# Patient Record
Sex: Female | Born: 1965
Health system: Southern US, Community
[De-identification: ages and names within clinical notes are randomized; demographics above are authoritative.]

## PROBLEM LIST (undated history)

## (undated) DIAGNOSIS — F319 Bipolar disorder, unspecified: Secondary | ICD-10-CM

## (undated) DIAGNOSIS — J449 Chronic obstructive pulmonary disease, unspecified: Secondary | ICD-10-CM

## (undated) DIAGNOSIS — M797 Fibromyalgia: Secondary | ICD-10-CM

## (undated) DIAGNOSIS — I10 Essential (primary) hypertension: Secondary | ICD-10-CM

## (undated) HISTORY — PX: BREAST BIOPSY: SHX20

## (undated) HISTORY — PX: HERNIA REPAIR: SHX51

---

## 2011-11-01 ENCOUNTER — Encounter (HOSPITAL_COMMUNITY): Payer: Self-pay | Admitting: *Deleted

## 2011-11-01 ENCOUNTER — Emergency Department (HOSPITAL_COMMUNITY)
Admission: EM | Admit: 2011-11-01 | Discharge: 2011-11-01 | Disposition: A | Discharge status: Home / Self Care | Payer: Medicaid Other | Attending: Emergency Medicine | Admitting: Emergency Medicine

## 2011-11-01 DIAGNOSIS — F17200 Nicotine dependence, unspecified, uncomplicated: Secondary | ICD-10-CM | POA: Insufficient documentation

## 2011-11-01 DIAGNOSIS — I10 Essential (primary) hypertension: Secondary | ICD-10-CM | POA: Insufficient documentation

## 2011-11-01 DIAGNOSIS — R609 Edema, unspecified: Secondary | ICD-10-CM | POA: Insufficient documentation

## 2011-11-01 DIAGNOSIS — E119 Type 2 diabetes mellitus without complications: Secondary | ICD-10-CM | POA: Insufficient documentation

## 2011-11-01 DIAGNOSIS — Z79899 Other long term (current) drug therapy: Secondary | ICD-10-CM | POA: Insufficient documentation

## 2011-11-01 HISTORY — DX: Essential (primary) hypertension: I10

## 2011-11-01 MED ORDER — LISINOPRIL 10 MG PO TABS
10.0000 mg | ORAL_TABLET | Freq: Once | ORAL | Status: AC
  Administered 2011-11-01: 10 mg via ORAL
  Filled 2011-11-01 (×2): qty 1

## 2011-11-01 MED ORDER — LISINOPRIL 10 MG PO TABS: 10.0000 mg | ORAL_TABLET | Freq: Every day | ORAL | Status: DC

## 2011-11-01 NOTE — ED Notes (Signed)
She had her bp checked at the fire dept and it was high.  She is supposed to take bp med but she cannot afford it and she ahs not had any med since the first of may

## 2011-11-01 NOTE — ED Provider Notes (Signed)
History     CSN: [id number]  Arrival date & time 11/01/11  0059   First MD Initiated Contact with Patient 11/01/11 0158      Chief Complaint  Patient presents with  . Hypertension    (Consider location/radiation/quality/duration/timing/severity/associated sxs/prior treatment) HPI  Patient who states hx of dm II and HTN but who has not been on her HTN medication this month stating "I had to get my diabetes medicine and couldn't afford both this month" presents to ER complaining that she noticed some lower ankle swelling his evening and called the on call nurse for her PCP who learned that she had been off of her BP medication and encouraged her to go to fire station to have BP checked. Patient states at fire station BP was 170/100 and then was told to go to ER. Patient denies any redness or heat of ankles. She denies fevers, chills, CP, SOB, HA, dizziness, changes in urination, abdominal pain or loss of coordination. She states she has prescription of lisinopril but just could not get it filled last month but now has the money to get it filled. She has no other complaints.   Past Medical History  Diagnosis Date  . Hypertension   . Diabetes mellitus     History reviewed. No pertinent past surgical history.  No family history on file.  History  Substance Use Topics  . Smoking status: Current Everyday Smoker  . Smokeless tobacco: Not on file  . Alcohol Use: No    OB History    Grav Para Term Preterm Abortions TAB SAB Ect Mult Living                  Review of Systems  All other systems reviewed and are negative.    Allergies  Latex  Home Medications   Current Outpatient Rx  Name Route Sig Dispense Refill  . ARIPIPRAZOLE 10 MG PO TABS Oral Take 10 mg by mouth daily.    Marland Kitchen LAMOTRIGINE 100 MG PO TABS Oral Take 100 mg by mouth daily.    Marland Kitchen METFORMIN HCL 500 MG PO TABS Oral Take 500 mg by mouth 2 (two) times daily with a meal.    . AMARYL PO Oral Take by mouth.    Marland Kitchen  LISINOPRIL 10 MG PO TABS Oral Take 1 tablet (10 mg total) by mouth daily. 30 tablet 1  . LISINOPRIL PO Oral Take by mouth.      BP 145/95  Pulse 86  Temp(Src) 98.7 F (37.1 C) (Oral)  Resp 18  SpO2 99%  Physical Exam  Nursing note and vitals reviewed. Constitutional: She is oriented to person, place, and time. She appears well-developed and well-nourished. No distress.  HENT:  Head: Normocephalic and atraumatic.  Eyes: Conjunctivae and EOM are normal. Pupils are equal, round, and reactive to light.  Neck: Normal range of motion. Neck supple.  Cardiovascular: Normal rate, regular rhythm, normal heart sounds and intact distal pulses.  Exam reveals no gallop and no friction rub.   No murmur heard. Pulmonary/Chest: Effort normal and breath sounds normal. No respiratory distress. She has no wheezes. She has no rales. She exhibits no tenderness.  Abdominal: Bowel sounds are normal. She exhibits no distension and no mass. There is no tenderness. There is no rebound and no guarding.  Musculoskeletal: Normal range of motion. She exhibits edema. She exhibits no tenderness.       Trace edema of bilateral ankles and feet but no erythema or break in skin.  No TTP of bilateral calfs  Neurological: She is alert and oriented to person, place, and time.  Skin: Skin is warm and dry. No rash noted. She is not diaphoretic. No erythema.  Psychiatric: She has a normal mood and affect.    ED Course  Procedures (including critical care time)  Labs Reviewed - No data to display No results found.   1. Hypertension   2. Peripheral edema       MDM  Mild peripheral edema with no complaints of CP or SOB. No worrisome signs or symptoms for DVT. No suggestion of HTN urgency or emergency. She has an established PCP to follow up with        Drucie Opitz, PA 11/01/11 0354

## 2011-11-01 NOTE — ED Provider Notes (Signed)
Medical screening examination/treatment/procedure(s) were performed by non-physician practitioner and as supervising physician I was immediately available for consultation/collaboration.    Vida Roller, MD 11/01/11 [phone]

## 2011-11-01 NOTE — Discharge Instructions (Signed)
Restart your lisinopril and keep a blood pressure diary to take to your primary care provider in the next 1-2 weeks to further assess your blood pressure management. Elevate you feet/ankles at night to help reduce swelling. You may also consider compression stockings for additional relief. Return to the ER for emergent changing or worsening of symptoms.   How to Take Your Blood Pressure  These instructions are only for electronic home blood pressure machines. You will need:   An automatic or semi-automatic blood pressure machine.   Fresh batteries for the blood pressure machine.  HOW DO I USE THESE TOOLS TO CHECK MY BLOOD PRESSURE?   There are 2 numbers that make up your blood pressure. For example: 120/80.   The first number (120 in our example) is called the "systolic pressure." It is a measure of the pressure in your blood vessels when your heart is pumping blood.   The second number (80 in our example) is called the "diastolic pressure." It is a measure of the pressure in your blood vessels when your heart is resting between beats.   Before you buy a home blood pressure machine, check the size of your arm so you can buy the right size cuff. Here is how to check the size of your arm:   Use a tape measure that shows both inches and centimeters.   Wrap the tape measure around the middle upper part of your arm. You may need someone to help you measure right.   Write down your arm measurement in both inches and centimeters.   To measure your blood pressure right, it is important to have the right size cuff.   If your arm is up to 13 inches (37 to 34 centimeters), get an adult cuff size.   If your arm is 13 to 17 inches (35 to 44 centimeters), get a large adult cuff size.   If your arm is 17 to 20 inches (45 to 52 centimeters), get an adult thigh cuff.   Try to rest or relax for at least 30 minutes before you check your blood pressure.   Do not smoke.   Do not have any drinks with  caffeine, such as:   Pop.   Coffee.   Tea.   Check your blood pressure in a quiet room.   Sit down and stretch out your arm on a table. Keep your arm at about the level of your heart. Let your arm relax.  GETTING BLOOD PRESSURE READINGS  Make sure you remove any tight-fighting clothing from your arm. Wrap the cuff around your upper arm. Wrap it just above the bend, and above where you felt the pulse. You should be able to slip a finger between the cuff and your arm. If you cannot slip a finger in the cuff, it is too tight and should be removed and rewrapped.   Some units requires you to manually pump up the arm cuff.   Automatic units inflate the cuff when you press a button.   Cuff deflation is automatic in both models.   After the cuff is inflated, the unit measures your blood pressure and pulse. The readings are displayed on a monitor. Hold still and breathe normally while the cuff is inflated.   Getting a reading takes less than a minute.   Some models store readings in a memory. Some provide a printout of readings.   Get readings at different times of the day. You should wait at least 5 minutes  between readings. Take readings with you to your next doctor's visit.  Document Released: 04/30/2008 Document Revised: 05/07/2011 Document Reviewed: 04/30/2008 [facility] Patient Information 2012 [place], [place].  Edema Edema is a buildup of fluids. It is most common in the feet, ankles, and legs. This happens more as a person ages. It may affect one or both legs. HOME CARE   Raise (elevate) the legs or ankles above the level of the heart while lying down.   Avoid sitting or standing still for a long time.   Exercise the legs to help the puffiness (swelling) go down.   A low-salt diet may help lessen the puffiness.   Only take medicine as told by your doctor.  GET HELP RIGHT AWAY IF:   You develop shortness of breath or chest pain.   You cannot breathe when you lie down.    You have more puffiness that does not go away with treatment.   You develop pain or redness in the areas that are puffy.   You have a temperature by mouth above 102 F (38.9 C), not controlled by medicine.   You gain 3 lb/1.4 kg or more in 1 day or 5 lb/2.3 kg in a week.  MAKE SURE YOU:   Understand these instructions.   Will watch your condition.   Will get help right away if you are not doing well or get worse.  Document Released: 11/04/2007 Document Revised: 05/07/2011 Document Reviewed: 11/04/2007 [facility] Patient Information 2012 [place], [place].

## 2012-03-01 ENCOUNTER — Encounter (HOSPITAL_COMMUNITY): Payer: Self-pay | Admitting: Emergency Medicine

## 2012-03-01 ENCOUNTER — Emergency Department (HOSPITAL_COMMUNITY)
Admission: EM | Admit: 2012-03-01 | Discharge: 2012-03-02 | Disposition: A | Discharge status: Home / Self Care | Payer: Medicaid Other | Attending: Emergency Medicine | Admitting: Emergency Medicine

## 2012-03-01 DIAGNOSIS — Z91040 Latex allergy status: Secondary | ICD-10-CM | POA: Insufficient documentation

## 2012-03-01 DIAGNOSIS — F17200 Nicotine dependence, unspecified, uncomplicated: Secondary | ICD-10-CM | POA: Insufficient documentation

## 2012-03-01 DIAGNOSIS — I10 Essential (primary) hypertension: Secondary | ICD-10-CM | POA: Insufficient documentation

## 2012-03-01 DIAGNOSIS — R739 Hyperglycemia, unspecified: Secondary | ICD-10-CM

## 2012-03-01 DIAGNOSIS — E119 Type 2 diabetes mellitus without complications: Secondary | ICD-10-CM | POA: Insufficient documentation

## 2012-03-01 DIAGNOSIS — F319 Bipolar disorder, unspecified: Secondary | ICD-10-CM | POA: Insufficient documentation

## 2012-03-01 HISTORY — DX: Bipolar disorder, unspecified: F31.9

## 2012-03-01 LAB — GLUCOSE, CAPILLARY: Glucose-Capillary: 196 mg/dL — ABNORMAL HIGH (ref 70–99)

## 2012-03-01 NOTE — ED Notes (Signed)
Pt alert, arrives from home, c/o High BS, onset was this am, resp even unlabored, skin pwd, pt NIDDM,

## 2012-03-02 LAB — URINALYSIS, ROUTINE W REFLEX MICROSCOPIC
Bilirubin Urine: NEGATIVE
Glucose, UA: NEGATIVE mg/dL
Hgb urine dipstick: NEGATIVE
Ketones, ur: NEGATIVE mg/dL
Leukocytes, UA: NEGATIVE
Nitrite: NEGATIVE
Protein, ur: NEGATIVE mg/dL
Specific Gravity, Urine: 1.018 (ref 1.005–1.030)
Urobilinogen, UA: 0.2 mg/dL (ref 0.0–1.0)
pH: 5.5 (ref 5.0–8.0)

## 2012-03-02 LAB — GLUCOSE, CAPILLARY: Glucose-Capillary: 145 mg/dL — ABNORMAL HIGH (ref 70–99)

## 2012-03-02 LAB — PREGNANCY, URINE: Preg Test, Ur: NEGATIVE

## 2012-03-02 NOTE — ED Notes (Signed)
Pt returns to ED, requesting to be seen, pt undischarged

## 2012-03-02 NOTE — ED Provider Notes (Signed)
History     CSN: [id number]  Arrival date & time 03/01/12  2137   First MD Initiated Contact with Patient 03/02/12 0144      Chief Complaint  Patient presents with  . Hyperglycemia    (Consider location/radiation/quality/duration/timing/severity/associated sxs/prior treatment) HPI Comments: The patient is a known diabetic who currently takes metformin and Amaryl for blood sugar, states has had blood sugars in the 150s but today became lightheaded and dizzy, check her blood sugar was over 330. She states her symptoms have resolved, she has no other complaints including no chest pain, cough, palpitations, shortness of breath, dysuria, diarrhea, rashes, headache, blurred vision, numbness or weakness. Symptoms resolve spontaneously  The history is provided by the patient and a relative.    Past Medical History  Diagnosis Date  . Hypertension   . Diabetes mellitus   . Bipolar 1 disorder     Past Surgical History  Procedure Date  . Cesarean section   . Hernia repair     No family history on file.  History  Substance Use Topics  . Smoking status: Current Every Day Smoker  . Smokeless tobacco: Not on file  . Alcohol Use: No    OB History    Grav Para Term Preterm Abortions TAB SAB Ect Mult Living                  Review of Systems  All other systems reviewed and are negative.    Allergies  Latex  Home Medications   Current Outpatient Rx  Name Route Sig Dispense Refill  . ARIPIPRAZOLE 10 MG PO TABS Oral Take 10 mg by mouth daily.    . AMARYL PO Oral Take 4 mg by mouth daily.     Marland Kitchen LAMOTRIGINE 100 MG PO TABS Oral Take 100 mg by mouth daily.    Marland Kitchen LISINOPRIL 10 MG PO TABS Oral Take 1 tablet (10 mg total) by mouth daily. 30 tablet 1  . METFORMIN HCL 500 MG PO TABS Oral Take 1,000 mg by mouth 2 (two) times daily with a meal.     . PIOGLITAZONE HCL 30 MG PO TABS Oral Take 30 mg by mouth daily.      BP 154/73  Pulse 92  Temp 98.1 F (36.7 C) (Oral)  Resp 20   Ht 5\' 3"  (1.6 m)  Wt 208 lb (94.348 kg)  BMI 36.85 kg/m2  SpO2 99%  LMP 02/13/2012  Physical Exam  Nursing note and vitals reviewed. Constitutional: She appears well-developed and well-nourished. No distress.  HENT:  Head: Normocephalic and atraumatic.  Mouth/Throat: Oropharynx is clear and moist. No oropharyngeal exudate.  Eyes: Conjunctivae normal and EOM are normal. Pupils are equal, round, and reactive to light. Right eye exhibits no discharge. Left eye exhibits no discharge. No scleral icterus.  Neck: Normal range of motion. Neck supple. No JVD present. No thyromegaly present.  Cardiovascular: Normal rate, regular rhythm, normal heart sounds and intact distal pulses.  Exam reveals no gallop and no friction rub.   No murmur heard. Pulmonary/Chest: Effort normal and breath sounds normal. No respiratory distress. She has no wheezes. She has no rales.  Abdominal: Soft. Bowel sounds are normal. She exhibits no distension and no mass. There is no tenderness.  Musculoskeletal: Normal range of motion. She exhibits no edema and no tenderness.  Lymphadenopathy:    She has no cervical adenopathy.  Neurological: She is alert. Coordination normal.       Strength of all 4 extremities, cranial  nerves III through XII intact, sensation to light-touch intact. Each is clear, coordination is intact in all 4 extremities, no truncal ataxia  Skin: Skin is warm and dry. No rash noted. No erythema.  Psychiatric: She has a normal mood and affect. Her behavior is normal.    ED Course  Procedures (including critical care time)  Labs Reviewed  GLUCOSE, CAPILLARY - Abnormal; Notable for the following:    Glucose-Capillary 196 (*)     All other components within normal limits  GLUCOSE, CAPILLARY - Abnormal; Notable for the following:    Glucose-Capillary 145 (*)     All other components within normal limits  URINALYSIS, ROUTINE W REFLEX MICROSCOPIC  PREGNANCY, URINE   No results found.   1.  Hyperglycemia       MDM  Well-appearing, blood sugar is reduced to less than 200, vital signs are normal, check urinalysis for occult infection or pregnancy.  Blood sugar less than 200, urinalysis clean, patient asymptomatic with normal vital signs are stable for discharge.      Vida Roller, MD 03/02/12 Earle Gell

## 2012-06-27 ENCOUNTER — Other Ambulatory Visit: Payer: Self-pay | Admitting: Obstetrics and Gynecology

## 2012-06-27 DIAGNOSIS — Z1231 Encounter for screening mammogram for malignant neoplasm of breast: Secondary | ICD-10-CM

## 2012-07-27 ENCOUNTER — Ambulatory Visit: Payer: Medicaid Other

## 2012-08-02 ENCOUNTER — Ambulatory Visit: Payer: Medicaid Other

## 2012-08-25 ENCOUNTER — Ambulatory Visit
Admission: RE | Admit: 2012-08-25 | Discharge: 2012-08-25 | Disposition: A | Discharge status: Home / Self Care | Payer: Medicare Other | Source: Ambulatory Visit | Attending: Obstetrics and Gynecology | Admitting: Obstetrics and Gynecology

## 2012-08-25 ENCOUNTER — Other Ambulatory Visit: Payer: Self-pay | Admitting: Family Medicine

## 2012-08-25 DIAGNOSIS — Z1231 Encounter for screening mammogram for malignant neoplasm of breast: Secondary | ICD-10-CM

## 2012-08-29 ENCOUNTER — Other Ambulatory Visit: Payer: Self-pay | Admitting: Family Medicine

## 2012-08-29 DIAGNOSIS — R928 Other abnormal and inconclusive findings on diagnostic imaging of breast: Secondary | ICD-10-CM

## 2012-09-07 ENCOUNTER — Ambulatory Visit
Admission: RE | Admit: 2012-09-07 | Discharge: 2012-09-07 | Disposition: A | Discharge status: Home / Self Care | Payer: Medicare Other | Source: Ambulatory Visit | Attending: Family Medicine | Admitting: Family Medicine

## 2012-09-07 ENCOUNTER — Other Ambulatory Visit: Payer: Self-pay | Admitting: Family Medicine

## 2012-09-07 DIAGNOSIS — R928 Other abnormal and inconclusive findings on diagnostic imaging of breast: Secondary | ICD-10-CM

## 2012-12-02 ENCOUNTER — Encounter (HOSPITAL_COMMUNITY): Payer: Self-pay | Admitting: Emergency Medicine

## 2012-12-02 ENCOUNTER — Emergency Department (HOSPITAL_COMMUNITY)
Admission: EM | Admit: 2012-12-02 | Discharge: 2012-12-02 | Disposition: A | Discharge status: Home / Self Care | Payer: Medicare Other | Attending: Emergency Medicine | Admitting: Emergency Medicine

## 2012-12-02 DIAGNOSIS — T4275XA Adverse effect of unspecified antiepileptic and sedative-hypnotic drugs, initial encounter: Secondary | ICD-10-CM | POA: Insufficient documentation

## 2012-12-02 DIAGNOSIS — E119 Type 2 diabetes mellitus without complications: Secondary | ICD-10-CM | POA: Insufficient documentation

## 2012-12-02 DIAGNOSIS — T426X1A Poisoning by other antiepileptic and sedative-hypnotic drugs, accidental (unintentional), initial encounter: Secondary | ICD-10-CM | POA: Insufficient documentation

## 2012-12-02 DIAGNOSIS — F17200 Nicotine dependence, unspecified, uncomplicated: Secondary | ICD-10-CM | POA: Insufficient documentation

## 2012-12-02 DIAGNOSIS — F319 Bipolar disorder, unspecified: Secondary | ICD-10-CM | POA: Insufficient documentation

## 2012-12-02 DIAGNOSIS — I10 Essential (primary) hypertension: Secondary | ICD-10-CM | POA: Insufficient documentation

## 2012-12-02 DIAGNOSIS — Z79899 Other long term (current) drug therapy: Secondary | ICD-10-CM | POA: Insufficient documentation

## 2012-12-02 DIAGNOSIS — R197 Diarrhea, unspecified: Secondary | ICD-10-CM | POA: Insufficient documentation

## 2012-12-02 MED ORDER — FAMOTIDINE 20 MG PO TABS
10.0000 mg | ORAL_TABLET | Freq: Once | ORAL | Status: DC
  Filled 2012-12-02: qty 1

## 2012-12-02 MED ORDER — FAMOTIDINE 10 MG PO TABS: 10.0000 mg | ORAL_TABLET | Freq: Once | ORAL | Status: DC

## 2012-12-02 MED ORDER — PREDNISONE 20 MG PO TABS: 40.0000 mg | ORAL_TABLET | Freq: Once | ORAL | Status: DC

## 2012-12-02 MED ORDER — FAMOTIDINE 20 MG PO TABS
20.0000 mg | ORAL_TABLET | Freq: Once | ORAL | Status: AC
  Administered 2012-12-02: 20 mg via ORAL

## 2012-12-02 MED ORDER — LORATADINE 10 MG PO TABS: 10.0000 mg | ORAL_TABLET | Freq: Once | ORAL | Status: DC

## 2012-12-02 MED ORDER — PREDNISONE 20 MG PO TABS
40.0000 mg | ORAL_TABLET | Freq: Once | ORAL | Status: AC
  Administered 2012-12-02: 40 mg via ORAL
  Filled 2012-12-02: qty 2

## 2012-12-02 MED ORDER — PREDNISONE 20 MG PO TABS: 40.0000 mg | ORAL_TABLET | Freq: Every day | ORAL | Status: DC

## 2012-12-02 MED ORDER — LORATADINE 10 MG PO TABS: 10.0000 mg | ORAL_TABLET | Freq: Every day | ORAL | Status: DC

## 2012-12-02 MED ORDER — FAMOTIDINE 20 MG PO TABS: 20.0000 mg | ORAL_TABLET | Freq: Every day | ORAL | Status: DC

## 2012-12-02 MED ORDER — LORATADINE 10 MG PO TABS
10.0000 mg | ORAL_TABLET | Freq: Once | ORAL | Status: AC
  Administered 2012-12-02: 10 mg via ORAL
  Filled 2012-12-02: qty 1

## 2012-12-02 NOTE — ED Notes (Signed)
Pt c/o rash started month ago.rash burns and itches.VSS

## 2012-12-02 NOTE — ED Provider Notes (Signed)
History  This chart was scribed for non-physician practitioner working with Courtney Chick, MD by Courtney Bennett, ED scribe. This patient was seen in room WTR7/WTR7 and the patient's care was started at 4:36 PM.  CSN: [id number] Arrival date & time 12/02/12  1515   Chief Complaint  Patient presents with  . Rash   The history is provided by the patient. No language interpreter was used.    HPI Comments: Courtney Bennett is a 47 y.o. female who presents to the Emergency Department complaining of gradual onset, constant rash that started one month ago. Pt states it itches and burns. Pt states the rash started on her lower abdomen and moved up to her chest. She states she saw her psychiatrist at [facility] and she told her it was a rash from her Lamictal. Pt states she has had some diarrhea x 1 episode today. Pt denies trouble breathing, troubling swallowing, nausea, emesis, and fever as associated symptoms. She states she checks her sugars everyday. Pt states they are around 120.   Past Medical History  Diagnosis Date  . Hypertension   . Diabetes mellitus   . Bipolar 1 disorder    Past Surgical History  Procedure Laterality Date  . Cesarean section    . Hernia repair     No family history on file. History  Substance Use Topics  . Smoking status: Current Every Day Smoker  . Smokeless tobacco: Not on file  . Alcohol Use: No   OB History   Grav Para Term Preterm Abortions TAB SAB Ect Mult Living                 Review of Systems  Constitutional: Negative for fever.  HENT: Negative for trouble swallowing.   Gastrointestinal: Positive for diarrhea. Negative for nausea and vomiting.  Skin: Positive for rash.  All other systems reviewed and are negative.    Allergies  Latex  Home Medications   Current Outpatient Rx  Name  Route  Sig  Dispense  Refill  . ARIPiprazole (ABILIFY) 10 MG tablet   Oral   Take 10 mg by mouth daily.         . diphenhydrAMINE (BENADRYL) 25 mg  capsule   Oral   Take 25 mg by mouth every 6 (six) hours as needed for itching.         Marland Kitchen glimepiride (AMARYL) 4 MG tablet   Oral   Take 8 mg by mouth daily before breakfast.         . lisinopril (PRINIVIL,ZESTRIL) 10 MG tablet   Oral   Take 10 mg by mouth daily.         . metFORMIN (GLUCOPHAGE) 500 MG tablet   Oral   Take 500-750 mg by mouth 2 (two) times daily with a meal. 1.5 tab in am, 1 tab in pm         . pioglitazone (ACTOS) 45 MG tablet   Oral   Take 45 mg by mouth daily.         . famotidine (PEPCID) 10 MG tablet   Oral   Take 1 tablet (10 mg total) by mouth once.   7 tablet   0   . loratadine (CLARITIN) 10 MG tablet   Oral   Take 1 tablet (10 mg total) by mouth once.   14 tablet   0   . predniSONE (DELTASONE) 20 MG tablet   Oral   Take 2 tablets (40 mg total) by mouth  once.   10 tablet   0    BP 132/72  Pulse 102  Temp(Src) 98.1 F (36.7 C)  Resp 16  SpO2 96%  Physical Exam  Nursing note and vitals reviewed. Constitutional: She is oriented to person, place, and time. She appears well-developed and well-nourished. No distress.  HENT:  Head: Normocephalic and atraumatic.  Eyes: EOM are normal.  Neck: Neck supple. No tracheal deviation present.  Cardiovascular: Normal rate.   Pulmonary/Chest: Effort normal. No respiratory distress.  Musculoskeletal: Normal range of motion.  Neurological: She is alert and oriented to person, place, and time.  Skin: Skin is warm and dry. Rash noted. Rash is urticarial.     Psychiatric: She has a normal mood and affect. Her behavior is normal.    ED Course  Procedures (including critical care time)  DIAGNOSTIC STUDIES: Oxygen Saturation is 96% on RA, normal by my interpretation.    COORDINATION OF CARE: 4:49 PM-Discussed treatment plan which includes prednisone, Benadryl for itching, pepcid, and Claritin with pt at bedside and pt agreed to plan. Advised pt prednisone might make her sugar levels  increase some and to dc if that happens. She is to check her sugar TID. If symptoms resolve and return she needs to be seen again. If it does not help rash then she needs to be seen again. Pt has dx Lamictal and been off of it for 4 days now.  Labs Reviewed - No data to display No results found. 1. Allergy to pain medication, initial encounter     MDM   47 y.o.Courtney Bennett's evaluation in the Emergency Department is complete. It has been determined that no acute conditions requiring further emergency intervention are present at this time. The patient/guardian have been advised of the diagnosis and plan. We have discussed signs and symptoms that warrant return to the ED, such as changes or worsening in symptoms.  Vital signs are stable at discharge. Filed Vitals:   12/02/12 1539  BP: 132/72  Pulse: 102  Temp: 98.1 F (36.7 C)  Resp: 16    Patient/guardian has voiced understanding and agreed to follow-up with the PCP or specialist.  I personally performed the services described in this documentation, which was scribed in my presence. The recorded information has been reviewed and is accurate.   Dorthula Matas, PA-C 12/02/12 1700

## 2012-12-02 NOTE — ED Provider Notes (Signed)
Medical screening examination/treatment/procedure(s) were performed by non-physician practitioner and as supervising physician I was immediately available for consultation/collaboration.  Ethelda Chick, MD 12/02/12 [phone]

## 2012-12-02 NOTE — ED Notes (Signed)
Pt states she noticed the rash start about a month ago but "it wasn't so bad."  About a week ago it got worse so she went to her psychiatrist who took her off her lamictal saying it was d/t it.  Pt states the rash has worsened, itches, burns and OTC creams aren't helping.  States she had a bit of an upset stomach this am but otherwise no other sx's.

## 2013-02-07 ENCOUNTER — Other Ambulatory Visit: Payer: Self-pay | Admitting: Family Medicine

## 2013-02-07 DIAGNOSIS — N6489 Other specified disorders of breast: Secondary | ICD-10-CM

## 2013-03-09 ENCOUNTER — Ambulatory Visit
Admission: RE | Admit: 2013-03-09 | Discharge: 2013-03-09 | Disposition: A | Discharge status: Home / Self Care | Payer: Medicare Other | Source: Ambulatory Visit | Attending: Family Medicine | Admitting: Family Medicine

## 2013-03-09 DIAGNOSIS — N6489 Other specified disorders of breast: Secondary | ICD-10-CM

## 2013-09-04 ENCOUNTER — Other Ambulatory Visit: Payer: Self-pay | Admitting: Obstetrics and Gynecology

## 2013-09-04 DIAGNOSIS — N6489 Other specified disorders of breast: Secondary | ICD-10-CM

## 2013-09-06 ENCOUNTER — Other Ambulatory Visit: Payer: Self-pay | Admitting: Physician Assistant

## 2013-09-06 DIAGNOSIS — N6489 Other specified disorders of breast: Secondary | ICD-10-CM

## 2013-09-13 ENCOUNTER — Ambulatory Visit
Admission: RE | Admit: 2013-09-13 | Discharge: 2013-09-13 | Disposition: A | Discharge status: Home / Self Care | Payer: Medicare Other | Source: Ambulatory Visit | Attending: Obstetrics and Gynecology | Admitting: Obstetrics and Gynecology

## 2013-09-13 DIAGNOSIS — N6489 Other specified disorders of breast: Secondary | ICD-10-CM

## 2014-03-07 ENCOUNTER — Other Ambulatory Visit: Payer: Self-pay | Admitting: Family Medicine

## 2014-03-07 DIAGNOSIS — N6489 Other specified disorders of breast: Secondary | ICD-10-CM

## 2014-03-19 ENCOUNTER — Encounter (INDEPENDENT_AMBULATORY_CARE_PROVIDER_SITE_OTHER): Payer: Self-pay

## 2014-03-19 ENCOUNTER — Ambulatory Visit
Admission: RE | Admit: 2014-03-19 | Discharge: 2014-03-19 | Disposition: A | Discharge status: Home / Self Care | Payer: Medicare Other | Source: Ambulatory Visit | Attending: Family Medicine | Admitting: Family Medicine

## 2014-03-19 DIAGNOSIS — N6489 Other specified disorders of breast: Secondary | ICD-10-CM

## 2014-08-08 ENCOUNTER — Encounter (HOSPITAL_COMMUNITY): Payer: Self-pay | Admitting: Emergency Medicine

## 2014-08-08 ENCOUNTER — Emergency Department (HOSPITAL_COMMUNITY)
Admission: EM | Admit: 2014-08-08 | Discharge: 2014-08-08 | Disposition: A | Discharge status: Home / Self Care | Payer: Medicare Other | Attending: Emergency Medicine | Admitting: Emergency Medicine

## 2014-08-08 DIAGNOSIS — Z8739 Personal history of other diseases of the musculoskeletal system and connective tissue: Secondary | ICD-10-CM | POA: Insufficient documentation

## 2014-08-08 DIAGNOSIS — Z79899 Other long term (current) drug therapy: Secondary | ICD-10-CM | POA: Diagnosis not present

## 2014-08-08 DIAGNOSIS — F419 Anxiety disorder, unspecified: Secondary | ICD-10-CM | POA: Diagnosis not present

## 2014-08-08 DIAGNOSIS — E119 Type 2 diabetes mellitus without complications: Secondary | ICD-10-CM | POA: Diagnosis not present

## 2014-08-08 DIAGNOSIS — I10 Essential (primary) hypertension: Secondary | ICD-10-CM | POA: Diagnosis not present

## 2014-08-08 DIAGNOSIS — G4700 Insomnia, unspecified: Secondary | ICD-10-CM | POA: Insufficient documentation

## 2014-08-08 DIAGNOSIS — Z720 Tobacco use: Secondary | ICD-10-CM | POA: Insufficient documentation

## 2014-08-08 DIAGNOSIS — F319 Bipolar disorder, unspecified: Secondary | ICD-10-CM | POA: Insufficient documentation

## 2014-08-08 DIAGNOSIS — Z91040 Latex allergy status: Secondary | ICD-10-CM | POA: Insufficient documentation

## 2014-08-08 DIAGNOSIS — Z7952 Long term (current) use of systemic steroids: Secondary | ICD-10-CM | POA: Insufficient documentation

## 2014-08-08 HISTORY — DX: Fibromyalgia: M79.7

## 2014-08-08 MED ORDER — DIPHENHYDRAMINE HCL 25 MG PO CAPS: 25.0000 mg | ORAL_CAPSULE | Freq: Four times a day (QID) | ORAL | Status: DC | PRN
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
## 2014-08-08 NOTE — ED Notes (Signed)
Sheriff's department is coming to take patient home.

## 2014-08-08 NOTE — Discharge Instructions (Signed)
°Insomnia °Insomnia is frequent trouble falling and/or staying asleep. Insomnia can be a long term problem or a short term problem. Both are common. Insomnia can be a short term problem when the wakefulness is related to a certain stress or worry. Long term insomnia is often related to ongoing stress during waking hours and/or poor sleeping habits. Overtime, sleep deprivation itself can make the problem worse. Every little thing feels more severe because you are overtired and your ability to cope is decreased. °CAUSES  °· Stress, anxiety, and depression. °· Poor sleeping habits. °· Distractions such as TV in the bedroom. °· Naps close to bedtime. °· Engaging in emotionally charged conversations before bed. °· Technical reading before sleep. °· Alcohol and other sedatives. They may make the problem worse. They can hurt normal sleep patterns and normal dream activity. °· Stimulants such as caffeine for several hours prior to bedtime. °· Pain syndromes and shortness of breath can cause insomnia. °· Exercise late at night. °· Changing time zones may cause sleeping problems (jet lag). °It is sometimes helpful to have someone observe your sleeping patterns. They should look for periods of not breathing during the night (sleep apnea). They should also look to see how long those periods last. If you live alone or observers are uncertain, you can also be observed at a sleep clinic where your sleep patterns will be professionally monitored. Sleep apnea requires a checkup and treatment. Give your caregivers your medical history. Give your caregivers observations your family has made about your sleep.  °SYMPTOMS  °· Not feeling rested in the morning. °· Anxiety and restlessness at bedtime. °· Difficulty falling and staying asleep. °TREATMENT  °· Your caregiver may prescribe treatment for an underlying medical disorders. Your caregiver can give advice or help if you are using alcohol or other drugs for self-medication.  Treatment of underlying problems will usually eliminate insomnia problems. °· Medications can be prescribed for short time use. They are generally not recommended for lengthy use. °· Over-the-counter sleep medicines are not recommended for lengthy use. They can be habit forming. °· You can promote easier sleeping by making lifestyle changes such as: °¨ Using relaxation techniques that help with breathing and reduce muscle tension. °¨ Exercising earlier in the day. °¨ Changing your diet and the time of your last meal. No night time snacks. °¨ Establish a regular time to go to bed. °· Counseling can help with stressful problems and worry. °· Soothing music and white noise may be helpful if there are background noises you cannot remove. °· Stop tedious detailed work at least one hour before bedtime. °HOME CARE INSTRUCTIONS  °· Keep a diary. Inform your caregiver about your progress. This includes any medication side effects. See your caregiver regularly. Take note of: °¨ Times when you are asleep. °¨ Times when you are awake during the night. °¨ The quality of your sleep. °¨ How you feel the next day. °This information will help your caregiver care for you. °· Get out of bed if you are still awake after 15 minutes. Read or do some quiet activity. Keep the lights down. Wait until you feel sleepy and go back to bed. °· Keep regular sleeping and waking hours. Avoid naps. °· Exercise regularly. °· Avoid distractions at bedtime. Distractions include watching television or engaging in any intense or detailed activity like attempting to balance the household checkbook. °· Develop a bedtime ritual. Keep a familiar routine of bathing, brushing your teeth, climbing into bed at the same   time each night, listening to soothing music. Routines increase the success of falling to sleep faster.  Use relaxation techniques. This can be using breathing and muscle tension release routines. It can also include visualizing peaceful scenes. You can  also help control troubling or intruding thoughts by keeping your mind occupied with boring or repetitive thoughts like the old concept of counting sheep. You can make it more creative like imagining planting one beautiful flower after another in your backyard garden.  During your day, work to eliminate stress. When this is not possible use some of the previous suggestions to help reduce the anxiety that accompanies stressful situations. MAKE SURE YOU:   Understand these instructions.  Will watch your condition.  Will get help right away if you are not doing well or get worse. Document Released: 05/15/2000 Document Revised: 08/10/2011 Document Reviewed: 06/15/2007 [facility] Patient Information 2015 [place], [place]. This information is not intended to replace advice given to you by your health care provider. Make sure you discuss any questions you have with your health care provider.   Emergency Department Resource Guide 1) Find a Doctor and Pay Out of Pocket Although you won't have to find out who is covered by your insurance plan, it is a good idea to ask around and get recommendations. You will then need to call the office and see if the doctor you have chosen will accept you as a new patient and what types of options they offer for patients who are self-pay. Some doctors offer discounts or will set up payment plans for their patients who do not have insurance, but you will need to ask so you aren't surprised when you get to your appointment.  2) Contact Your Local Health Department Not all health departments have doctors that can see patients for sick visits, but many do, so it is worth a call to see if yours does. If you don't know where your local health department is, you can check in your phone book. The CDC also has a tool to help you locate your state's health department, and many state websites also have listings of all of their local health departments.  3) Find a [facility] If  your illness is not likely to be very severe or complicated, you may want to try a walk in clinic. These are popping up all over the country in pharmacies, drugstores, and shopping centers. They're usually staffed by nurse practitioners or physician assistants that have been trained to treat common illnesses and complaints. They're usually fairly quick and inexpensive. However, if you have serious medical issues or chronic medical problems, these are probably not your best option.  No Primary Care Doctor: - Call Health Connect at  [phone] - they can help you locate a primary care doctor that  accepts your insurance, provides certain services, etc. - Physician Referral Service- [phone]  Chronic Pain Problems: Organization         Address  Phone   Notes  [facility]  [phone] Patients need to be referred by their primary care doctor.   Medication Assistance: Organization         Address  Phone   Notes  [facility] Medication [facility] [address] [place], [place] [address] [phone] --Must be a resident of [facility] -- Must have NO insurance coverage whatsoever (no Medicaid/ Medicare, etc.) -- The pt. MUST have a primary care doctor that directs their care regularly and follows them  in the community   Med[facility]ist  [phone]   [employer]  [phone]    Agencies that provide inexpensive medical care: Organization         Address  Phone   Notes  [place]  [phone]   [facility]    [phone]   [facility], [place] [address] [phone]   [place] [address] [place] [phone]   Planned Parenthood    [phone]   [facility]    [phone]   [place] and [place] [address], [place] Phone:  [phone], Fax:  [phone] Hours of  Operation:  9 am - 6 pm, M-F.  Also accepts Medicaid/Medicare and self-pay.  [facility] for [place], [address], [place] Phone: [phone], Fax: [phone]. Hours of Operation:  8:30 am - 5:30 pm, M-F.  Also accepts Medicaid and self-pay.  [facility] [address], [place] Phone: [phone]   [place], [place], [place] [phone], Ext. [phone] Mondays & Thursdays: 7-9 AM.  First 15 patients are seen on a first come, first serve basis.    [place] Providers:  Organization         Address  Phone   Notes  [facility] [address], [place] [phone] Also accepts self-pay patients.  [facility] [address], [place]  [phone]   [place], [address], [place] [phone]   [facility] [address], [place] [phone]   Lucianne Lei [address], [place]   [phone] Only accepts [place] Access [place] patients after they have their name applied to their card.   Self-Pay (no insurance) in [facility]:  Organization         Address  Phone   Notes  Sickle Cell Patients, [facility] Internal Medicine [place] [phone]   [facility] [place] [phone]   [facility] [place]  [place], [place], [place] [phone]   [facility]/Dr. Osei-Bonsu  [address], [place] or [address], [place] [phone] Phone number for both [place] and [place] locations is the same.  [facility]gent Medical [facility] [address], [place] [phone]   [facility] [address], [place] or [address] [phone]   [facility] [address], [place] [phone], phone; [phone], fax Sees patients 1st and 3rd Saturday of every month.  Must not qualify for public or private insurance (i.e. Medicaid, Medicare, [place] Health Choice, Veterans' Benefits)  Household income should be no more than 200% of the poverty level The clinic cannot treat you if you are pregnant or think you are pregnant  Sexually transmitted diseases are not treated at the clinic.    Dental Care: Organization         Address  Phone  Notes  [facility] Department of [facility] [address] [place], [place] [phone] Accepts children up to age 101 who are enrolled in  Medicaid or [place] Health Choice; pregnant women with a Medicaid card; and children who have applied for Medicaid or [place] Health Choice, but were declined, whose parents can pay a reduced fee at time of service.  [facility] Department of [facility]  [address], [place] [phone] Accepts children up to age 38 who are enrolled in [place] or [place]; pregnant women with a Medicaid card; and children who have applied for Medicaid or  Health Choice, but were declined, whose parents can pay a reduced fee at time of service.  [facility]  [place] [phone] Patients are seen by appointment only. Walk-ins are not accepted. [place] will see patients 52 years of age and older. Monday - Tuesday (8am-5pm) Most Wednesdays (8:30-5pm) $30 per visit, cash only  [facility] Adult Dental Access PROGRAM  [address], [facility] [phone] Patients are seen by appointment only. Walk-ins are not accepted. [place] will see patients 43 years of age and older. One Wednesday Evening (Monthly: Volunteer Based).  $30 per visit, cash only  [place]  [phone] for adults; Children under age 28, call Graduate Pediatric  Dentistry at [phone]. Children aged 6-14, please call [phone] to request a pediatric application.  Dental services are provided in all areas of dental care including fillings, crowns and bridges, complete and partial dentures, implants, gum treatment, root canals, and extractions. Preventive care is also provided. Treatment is provided to both adults and children. Patients are selected via a lottery and there is often a waiting list.   [facility] [address], [place]  [phone] [email or web address]   [facility] [address] [place], [place] [phone], Ext. [phone] Second and Fourth Thursday of each month, opens at 6:30 AM; Clinic ends at 9 AM.  Patients are seen on a first-come first-served basis, and a limited number are seen during each clinic.   [facility]  [address] Hillard Danker [place], [place] [phone]   Eligibility Requirements You must have lived in [place], [place], or [place] counties for at least the last three months.   You cannot be eligible for state or federal sponsored [employer], including [employer], [place], or [employer].   You generally cannot be eligible for healthcare insurance through your employer.    How to apply: Eligibility screenings are held every Tuesday and Wednesday afternoon from 1:00 pm until 4:00 pm. You do not need an appointment for the interview!  [facility] [address], [place], [place]   [place]  [place] Department  [place]  [phone]    Behavioral Health Resources in the Community: Intensive Outpatient Programs Organization         Address  Phone  Notes  [place]aria Realitos. [address], [place], [place] [phone]   [facility] Outpatient [address], [place], [place]   ADS:  [facility] Drug S[facility]cs [address], [place], [place]   [place] [address],  [place], [place] or [phone]   Substance Abuse Resources Organization         Address  Phone  Notes  Alcohol and Drug Services  [phone]   [place]  [phone]   [employer]  [phone]   Chinita Pester  [phone]   Residential & Outpatient Substance Abuse Program  [phone]   Psychological Services Organization         Address  Phone  Notes  [facility] [place]  [place]  [phone]   [place] [address], [place] or [phone]    Mobile Crisis Teams Organization         Address  Phone  Notes  [facility], Mobile Crisis Care Unit  [phone]   [facility]  [address] [place], [place]   [place] [address] [place] [phone]    Self-Help/Support Groups Organization         Address  Phone             Notes  [place]. of [place] - variety of support groups  [place] Call for more information  Narcotics Anonymous (NA), [facility] [address], [employer] [place]  2 meetings at this location   Special educational needs teacher         Address  Phone  Notes  [facility]AP Residential Treatment [place],    [place]  [phone]   [facility]  [address], [place] [address], [place], [place]   [place], [place] [phone] Admissions: 8am-3pm M-F  Incentives Substance [place] [address]    [place], [place] [phone]   [facility] [address] [place], [place], [place]   [facility] [address],  [place], [place]   [facility] - Intensive Outpatient [address] Kristeen [address],  [place], [place]   [facility] ([place].,  [place], [place] [phone] or [phone]   [facility] ([facility]) [address], [place], [place] Accepts Medicaid  [facility] [place] [address],  [place] [phone] Substance Abuse/Addiction Treatment   [facility] Organization         Address  Phone  Notes  [facility]  [phone]   Domenic Schwab, PhD [address] Arlis [place], [place]   [phone] or [phone]   [place], [place] [phone]   [facility] [address], [place], [place] [phone] Insurance/Medicaid/sponsorship through [facility] and Families [address] [place]                                    [place], [place] [phone] [place] [address], [place] [phone]    Dr. Adele Schilder  [phone]   [facility] Dept. 1) [address], [place] 2) [place] 3)  [place] [address], [place] [phone]  [phone]   [place] [phone] or [phone] (After Hours)

## 2014-08-10 ENCOUNTER — Emergency Department (HOSPITAL_COMMUNITY)
Admission: EM | Admit: 2014-08-10 | Discharge: 2014-08-11 | Disposition: A | Discharge status: Other Acute Inpatient Hospital | Payer: Medicare Other | Attending: Emergency Medicine | Admitting: Emergency Medicine

## 2014-08-10 ENCOUNTER — Encounter (HOSPITAL_COMMUNITY): Payer: Self-pay | Admitting: Emergency Medicine

## 2014-08-10 DIAGNOSIS — Z720 Tobacco use: Secondary | ICD-10-CM | POA: Diagnosis not present

## 2014-08-10 DIAGNOSIS — Z79899 Other long term (current) drug therapy: Secondary | ICD-10-CM | POA: Insufficient documentation

## 2014-08-10 DIAGNOSIS — F22 Delusional disorders: Secondary | ICD-10-CM | POA: Diagnosis not present

## 2014-08-10 DIAGNOSIS — E119 Type 2 diabetes mellitus without complications: Secondary | ICD-10-CM | POA: Diagnosis not present

## 2014-08-10 DIAGNOSIS — Z91040 Latex allergy status: Secondary | ICD-10-CM | POA: Diagnosis not present

## 2014-08-10 DIAGNOSIS — I10 Essential (primary) hypertension: Secondary | ICD-10-CM | POA: Diagnosis not present

## 2014-08-10 DIAGNOSIS — Z7952 Long term (current) use of systemic steroids: Secondary | ICD-10-CM | POA: Diagnosis not present

## 2014-08-10 DIAGNOSIS — M797 Fibromyalgia: Secondary | ICD-10-CM | POA: Insufficient documentation

## 2014-08-10 DIAGNOSIS — F310 Bipolar disorder, current episode hypomanic: Secondary | ICD-10-CM | POA: Diagnosis not present

## 2014-08-10 DIAGNOSIS — F1210 Cannabis abuse, uncomplicated: Secondary | ICD-10-CM | POA: Insufficient documentation

## 2014-08-10 DIAGNOSIS — Z046 Encounter for general psychiatric examination, requested by authority: Secondary | ICD-10-CM | POA: Diagnosis present

## 2014-08-10 LAB — CBG MONITORING, ED
Glucose-Capillary: 290 mg/dL — ABNORMAL HIGH (ref 70–99)
Glucose-Capillary: 61 mg/dL — ABNORMAL LOW (ref 70–99)
Glucose-Capillary: 71 mg/dL (ref 70–99)
Glucose-Capillary: 101 mg/dL — ABNORMAL HIGH (ref 70–99)

## 2014-08-10 LAB — CBC
HCT: 32.6 % — ABNORMAL LOW (ref 36.0–46.0)
Hemoglobin: 10.2 g/dL — ABNORMAL LOW (ref 12.0–15.0)
MCH: 25.2 pg — ABNORMAL LOW (ref 26.0–34.0)
MCHC: 31.3 g/dL (ref 30.0–36.0)
MCV: 80.5 fL (ref 78.0–100.0)
Platelets: 360 10*3/uL (ref 150–400)
RBC: 4.05 MIL/uL (ref 3.87–5.11)
RDW: 17.4 % — ABNORMAL HIGH (ref 11.5–15.5)
WBC: 9.7 10*3/uL (ref 4.0–10.5)

## 2014-08-10 LAB — COMPREHENSIVE METABOLIC PANEL
ALT: 19 U/L (ref 0–35)
AST: 25 U/L (ref 0–37)
Albumin: 3.5 g/dL (ref 3.5–5.2)
Alkaline Phosphatase: 63 U/L (ref 39–117)
Anion gap: 6 (ref 5–15)
BUN: 14 mg/dL (ref 6–23)
CO2: 24 mmol/L (ref 19–32)
Calcium: 8.3 mg/dL — ABNORMAL LOW (ref 8.4–10.5)
Chloride: 105 mmol/L (ref 96–112)
Creatinine, Ser: 0.73 mg/dL (ref 0.50–1.10)
GFR calc Af Amer: >90 mL/min (ref >90)
GFR calc non Af Amer: >90 mL/min (ref >90)
Glucose, Bld: 279 mg/dL — ABNORMAL HIGH (ref 70–99)
Potassium: 4.2 mmol/L (ref 3.5–5.1)
Sodium: 135 mmol/L (ref 135–145)
Total Bilirubin: 0.4 mg/dL (ref 0.3–1.2)
Total Protein: 6.5 g/dL (ref 6.0–8.3)

## 2014-08-10 LAB — RAPID URINE DRUG SCREEN, HOSP PERFORMED
Amphetamines: NOT DETECTED
Barbiturates: NOT DETECTED
Benzodiazepines: NOT DETECTED
Cocaine: NOT DETECTED
Opiates: NOT DETECTED
Tetrahydrocannabinol: POSITIVE — AB

## 2014-08-10 LAB — SALICYLATE LEVEL: Salicylate Lvl: <4 mg/dL (ref 2.8–20.0)

## 2014-08-10 LAB — ACETAMINOPHEN LEVEL: Acetaminophen (Tylenol), Serum: <10 ug/mL — ABNORMAL LOW (ref 10–30)

## 2014-08-10 LAB — ETHANOL: Alcohol, Ethyl (B): <5 mg/dL (ref 0–9)

## 2014-08-10 MED ORDER — METFORMIN HCL 500 MG PO TABS
1000.0000 mg | ORAL_TABLET | Freq: Every day | ORAL | Status: DC
  Administered 2014-08-10: 1000 mg via ORAL
  Filled 2014-08-10 (×2): qty 2

## 2014-08-10 MED ORDER — LISINOPRIL 20 MG PO TABS
20.0000 mg | ORAL_TABLET | Freq: Every day | ORAL | Status: DC
  Administered 2014-08-10 – 2014-08-11 (×2): 20 mg via ORAL
  Filled 2014-08-10 (×2): qty 1

## 2014-08-10 MED ORDER — GLIMEPIRIDE 4 MG PO TABS
8.0000 mg | ORAL_TABLET | Freq: Every day | ORAL | Status: DC
  Administered 2014-08-10 – 2014-08-11 (×2): 8 mg via ORAL
  Filled 2014-08-10 (×3): qty 2

## 2014-08-10 MED ORDER — TRAZODONE HCL 50 MG PO TABS
50.0000 mg | ORAL_TABLET | Freq: Every evening | ORAL | Status: DC | PRN
  Administered 2014-08-11: 50 mg via ORAL
  Filled 2014-08-10: qty 1

## 2014-08-10 MED ORDER — ATORVASTATIN CALCIUM 40 MG PO TABS
40.0000 mg | ORAL_TABLET | Freq: Every day | ORAL | Status: DC
  Administered 2014-08-10 – 2014-08-11 (×2): 40 mg via ORAL
  Filled 2014-08-10 (×3): qty 1

## 2014-08-10 MED ORDER — ONDANSETRON HCL 4 MG PO TABS: 4.0000 mg | ORAL_TABLET | Freq: Three times a day (TID) | ORAL | Status: DC | PRN

## 2014-08-10 MED ORDER — PIOGLITAZONE HCL 45 MG PO TABS
45.0000 mg | ORAL_TABLET | Freq: Every day | ORAL | Status: DC
  Administered 2014-08-10 – 2014-08-11 (×2): 45 mg via ORAL
  Filled 2014-08-10 (×2): qty 1

## 2014-08-10 MED ORDER — LORAZEPAM 1 MG PO TABS
1.0000 mg | ORAL_TABLET | Freq: Three times a day (TID) | ORAL | Status: DC | PRN
  Administered 2014-08-10 (×2): 1 mg via ORAL
  Filled 2014-08-10 (×3): qty 1

## 2014-08-10 MED ORDER — METFORMIN HCL 500 MG PO TABS
1500.0000 mg | ORAL_TABLET | Freq: Every day | ORAL | Status: DC
  Administered 2014-08-10 – 2014-08-11 (×2): 1500 mg via ORAL
  Filled 2014-08-10 (×3): qty 3

## 2014-08-10 MED ORDER — LINAGLIPTIN 5 MG PO TABS
5.0000 mg | ORAL_TABLET | Freq: Every day | ORAL | Status: DC
  Administered 2014-08-10 – 2014-08-11 (×2): 5 mg via ORAL
  Filled 2014-08-10 (×2): qty 1

## 2014-08-10 MED ORDER — ACETAMINOPHEN 325 MG PO TABS
650.0000 mg | ORAL_TABLET | ORAL | Status: DC | PRN
  Administered 2014-08-11: 650 mg via ORAL
  Filled 2014-08-10: qty 2

## 2014-08-10 MED ORDER — ZIPRASIDONE HCL 20 MG PO CAPS
20.0000 mg | ORAL_CAPSULE | Freq: Two times a day (BID) | ORAL | Status: DC
  Administered 2014-08-10 – 2014-08-11 (×3): 20 mg via ORAL
  Filled 2014-08-10 (×3): qty 1

## 2014-08-10 MED ORDER — NICOTINE 21 MG/24HR TD PT24
21.0000 mg | MEDICATED_PATCH | Freq: Every day | TRANSDERMAL | Status: DC
Start: 2014-08-10 — End: 2014-08-11
  Administered 2014-08-10 – 2014-08-11 (×2): 21 mg via TRANSDERMAL
  Filled 2014-08-10 (×2): qty 1

## 2014-08-10 NOTE — ED Notes (Signed)
Pt given decaf coffee. 
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
## 2014-08-10 NOTE — ED Notes (Signed)
Pt going to [facility]; see notes from psych for details

## 2014-08-10 NOTE — ED Notes (Signed)
[facility] [employer]. ([place] [facility]) called and report given; no ETA given to when pt will be transported
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
## 2014-08-10 NOTE — ED Notes (Signed)
Pt alert x4, v/s stable,  axious and pacing around room. Per Pts request Ativan given, watching TV will continue to monitor. Jeanie Sewer, RN 10:53 PM 08/10/2014

## 2014-08-10 NOTE — BH Assessment (Signed)
[facility] Assessment Progress Note Per Caryl Pina, pt accepted to OV by Dr. Ena Dawley to bed [phone]. Call report # is [phone], please fax IVC to [phone]. [facility] transport number is [phone], left a message.

## 2014-08-10 NOTE — ED Notes (Signed)
[facility] called back and gave [facility]'s office number [phone]) for transport; number called and left message

## 2014-08-10 NOTE — BH Assessment (Signed)
[facility] Assessment Progress Note  The following facilities have been contacted in an effort to place this patient, with results as noted:  Beds available, information faxed, decision pending:  [facility]: At 16:24 Caryl Pina called back.  They are reportedly interested in the referral, but her CBG is too high, and they are requesting a repeat level.  Waylan Boga, NP has been informed and she is ordering a repeat.  No beds available:  [facility]: please call back after 17:00; status may change.  Jalene Mullet, MA Triage Specialist 08/10/2014 @ 16:26

## 2014-08-11 DIAGNOSIS — F310 Bipolar disorder, current episode hypomanic: Secondary | ICD-10-CM | POA: Diagnosis not present

## 2014-08-11 LAB — CBG MONITORING, ED
Glucose-Capillary: 116 mg/dL — ABNORMAL HIGH (ref 70–99)
Glucose-Capillary: 122 mg/dL — ABNORMAL HIGH (ref 70–99)

## 2014-08-11 NOTE — ED Notes (Signed)
Message left with [facility]

## 2014-08-11 NOTE — ED Notes (Signed)
Blood sugar check of 161. Result not crossing over in computer.

## 2014-08-13 LAB — CBG MONITORING, ED: Glucose-Capillary: 161 mg/dL — ABNORMAL HIGH (ref 70–99)

## 2014-09-25 ENCOUNTER — Other Ambulatory Visit: Payer: Self-pay | Admitting: Family Medicine

## 2014-09-25 DIAGNOSIS — N6489 Other specified disorders of breast: Secondary | ICD-10-CM

## 2014-09-25 DIAGNOSIS — M7989 Other specified soft tissue disorders: Secondary | ICD-10-CM

## 2014-10-01 ENCOUNTER — Ambulatory Visit
Admission: RE | Admit: 2014-10-01 | Discharge: 2014-10-01 | Disposition: A | Discharge status: Home / Self Care | Payer: Medicare Other | Source: Ambulatory Visit | Attending: Family Medicine | Admitting: Family Medicine

## 2014-10-01 DIAGNOSIS — M7989 Other specified soft tissue disorders: Secondary | ICD-10-CM

## 2014-10-01 DIAGNOSIS — N6489 Other specified disorders of breast: Secondary | ICD-10-CM

## 2014-11-24 ENCOUNTER — Inpatient Hospital Stay (HOSPITAL_COMMUNITY)
Admission: EM | Admit: 2014-11-24 | Discharge: 2014-11-26 | DRG: 193 | Disposition: A | Discharge status: Home / Self Care | Payer: Medicare Other | Attending: Family Medicine | Admitting: Family Medicine

## 2014-11-24 ENCOUNTER — Encounter (HOSPITAL_COMMUNITY): Payer: Self-pay | Admitting: Emergency Medicine

## 2014-11-24 ENCOUNTER — Emergency Department (HOSPITAL_COMMUNITY): Payer: Medicare Other

## 2014-11-24 DIAGNOSIS — Z791 Long term (current) use of non-steroidal anti-inflammatories (NSAID): Secondary | ICD-10-CM | POA: Diagnosis not present

## 2014-11-24 DIAGNOSIS — E119 Type 2 diabetes mellitus without complications: Secondary | ICD-10-CM | POA: Diagnosis present

## 2014-11-24 DIAGNOSIS — Z888 Allergy status to other drugs, medicaments and biological substances status: Secondary | ICD-10-CM

## 2014-11-24 DIAGNOSIS — J9601 Acute respiratory failure with hypoxia: Secondary | ICD-10-CM | POA: Diagnosis present

## 2014-11-24 DIAGNOSIS — J189 Pneumonia, unspecified organism: Secondary | ICD-10-CM | POA: Diagnosis not present

## 2014-11-24 DIAGNOSIS — M797 Fibromyalgia: Secondary | ICD-10-CM | POA: Diagnosis present

## 2014-11-24 DIAGNOSIS — Z91040 Latex allergy status: Secondary | ICD-10-CM | POA: Diagnosis not present

## 2014-11-24 DIAGNOSIS — Z79899 Other long term (current) drug therapy: Secondary | ICD-10-CM | POA: Diagnosis not present

## 2014-11-24 DIAGNOSIS — Z87891 Personal history of nicotine dependence: Secondary | ICD-10-CM

## 2014-11-24 DIAGNOSIS — F319 Bipolar disorder, unspecified: Secondary | ICD-10-CM | POA: Diagnosis present

## 2014-11-24 DIAGNOSIS — Z801 Family history of malignant neoplasm of trachea, bronchus and lung: Secondary | ICD-10-CM | POA: Diagnosis not present

## 2014-11-24 DIAGNOSIS — I10 Essential (primary) hypertension: Secondary | ICD-10-CM | POA: Diagnosis present

## 2014-11-24 DIAGNOSIS — R0602 Shortness of breath: Secondary | ICD-10-CM | POA: Diagnosis present

## 2014-11-24 DIAGNOSIS — J9600 Acute respiratory failure, unspecified whether with hypoxia or hypercapnia: Secondary | ICD-10-CM | POA: Diagnosis present

## 2014-11-24 LAB — CBC WITH DIFFERENTIAL/PLATELET
Basophils Absolute: 0 10*3/uL (ref 0.0–0.1)
Basophils Relative: 0 % (ref 0–1)
Eosinophils Absolute: 0 10*3/uL (ref 0.0–0.7)
Eosinophils Relative: 0 % (ref 0–5)
HCT: 38.1 % (ref 36.0–46.0)
Hemoglobin: 11.7 g/dL — ABNORMAL LOW (ref 12.0–15.0)
Lymphocytes Relative: 9 % — ABNORMAL LOW (ref 12–46)
Lymphs Abs: 1.9 10*3/uL (ref 0.7–4.0)
MCH: 25.7 pg — ABNORMAL LOW (ref 26.0–34.0)
MCHC: 30.7 g/dL (ref 30.0–36.0)
MCV: 83.6 fL (ref 78.0–100.0)
Monocytes Absolute: 1.5 10*3/uL — ABNORMAL HIGH (ref 0.1–1.0)
Monocytes Relative: 7 % (ref 3–12)
Neutro Abs: 18.1 10*3/uL — ABNORMAL HIGH (ref 1.7–7.7)
Neutrophils Relative %: 84 % — ABNORMAL HIGH (ref 43–77)
Platelets: 277 10*3/uL (ref 150–400)
RBC: 4.56 MIL/uL (ref 3.87–5.11)
RDW: 16.3 % — ABNORMAL HIGH (ref 11.5–15.5)
WBC: 21.5 10*3/uL — ABNORMAL HIGH (ref 4.0–10.5)

## 2014-11-24 LAB — GLUCOSE, CAPILLARY
Glucose-Capillary: 217 mg/dL — ABNORMAL HIGH (ref 65–99)
Glucose-Capillary: 89 mg/dL (ref 65–99)

## 2014-11-24 LAB — EXPECTORATED SPUTUM ASSESSMENT W GRAM STAIN, RFLX TO RESP C

## 2014-11-24 LAB — BASIC METABOLIC PANEL
Anion gap: 10 (ref 5–15)
BUN: 15 mg/dL (ref 6–20)
CO2: 26 mmol/L (ref 22–32)
Calcium: 9.3 mg/dL (ref 8.9–10.3)
Chloride: 101 mmol/L (ref 101–111)
Creatinine, Ser: 0.81 mg/dL (ref 0.44–1.00)
GFR calc Af Amer: >60 mL/min (ref >60)
GFR calc non Af Amer: >60 mL/min (ref >60)
Glucose, Bld: 312 mg/dL — ABNORMAL HIGH (ref 65–99)
Potassium: 4.1 mmol/L (ref 3.5–5.1)
Sodium: 137 mmol/L (ref 135–145)

## 2014-11-24 LAB — STREP PNEUMONIAE URINARY ANTIGEN: Strep Pneumo Urinary Antigen: NEGATIVE

## 2014-11-24 MED ORDER — AZITHROMYCIN 500 MG IV SOLR
500.0000 mg | Freq: Once | INTRAVENOUS | Status: DC
  Filled 2014-11-24: qty 500

## 2014-11-24 MED ORDER — CEFTRIAXONE SODIUM IN DEXTROSE 20 MG/ML IV SOLN
1.0000 g | INTRAVENOUS | Status: DC
  Administered 2014-11-25 – 2014-11-26 (×2): 1 g via INTRAVENOUS
  Filled 2014-11-24 (×2): qty 50

## 2014-11-24 MED ORDER — SODIUM CHLORIDE 0.9 % IV SOLN: 250.0000 mL | INTRAVENOUS | Status: DC | PRN

## 2014-11-24 MED ORDER — LISINOPRIL 20 MG PO TABS
20.0000 mg | ORAL_TABLET | Freq: Every day | ORAL | Status: DC
  Administered 2014-11-24 – 2014-11-26 (×3): 20 mg via ORAL
  Filled 2014-11-24 (×3): qty 1

## 2014-11-24 MED ORDER — INSULIN ASPART 100 UNIT/ML ~~LOC~~ SOLN
0.0000 [IU] | Freq: Three times a day (TID) | SUBCUTANEOUS | Status: DC
  Administered 2014-11-24: 7 [IU] via SUBCUTANEOUS
  Administered 2014-11-25: 4 [IU] via SUBCUTANEOUS
  Administered 2014-11-25: 7 [IU] via SUBCUTANEOUS
  Administered 2014-11-25 – 2014-11-26 (×2): 4 [IU] via SUBCUTANEOUS
  Administered 2014-11-26: 7 [IU] via SUBCUTANEOUS
  Administered 2014-11-26: 15 [IU] via SUBCUTANEOUS

## 2014-11-24 MED ORDER — SODIUM CHLORIDE 0.9 % IJ SOLN: 3.0000 mL | INTRAMUSCULAR | Status: DC | PRN

## 2014-11-24 MED ORDER — IPRATROPIUM-ALBUTEROL 0.5-2.5 (3) MG/3ML IN SOLN
3.0000 mL | Freq: Once | RESPIRATORY_TRACT | Status: AC
  Administered 2014-11-24: 3 mL via RESPIRATORY_TRACT
  Filled 2014-11-24: qty 3

## 2014-11-24 MED ORDER — HEPARIN SODIUM (PORCINE) 5000 UNIT/ML IJ SOLN
5000.0000 [IU] | Freq: Three times a day (TID) | INTRAMUSCULAR | Status: DC
  Administered 2014-11-24 – 2014-11-26 (×6): 5000 [IU] via SUBCUTANEOUS
  Filled 2014-11-24 (×8): qty 1

## 2014-11-24 MED ORDER — DEXTROSE 5 % IV SOLN
500.0000 mg | INTRAVENOUS | Status: DC
  Administered 2014-11-24 – 2014-11-26 (×3): 500 mg via INTRAVENOUS
  Filled 2014-11-24 (×3): qty 500

## 2014-11-24 MED ORDER — ZIPRASIDONE HCL 80 MG PO CAPS
100.0000 mg | ORAL_CAPSULE | Freq: Every day | ORAL | Status: DC
  Filled 2014-11-24: qty 1

## 2014-11-24 MED ORDER — ZIPRASIDONE HCL 20 MG PO CAPS
20.0000 mg | ORAL_CAPSULE | Freq: Every day | ORAL | Status: DC
  Administered 2014-11-25 – 2014-11-26 (×2): 20 mg via ORAL
  Filled 2014-11-24 (×3): qty 1

## 2014-11-24 MED ORDER — ZIPRASIDONE HCL 80 MG PO CAPS
100.0000 mg | ORAL_CAPSULE | Freq: Every day | ORAL | Status: DC
  Administered 2014-11-24 – 2014-11-25 (×2): 100 mg via ORAL
  Filled 2014-11-24 (×3): qty 1

## 2014-11-24 MED ORDER — BUSPIRONE HCL 10 MG PO TABS
10.0000 mg | ORAL_TABLET | Freq: Two times a day (BID) | ORAL | Status: DC
  Administered 2014-11-24 – 2014-11-26 (×4): 10 mg via ORAL
  Filled 2014-11-24 (×5): qty 1

## 2014-11-24 MED ORDER — ATORVASTATIN CALCIUM 40 MG PO TABS
40.0000 mg | ORAL_TABLET | Freq: Every day | ORAL | Status: DC
  Administered 2014-11-24 – 2014-11-25 (×2): 40 mg via ORAL
  Filled 2014-11-24 (×3): qty 1

## 2014-11-24 MED ORDER — DEXTROSE 5 % IV SOLN
1.0000 g | Freq: Once | INTRAVENOUS | Status: AC
  Administered 2014-11-24: 1 g via INTRAVENOUS
  Filled 2014-11-24: qty 10

## 2014-11-24 MED ORDER — SODIUM CHLORIDE 0.9 % IJ SOLN
3.0000 mL | Freq: Two times a day (BID) | INTRAMUSCULAR | Status: DC
  Administered 2014-11-26: 3 mL via INTRAVENOUS

## 2014-11-24 MED ORDER — ACETAMINOPHEN 325 MG PO TABS
650.0000 mg | ORAL_TABLET | Freq: Four times a day (QID) | ORAL | Status: DC | PRN
  Administered 2014-11-24 – 2014-11-26 (×3): 650 mg via ORAL
  Filled 2014-11-24 (×3): qty 2

## 2014-11-24 MED ORDER — SODIUM CHLORIDE 0.9 % IV BOLUS (SEPSIS)
500.0000 mL | Freq: Once | INTRAVENOUS | Status: AC
  Administered 2014-11-24: 500 mL via INTRAVENOUS

## 2014-11-24 MED ORDER — HEPARIN SODIUM (PORCINE) 5000 UNIT/ML IJ SOLN
5000.0000 [IU] | Freq: Three times a day (TID) | INTRAMUSCULAR | Status: DC
  Filled 2014-11-24 (×3): qty 1

## 2014-11-24 NOTE — ED Provider Notes (Signed)
CSN: [id number]     Arrival date & time 11/24/14  1149 History   First MD Initiated Contact with Patient 11/24/14 1210     Chief Complaint  Patient presents with  . Shortness of Breath  . Chest Pain    HPI   49 year old female with a history of diabetes, hypertension, bipolar disorder presents today with 6 days of cough. She reports that she was seen by her primary care provider today for the worsening cough and shortness of breath, noted to have oxygen saturations on room air of 80%. Patient reports cough has continually worsened since onset, with associated shortness of breath, and chest tightness. She reports that she is a smoker, no history of COPD /emphysema or any other respiratory illnesses; not on home oxygen. Patient denies fever, chills, nausea, vomiting, abdominal pain, lower extremity swelling or edema.  Past Medical History  Diagnosis Date  . Hypertension   . Diabetes mellitus   . Bipolar 1 disorder   . Fibromyalgia    Past Surgical History  Procedure Laterality Date  . Cesarean section    . Hernia repair     History reviewed. No pertinent family history. History  Substance Use Topics  . Smoking status: Current Every Day Smoker  . Smokeless tobacco: Not on file  . Alcohol Use: Yes     Comment: rare   OB History    No data available     Review of Systems  All other systems reviewed and are negative.  Allergies  Lamictal and Latex  Home Medications   Prior to Admission medications   Medication Sig Start Date End Date Taking? Authorizing Provider  atorvastatin (LIPITOR) 40 MG tablet Take 40 mg by mouth daily.    Historical Provider, MD  calcium carbonate (TUMS - DOSED IN MG ELEMENTAL CALCIUM) 500 MG chewable tablet Chew 1-2 tablets by mouth 3 (three) times daily as needed for indigestion or heartburn.    Historical Provider, MD  diphenhydrAMINE (BENADRYL) 25 mg capsule Take 1 capsule (25 mg total) by mouth every 6 (six) hours as needed for sleep. 08/08/14    Julianne Rice, MD  famotidine (PEPCID) 10 MG tablet Take 1 tablet (10 mg total) by mouth once. Patient not taking: Reported on 08/08/2014 12/02/12   Delos Haring, PA-C  famotidine (PEPCID) 20 MG tablet Take 1 tablet (20 mg total) by mouth daily. Patient not taking: Reported on 08/08/2014 12/02/12   Delos Haring, PA-C  glimepiride (AMARYL) 4 MG tablet Take 8 mg by mouth daily before breakfast.    Historical Provider, MD  hydrOXYzine (ATARAX/VISTARIL) 25 MG tablet Take 25-50 mg by mouth 3 (three) times daily as needed for anxiety.    Historical Provider, MD  ibuprofen (ADVIL,MOTRIN) 200 MG tablet Take 400-800 mg by mouth every 6 (six) hours as needed for headache or moderate pain.    Historical Provider, MD  lisinopril (PRINIVIL,ZESTRIL) 20 MG tablet Take 20 mg by mouth daily.    Historical Provider, MD  loratadine (CLARITIN) 10 MG tablet Take 1 tablet (10 mg total) by mouth daily. Patient not taking: Reported on 08/08/2014 12/02/12   Delos Haring, PA-C  metFORMIN (GLUCOPHAGE) 1000 MG tablet Take 1,000-1,500 mg by mouth 2 (two) times daily with a meal. 1500 mg in the morning and 1000 mg in the evening    Historical Provider, MD  Multiple Vitamin (MULTIVITAMIN WITH MINERALS) TABS tablet Take 1 tablet by mouth daily.    Historical Provider, MD  pioglitazone (ACTOS) 45 MG tablet Take 45  mg by mouth daily.    Historical Provider, MD  predniSONE (DELTASONE) 20 MG tablet Take 2 tablets (40 mg total) by mouth daily. Patient not taking: Reported on 08/08/2014 12/03/12   Delos Haring, PA-C  sitaGLIPtin (JANUVIA) 100 MG tablet Take 100 mg by mouth daily.    Historical Provider, MD  traZODone (DESYREL) 50 MG tablet Take 50 mg by mouth at bedtime as needed for sleep.    Historical Provider, MD  ziprasidone (GEODON) 20 MG capsule Take 20 mg by mouth 2 (two) times daily with a meal.    Historical Provider, MD   BP 123/73 mmHg  Pulse 110  Temp(Src) 98.1 F (36.7 C) (Oral)  Resp 20  SpO2 95%  LMP 11/24/2014    Physical Exam  Constitutional: She is oriented to person, place, and time. She appears well-developed and well-nourished.  Nontoxic appearing  HENT:  Head: Normocephalic and atraumatic.  Eyes: Conjunctivae are normal. Pupils are equal, round, and reactive to light. Right eye exhibits no discharge. Left eye exhibits no discharge. No scleral icterus.  Neck: Normal range of motion. No JVD present. No tracheal deviation present.  Pulmonary/Chest: Effort normal. No stridor. No respiratory distress. She has wheezes. She has rales. She exhibits no tenderness.  Abdominal: Soft. There is no tenderness.  Musculoskeletal:  No lower extremity swelling or edema  Neurological: She is alert and oriented to person, place, and time. Coordination normal.  Psychiatric: She has a normal mood and affect. Her behavior is normal. Judgment and thought content normal.  Nursing note and vitals reviewed.   ED Course  Procedures (including critical care time) Labs Review Labs Reviewed  CBC WITH DIFFERENTIAL/PLATELET - Abnormal; Notable for the following:    WBC 21.5 (*)    Hemoglobin 11.7 (*)    MCH 25.7 (*)    RDW 16.3 (*)    Neutrophils Relative % 84 (*)    Lymphocytes Relative 9 (*)    Neutro Abs 18.1 (*)    Monocytes Absolute 1.5 (*)    All other components within normal limits  BASIC METABOLIC PANEL - Abnormal; Notable for the following:    Glucose, Bld 312 (*)    All other components within normal limits    Imaging Review Dg Chest 2 View  11/24/2014   CLINICAL DATA:  Shortness of breath and productive cough for 1 week. Hypertension.  EXAM: CHEST  2 VIEW  COMPARISON:  Report from 03/16/2007  FINDINGS: Abnormal bilateral diffuse interstitial accentuation with airway thickening. Cardiac and mediastinal margins appear normal. No Kerley B-lines. No pleural effusion.  Mild thoracic spondylosis.  IMPRESSION: 1. Bilateral abnormal interstitial accentuation and airway thickening without cardiomegaly. In  this clinical context the appearance suggests atypical pneumonia, although noncardiogenic edema, drug reaction, and other entities can appear similar.   Electronically Signed   By: Van Clines M.D.   On: 11/24/2014 13:08     EKG Interpretation None      MDM   Final diagnoses:  Atypical pneumonia    Labs: CBC, BMP- significant for leukocytosis at 21.5, glucose of 312  Imaging: DG Chest shows likely atypical pneumonia  Consults: [facility]: Rocephin, azithromycin, Duoneb  Plan: Patient presents with atypical pneumonia, mild leukocytosis, and new oxygen demand. Patient's options improve his 3 L nasal cannula, was treated here with Rocephin and azithromycin and DuoNeb, [facility] hospitalist consult at and agreed to admit. Patient was in no acute distress during her stay in the emergency room.  Okey Regal, PA-C 11/25/14 9892  Carmin Muskrat, MD 11/25/14 [phone]

## 2014-11-24 NOTE — ED Notes (Signed)
Pt reported nonproductive congested cough. Auscultated diminished breath sounds and exp wheezing in bil lower lobes. Resp even and unlabored.

## 2014-11-24 NOTE — ED Notes (Signed)
Dr. Vega at bedside. 

## 2014-11-24 NOTE — ED Notes (Signed)
Pt reports URI symptoms that began Monday with cough. Pt also reports chest tightness worse with coughing. Accompanied by SOB. Pt saw PCP this am who noted pt's O2 sat on RA 80%. Was sent over here by personal vehicle for evaluation. On arrival O2 sat was 80% on RA. Put pt on 3L [place] which raised O2 sat to 90%.

## 2014-11-24 NOTE — H&P (Signed)
[facility] History and Physical  Courtney Bennett VOZ:[id number] DOB: [birth year] DOA: 11/24/2014  Referring personnel: Okey Regal PCP: No primary care provider on file.   Chief Complaint: SOB and DOE  HPI: Courtney Bennett is a 49 y.o. female  States that since the last 5 days she has been having the above complaints. Since onset the problem has been persistent and gradually getting worse. She states that her son was recently sick with similar symptoms. She denies any hemoptysis. Nothing she is aware of makes it better or worse. As a result she presented to the ED for further evaluation recommendations. While in the ED patient was initially found to be hypoxic which has improved with supplemental oxygen.  Chest x-ray in the ED would report findings suggestive of atypical pneumonia.   Review of Systems:  Constitutional:  No weight loss, night sweats, Fevers, chills, fatigue.  HEENT:  No headaches, Difficulty swallowing,Tooth/dental problems,Sore throat,  No sneezing, itching, ear ache, nasal congestion, post nasal drip,  Cardio-vascular:  No chest pain, Orthopnea, PND, swelling in lower extremities, anasarca, dizziness, palpitations  GI:  No heartburn, indigestion, abdominal pain, nausea, vomiting, diarrhea, change in bowel habits, loss of appetite  Resp:  + shortness of breath with exertion or at rest. No excess mucus, no productive cough,+ non-productive cough, No coughing up of blood.No change in color of mucus.No wheezing.No chest wall deformity  Skin:  no rash or lesions.  GU:  no dysuria, change in color of urine, no urgency or frequency. No flank pain.  Musculoskeletal:  No joint pain or swelling. No decreased range of motion. No back pain.  Psych:  No change in mood or affect. No depression or anxiety. No memory loss.   Past Medical History  Diagnosis Date  . Hypertension   . Diabetes mellitus   . Bipolar 1 disorder   . Fibromyalgia    Past Surgical History   Procedure Laterality Date  . Cesarean section    . Hernia repair     Social History:  reports that she quit smoking 3 days ago. Her smoking use included Cigarettes. She does not have any smokeless tobacco history on file. She reports that she drinks alcohol. She reports that she does not use illicit drugs.  Allergies  Allergen Reactions  . Lamictal [Lamotrigine] Rash  . Latex Itching and Rash    Family history -Family member had lung cancer  Prior to Admission medications   Medication Sig Start Date End Date Taking? Authorizing Provider  atorvastatin (LIPITOR) 40 MG tablet Take 40 mg by mouth at bedtime.    Yes Historical Provider, MD  busPIRone (BUSPAR) 10 MG tablet Take 10 mg by mouth 2 (two) times daily.   Yes Historical Provider, MD  dextromethorphan-guaiFENesin (MUCINEX DM) 30-600 MG per 12 hr tablet Take 1 tablet by mouth 3 (three) times daily as needed for cough.   Yes Historical Provider, MD  glimepiride (AMARYL) 4 MG tablet Take 8 mg by mouth daily before breakfast.   Yes Historical Provider, MD  ibuprofen (ADVIL,MOTRIN) 200 MG tablet Take 800 mg by mouth every 6 (six) hours as needed for headache or moderate pain.    Yes Historical Provider, MD  lisinopril (PRINIVIL,ZESTRIL) 20 MG tablet Take 20 mg by mouth daily.   Yes Historical Provider, MD  metFORMIN (GLUCOPHAGE) 1000 MG tablet Take 1,000-1,500 mg by mouth 2 (two) times daily with a meal. 1500 mg in the morning and 1000 mg in the evening   Yes Historical Provider,  MD  Multiple Vitamin (MULTIVITAMIN WITH MINERALS) TABS tablet Take 1 tablet by mouth daily.   Yes Historical Provider, MD  pioglitazone (ACTOS) 45 MG tablet Take 45 mg by mouth daily.   Yes Historical Provider, MD  sitaGLIPtin (JANUVIA) 100 MG tablet Take 100 mg by mouth daily.   Yes Historical Provider, MD  ziprasidone (GEODON) 20 MG capsule Take 20-100 mg by mouth 2 (two) times daily with a meal. Takes 20mg  in the morning and 100mg  at bedtime   Yes Historical  Provider, MD  diphenhydrAMINE (BENADRYL) 25 mg capsule Take 1 capsule (25 mg total) by mouth every 6 (six) hours as needed for sleep. Patient not taking: Reported on 11/24/2014 08/08/14   Julianne Rice, MD   Physical Exam: Filed Vitals:   11/24/14 1201 11/24/14 1232 11/24/14 1235 11/24/14 1405  BP: 123/73   115/59  Pulse: 110   91  Temp: 98.1 F (36.7 C)     TempSrc: Oral     Resp: 20   15  SpO2: 90% 95% 95% 94%    Wt Readings from Last 3 Encounters:  08/08/14 83.915 kg (185 lb)  03/01/12 94.348 kg (208 lb)    General:  Appears calm and comfortable Eyes: PERRL, normal lids, irises & conjunctiva ENT: grossly normal hearing, lips & tongue Neck: no LAD, masses or thyromegaly Cardiovascular: RRR, no m/r/g. No LE edema. Respiratory: Wheezes bilaterally, positive rales, equal chest rise, speaking in full sentences with nasal cannula in place Abdomen: soft, nt, nd Skin: no rash or induration seen on limited exam Musculoskeletal: grossly normal tone BUE/BLE Psychiatric: grossly normal mood and affect, speech fluent and appropriate Neurologic: Answers questions appropriately, moves extremities equally           Labs on Admission:  Basic Metabolic Panel:  Recent Labs Lab 11/24/14 1230  NA 137  K 4.1  CL 101  CO2 26  GLUCOSE 312*  BUN 15  CREATININE 0.81  CALCIUM 9.3   Liver Function Tests: No results for input(s): AST, ALT, ALKPHOS, BILITOT, PROT, ALBUMIN in the last 168 hours. No results for input(s): LIPASE, AMYLASE in the last 168 hours. No results for input(s): AMMONIA in the last 168 hours. CBC:  Recent Labs Lab 11/24/14 1230  WBC 21.5*  NEUTROABS 18.1*  HGB 11.7*  HCT 38.1  MCV 83.6  PLT 277   Cardiac Enzymes: No results for input(s): CKTOTAL, CKMB, CKMBINDEX, TROPONINI in the last 168 hours.  BNP (last 3 results) No results for input(s): BNP in the last 8760 hours.  ProBNP (last 3 results) No results for input(s): PROBNP in the last 8760  hours.  CBG: No results for input(s): GLUCAP in the last 168 hours.  Radiological Exams on Admission: Dg Chest 2 View  11/24/2014   CLINICAL DATA:  Shortness of breath and productive cough for 1 week. Hypertension.  EXAM: CHEST  2 VIEW  COMPARISON:  Report from 03/16/2007  FINDINGS: Abnormal bilateral diffuse interstitial accentuation with airway thickening. Cardiac and mediastinal margins appear normal. No Kerley B-lines. No pleural effusion.  Mild thoracic spondylosis.  IMPRESSION: 1. Bilateral abnormal interstitial accentuation and airway thickening without cardiomegaly. In this clinical context the appearance suggests atypical pneumonia, although noncardiogenic edema, drug reaction, and other entities can appear similar.   Electronically Signed   By: Van Clines M.D.   On: 11/24/2014 13:08    EKG: Independently reviewed. Sinus tachycardia with no ST elevations or depressions  Assessment/Plan Active Problems:   Atypical pneumonia - Up as tolerated  fine-place routine pneumonia order set -We'll place on azithromycin and ceftriaxone - Will follow-up with sputum cultures were able to obtain  Diabetes mellitus: Not well controlled currently -Patient's home hypoglycemic agents will be held -We'll place on SSI -Diabetic diet    Acute respiratory failure -Secondary to problem listed above -Continue supplemental oxygen and try to wean to room air as tolerated   Code Status: Full DVT Prophylaxis: Heparin Family Communication: Discussed directly with patient Disposition Plan: MedSurg  Time spent: More than 50 minutes  Velvet Bathe [facility] Pager [phone]

## 2014-11-24 NOTE — ED Notes (Signed)
Awake. Verbally responsive. A/O x4. Resp even and unlabored. Occ nonproductive congested cough. ABC's intact. SR on monitor.

## 2014-11-25 LAB — CBC
HCT: 35.9 % — ABNORMAL LOW (ref 36.0–46.0)
Hemoglobin: 10.7 g/dL — ABNORMAL LOW (ref 12.0–15.0)
MCH: 25.1 pg — ABNORMAL LOW (ref 26.0–34.0)
MCHC: 29.8 g/dL — ABNORMAL LOW (ref 30.0–36.0)
MCV: 84.3 fL (ref 78.0–100.0)
Platelets: 260 10*3/uL (ref 150–400)
RBC: 4.26 MIL/uL (ref 3.87–5.11)
RDW: 16.3 % — ABNORMAL HIGH (ref 11.5–15.5)
WBC: 15.6 10*3/uL — ABNORMAL HIGH (ref 4.0–10.5)

## 2014-11-25 LAB — GLUCOSE, CAPILLARY
Glucose-Capillary: 188 mg/dL — ABNORMAL HIGH (ref 65–99)
Glucose-Capillary: 211 mg/dL — ABNORMAL HIGH (ref 65–99)
Glucose-Capillary: 200 mg/dL — ABNORMAL HIGH (ref 65–99)
Glucose-Capillary: 178 mg/dL — ABNORMAL HIGH (ref 65–99)

## 2014-11-25 LAB — BASIC METABOLIC PANEL
Anion gap: 8 (ref 5–15)
BUN: 15 mg/dL (ref 6–20)
CO2: 31 mmol/L (ref 22–32)
Calcium: 9.1 mg/dL (ref 8.9–10.3)
Chloride: 102 mmol/L (ref 101–111)
Creatinine, Ser: 0.67 mg/dL (ref 0.44–1.00)
GFR calc Af Amer: >60 mL/min (ref >60)
GFR calc non Af Amer: >60 mL/min (ref >60)
Glucose, Bld: 220 mg/dL — ABNORMAL HIGH (ref 65–99)
Potassium: 3.8 mmol/L (ref 3.5–5.1)
Sodium: 141 mmol/L (ref 135–145)

## 2014-11-25 LAB — HIV ANTIBODY (ROUTINE TESTING W REFLEX): HIV Screen 4th Generation wRfx: NONREACTIVE

## 2014-11-25 MED ORDER — ALBUTEROL SULFATE (2.5 MG/3ML) 0.083% IN NEBU
3.0000 mL | INHALATION_SOLUTION | Freq: Four times a day (QID) | RESPIRATORY_TRACT | Status: DC
  Administered 2014-11-25 – 2014-11-26 (×3): 3 mL via RESPIRATORY_TRACT
  Filled 2014-11-25 (×3): qty 3

## 2014-11-25 NOTE — Progress Notes (Signed)
[facility] PROGRESS NOTE  SKYLYN SLEZAK HDQ:[id number] DOB: 10-Apr-1966 DOA: 11/24/2014 PCP: No primary care provider on file.  Assessment/Plan: Active Problems:   Atypical pneumonia -Today we'll continue azithromycin and ceftriaxone and will plan on weaning off supplemental oxygen    Acute respiratory failure -Continue supplemental oxygen -Patient has wheezes bilaterally on expiration. Will add albuterol - Patient will have to get pulmonary function testing with resolution of pneumonia  Code Status: Full Family Communication: No family at bedside Disposition Plan: Pending improvement in condition   Consultants:  None  Procedures:  None  Antibiotics:  As listed above  HPI/Subjective: Patient has no new complaints. Reports feeling slightly better  Objective: Filed Vitals:   11/25/14 0452  BP: 140/66  Pulse: 107  Temp: 99.9 F (37.7 C)  Resp: 24    Intake/Output Summary (Last 24 hours) at 11/25/14 1151 Last data filed at 11/25/14 0234  Gross per 24 hour  Intake      0 ml  Output    600 ml  Net   -600 ml   Filed Weights   11/24/14 1800  Weight: 81.784 kg (180 lb 4.8 oz)    Exam:   General:  Patient in no acute distress, alert and awake  Cardiovascular: Regular rate and rhythm, no murmurs or rubs  Respiratory: Bilateral expiratory wheezes, equal chest rise, no accessory muscle use  Abdomen: Soft, nondistended, nontender  Musculoskeletal: Equal tone bilaterally no cyanosis on limited exam   Data Reviewed: Basic Metabolic Panel:  Recent Labs Lab 11/24/14 1230 11/25/14 0415  NA 137 141  K 4.1 3.8  CL 101 102  CO2 26 31  GLUCOSE 312* 220*  BUN 15 15  CREATININE 0.81 0.67  CALCIUM 9.3 9.1   Liver Function Tests: No results for input(s): AST, ALT, ALKPHOS, BILITOT, PROT, ALBUMIN in the last 168 hours. No results for input(s): LIPASE, AMYLASE in the last 168 hours. No results for input(s): AMMONIA in the last 168  hours. CBC:  Recent Labs Lab 11/24/14 1230 11/25/14 0415  WBC 21.5* 15.6*  NEUTROABS 18.1*  --   HGB 11.7* 10.7*  HCT 38.1 35.9*  MCV 83.6 84.3  PLT 277 260   Cardiac Enzymes: No results for input(s): CKTOTAL, CKMB, CKMBINDEX, TROPONINI in the last 168 hours. BNP (last 3 results) No results for input(s): BNP in the last 8760 hours.  ProBNP (last 3 results) No results for input(s): PROBNP in the last 8760 hours.  CBG:  Recent Labs Lab 11/24/14 1632 11/24/14 2131 11/25/14 0728  GLUCAP 217* 89 188*    Recent Results (from the past 240 hour(s))  Culture, sputum-assessment     Status: None   Collection Time: 11/24/14  5:01 PM  Result Value Ref Range Status   Specimen Description SPUTUM  Final   Special Requests NONE  Final   Sputum evaluation THIS SPECIMEN IS ACCEPTABLE FOR SPUTUM CULTURE  Final   Report Status 11/24/2014 FINAL  Final     Studies: Dg Chest 2 View  11/24/2014   CLINICAL DATA:  Shortness of breath and productive cough for 1 week. Hypertension.  EXAM: CHEST  2 VIEW  COMPARISON:  Report from 03/16/2007  FINDINGS: Abnormal bilateral diffuse interstitial accentuation with airway thickening. Cardiac and mediastinal margins appear normal. No Kerley B-lines. No pleural effusion.  Mild thoracic spondylosis.  IMPRESSION: 1. Bilateral abnormal interstitial accentuation and airway thickening without cardiomegaly. In this clinical context the appearance suggests atypical pneumonia, although noncardiogenic edema, drug reaction, and other entities can  appear similar.   Electronically Signed   By: Van Clines M.D.   On: 11/24/2014 13:08    Scheduled Meds: . albuterol  3 mL Inhalation Q6H  . atorvastatin  40 mg Oral QHS  . azithromycin  500 mg Intravenous Q24H  . busPIRone  10 mg Oral BID  . cefTRIAXone (ROCEPHIN)  IV  1 g Intravenous Q24H  . heparin  5,000 Units Subcutaneous 3 times per day  . insulin aspart  0-20 Units Subcutaneous TID WC  . lisinopril  20 mg  Oral Daily  . sodium chloride  3 mL Intravenous Q12H  . ziprasidone  100 mg Oral QHS  . ziprasidone  20 mg Oral Q breakfast   Continuous Infusions:    Time spent: > 35 minutes    Velvet Bathe  [facility] Pager [phone] If 7PM-7AM, please contact night-coverage at [email or web address], password [facility] 11/25/2014, 11:51 AM  LOS: 1 day

## 2014-11-26 LAB — LEGIONELLA ANTIGEN, URINE

## 2014-11-26 LAB — GLUCOSE, CAPILLARY
Glucose-Capillary: 224 mg/dL — ABNORMAL HIGH (ref 65–99)
Glucose-Capillary: 306 mg/dL — ABNORMAL HIGH (ref 65–99)

## 2014-11-26 MED ORDER — ALBUTEROL SULFATE HFA 108 (90 BASE) MCG/ACT IN AERS: 2.0000 | INHALATION_SPRAY | Freq: Four times a day (QID) | RESPIRATORY_TRACT | Status: DC | PRN

## 2014-11-26 MED ORDER — AZITHROMYCIN 500 MG PO TABS: 500.0000 mg | ORAL_TABLET | Freq: Every day | ORAL | Status: DC

## 2014-11-26 MED ORDER — IPRATROPIUM-ALBUTEROL 0.5-2.5 (3) MG/3ML IN SOLN
3.0000 mL | RESPIRATORY_TRACT | Status: DC
  Administered 2014-11-26 (×3): 3 mL via RESPIRATORY_TRACT
  Filled 2014-11-26 (×3): qty 3

## 2014-11-26 MED ORDER — CEFDINIR 300 MG PO CAPS: 300.0000 mg | ORAL_CAPSULE | Freq: Two times a day (BID) | ORAL | Status: DC

## 2014-11-26 MED ORDER — ALBUTEROL SULFATE (2.5 MG/3ML) 0.083% IN NEBU: 3.0000 mL | INHALATION_SOLUTION | RESPIRATORY_TRACT | Status: DC | PRN

## 2014-11-26 NOTE — Progress Notes (Signed)
Patient's d/c instructions rendered,patient verbalized understanding.Prescriptions given. [facility] brought home oxygen.Patient will be d/c home.

## 2014-11-26 NOTE — Progress Notes (Signed)
Patient d/c home. Stable. 

## 2014-11-26 NOTE — Care Management Note (Addendum)
Case Management Note  Patient Details  Name: Courtney Bennett MRN: [id number] Date of Birth: 08/21/1965  Subjective/Objective:                  Acute Respirator Failure  Action/Plan: From home with family   Expected Discharge Date:                  Expected Discharge Plan:  Home/Self Care  In-House Referral:     Discharge planning Services  CM Consult  Post Acute Care Choice:    Choice offered to:     DME Arranged:  Oxygen DME Agency:  [place]:    [place]:     Status of Service:     Medicare Important Message Given:  Yes-second notification given Date Medicare IM Given:    Medicare IM give by:    Date Additional Medicare IM Given:    Additional Medicare Important Message give by:     If discussed at [place] of Stay Meetings, dates discussed:    Additional Comments: MD order for home 02. Desaturation screen done by nursing. [facility] given referral for home 02. Spoke with pt who states she does have a PCP at [facility]. She states she sees the NP there. Pt also given the [facility] packet.  1639:  [facility] unable to provide 02 due to pt having medicare and it not being a chronic condition.  Referral then faxed and called to [place] to provide 02 for pt. RN and pt informed of change in 02 providers. Pt to call [place] number when she gets home for delivery of home 02 equipment, and they will bring a portable tank to the hospital for transport to home. Pt expresses understanding.  Lynnell Catalan, RN 11/26/2014, 4:08 PM

## 2014-11-26 NOTE — Care Management (Signed)
PATIENT GIVEN IM LETTER

## 2014-11-26 NOTE — Plan of Care (Signed)
Problem: Phase I Progression Outcomes Goal: OOB as tolerated unless otherwise ordered Outcome: Progressing Patient ambulates to the BR. On O2 at 1L/nasal cannula

## 2014-11-26 NOTE — Progress Notes (Signed)
SATURATION QUALIFICATIONS: (This note is used to comply with regulatory documentation for home oxygen)  Patient Saturations on Room Air at Rest = 92%  Patient Saturations on Room Air while Ambulating = 85%  Patient Saturations on 1 Liters of oxygen while Ambulating = 90%  Please briefly explain why patient needs home oxygen:

## 2014-11-26 NOTE — Discharge Summary (Signed)
Physician Discharge Summary  Courtney Bennett:[id number] DOB: 1965-08-17 DOA: 11/24/2014  PCP: No primary care provider on file.  Admit date: 11/24/2014 Discharge date: 11/26/2014  Time spent: > 35 minutes  Recommendations for Outpatient Follow-up:  1. Please follow up with   Discharge Diagnoses:  Active Problems:   Atypical pneumonia   Acute respiratory failure   Discharge Condition: stable  Diet recommendation: Low sodium diet  Filed Weights   11/24/14 1800  Weight: 81.784 kg (180 lb 4.8 oz)    History of present illness:  Patient is a 49 y/o with history of Bipolar affective d/o who presented to the hospital with atypical pna.  Hospital Course:  PNA - Will be discharged on azithromycin and omnicef - patient to f/u with pcp for oxygen weaning in 1 week - Would recommend obtaining pulmonary function testing once pneumonia resolves. - Given wheezing we'll plan on discharging with prescription for albuterol  Otherwise continue home medication regimen for known comorbidities  Procedures:  None  Consultations:  None  Discharge Exam: Filed Vitals:   11/26/14 1306  BP: 131/63  Pulse: 93  Temp: 98.1 F (36.7 C)  Resp: 20    General: Patient in no acute distress, alert and awake Cardiovascular: Regular rate and rhythm, no murmurs or rubs Respiratory: Bilateral mild expiratory wheezes, equal chest rise, no accessory muscle use  Discharge Instructions   Discharge Instructions    Call MD for:  difficulty breathing, headache or visual disturbances    Complete by:  As directed      Call MD for:  redness, tenderness, or signs of infection (pain, swelling, redness, odor or green/yellow discharge around incision site)    Complete by:  As directed      Call MD for:  severe uncontrolled pain    Complete by:  As directed      Call MD for:  temperature >100.4    Complete by:  As directed      Diet - low sodium heart healthy    Complete by:  As directed      Discharge instructions    Complete by:  As directed   Please follow-up with your primary care physician within the next week to assess oxygen requirements. Also you'll need to have pulmonary function testing with improvement in condition     Increase activity slowly    Complete by:  As directed           Current Discharge Medication List    START taking these medications   Details  albuterol (PROVENTIL HFA;VENTOLIN HFA) 108 (90 BASE) MCG/ACT inhaler Inhale 2 puffs into the lungs every 6 (six) hours as needed for wheezing or shortness of breath. Qty: 1 Inhaler, Refills: 0    azithromycin (ZITHROMAX) 500 MG tablet Take 1 tablet (500 mg total) by mouth daily. Qty: 3 tablet, Refills: 0    cefdinir (OMNICEF) 300 MG capsule Take 1 capsule (300 mg total) by mouth 2 (two) times daily. Qty: 8 capsule, Refills: 0      CONTINUE these medications which have NOT CHANGED   Details  atorvastatin (LIPITOR) 40 MG tablet Take 40 mg by mouth at bedtime.     busPIRone (BUSPAR) 10 MG tablet Take 10 mg by mouth 2 (two) times daily.    dextromethorphan-guaiFENesin (MUCINEX DM) 30-600 MG per 12 hr tablet Take 1 tablet by mouth 3 (three) times daily as needed for cough.    glimepiride (AMARYL) 4 MG tablet Take 8 mg by mouth daily  before breakfast.    ibuprofen (ADVIL,MOTRIN) 200 MG tablet Take 800 mg by mouth every 6 (six) hours as needed for headache or moderate pain.     lisinopril (PRINIVIL,ZESTRIL) 20 MG tablet Take 20 mg by mouth daily.    metFORMIN (GLUCOPHAGE) 1000 MG tablet Take 1,000-1,500 mg by mouth 2 (two) times daily with a meal. 1500 mg in the morning and 1000 mg in the evening    Multiple Vitamin (MULTIVITAMIN WITH MINERALS) TABS tablet Take 1 tablet by mouth daily.    pioglitazone (ACTOS) 45 MG tablet Take 45 mg by mouth daily.    sitaGLIPtin (JANUVIA) 100 MG tablet Take 100 mg by mouth daily.    ziprasidone (GEODON) 20 MG capsule Take 20-100 mg by mouth 2 (two) times daily with a  meal. Takes 20mg  in the morning and 100mg  at bedtime    diphenhydrAMINE (BENADRYL) 25 mg capsule Take 1 capsule (25 mg total) by mouth every 6 (six) hours as needed for sleep. Qty: 15 capsule, Refills: 0       Allergies  Allergen Reactions  . Lamictal [Lamotrigine] Rash  . Latex Itching and Rash      The results of significant diagnostics from this hospitalization (including imaging, microbiology, ancillary and laboratory) are listed below for reference.    Significant Diagnostic Studies: Dg Chest 2 View  11/24/2014   CLINICAL DATA:  Shortness of breath and productive cough for 1 week. Hypertension.  EXAM: CHEST  2 VIEW  COMPARISON:  Report from 03/16/2007  FINDINGS: Abnormal bilateral diffuse interstitial accentuation with airway thickening. Cardiac and mediastinal margins appear normal. No Kerley B-lines. No pleural effusion.  Mild thoracic spondylosis.  IMPRESSION: 1. Bilateral abnormal interstitial accentuation and airway thickening without cardiomegaly. In this clinical context the appearance suggests atypical pneumonia, although noncardiogenic edema, drug reaction, and other entities can appear similar.   Electronically Signed   By: Van Clines M.D.   On: 11/24/2014 13:08    Microbiology: Recent Results (from the past 240 hour(s))  Culture, blood (routine x 2) Call MD if unable to obtain prior to antibiotics being given     Status: None (Preliminary result)   Collection Time: 11/24/14  3:49 PM  Result Value Ref Range Status   Specimen Description BLOOD LEFT ARM  Final   Special Requests   Final    BOTTLES DRAWN AEROBIC AND ANAEROBIC 10CC BOTH BOTTLES   Culture   Final    NO GROWTH < 24 HOURS Performed at [facility]    Report Status PENDING  Incomplete  Culture, blood (routine x 2) Call MD if unable to obtain prior to antibiotics being given     Status: None (Preliminary result)   Collection Time: 11/24/14  3:57 PM  Result Value Ref Range Status   Specimen  Description BLOOD LEFT ARM  Final   Special Requests   Final    BOTTLES DRAWN AEROBIC AND ANAEROBIC 10CC BOTH BOTTLES   Culture   Final    NO GROWTH < 24 HOURS Performed at [facility]    Report Status PENDING  Incomplete  Culture, sputum-assessment     Status: None   Collection Time: 11/24/14  5:01 PM  Result Value Ref Range Status   Specimen Description SPUTUM  Final   Special Requests NONE  Final   Sputum evaluation THIS SPECIMEN IS ACCEPTABLE FOR SPUTUM CULTURE  Final   Report Status 11/24/2014 FINAL  Final  Culture, respiratory (NON-Expectorated)     Status: None (Preliminary  result)   Collection Time: 11/24/14  5:01 PM  Result Value Ref Range Status   Specimen Description SPUTUM  Final   Special Requests NONE  Final   Gram Stain   Final    FEW WBC PRESENT,BOTH PMN AND MONONUCLEAR NO SQUAMOUS EPITHELIAL CELLS SEEN RARE GRAM NEGATIVE RODS RARE GRAM POSITIVE RODS RARE GRAM POSITIVE COCCI    Culture   Final    Culture reincubated for better growth Performed at [facility]    Report Status PENDING  Incomplete     Labs: Basic Metabolic Panel:  Recent Labs Lab 11/24/14 1230 11/25/14 0415  NA 137 141  K 4.1 3.8  CL 101 102  CO2 26 31  GLUCOSE 312* 220*  BUN 15 15  CREATININE 0.81 0.67  CALCIUM 9.3 9.1   Liver Function Tests: No results for input(s): AST, ALT, ALKPHOS, BILITOT, PROT, ALBUMIN in the last 168 hours. No results for input(s): LIPASE, AMYLASE in the last 168 hours. No results for input(s): AMMONIA in the last 168 hours. CBC:  Recent Labs Lab 11/24/14 1230 11/25/14 0415  WBC 21.5* 15.6*  NEUTROABS 18.1*  --   HGB 11.7* 10.7*  HCT 38.1 35.9*  MCV 83.6 84.3  PLT 277 260   Cardiac Enzymes: No results for input(s): CKTOTAL, CKMB, CKMBINDEX, TROPONINI in the last 168 hours. BNP: BNP (last 3 results) No results for input(s): BNP in the last 8760 hours.  ProBNP (last 3 results) No results for input(s): PROBNP in the last 8760  hours.  CBG:  Recent Labs Lab 11/25/14 1148 11/25/14 1627 11/25/14 2120 11/26/14 0732 11/26/14 1159  GLUCAP 211* 200* 178* 224* 306*   Signed:  Velvet Bathe  [facility] 11/26/2014, 3:44 PM

## 2014-11-27 LAB — GLUCOSE, CAPILLARY: Glucose-Capillary: 198 mg/dL — ABNORMAL HIGH (ref 65–99)

## 2014-11-28 LAB — CULTURE, RESPIRATORY W GRAM STAIN

## 2014-11-28 LAB — CULTURE, RESPIRATORY: Culture: NORMAL

## 2014-11-29 LAB — CULTURE, BLOOD (ROUTINE X 2)
Culture: NO GROWTH
Culture: NO GROWTH

## 2015-06-06 DIAGNOSIS — F411 Generalized anxiety disorder: Secondary | ICD-10-CM | POA: Diagnosis not present

## 2015-08-20 DIAGNOSIS — F411 Generalized anxiety disorder: Secondary | ICD-10-CM | POA: Diagnosis not present

## 2016-03-02 DIAGNOSIS — F411 Generalized anxiety disorder: Secondary | ICD-10-CM | POA: Diagnosis not present

## 2016-03-16 DIAGNOSIS — D649 Anemia, unspecified: Secondary | ICD-10-CM | POA: Diagnosis not present

## 2016-03-16 DIAGNOSIS — E119 Type 2 diabetes mellitus without complications: Secondary | ICD-10-CM | POA: Diagnosis not present

## 2016-03-16 DIAGNOSIS — R634 Abnormal weight loss: Secondary | ICD-10-CM | POA: Diagnosis not present

## 2016-04-08 DIAGNOSIS — Z0000 Encounter for general adult medical examination without abnormal findings: Secondary | ICD-10-CM | POA: Diagnosis not present

## 2016-04-08 DIAGNOSIS — E119 Type 2 diabetes mellitus without complications: Secondary | ICD-10-CM | POA: Diagnosis not present

## 2016-04-08 DIAGNOSIS — Z01419 Encounter for gynecological examination (general) (routine) without abnormal findings: Secondary | ICD-10-CM | POA: Diagnosis not present

## 2016-04-16 DIAGNOSIS — Z1231 Encounter for screening mammogram for malignant neoplasm of breast: Secondary | ICD-10-CM | POA: Diagnosis not present

## 2016-05-07 DIAGNOSIS — E119 Type 2 diabetes mellitus without complications: Secondary | ICD-10-CM | POA: Diagnosis not present

## 2016-05-07 DIAGNOSIS — Z0000 Encounter for general adult medical examination without abnormal findings: Secondary | ICD-10-CM | POA: Diagnosis not present

## 2016-05-07 DIAGNOSIS — B349 Viral infection, unspecified: Secondary | ICD-10-CM | POA: Diagnosis not present

## 2016-05-07 DIAGNOSIS — F17210 Nicotine dependence, cigarettes, uncomplicated: Secondary | ICD-10-CM | POA: Diagnosis not present

## 2016-05-07 DIAGNOSIS — R350 Frequency of micturition: Secondary | ICD-10-CM | POA: Diagnosis not present

## 2016-05-21 DIAGNOSIS — F411 Generalized anxiety disorder: Secondary | ICD-10-CM | POA: Diagnosis not present

## 2016-06-05 DIAGNOSIS — J029 Acute pharyngitis, unspecified: Secondary | ICD-10-CM | POA: Diagnosis not present

## 2016-06-23 IMAGING — US US BREAST LTD UNI LEFT INC AXILLA
1 series · 11 of 11 positions shown · non-contrast
Comparison: Previous examinations.

CLINICAL DATA: Small masses felt by the patient in the left axilla
for the past week, decreased in size during that time. Followup
right breast probable asymmetrical fibroglandular tissue.

EXAM:
DIGITAL DIAGNOSTIC BILATERAL MAMMOGRAM WITH 3D TOMOSYNTHESIS WITH
CAD
ULTRASOUND LEFT BREAST

[Series 1: advbreast · 11 of 11 slices shown]
[im 1/11]
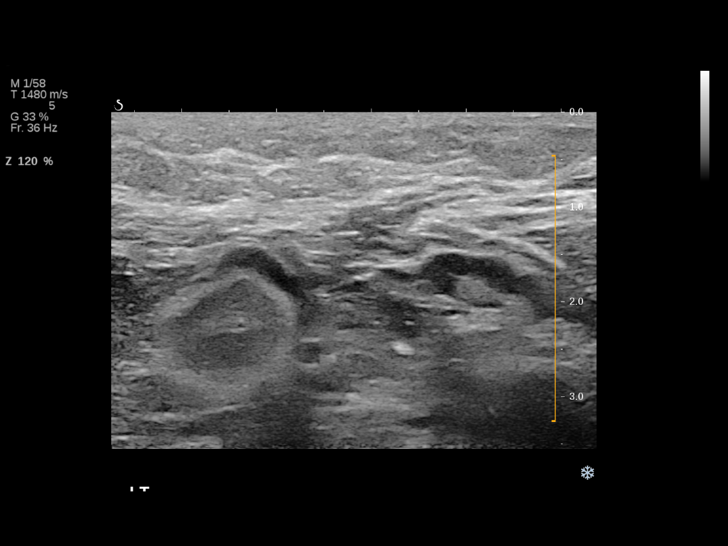
[im 2/11]
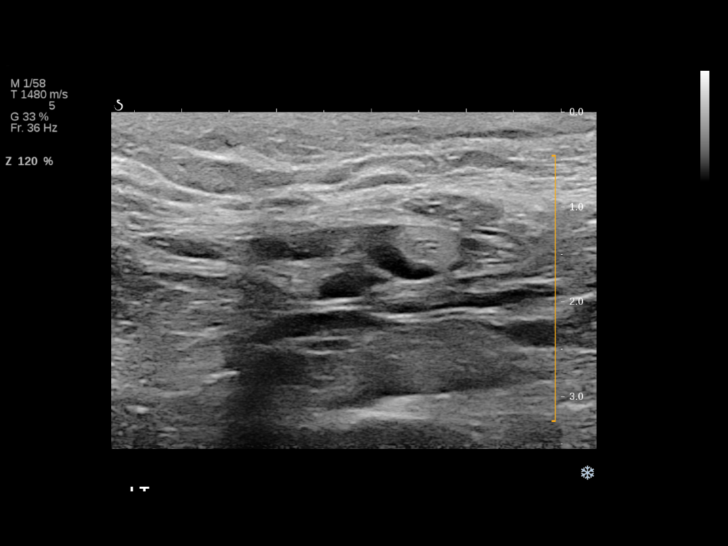
[im 3/11]
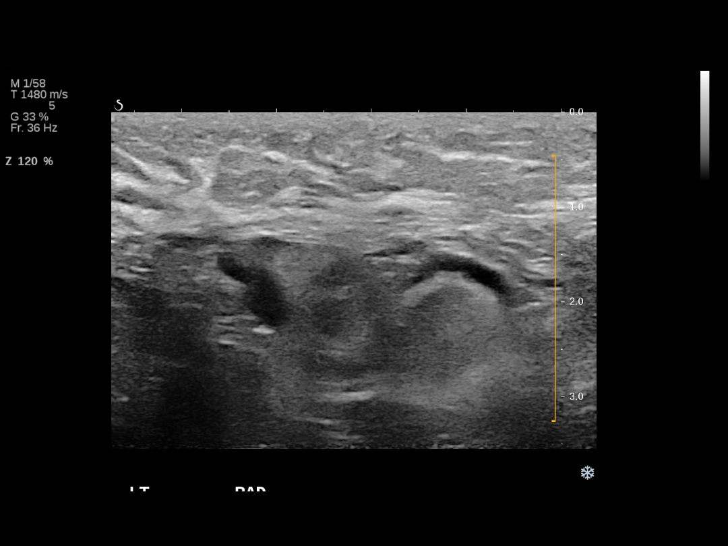
[im 4/11]
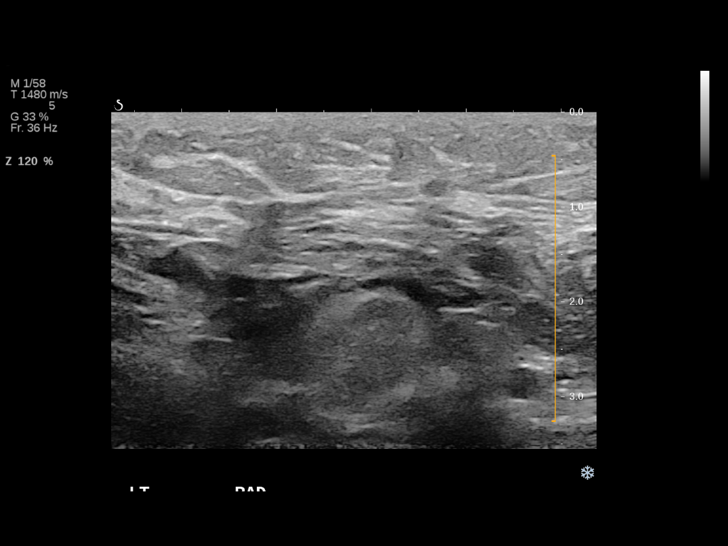
[im 5/11]
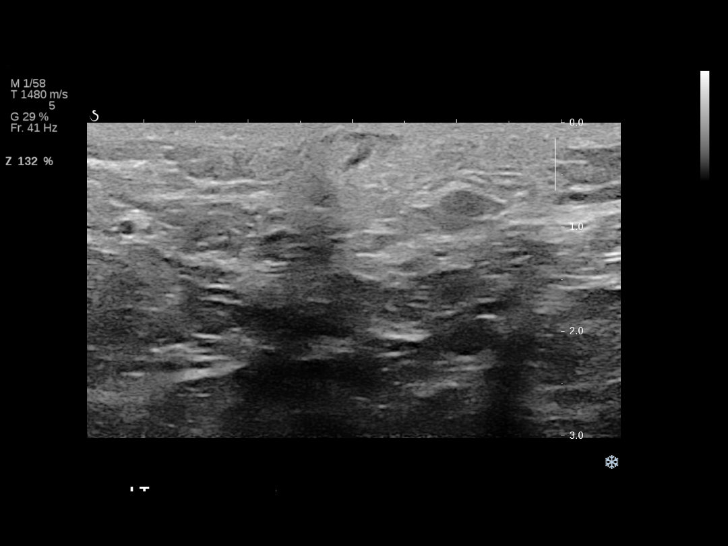
[im 6/11]
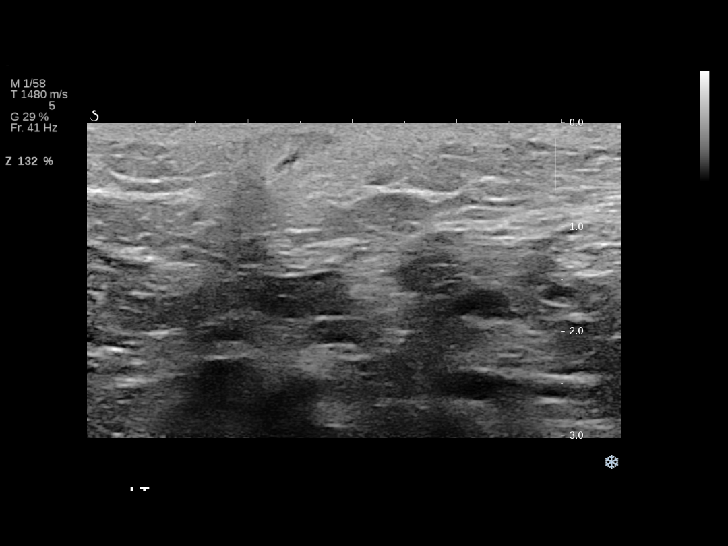
[im 7/11]
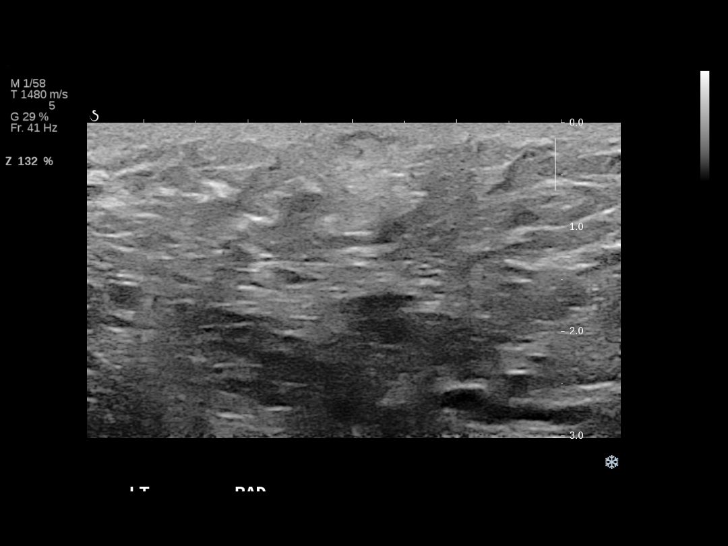
[im 8/11]
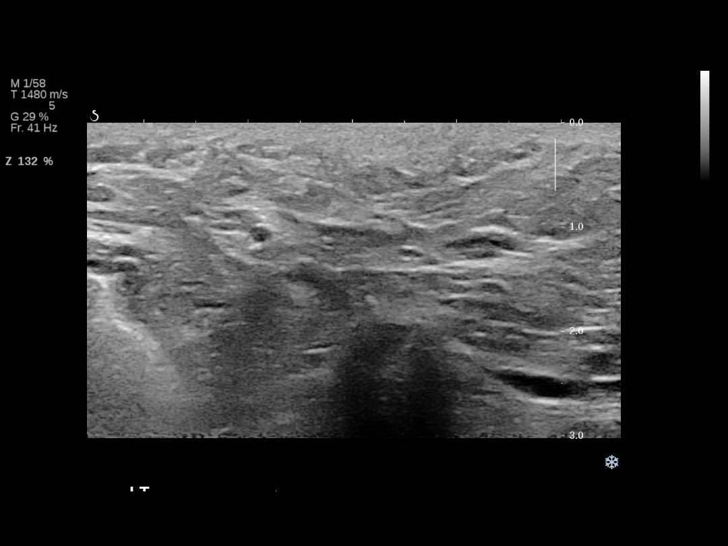
[im 9/11]
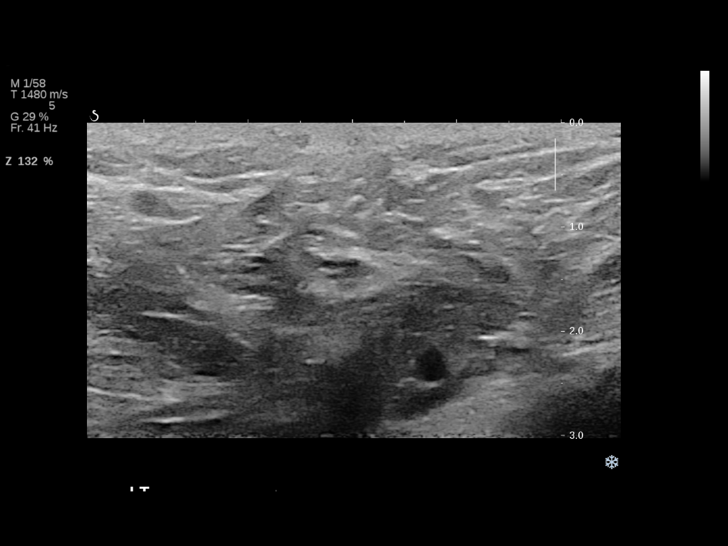
[im 10/11]
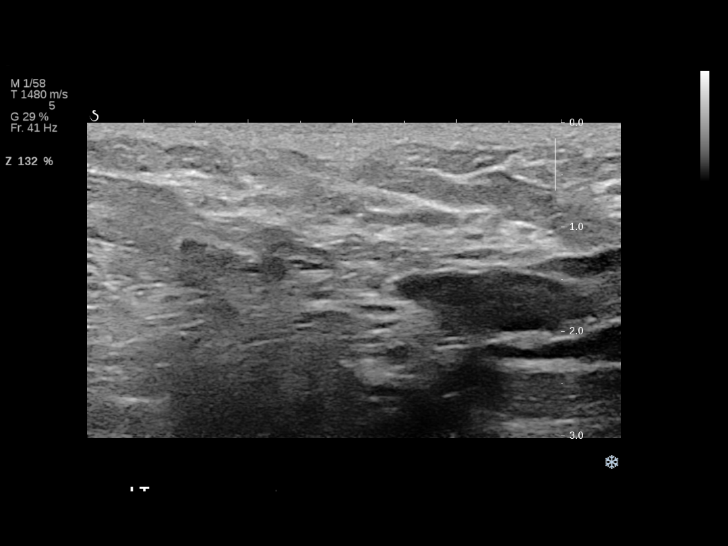
[im 11/11]
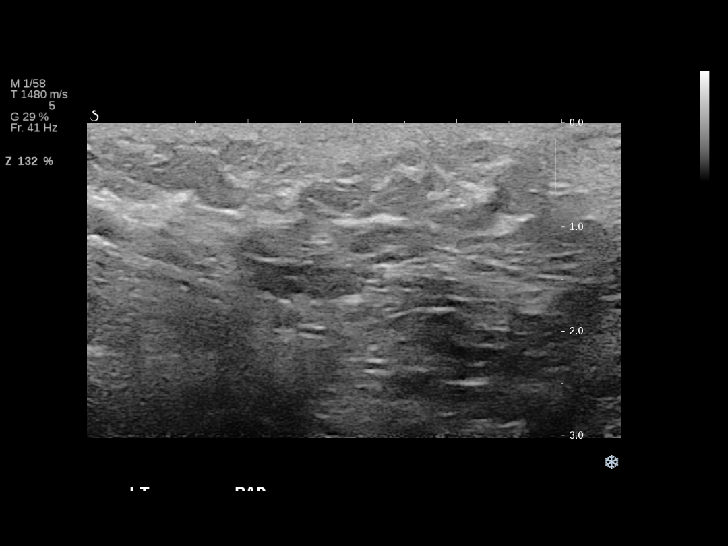

[11 of 11 positions shown; findings below may reference images not displayed]

ACR Breast Density Category c: The breast tissue is heterogeneously
dense, which may obscure small masses.
FINDINGS: The previously seen area of probable asymmetrical fibroglandular
tissue in the central, upper right breast, slightly medially, is
unchanged compared to previous examinations dating back to
08/25/2012. No new mammographic findings suspicious for malignancy
in either breast.

A spot tangential view of the left axilla demonstrates 2 areas of
ill-defined increased density just beneath the skin.

Mammographic images were processed with CAD.

On physical exam, the patient has 3 small, oval, palpable masses in
the skin in the left axilla. Two of these are dark red in
coloration.

Targeted ultrasound is performed, showing areas of predominantly
echogenic focal skin thickening corresponding to the palpable masses
in the left axilla. One of these contains a thin currently linear
hypoechoic component. Normal appearing left axillary lymph nodes are
also noted.
IMPRESSION: 1. Three areas of focal skin infection in the left axilla. The
patient was instructed to apply Neosporin ointment twice a day to
these areas until clear.
2. Stable right breast asymmetrical fibroglandular tissue. The
stability over 2 years is compatible with a benign process.
3. No evidence of malignancy.

RECOMMENDATION:
1. Bilateral screening mammogram in 1 year.
2. Clinical followup for the areas of focal skin infection in the
left axilla.

I have discussed the findings and recommendations with the patient.
Results were also provided in writing at the conclusion of the
visit. If applicable, a reminder letter will be sent to the patient
regarding the next appointment.

BI-RADS CATEGORY  2: Benign.

## 2016-07-02 DIAGNOSIS — E119 Type 2 diabetes mellitus without complications: Secondary | ICD-10-CM | POA: Diagnosis not present

## 2016-08-12 DIAGNOSIS — F411 Generalized anxiety disorder: Secondary | ICD-10-CM | POA: Diagnosis not present

## 2016-10-12 DIAGNOSIS — L02411 Cutaneous abscess of right axilla: Secondary | ICD-10-CM | POA: Diagnosis not present

## 2016-10-29 DIAGNOSIS — F411 Generalized anxiety disorder: Secondary | ICD-10-CM | POA: Diagnosis not present

## 2017-02-02 DIAGNOSIS — F411 Generalized anxiety disorder: Secondary | ICD-10-CM | POA: Diagnosis not present

## 2017-04-20 DIAGNOSIS — F411 Generalized anxiety disorder: Secondary | ICD-10-CM | POA: Diagnosis not present

## 2017-08-09 DIAGNOSIS — F250 Schizoaffective disorder, bipolar type: Secondary | ICD-10-CM | POA: Diagnosis not present

## 2017-08-16 DIAGNOSIS — Z049 Encounter for examination and observation for unspecified reason: Secondary | ICD-10-CM | POA: Diagnosis not present

## 2017-08-16 DIAGNOSIS — R634 Abnormal weight loss: Secondary | ICD-10-CM | POA: Diagnosis not present

## 2017-08-16 DIAGNOSIS — E113513 Type 2 diabetes mellitus with proliferative diabetic retinopathy with macular edema, bilateral: Secondary | ICD-10-CM | POA: Diagnosis not present

## 2017-08-16 DIAGNOSIS — Z1211 Encounter for screening for malignant neoplasm of colon: Secondary | ICD-10-CM | POA: Diagnosis not present

## 2017-08-16 DIAGNOSIS — R7301 Impaired fasting glucose: Secondary | ICD-10-CM | POA: Diagnosis not present

## 2017-08-16 DIAGNOSIS — A64 Unspecified sexually transmitted disease: Secondary | ICD-10-CM | POA: Diagnosis not present

## 2017-08-16 DIAGNOSIS — L400 Psoriasis vulgaris: Secondary | ICD-10-CM | POA: Diagnosis not present

## 2017-08-16 DIAGNOSIS — Z79899 Other long term (current) drug therapy: Secondary | ICD-10-CM | POA: Diagnosis not present

## 2017-08-16 DIAGNOSIS — D508 Other iron deficiency anemias: Secondary | ICD-10-CM | POA: Diagnosis not present

## 2017-08-16 DIAGNOSIS — Z0000 Encounter for general adult medical examination without abnormal findings: Secondary | ICD-10-CM | POA: Diagnosis not present

## 2017-08-16 DIAGNOSIS — E119 Type 2 diabetes mellitus without complications: Secondary | ICD-10-CM | POA: Diagnosis not present

## 2017-11-02 DIAGNOSIS — F411 Generalized anxiety disorder: Secondary | ICD-10-CM | POA: Diagnosis not present

## 2017-11-02 DIAGNOSIS — F250 Schizoaffective disorder, bipolar type: Secondary | ICD-10-CM | POA: Diagnosis not present

## 2018-02-08 DIAGNOSIS — F250 Schizoaffective disorder, bipolar type: Secondary | ICD-10-CM | POA: Diagnosis not present

## 2018-02-08 DIAGNOSIS — F411 Generalized anxiety disorder: Secondary | ICD-10-CM | POA: Diagnosis not present

## 2018-03-25 DIAGNOSIS — R233 Spontaneous ecchymoses: Secondary | ICD-10-CM | POA: Diagnosis not present

## 2018-03-25 DIAGNOSIS — E119 Type 2 diabetes mellitus without complications: Secondary | ICD-10-CM | POA: Diagnosis not present

## 2018-09-06 DIAGNOSIS — F411 Generalized anxiety disorder: Secondary | ICD-10-CM | POA: Diagnosis not present

## 2018-09-06 DIAGNOSIS — F3132 Bipolar disorder, current episode depressed, moderate: Secondary | ICD-10-CM | POA: Diagnosis not present

## 2018-09-06 DIAGNOSIS — F250 Schizoaffective disorder, bipolar type: Secondary | ICD-10-CM | POA: Diagnosis not present

## 2018-12-09 DIAGNOSIS — F3132 Bipolar disorder, current episode depressed, moderate: Secondary | ICD-10-CM | POA: Diagnosis not present

## 2018-12-09 DIAGNOSIS — F411 Generalized anxiety disorder: Secondary | ICD-10-CM | POA: Diagnosis not present

## 2018-12-09 DIAGNOSIS — F250 Schizoaffective disorder, bipolar type: Secondary | ICD-10-CM | POA: Diagnosis not present

## 2019-01-21 DIAGNOSIS — R079 Chest pain, unspecified: Secondary | ICD-10-CM | POA: Diagnosis not present

## 2019-01-21 DIAGNOSIS — E119 Type 2 diabetes mellitus without complications: Secondary | ICD-10-CM | POA: Diagnosis not present

## 2019-01-21 DIAGNOSIS — R112 Nausea with vomiting, unspecified: Secondary | ICD-10-CM | POA: Diagnosis not present

## 2019-01-21 DIAGNOSIS — I10 Essential (primary) hypertension: Secondary | ICD-10-CM | POA: Diagnosis not present

## 2019-01-21 DIAGNOSIS — R7989 Other specified abnormal findings of blood chemistry: Secondary | ICD-10-CM | POA: Diagnosis not present

## 2019-01-21 DIAGNOSIS — R1084 Generalized abdominal pain: Secondary | ICD-10-CM | POA: Diagnosis not present

## 2019-01-21 DIAGNOSIS — R0602 Shortness of breath: Secondary | ICD-10-CM | POA: Diagnosis not present

## 2019-01-21 DIAGNOSIS — R072 Precordial pain: Secondary | ICD-10-CM | POA: Diagnosis not present

## 2019-01-22 DIAGNOSIS — R9431 Abnormal electrocardiogram [ECG] [EKG]: Secondary | ICD-10-CM | POA: Diagnosis not present

## 2019-01-22 DIAGNOSIS — R112 Nausea with vomiting, unspecified: Secondary | ICD-10-CM | POA: Diagnosis not present

## 2019-01-22 DIAGNOSIS — I4510 Unspecified right bundle-branch block: Secondary | ICD-10-CM | POA: Diagnosis not present

## 2019-01-22 DIAGNOSIS — R7989 Other specified abnormal findings of blood chemistry: Secondary | ICD-10-CM | POA: Diagnosis not present

## 2019-01-22 DIAGNOSIS — R079 Chest pain, unspecified: Secondary | ICD-10-CM | POA: Diagnosis not present

## 2019-01-22 DIAGNOSIS — R1110 Vomiting, unspecified: Secondary | ICD-10-CM | POA: Diagnosis not present

## 2019-01-23 DIAGNOSIS — R1909 Other intra-abdominal and pelvic swelling, mass and lump: Secondary | ICD-10-CM | POA: Diagnosis not present

## 2019-01-23 DIAGNOSIS — R1084 Generalized abdominal pain: Secondary | ICD-10-CM | POA: Diagnosis not present

## 2019-01-23 DIAGNOSIS — L989 Disorder of the skin and subcutaneous tissue, unspecified: Secondary | ICD-10-CM | POA: Diagnosis not present

## 2019-01-23 DIAGNOSIS — R10814 Left lower quadrant abdominal tenderness: Secondary | ICD-10-CM | POA: Diagnosis not present

## 2019-01-23 DIAGNOSIS — R112 Nausea with vomiting, unspecified: Secondary | ICD-10-CM | POA: Diagnosis not present

## 2019-01-23 DIAGNOSIS — E119 Type 2 diabetes mellitus without complications: Secondary | ICD-10-CM | POA: Diagnosis not present

## 2019-01-23 DIAGNOSIS — R0609 Other forms of dyspnea: Secondary | ICD-10-CM | POA: Diagnosis not present

## 2019-01-23 DIAGNOSIS — R7989 Other specified abnormal findings of blood chemistry: Secondary | ICD-10-CM | POA: Diagnosis not present

## 2019-01-23 DIAGNOSIS — R079 Chest pain, unspecified: Secondary | ICD-10-CM | POA: Diagnosis not present

## 2019-01-23 DIAGNOSIS — R51 Headache: Secondary | ICD-10-CM | POA: Diagnosis not present

## 2019-01-26 DIAGNOSIS — Z1231 Encounter for screening mammogram for malignant neoplasm of breast: Secondary | ICD-10-CM | POA: Diagnosis not present

## 2019-01-26 DIAGNOSIS — Z124 Encounter for screening for malignant neoplasm of cervix: Secondary | ICD-10-CM | POA: Diagnosis not present

## 2019-01-26 DIAGNOSIS — C561 Malignant neoplasm of right ovary: Secondary | ICD-10-CM | POA: Diagnosis not present

## 2019-01-26 DIAGNOSIS — N838 Other noninflammatory disorders of ovary, fallopian tube and broad ligament: Secondary | ICD-10-CM | POA: Diagnosis not present

## 2019-01-26 DIAGNOSIS — Z1151 Encounter for screening for human papillomavirus (HPV): Secondary | ICD-10-CM | POA: Diagnosis not present

## 2019-01-26 DIAGNOSIS — Z1211 Encounter for screening for malignant neoplasm of colon: Secondary | ICD-10-CM | POA: Diagnosis not present

## 2019-01-30 DIAGNOSIS — Z91040 Latex allergy status: Secondary | ICD-10-CM | POA: Diagnosis not present

## 2019-01-30 DIAGNOSIS — Z888 Allergy status to other drugs, medicaments and biological substances status: Secondary | ICD-10-CM | POA: Diagnosis not present

## 2019-01-30 DIAGNOSIS — N838 Other noninflammatory disorders of ovary, fallopian tube and broad ligament: Secondary | ICD-10-CM | POA: Diagnosis not present

## 2019-01-30 DIAGNOSIS — Z780 Asymptomatic menopausal state: Secondary | ICD-10-CM | POA: Diagnosis not present

## 2019-01-31 DIAGNOSIS — Z90721 Acquired absence of ovaries, unilateral: Secondary | ICD-10-CM | POA: Insufficient documentation

## 2019-01-31 HISTORY — PX: ABDOMINAL HYSTERECTOMY: SHX81

## 2019-02-08 DIAGNOSIS — Z888 Allergy status to other drugs, medicaments and biological substances status: Secondary | ICD-10-CM | POA: Diagnosis not present

## 2019-02-08 DIAGNOSIS — Z91040 Latex allergy status: Secondary | ICD-10-CM | POA: Diagnosis not present

## 2019-02-08 DIAGNOSIS — D259 Leiomyoma of uterus, unspecified: Secondary | ICD-10-CM | POA: Diagnosis not present

## 2019-02-08 DIAGNOSIS — E1165 Type 2 diabetes mellitus with hyperglycemia: Secondary | ICD-10-CM | POA: Diagnosis not present

## 2019-02-08 DIAGNOSIS — Z20828 Contact with and (suspected) exposure to other viral communicable diseases: Secondary | ICD-10-CM | POA: Diagnosis not present

## 2019-02-08 DIAGNOSIS — I10 Essential (primary) hypertension: Secondary | ICD-10-CM | POA: Diagnosis not present

## 2019-02-08 DIAGNOSIS — Z801 Family history of malignant neoplasm of trachea, bronchus and lung: Secondary | ICD-10-CM | POA: Diagnosis not present

## 2019-02-08 DIAGNOSIS — N736 Female pelvic peritoneal adhesions (postinfective): Secondary | ICD-10-CM | POA: Diagnosis not present

## 2019-02-08 DIAGNOSIS — Z8249 Family history of ischemic heart disease and other diseases of the circulatory system: Secondary | ICD-10-CM | POA: Diagnosis not present

## 2019-02-08 DIAGNOSIS — Z79899 Other long term (current) drug therapy: Secondary | ICD-10-CM | POA: Diagnosis not present

## 2019-02-08 DIAGNOSIS — Z800 Family history of malignant neoplasm of digestive organs: Secondary | ICD-10-CM | POA: Diagnosis not present

## 2019-02-08 DIAGNOSIS — F419 Anxiety disorder, unspecified: Secondary | ICD-10-CM | POA: Diagnosis not present

## 2019-02-08 DIAGNOSIS — F319 Bipolar disorder, unspecified: Secondary | ICD-10-CM | POA: Diagnosis not present

## 2019-02-08 DIAGNOSIS — E785 Hyperlipidemia, unspecified: Secondary | ICD-10-CM | POA: Diagnosis not present

## 2019-02-08 DIAGNOSIS — Z7984 Long term (current) use of oral hypoglycemic drugs: Secondary | ICD-10-CM | POA: Diagnosis not present

## 2019-02-08 DIAGNOSIS — Z87891 Personal history of nicotine dependence: Secondary | ICD-10-CM | POA: Diagnosis not present

## 2019-02-08 DIAGNOSIS — Z01419 Encounter for gynecological examination (general) (routine) without abnormal findings: Secondary | ICD-10-CM | POA: Diagnosis not present

## 2019-02-08 DIAGNOSIS — J449 Chronic obstructive pulmonary disease, unspecified: Secondary | ICD-10-CM | POA: Diagnosis not present

## 2019-02-08 DIAGNOSIS — Z9181 History of falling: Secondary | ICD-10-CM | POA: Diagnosis not present

## 2019-02-08 DIAGNOSIS — Z7982 Long term (current) use of aspirin: Secondary | ICD-10-CM | POA: Diagnosis not present

## 2019-02-08 DIAGNOSIS — Z803 Family history of malignant neoplasm of breast: Secondary | ICD-10-CM | POA: Diagnosis not present

## 2019-02-09 DIAGNOSIS — N838 Other noninflammatory disorders of ovary, fallopian tube and broad ligament: Secondary | ICD-10-CM | POA: Diagnosis not present

## 2019-02-09 DIAGNOSIS — R7981 Abnormal blood-gas level: Secondary | ICD-10-CM | POA: Diagnosis not present

## 2019-02-09 DIAGNOSIS — Z01812 Encounter for preprocedural laboratory examination: Secondary | ICD-10-CM | POA: Diagnosis not present

## 2019-02-09 DIAGNOSIS — Z20828 Contact with and (suspected) exposure to other viral communicable diseases: Secondary | ICD-10-CM | POA: Diagnosis not present

## 2019-02-14 DIAGNOSIS — D250 Submucous leiomyoma of uterus: Secondary | ICD-10-CM | POA: Diagnosis not present

## 2019-02-14 DIAGNOSIS — N838 Other noninflammatory disorders of ovary, fallopian tube and broad ligament: Secondary | ICD-10-CM | POA: Diagnosis not present

## 2019-02-14 DIAGNOSIS — Z79899 Other long term (current) drug therapy: Secondary | ICD-10-CM | POA: Diagnosis not present

## 2019-02-14 DIAGNOSIS — Z87891 Personal history of nicotine dependence: Secondary | ICD-10-CM | POA: Diagnosis not present

## 2019-02-14 DIAGNOSIS — I10 Essential (primary) hypertension: Secondary | ICD-10-CM | POA: Diagnosis present

## 2019-02-14 DIAGNOSIS — F319 Bipolar disorder, unspecified: Secondary | ICD-10-CM | POA: Diagnosis present

## 2019-02-14 DIAGNOSIS — Z7984 Long term (current) use of oral hypoglycemic drugs: Secondary | ICD-10-CM | POA: Diagnosis not present

## 2019-02-14 DIAGNOSIS — Z800 Family history of malignant neoplasm of digestive organs: Secondary | ICD-10-CM | POA: Diagnosis not present

## 2019-02-14 DIAGNOSIS — R1900 Intra-abdominal and pelvic swelling, mass and lump, unspecified site: Secondary | ICD-10-CM | POA: Diagnosis not present

## 2019-02-14 DIAGNOSIS — Z7982 Long term (current) use of aspirin: Secondary | ICD-10-CM | POA: Diagnosis not present

## 2019-02-14 DIAGNOSIS — G8918 Other acute postprocedural pain: Secondary | ICD-10-CM | POA: Diagnosis not present

## 2019-02-14 DIAGNOSIS — Z20828 Contact with and (suspected) exposure to other viral communicable diseases: Secondary | ICD-10-CM | POA: Diagnosis present

## 2019-02-14 DIAGNOSIS — N888 Other specified noninflammatory disorders of cervix uteri: Secondary | ICD-10-CM | POA: Diagnosis not present

## 2019-02-14 DIAGNOSIS — Z803 Family history of malignant neoplasm of breast: Secondary | ICD-10-CM | POA: Diagnosis not present

## 2019-02-14 DIAGNOSIS — Z9181 History of falling: Secondary | ICD-10-CM | POA: Diagnosis not present

## 2019-02-14 DIAGNOSIS — N736 Female pelvic peritoneal adhesions (postinfective): Secondary | ICD-10-CM | POA: Diagnosis present

## 2019-02-14 DIAGNOSIS — E785 Hyperlipidemia, unspecified: Secondary | ICD-10-CM | POA: Diagnosis present

## 2019-02-14 DIAGNOSIS — Z801 Family history of malignant neoplasm of trachea, bronchus and lung: Secondary | ICD-10-CM | POA: Diagnosis not present

## 2019-02-14 DIAGNOSIS — N72 Inflammatory disease of cervix uteri: Secondary | ICD-10-CM | POA: Diagnosis not present

## 2019-02-14 DIAGNOSIS — D252 Subserosal leiomyoma of uterus: Secondary | ICD-10-CM | POA: Diagnosis not present

## 2019-02-14 DIAGNOSIS — Z888 Allergy status to other drugs, medicaments and biological substances status: Secondary | ICD-10-CM | POA: Diagnosis not present

## 2019-02-14 DIAGNOSIS — J449 Chronic obstructive pulmonary disease, unspecified: Secondary | ICD-10-CM | POA: Diagnosis present

## 2019-02-14 DIAGNOSIS — Z8249 Family history of ischemic heart disease and other diseases of the circulatory system: Secondary | ICD-10-CM | POA: Diagnosis not present

## 2019-02-14 DIAGNOSIS — Z91040 Latex allergy status: Secondary | ICD-10-CM | POA: Diagnosis not present

## 2019-02-14 DIAGNOSIS — N135 Crossing vessel and stricture of ureter without hydronephrosis: Secondary | ICD-10-CM | POA: Diagnosis not present

## 2019-02-14 DIAGNOSIS — D251 Intramural leiomyoma of uterus: Secondary | ICD-10-CM | POA: Diagnosis not present

## 2019-02-14 DIAGNOSIS — F419 Anxiety disorder, unspecified: Secondary | ICD-10-CM | POA: Diagnosis present

## 2019-02-14 DIAGNOSIS — D259 Leiomyoma of uterus, unspecified: Secondary | ICD-10-CM | POA: Diagnosis present

## 2019-02-14 DIAGNOSIS — R918 Other nonspecific abnormal finding of lung field: Secondary | ICD-10-CM | POA: Diagnosis not present

## 2019-02-14 DIAGNOSIS — E1165 Type 2 diabetes mellitus with hyperglycemia: Secondary | ICD-10-CM | POA: Diagnosis not present

## 2019-02-27 DIAGNOSIS — M797 Fibromyalgia: Secondary | ICD-10-CM | POA: Insufficient documentation

## 2019-02-27 DIAGNOSIS — F310 Bipolar disorder, current episode hypomanic: Secondary | ICD-10-CM | POA: Diagnosis not present

## 2019-02-27 DIAGNOSIS — Z7984 Long term (current) use of oral hypoglycemic drugs: Secondary | ICD-10-CM | POA: Diagnosis not present

## 2019-02-27 DIAGNOSIS — E1142 Type 2 diabetes mellitus with diabetic polyneuropathy: Secondary | ICD-10-CM | POA: Diagnosis not present

## 2019-02-27 DIAGNOSIS — Z1239 Encounter for other screening for malignant neoplasm of breast: Secondary | ICD-10-CM | POA: Diagnosis not present

## 2019-02-27 DIAGNOSIS — Z2821 Immunization not carried out because of patient refusal: Secondary | ICD-10-CM | POA: Diagnosis not present

## 2019-02-27 DIAGNOSIS — F418 Other specified anxiety disorders: Secondary | ICD-10-CM | POA: Diagnosis not present

## 2019-02-27 DIAGNOSIS — L989 Disorder of the skin and subcutaneous tissue, unspecified: Secondary | ICD-10-CM | POA: Diagnosis not present

## 2019-02-27 DIAGNOSIS — I10 Essential (primary) hypertension: Secondary | ICD-10-CM | POA: Diagnosis not present

## 2019-02-27 DIAGNOSIS — Z7689 Persons encountering health services in other specified circumstances: Secondary | ICD-10-CM | POA: Diagnosis not present

## 2019-02-27 DIAGNOSIS — T8149XA Infection following a procedure, other surgical site, initial encounter: Secondary | ICD-10-CM | POA: Diagnosis not present

## 2019-03-01 DIAGNOSIS — T8141XA Infection following a procedure, superficial incisional surgical site, initial encounter: Secondary | ICD-10-CM | POA: Diagnosis not present

## 2019-03-02 ENCOUNTER — Other Ambulatory Visit: Payer: Self-pay | Admitting: Nurse Practitioner

## 2019-03-02 DIAGNOSIS — Z1231 Encounter for screening mammogram for malignant neoplasm of breast: Secondary | ICD-10-CM

## 2019-03-06 DIAGNOSIS — T8141XD Infection following a procedure, superficial incisional surgical site, subsequent encounter: Secondary | ICD-10-CM | POA: Diagnosis not present

## 2019-03-06 DIAGNOSIS — N838 Other noninflammatory disorders of ovary, fallopian tube and broad ligament: Secondary | ICD-10-CM | POA: Diagnosis not present

## 2019-03-06 DIAGNOSIS — I10 Essential (primary) hypertension: Secondary | ICD-10-CM | POA: Diagnosis not present

## 2019-03-06 DIAGNOSIS — M6281 Muscle weakness (generalized): Secondary | ICD-10-CM | POA: Diagnosis not present

## 2019-03-06 DIAGNOSIS — Z741 Need for assistance with personal care: Secondary | ICD-10-CM | POA: Diagnosis not present

## 2019-03-06 DIAGNOSIS — Z90721 Acquired absence of ovaries, unilateral: Secondary | ICD-10-CM | POA: Diagnosis not present

## 2019-03-06 DIAGNOSIS — R2681 Unsteadiness on feet: Secondary | ICD-10-CM | POA: Diagnosis not present

## 2019-03-06 DIAGNOSIS — F319 Bipolar disorder, unspecified: Secondary | ICD-10-CM | POA: Diagnosis not present

## 2019-03-06 DIAGNOSIS — Z90710 Acquired absence of both cervix and uterus: Secondary | ICD-10-CM | POA: Diagnosis not present

## 2019-03-06 DIAGNOSIS — D251 Intramural leiomyoma of uterus: Secondary | ICD-10-CM | POA: Diagnosis not present

## 2019-03-06 DIAGNOSIS — E1142 Type 2 diabetes mellitus with diabetic polyneuropathy: Secondary | ICD-10-CM | POA: Diagnosis not present

## 2019-03-06 DIAGNOSIS — F17210 Nicotine dependence, cigarettes, uncomplicated: Secondary | ICD-10-CM | POA: Diagnosis not present

## 2019-03-06 DIAGNOSIS — Z7984 Long term (current) use of oral hypoglycemic drugs: Secondary | ICD-10-CM | POA: Diagnosis not present

## 2019-03-08 DIAGNOSIS — N838 Other noninflammatory disorders of ovary, fallopian tube and broad ligament: Secondary | ICD-10-CM | POA: Diagnosis not present

## 2019-03-08 DIAGNOSIS — F319 Bipolar disorder, unspecified: Secondary | ICD-10-CM | POA: Diagnosis not present

## 2019-03-08 DIAGNOSIS — T8141XD Infection following a procedure, superficial incisional surgical site, subsequent encounter: Secondary | ICD-10-CM | POA: Diagnosis not present

## 2019-03-08 DIAGNOSIS — E1142 Type 2 diabetes mellitus with diabetic polyneuropathy: Secondary | ICD-10-CM | POA: Diagnosis not present

## 2019-03-08 DIAGNOSIS — Z7984 Long term (current) use of oral hypoglycemic drugs: Secondary | ICD-10-CM | POA: Diagnosis not present

## 2019-03-08 DIAGNOSIS — D251 Intramural leiomyoma of uterus: Secondary | ICD-10-CM | POA: Diagnosis not present

## 2019-03-09 DIAGNOSIS — T8141XD Infection following a procedure, superficial incisional surgical site, subsequent encounter: Secondary | ICD-10-CM | POA: Diagnosis not present

## 2019-03-09 DIAGNOSIS — D251 Intramural leiomyoma of uterus: Secondary | ICD-10-CM | POA: Diagnosis not present

## 2019-03-09 DIAGNOSIS — N838 Other noninflammatory disorders of ovary, fallopian tube and broad ligament: Secondary | ICD-10-CM | POA: Diagnosis not present

## 2019-03-09 DIAGNOSIS — Z7984 Long term (current) use of oral hypoglycemic drugs: Secondary | ICD-10-CM | POA: Diagnosis not present

## 2019-03-09 DIAGNOSIS — F319 Bipolar disorder, unspecified: Secondary | ICD-10-CM | POA: Diagnosis not present

## 2019-03-09 DIAGNOSIS — E1142 Type 2 diabetes mellitus with diabetic polyneuropathy: Secondary | ICD-10-CM | POA: Diagnosis not present

## 2019-03-15 DIAGNOSIS — T8141XD Infection following a procedure, superficial incisional surgical site, subsequent encounter: Secondary | ICD-10-CM | POA: Diagnosis not present

## 2019-03-15 DIAGNOSIS — N838 Other noninflammatory disorders of ovary, fallopian tube and broad ligament: Secondary | ICD-10-CM | POA: Diagnosis not present

## 2019-03-15 DIAGNOSIS — F319 Bipolar disorder, unspecified: Secondary | ICD-10-CM | POA: Diagnosis not present

## 2019-03-15 DIAGNOSIS — D251 Intramural leiomyoma of uterus: Secondary | ICD-10-CM | POA: Diagnosis not present

## 2019-03-15 DIAGNOSIS — Z7984 Long term (current) use of oral hypoglycemic drugs: Secondary | ICD-10-CM | POA: Diagnosis not present

## 2019-03-15 DIAGNOSIS — E1142 Type 2 diabetes mellitus with diabetic polyneuropathy: Secondary | ICD-10-CM | POA: Diagnosis not present

## 2019-03-16 DIAGNOSIS — E1142 Type 2 diabetes mellitus with diabetic polyneuropathy: Secondary | ICD-10-CM | POA: Diagnosis not present

## 2019-03-16 DIAGNOSIS — F319 Bipolar disorder, unspecified: Secondary | ICD-10-CM | POA: Diagnosis not present

## 2019-03-16 DIAGNOSIS — T8141XD Infection following a procedure, superficial incisional surgical site, subsequent encounter: Secondary | ICD-10-CM | POA: Diagnosis not present

## 2019-03-16 DIAGNOSIS — D251 Intramural leiomyoma of uterus: Secondary | ICD-10-CM | POA: Diagnosis not present

## 2019-03-16 DIAGNOSIS — Z7984 Long term (current) use of oral hypoglycemic drugs: Secondary | ICD-10-CM | POA: Diagnosis not present

## 2019-03-16 DIAGNOSIS — N838 Other noninflammatory disorders of ovary, fallopian tube and broad ligament: Secondary | ICD-10-CM | POA: Diagnosis not present

## 2019-03-20 DIAGNOSIS — Z9079 Acquired absence of other genital organ(s): Secondary | ICD-10-CM | POA: Diagnosis not present

## 2019-03-20 DIAGNOSIS — Z48816 Encounter for surgical aftercare following surgery on the genitourinary system: Secondary | ICD-10-CM | POA: Diagnosis not present

## 2019-03-20 DIAGNOSIS — Z90721 Acquired absence of ovaries, unilateral: Secondary | ICD-10-CM | POA: Diagnosis not present

## 2019-03-20 DIAGNOSIS — Z8739 Personal history of other diseases of the musculoskeletal system and connective tissue: Secondary | ICD-10-CM | POA: Diagnosis not present

## 2019-03-20 DIAGNOSIS — Z90710 Acquired absence of both cervix and uterus: Secondary | ICD-10-CM | POA: Diagnosis not present

## 2019-03-22 DIAGNOSIS — R000 Tachycardia, unspecified: Secondary | ICD-10-CM | POA: Diagnosis not present

## 2019-03-22 DIAGNOSIS — R1084 Generalized abdominal pain: Secondary | ICD-10-CM | POA: Diagnosis not present

## 2019-03-22 DIAGNOSIS — E1165 Type 2 diabetes mellitus with hyperglycemia: Secondary | ICD-10-CM | POA: Diagnosis not present

## 2019-03-22 DIAGNOSIS — R112 Nausea with vomiting, unspecified: Secondary | ICD-10-CM | POA: Diagnosis not present

## 2019-03-22 DIAGNOSIS — R109 Unspecified abdominal pain: Secondary | ICD-10-CM | POA: Diagnosis not present

## 2019-03-22 DIAGNOSIS — R519 Headache, unspecified: Secondary | ICD-10-CM | POA: Diagnosis not present

## 2019-03-22 DIAGNOSIS — K529 Noninfective gastroenteritis and colitis, unspecified: Secondary | ICD-10-CM | POA: Diagnosis not present

## 2019-03-22 DIAGNOSIS — Z90721 Acquired absence of ovaries, unilateral: Secondary | ICD-10-CM | POA: Diagnosis not present

## 2019-03-22 DIAGNOSIS — R1030 Lower abdominal pain, unspecified: Secondary | ICD-10-CM | POA: Diagnosis not present

## 2019-03-22 DIAGNOSIS — R52 Pain, unspecified: Secondary | ICD-10-CM | POA: Diagnosis not present

## 2019-03-22 DIAGNOSIS — Z90710 Acquired absence of both cervix and uterus: Secondary | ICD-10-CM | POA: Diagnosis not present

## 2019-03-22 DIAGNOSIS — R0689 Other abnormalities of breathing: Secondary | ICD-10-CM | POA: Diagnosis not present

## 2019-03-23 DIAGNOSIS — L300 Nummular dermatitis: Secondary | ICD-10-CM | POA: Diagnosis not present

## 2019-03-23 DIAGNOSIS — L299 Pruritus, unspecified: Secondary | ICD-10-CM | POA: Diagnosis not present

## 2019-03-24 DIAGNOSIS — D251 Intramural leiomyoma of uterus: Secondary | ICD-10-CM | POA: Diagnosis not present

## 2019-03-24 DIAGNOSIS — F319 Bipolar disorder, unspecified: Secondary | ICD-10-CM | POA: Diagnosis not present

## 2019-03-24 DIAGNOSIS — T8141XD Infection following a procedure, superficial incisional surgical site, subsequent encounter: Secondary | ICD-10-CM | POA: Diagnosis not present

## 2019-03-24 DIAGNOSIS — N838 Other noninflammatory disorders of ovary, fallopian tube and broad ligament: Secondary | ICD-10-CM | POA: Diagnosis not present

## 2019-03-24 DIAGNOSIS — E1142 Type 2 diabetes mellitus with diabetic polyneuropathy: Secondary | ICD-10-CM | POA: Diagnosis not present

## 2019-03-24 DIAGNOSIS — Z7984 Long term (current) use of oral hypoglycemic drugs: Secondary | ICD-10-CM | POA: Diagnosis not present

## 2019-03-27 DIAGNOSIS — F319 Bipolar disorder, unspecified: Secondary | ICD-10-CM | POA: Diagnosis not present

## 2019-03-27 DIAGNOSIS — N838 Other noninflammatory disorders of ovary, fallopian tube and broad ligament: Secondary | ICD-10-CM | POA: Diagnosis not present

## 2019-03-27 DIAGNOSIS — D251 Intramural leiomyoma of uterus: Secondary | ICD-10-CM | POA: Diagnosis not present

## 2019-03-27 DIAGNOSIS — T8141XD Infection following a procedure, superficial incisional surgical site, subsequent encounter: Secondary | ICD-10-CM | POA: Diagnosis not present

## 2019-03-27 DIAGNOSIS — Z7984 Long term (current) use of oral hypoglycemic drugs: Secondary | ICD-10-CM | POA: Diagnosis not present

## 2019-03-27 DIAGNOSIS — E1142 Type 2 diabetes mellitus with diabetic polyneuropathy: Secondary | ICD-10-CM | POA: Diagnosis not present

## 2019-04-04 ENCOUNTER — Ambulatory Visit: Payer: Self-pay | Admitting: *Deleted

## 2019-04-04 NOTE — Telephone Encounter (Signed)
Pt called with having her blood sugar up to 462 yesterday.  She stated she was out of jardiance and has been out for about a week. She has a new patient appointment tomorrow at [facility].  Her blood sugar today is 401 after eating a half slice of pizza. She is also out of lancets. She stated that she is also having some tremors and feels like pins and needles are sticking in her skin. She is advised to drink water. She has been drinking Gatorade and now coffee.  She went to the dentist yesterday and has an infection and they refused to give her antibiotic. She also has ulcers on her skin that she is being treated for. She would go from one thing to another. She also stated her temp was 101.4. advised that she take tylenol or Advil for her fever and she has taken Advil.  Advised that she needs to go to the ED regarding her blood sugar. They would be able to treat her blood sugar. She should have someone drive her and drink a lot of water. She voiced understanding and will be going to the ED on 68. Routing to LB at [facility] for review.   Reason for Disposition . Nursing judgment or information in reference  Answer Assessment - Initial Assessment Questions 1. REASON FOR CALL: "What is your main concern right now?"     Blood sugar elevation 2. ONSET: "When did the increase in blood sugar start?"     Yesterday was up 3. SEVERITY: "How bad is the blood sugar?"     401 today 4. FEVER: "Do you have a fever?"     Yes  101.4 5. OTHER SYMPTOMS: "Do you have any other new symptoms?"     tremors and feels like pin and needles sticking in her skin 6. INTERVENTIONS AND RESPONSE: "What have you done so far to try to make this better? What medications have you used?"     Took Advil 7. PREGNANCY: "Is there any chance you are pregnant?"     n/a  Protocols used: NO GUIDELINE AVAILABLE-A-AH

## 2019-04-04 NOTE — Telephone Encounter (Signed)
fyi

## 2019-04-05 ENCOUNTER — Ambulatory Visit (INDEPENDENT_AMBULATORY_CARE_PROVIDER_SITE_OTHER): Payer: Medicare Other | Admitting: Family Medicine

## 2019-04-05 ENCOUNTER — Encounter: Payer: Self-pay | Admitting: Family Medicine

## 2019-04-05 VITALS — Ht 63.0 in

## 2019-04-05 DIAGNOSIS — E119 Type 2 diabetes mellitus without complications: Secondary | ICD-10-CM | POA: Diagnosis not present

## 2019-04-05 DIAGNOSIS — F310 Bipolar disorder, current episode hypomanic: Secondary | ICD-10-CM | POA: Diagnosis not present

## 2019-04-05 DIAGNOSIS — M797 Fibromyalgia: Secondary | ICD-10-CM | POA: Diagnosis not present

## 2019-04-05 DIAGNOSIS — Z7689 Persons encountering health services in other specified circumstances: Secondary | ICD-10-CM

## 2019-04-05 MED ORDER — DULOXETINE HCL 30 MG PO CPEP: ORAL_CAPSULE | ORAL | 3 refills | Status: DC

## 2019-04-05 MED ORDER — FARXIGA 10 MG PO TABS: 10.0000 mg | ORAL_TABLET | Freq: Every day | ORAL | 3 refills | Status: DC

## 2019-04-05 NOTE — Progress Notes (Signed)
Virtual Visit via Video Note Interactive audio and video telecommunications were attempted between myself and the patient, however failed, due to the patient having technical difficulties.. We continued and completed the visit with audio only.   I connected with Courtney Bennett on 04/05/19 at 11:00 AM EST by a video enabled telemedicine application and verified that I am speaking with the correct person using two identifiers. Location patient: home Location provider: work Persons participating in the virtual visit: patient, provider  I discussed the limitations of evaluation and management by telemedicine and the availability of in person appointments. The patient expressed understanding and agreed to proceed.  Chief Complaint  Patient presents with  . Establish Care    wants to switch from jardiance to [place]  . dental infection    needs abx    HPI: Courtney Bennett is a 53 y.o. female to establish care with our office.  Previous PCP Dr. Arelia Sneddon.  She has multiple concerns today and goes from one to another and back very often. Some are newer issues and others are chronic (2 years) Pt has DM x years. Pt states BS well controlled with A1C = 7.2 (12/2018). Pt states since GYN surgery in 01/2019, her blood sugars have been running very high. Fasting BS 200-400. She attributes this to post-op infection of surgical site, recent dx of colitis, and dental infection, which pt states her dentist is refusing to give her abx for. Pt currently on jardiance 25mg  daily but would like to switch back to farxiga, which she was on in the past and thinks it provided better BS control.   Pt interested in "hormone supplements" as she believes her partial hysterectomy has put her into menopause. She complains of insomnia, hot flashes, night sweats, mood swings. She has not discussed this with her GYN. Pt had hysterectomy (Lt ovary remains) in 01/2019 for an "large mass".  Pt has bipolar 1 disorder and follows at  [facility] with Alemu Mengistu, FNP x 6 years. She states she stopped her geodon 100mg  daily 2-3 days ago d/t mood swings, irritability, "mania", insomnia. She did not contact [facility] about this. She was on buspar in the past but stopped d/t side effects. Currently Rx'd hydroxyzine but states this makes her sleepy.  She has fibromyalgia and also complains of back, leg, and abdominal pain x 2 years. She did not tolerate lyrica d/t side effects/ has never been on cymbalta. Does not follow with rheum or pain management.    Past Medical History:  Diagnosis Date  . Bipolar 1 disorder ([place])   . Diabetes mellitus   . Fibromyalgia   . Hypertension     Past Surgical History:  Procedure Laterality Date  . CESAREAN SECTION    . HERNIA REPAIR      History reviewed. No pertinent family history.  Social History   Tobacco Use  . Smoking status: Former Smoker    Types: Cigarettes    Quit date: 11/21/2014    Years since quitting: 4.3  . Smokeless tobacco: Never Used  Substance Use Topics  . Alcohol use: Yes    Comment: rare  . Drug use: No     Current Outpatient Medications:  .  Accu-Chek Softclix Lancets lancets, CHECK BLOOD SUGAR ONCE A DAY. E11.42, Disp: , Rfl:  .  atorvastatin (LIPITOR) 20 MG tablet, Take 20 mg by mouth daily., Disp: , Rfl:  .  glucose blood (ACCU-CHEK AVIVA PLUS) test strip, 100 each by Misc.(Non-Drug; Combo Route) route 3 times  daily., Disp: , Rfl:  .  lisinopril (ZESTRIL) 40 MG tablet, Take 1 tablet by mouth daily., Disp: , Rfl:  .  dapagliflozin propanediol (FARXIGA) 10 MG TABS tablet, Take 10 mg by mouth daily before breakfast., Disp: 90 tablet, Rfl: 3 .  DULoxetine (CYMBALTA) 30 MG capsule, 1 cap po daily x 1 week and then increase 2 cap daily, Disp: 60 capsule, Rfl: 3  Allergies  Allergen Reactions  . Metformin Diarrhea  . Sulfur   . Lamictal [Lamotrigine] Rash  . Latex Itching and Rash      ROS: See pertinent positives and negatives per  HPI.   EXAM:  VITALS per patient if applicable: Ht 5\' 3"  (1.6 m)   BMI 31.94 kg/m    GENERAL: alert, oriented, speaks fast and does not stay on topic  LUNGS: no obvious SOB, gasping or wheezing, no conversational dyspnea  PSYCH/NEURO: cooperative, fast speech but not pressured, pt changes topic often and has to be redirected often   ASSESSMENT AND PLAN: 1. Encounter to establish care with new doctor  2. Controlled type 2 diabetes mellitus without complication, without long-term current use of insulin ([place]) - pt reports last A1C = 7.2 in 12/2018 but fasting BS 200-400 in the past month Rx: - dapagliflozin propanediol (FARXIGA) 10 MG TABS tablet; Take 10 mg by mouth daily before breakfast.  Dispense: 90 tablet; Refill: 3 - d/c jardiance - cont lisinopril, atorvastatin, daily 81mg  ASA - check BS BID - fasting and post-prandial - and keep log of bring glucometer to f/u appt in 2-3 mo - needs labs, urine, foot exam at appt in 2-3 mo  3. Fibromyalgia Rx: - DULoxetine (CYMBALTA) 30 MG capsule; 1 cap po daily x 1 week and then increase 2 cap daily  Dispense: 60 capsule; Refill: 3 - f/u in 3 mo or sooner PRN  4. Bipolar affective disorder, current episode hypomanic ([place]) - strongly recommended pt call her provider at [facility] to discuss her symptoms, concerns and to make him aware that she stopped her geodon   I discussed the assessment and treatment plan with the patient. The patient was provided an opportunity to ask questions and all were answered. The patient agreed with the plan and demonstrated an understanding of the instructions.   The patient was advised to call back or seek an in-person evaluation if the symptoms worsen or if the condition fails to improve as anticipated.   Letta Median, DO

## 2019-04-06 ENCOUNTER — Telehealth: Payer: Self-pay | Admitting: Family Medicine

## 2019-04-06 MED ORDER — DEXCOM G6 RECEIVER DEVI: 1.0000 | 2 refills | Status: DC

## 2019-04-06 MED ORDER — DEXCOM G6 SENSOR MISC: 1.0000 | 2 refills | Status: DC

## 2019-04-06 MED ORDER — DEXCOM G6 TRANSMITTER MISC: 1.0000 | 2 refills | Status: DC

## 2019-04-06 NOTE — Telephone Encounter (Signed)
Rx's sent in. aviva wearable device is not in epic, rx sent in for dexcom sensor, transmitter, and receiver.

## 2019-04-06 NOTE — Telephone Encounter (Signed)
Okay to send in 

## 2019-04-06 NOTE — Telephone Encounter (Signed)
Ok to send in Rx for aviva. Thank you

## 2019-04-06 NOTE — Telephone Encounter (Signed)
Pt is calling and she needs lancing device and would like to know if md could call in freestyle aviva wearable device or dexacom G6. [facility] [address] in [place]

## 2019-04-07 ENCOUNTER — Other Ambulatory Visit: Payer: Self-pay

## 2019-04-07 NOTE — Telephone Encounter (Signed)
Please try sending Rx for FreeStyle [place] - sensor and reader. Or pt can first check with her insurance to find out if this is covered. If it is not covered, pt should ask insurance what glucose monitoring system they do cover

## 2019-04-07 NOTE — Telephone Encounter (Signed)
Error

## 2019-04-07 NOTE — Telephone Encounter (Signed)
Please see message and advise.  Thank you. ° °

## 2019-04-07 NOTE — Telephone Encounter (Signed)
Patient called stating her insurance does not cover dexcom senor and is requesting another device due to she has no way to check her blood sugar Call back [phone]

## 2019-04-10 ENCOUNTER — Telehealth: Payer: Self-pay | Admitting: Family Medicine

## 2019-04-10 DIAGNOSIS — N951 Menopausal and female climacteric states: Secondary | ICD-10-CM

## 2019-04-10 NOTE — Telephone Encounter (Signed)
Late Entry- pt called on Saturday to after hours line indicating that her Dexcom was declined.  Asking for script for Freestyle meter.  Verbal order given for meter, strips, and lancets.

## 2019-04-10 NOTE — Telephone Encounter (Signed)
Pt stated she had a hysterectomy and is going through menopause. She would like to start taking hormone replacement pills.   Also pt stated she has not had a glucose meter for a week. Pt called on Saturday and Freestyle Aviva meter and supplies was sent to pharmacy however they need a diagnosis code to fill rx. Please advise.

## 2019-04-10 NOTE — Telephone Encounter (Signed)
Please advise 

## 2019-04-11 NOTE — Addendum Note (Signed)
Addended by: Darral Dash on: 04/11/2019 02:02 PM   Modules accepted: Orders

## 2019-04-11 NOTE — Telephone Encounter (Signed)
I'm not sure if this is the correct one.  Pt aware.

## 2019-04-12 NOTE — Telephone Encounter (Signed)
I called and spoke with pharmacy, dx code given. I called and spoke with pt. She is aware that pharmacy has the information they need, she is aware of the referral as well. She will let us know if she has any issues getting her testing supplies.

## 2019-04-12 NOTE — Telephone Encounter (Signed)
Pt states that her wearable meter still hasn't been called in to the pharmacy and she would like to speak with someone about this.

## 2019-04-12 NOTE — Telephone Encounter (Signed)
Will place referral to GYN for pt to discuss HRT It appears pt called after hours line on Sat and Rx was sent for freetyle meter, lancets, test strips. E11.9 is diagnosis code

## 2019-04-13 ENCOUNTER — Other Ambulatory Visit: Payer: Self-pay

## 2019-04-13 DIAGNOSIS — E119 Type 2 diabetes mellitus without complications: Secondary | ICD-10-CM

## 2019-04-13 MED ORDER — BLOOD GLUCOSE METER KIT
PACK | 0 refills | Status: DC
Start: 2019-04-13 — End: 2019-08-14

## 2019-04-13 MED ORDER — BLOOD GLUCOSE METER KIT: PACK | 0 refills | Status: DC

## 2019-04-13 NOTE — Telephone Encounter (Signed)
Patient is calling again to request a Freestyle Libre wearable meter.  She stated that this is the one that her insurance accepts.  Patient would like to know when she would be able to get it.  Please advise and call to discuss at [phone]

## 2019-04-13 NOTE — Telephone Encounter (Signed)
Spoke with patient she said her insurance would pay for [employer]. A rx was faxed over to pharmacy for freestyle [place] meter.

## 2019-04-13 NOTE — Telephone Encounter (Signed)
Thank you :)

## 2019-04-26 ENCOUNTER — Ambulatory Visit: Payer: Self-pay

## 2019-04-26 DIAGNOSIS — E119 Type 2 diabetes mellitus without complications: Secondary | ICD-10-CM

## 2019-04-26 MED ORDER — GLIPIZIDE 5 MG PO TABS: 5.0000 mg | ORAL_TABLET | Freq: Two times a day (BID) | ORAL | 0 refills | Status: DC

## 2019-04-26 NOTE — Telephone Encounter (Signed)
Spoke with pt. She will come Tuesday morning for labs at 9am. She has taken Glipizide in the past and it made her sugars drop too low. I advised her to continue current regimen, record her blood sugars, and we will follow up with her on Tuesday/Wednesday for a VV.

## 2019-04-26 NOTE — Telephone Encounter (Signed)
Pt. Reports her blood glucose has been elevated "since September. Dr C knows about it and put me back on Farxiga, but it's not helping. I'm afraid I could have an infection or something causing to be high." Taking medications as ordered. Blood sugar fasting this morning 445.  Gas symptoms, shakiness, frequent urination, feels hot, wobbly. Per [facility] in the practice, will send triage to be reviewed.  Reason for Disposition . Blood glucose > 400 mg/dL (22.2 mmol/L)  Answer Assessment - Initial Assessment Questions 1. BLOOD GLUCOSE: "What is your blood glucose level?"      400 2. ONSET: "When did you check the blood glucose?"     This morning before breakfast 3. USUAL RANGE: "What is your glucose level usually?" (e.g., usual fasting morning value, usual evening value)     Has been running high since September 4. KETONES: "Do you check for ketones (urine or blood test strips)?" If yes, ask: "What does the test show now?"      No 5. TYPE 1 or 2:  "Do you know what type of diabetes you have?"  (e.g., Type 1, Type 2, Gestational; doesn't know)      Type 2  6. INSULIN: "Do you take insulin?" "What type of insulin(s) do you use? What is the mode of delivery? (syringe, pen; injection or pump)?"      No 7. DIABETES PILLS: "Do you take any pills for your diabetes?" If yes, ask: "Have you missed taking any pills recently?"     Yes 8. OTHER SYMPTOMS: "Do you have any symptoms?" (e.g., fever, frequent urination, difficulty breathing, dizziness, weakness, vomiting)     Feels wobbly, feels hot, shaking, frequent urination 9. PREGNANCY: "Is there any chance you are pregnant?" "When was your last menstrual period?"     No  Protocols used: DIABETES - HIGH BLOOD SUGAR-A-AH

## 2019-04-26 NOTE — Telephone Encounter (Signed)
I have seen pt once via VV on 11/42020. Pt will need labs - A1C, CMP, CBC, urine microalbumin creatinine ration, lipid panel. She may need more than 1 oral med to control her BS. Please have her come in Monday for lab appt and then can do VV for her DM with me on Wed or Thurs. She should be checking BS at least 2x/day - fasting and 2hrs after lunch or dinner. Keep log of BS readings for appt with me. Pt experienced diarrhea with metformin so will try glipizide 5mg  BID to current regimen in the meantime.

## 2019-04-26 NOTE — Telephone Encounter (Signed)
lmovm x 1, triage nurse may discuss with pt. She needs lab appt for Monday.

## 2019-05-02 ENCOUNTER — Other Ambulatory Visit (INDEPENDENT_AMBULATORY_CARE_PROVIDER_SITE_OTHER): Payer: Medicare Other

## 2019-05-02 ENCOUNTER — Other Ambulatory Visit: Payer: Self-pay

## 2019-05-02 DIAGNOSIS — E119 Type 2 diabetes mellitus without complications: Secondary | ICD-10-CM

## 2019-05-03 LAB — COMPREHENSIVE METABOLIC PANEL
ALT: 15 IU/L (ref 0–32)
AST: 14 IU/L (ref 0–40)
Albumin/Globulin Ratio: 1.8 (ref 1.2–2.2)
Albumin: 4.5 g/dL (ref 3.8–4.9)
Alkaline Phosphatase: 87 IU/L (ref 39–117)
BUN/Creatinine Ratio: 21 (ref 9–23)
BUN: 13 mg/dL (ref 6–24)
Bilirubin Total: 0.3 mg/dL (ref 0.0–1.2)
CO2: 24 mmol/L (ref 20–29)
Calcium: 9.7 mg/dL (ref 8.7–10.2)
Chloride: 95 mmol/L — ABNORMAL LOW (ref 96–106)
Creatinine, Ser: 0.63 mg/dL (ref 0.57–1.00)
GFR calc Af Amer: 118 mL/min/{1.73_m2} (ref >59)
GFR calc non Af Amer: 103 mL/min/{1.73_m2} (ref >59)
Globulin, Total: 2.5 g/dL (ref 1.5–4.5)
Glucose: 364 mg/dL — ABNORMAL HIGH (ref 65–99)
Potassium: 4.2 mmol/L (ref 3.5–5.2)
Sodium: 135 mmol/L (ref 134–144)
Total Protein: 7 g/dL (ref 6.0–8.5)

## 2019-05-03 LAB — LIPID PANEL
Chol/HDL Ratio: 3.1 ratio (ref 0.0–4.4)
Cholesterol, Total: 212 mg/dL — ABNORMAL HIGH (ref 100–199)
HDL: 68 mg/dL (ref >39)
LDL Chol Calc (NIH): 121 mg/dL — ABNORMAL HIGH (ref 0–99)
Triglycerides: 132 mg/dL (ref 0–149)
VLDL Cholesterol Cal: 23 mg/dL (ref 5–40)

## 2019-05-03 LAB — CBC
Hematocrit: 49.2 % — ABNORMAL HIGH (ref 34.0–46.6)
Hemoglobin: 16.3 g/dL — ABNORMAL HIGH (ref 11.1–15.9)
MCH: 29.7 pg (ref 26.6–33.0)
MCHC: 33.1 g/dL (ref 31.5–35.7)
MCV: 90 fL (ref 79–97)
Platelets: 264 10*3/uL (ref 150–450)
RBC: 5.48 x10E6/uL — ABNORMAL HIGH (ref 3.77–5.28)
RDW: 13.2 % (ref 11.7–15.4)
WBC: 9 10*3/uL (ref 3.4–10.8)

## 2019-05-03 LAB — MICROALBUMIN / CREATININE URINE RATIO
Creatinine, Urine: 50.8 mg/dL
Microalb/Creat Ratio: 21 mg/g creat (ref 0–29)
Microalbumin, Urine: 10.8 ug/mL

## 2019-05-03 LAB — HEMOGLOBIN A1C
Est. average glucose Bld gHb Est-mCnc: 255 mg/dL
Hgb A1c MFr Bld: 10.5 % — ABNORMAL HIGH (ref 4.8–5.6)

## 2019-05-05 ENCOUNTER — Ambulatory Visit (INDEPENDENT_AMBULATORY_CARE_PROVIDER_SITE_OTHER): Payer: Medicare Other | Admitting: Family Medicine

## 2019-05-05 ENCOUNTER — Other Ambulatory Visit: Payer: Self-pay

## 2019-05-05 ENCOUNTER — Encounter: Payer: Self-pay | Admitting: Family Medicine

## 2019-05-05 VITALS — Ht 63.0 in | Wt 122.0 lb

## 2019-05-05 DIAGNOSIS — E1165 Type 2 diabetes mellitus with hyperglycemia: Secondary | ICD-10-CM | POA: Diagnosis not present

## 2019-05-05 MED ORDER — SERTRALINE HCL 100 MG PO TABS: 150.0000 mg | ORAL_TABLET | Freq: Every day | ORAL | 3 refills | Status: DC

## 2019-05-05 MED ORDER — GLIPIZIDE 5 MG PO TABS: 5.0000 mg | ORAL_TABLET | Freq: Two times a day (BID) | ORAL | 0 refills | Status: DC

## 2019-05-05 NOTE — Progress Notes (Signed)
Virtual Visit via Video Note  I connected with Courtney Bennett on 05/05/19 at 11:30 AM EST by a video enabled telemedicine application and verified that I am speaking with the correct person using two identifiers. Location patient: home Location provider: work  Persons participating in the virtual visit: patient, provider  I discussed the limitations of evaluation and management by telemedicine and the availability of in person appointments. The patient expressed understanding and agreed to proceed.  No chief complaint on file.   HPI: Courtney Bennett is a 53 y.o. female for DM f/u. Her most recent A1C (and first done with our office) was significantly elevated at 10.5. She is taking farxiga 69m daily, lisinopril 468mdaily, lipitor 201maily. Pt states she cannot tolerate metformin d/t GI side effects. Home BS readings have been elevated since hysterectomy. Lowest fasting BS = 270, highest > 400.  Lab Results  Component Value Date   HGBA1C 10.5 (H) 05/02/2019  Pt states A1C prior to hysterectomy in 12/2018 = 7.2   She started glipizide 5mg72mily a few days ago.  She was on amaryl in the past, ? januiva as well.   [facility] pt has appt with Dr. DalvMyrtis SerN) this afternoon to discus hormonal replacement and f/u on pain s/p hysterectomy. [facility]  Pt requests her med list be updated to include the zoloft 150mg78mly she has been taking for "a few years"  Past Medical History:  Diagnosis Date  . Bipolar 1 disorder (HCC) [place] Diabetes mellitus   . Fibromyalgia   . Hypertension     Past Surgical History:  Procedure Laterality Date  . CESAREAN SECTION    . HERNIA REPAIR      No family history on file.  Social History   Tobacco Use  . Smoking status: Former Smoker    Types: Cigarettes    Quit date: 11/21/2014    Years since quitting: 4.4  . Smokeless tobacco: Never Used  Substance Use Topics  . Alcohol use:  Yes    Comment: rare  . Drug use: No     Current Outpatient Medications:  .  Accu-Chek Softclix Lancets lancets, CHECK BLOOD SUGAR ONCE A DAY. E11.42, Disp: , Rfl:  .  atorvastatin (LIPITOR) 20 MG tablet, Take 20 mg by mouth daily., Disp: , Rfl:  .  blood glucose meter kit and supplies, Dispense based on patient and insurance preference. Use up to four times daily as directed. (FOR ICD-10 E10.9, E11.9)., Disp: 1 each, Rfl: 0 .  dapagliflozin propanediol (FARXIGA) 10 MG TABS tablet, Take 10 mg by mouth daily before breakfast., Disp: 90 tablet, Rfl: 3 .  DULoxetine (CYMBALTA) 30 MG capsule, 1 cap po daily x 1 week and then increase 2 cap daily, Disp: 60 capsule, Rfl: 3 .  glucose blood (ACCU-CHEK AVIVA PLUS) test strip, 100 each by Misc.(Non-Drug; Combo Route) route 3 times daily., Disp: , Rfl:  .  lisinopril (ZESTRIL) 40 MG tablet, Take 1 tablet by mouth daily., Disp: , Rfl:   Allergies  Allergen Reactions  . Metformin Diarrhea  . Sulfur   . Lamictal [Lamotrigine] Rash  . Latex Itching and Rash      ROS: See pertinent positives and negatives per HPI.   EXAM:  VITALS per patient if applicable: Ht <Remo<FGHWEXHBZJIRCVEL>_3<\/YBOFBPZWCHENIDPO>_26 m)   Wt 122 lb (55.3 kg)   BMI 21.61 kg/m    GENERAL: alert, oriented,  appears well and in no acute distress  HEENT: atraumatic, conjunctiva clear, no obvious abnormalities on inspection of external nose and ears  NECK: normal movements of the head and neck  LUNGS: on inspection no signs of respiratory distress, breathing rate appears normal, no obvious gross SOB, gasping or wheezing, no conversational dyspnea  CV: no obvious cyanosis  MS: moves all visible extremities without noticeable abnormality  PSYCH/NEURO: pleasant and cooperative, no obvious depression or anxiety, speech and thought processing grossly intact   ASSESSMENT AND PLAN:  1. Uncontrolled type 2 diabetes mellitus with hyperglycemia (HCC) - increase glipizide to 28m BID x 1-2 wks then to 134m BID - cont farxiga 1049maily - cont to check BS and keep log - cont ACEi, statin, ASA 27m19mily - f/u in 6-8 wks or sooner PRN   I discussed the assessment and treatment plan with the patient. The patient was provided an opportunity to ask questions and all were answered. The patient agreed with the plan and demonstrated an understanding of the instructions.   The patient was advised to call back or seek an in-person evaluation if the symptoms worsen or if the condition fails to improve as anticipated.   MaryLetta Median

## 2019-05-09 ENCOUNTER — Other Ambulatory Visit: Payer: Self-pay | Admitting: Family Medicine

## 2019-05-11 ENCOUNTER — Other Ambulatory Visit: Payer: Self-pay | Admitting: Family Medicine

## 2019-05-11 MED ORDER — FREESTYLE LIBRE 14 DAY SENSOR MISC: 1.0000 "application " | 11 refills | Status: DC

## 2019-05-12 ENCOUNTER — Other Ambulatory Visit: Payer: Self-pay | Admitting: Family Medicine

## 2019-05-12 DIAGNOSIS — M797 Fibromyalgia: Secondary | ICD-10-CM

## 2019-05-29 ENCOUNTER — Other Ambulatory Visit: Payer: Self-pay

## 2019-05-29 ENCOUNTER — Ambulatory Visit: Payer: Medicare Other

## 2019-05-30 ENCOUNTER — Ambulatory Visit: Payer: Medicare Other | Admitting: Family Medicine

## 2019-05-31 ENCOUNTER — Ambulatory Visit
Admission: RE | Admit: 2019-05-31 | Discharge: 2019-05-31 | Disposition: A | Discharge status: Home / Self Care | Payer: Medicare Other | Source: Ambulatory Visit | Attending: Nurse Practitioner | Admitting: Nurse Practitioner

## 2019-05-31 ENCOUNTER — Other Ambulatory Visit: Payer: Self-pay

## 2019-05-31 DIAGNOSIS — Z1231 Encounter for screening mammogram for malignant neoplasm of breast: Secondary | ICD-10-CM

## 2019-06-07 ENCOUNTER — Other Ambulatory Visit: Payer: Self-pay

## 2019-06-08 ENCOUNTER — Other Ambulatory Visit (HOSPITAL_COMMUNITY)
Admission: RE | Admit: 2019-06-08 | Discharge: 2019-06-08 | Disposition: A | Discharge status: Home / Self Care | Payer: Medicare Other | Source: Ambulatory Visit | Attending: Family Medicine | Admitting: Family Medicine

## 2019-06-08 ENCOUNTER — Ambulatory Visit (INDEPENDENT_AMBULATORY_CARE_PROVIDER_SITE_OTHER): Payer: Medicare Other

## 2019-06-08 ENCOUNTER — Ambulatory Visit: Payer: Medicare Other | Admitting: Family Medicine

## 2019-06-08 ENCOUNTER — Ambulatory Visit (INDEPENDENT_AMBULATORY_CARE_PROVIDER_SITE_OTHER): Payer: Medicare Other | Admitting: Family Medicine

## 2019-06-08 VITALS — BP 140/82 | HR 116 | Temp 96.4°F | Ht 63.0 in | Wt 123.8 lb

## 2019-06-08 DIAGNOSIS — M549 Dorsalgia, unspecified: Secondary | ICD-10-CM

## 2019-06-08 DIAGNOSIS — F310 Bipolar disorder, current episode hypomanic: Secondary | ICD-10-CM | POA: Diagnosis not present

## 2019-06-08 DIAGNOSIS — E1165 Type 2 diabetes mellitus with hyperglycemia: Secondary | ICD-10-CM | POA: Diagnosis not present

## 2019-06-08 DIAGNOSIS — G8929 Other chronic pain: Secondary | ICD-10-CM

## 2019-06-08 DIAGNOSIS — N898 Other specified noninflammatory disorders of vagina: Secondary | ICD-10-CM

## 2019-06-08 DIAGNOSIS — R112 Nausea with vomiting, unspecified: Secondary | ICD-10-CM | POA: Diagnosis not present

## 2019-06-08 DIAGNOSIS — R300 Dysuria: Secondary | ICD-10-CM

## 2019-06-08 DIAGNOSIS — M797 Fibromyalgia: Secondary | ICD-10-CM | POA: Diagnosis not present

## 2019-06-08 DIAGNOSIS — R109 Unspecified abdominal pain: Secondary | ICD-10-CM

## 2019-06-08 DIAGNOSIS — F411 Generalized anxiety disorder: Secondary | ICD-10-CM | POA: Diagnosis not present

## 2019-06-08 DIAGNOSIS — R159 Full incontinence of feces: Secondary | ICD-10-CM | POA: Diagnosis not present

## 2019-06-08 DIAGNOSIS — M546 Pain in thoracic spine: Secondary | ICD-10-CM | POA: Diagnosis not present

## 2019-06-08 DIAGNOSIS — L989 Disorder of the skin and subcutaneous tissue, unspecified: Secondary | ICD-10-CM | POA: Diagnosis not present

## 2019-06-08 DIAGNOSIS — M545 Low back pain: Secondary | ICD-10-CM | POA: Diagnosis not present

## 2019-06-08 MED ORDER — VENLAFAXINE HCL ER 75 MG PO CP24: 75.0000 mg | ORAL_CAPSULE | Freq: Every day | ORAL | 3 refills | Status: DC

## 2019-06-08 NOTE — Progress Notes (Signed)
Courtney Bennett is a 54 y.o. female  Chief Complaint  Patient presents with  . Follow-up    blood sugar follow up. pt wants to discuss several medical issues. shakes,lesions, headaches, weak legs,rashes.Marland Kitchen    HPI: Courtney Bennett is a 54 y.o. female here with multiple concerns: 1. DM f/u - BS are running high on farxiga 19m daily and glipizide 173mBID. She estimates her average FBS = 300. She feels weak, shaky. Prior to GYN surgery in 01/2019, her BS were well-controlled with A1C = 7.2 in 12/2018.  2. GI symptoms - abdominal pain and fullness that is generalized. Persistent nausea w/ vomiting at times. No hematemesis. She states she eats very little due to symptoms. In the past few weeks, she notes 3 episodes of fecal incontinence. Denies issues with constipation and states she has at least 1, and often more, soft or loose BM per day. No blood in stool.  3. She feels like she needs something for menopausal symptoms - hot flashes, mood swings, irritability. She saw GYN to discuss HRT but states this was recommended against d/t fam h/o breast cancer. Pt was Rx'd effexor 37.16m23mhich she has been taking, but does not feel it is very effective.  4. Pt complains of a few weeks of vaginal burning, irritation, odor. No discharge. She also endorses dysuria. Denies gross hematuria, fever, chills, new back pain. She is engaged but wonders if she has an STD.  5. Chronic back and neck pain since a fall 2 years ago. She states she fell on stairs and had significant pain and bruising. Saw previous PCP Dr. ElkArelia Sneddont states no xrays or testing were done. She has dx of fibromyalgia and is taking cymbalta 101m616mily. Pt states she in is daily pain.  6. Bump/rash on head x months. Sore at times.  7. Anxiety - h/o bipolar, previous on goedon ("too high of a dose" as per pt), currently on zoloft 150mg77mly, effexor 37.16mg d79my, cymbalta 101mg d24m. She does not see psych.   Past Medical History:  Diagnosis Date    . Bipolar 1 disorder (HCC)   [place]abetes mellitus   . Fibromyalgia   . Hypertension     Past Surgical History:  Procedure Laterality Date  . CESAREAN SECTION    . HERNIA REPAIR      Social History   Socioeconomic History  . Marital status: Single    Spouse name: Not on file  . Number of children: Not on file  . Years of education: Not on file  . Highest education level: Not on file  Occupational History  . Not on file  Tobacco Use  . Smoking status: Former Smoker    Types: Cigarettes    Quit date: 11/21/2014    Years since quitting: 4.5  . Smokeless tobacco: Never Used  Substance and Sexual Activity  . Alcohol use: Yes    Comment: rare  . Drug use: No  . Sexual activity: Not on file  Other Topics Concern  . Not on file  Social History Narrative  . Not on file   Social Determinants of Health   Financial Resource Strain:   . Difficulty of Paying Living Expenses: Not on file  Food Insecurity:   . Worried About RunningCharity fundraiser Last Year: Not on file  . Ran Out of Food in the Last Year: Not on file  Transportation Needs:   . Lack of Transportation (Medical): Not on file  .  Lack of Transportation (Non-Medical): Not on file  Physical Activity:   . Days of Exercise per Week: Not on file  . Minutes of Exercise per Session: Not on file  Stress:   . Feeling of Stress : Not on file  Social Connections:   . Frequency of Communication with Friends and Family: Not on file  . Frequency of Social Gatherings with Friends and Family: Not on file  . Attends Religious Services: Not on file  . Active Member of Clubs or Organizations: Not on file  . Attends Archivist Meetings: Not on file  . Marital Status: Not on file  Intimate Partner Violence:   . Fear of Current or Ex-Partner: Not on file  . Emotionally Abused: Not on file  . Physically Abused: Not on file  . Sexually Abused: Not on file    No family history on file.    There is no immunization  history on file for this patient.  Outpatient Encounter Medications as of 06/08/2019  Medication Sig  . Accu-Chek Softclix Lancets lancets CHECK BLOOD SUGAR ONCE A DAY. E11.42  . atorvastatin (LIPITOR) 20 MG tablet Take 20 mg by mouth daily.  . blood glucose meter kit and supplies Dispense based on patient and insurance preference. Use up to four times daily as directed. (FOR ICD-10 E10.9, E11.9).  Marland Kitchen Continuous Blood Gluc Sensor (FREESTYLE LIBRE 14 DAY SENSOR) MISC 1 application by Subdermal route See admin instructions. Check blood glucose 1-2 times daily. E11.65  . dapagliflozin propanediol (FARXIGA) 10 MG TABS tablet Take 10 mg by mouth daily before breakfast.  . DULoxetine (CYMBALTA) 60 MG capsule Take 1 capsule (60 mg total) by mouth daily.  Marland Kitchen glipiZIDE (GLUCOTROL) 5 MG tablet Take 1 tablet (5 mg total) by mouth 2 (two) times daily before a meal.  . glucose blood (ACCU-CHEK AVIVA PLUS) test strip 100 each by Misc.(Non-Drug; Combo Route) route 3 times daily.  Marland Kitchen lisinopril (ZESTRIL) 40 MG tablet Take 1 tablet by mouth daily.  . sertraline (ZOLOFT) 100 MG tablet Take 1.5 tablets (150 mg total) by mouth daily.  Marland Kitchen venlafaxine XR (EFFEXOR-XR) 37.5 MG 24 hr capsule Take by mouth.   No facility-administered encounter medications on file as of 06/08/2019.     ROS: Pertinent positives and negatives noted in HPI. Remainder of ROS non-contributory    Allergies  Allergen Reactions  . Metformin Diarrhea  . Sulfur   . Lamictal [Lamotrigine] Rash  . Latex Itching and Rash    BP 140/82   Pulse (!) 116   Temp (!) 96.4 F (35.8 C) (Temporal)   Ht 5' 3"  (1.6 m)   Wt 123 lb 12.8 oz (56.2 kg)   SpO2 97%   BMI 21.93 kg/m   BP Readings from Last 3 Encounters:  06/08/19 140/82  11/26/14 131/63  08/11/14 101/66   Wt Readings from Last 3 Encounters:  06/08/19 123 lb 12.8 oz (56.2 kg)  05/05/19 122 lb (55.3 kg)  11/24/14 180 lb 4.8 oz (81.8 kg)   Pulse Readings from Last 3 Encounters:    06/08/19 (!) 116  11/26/14 93  08/11/14 94     Physical Exam  Constitutional: She is oriented to person, place, and time. No distress.  Pulmonary/Chest: Effort normal. No respiratory distress.  Abdominal: Soft. Bowel sounds are normal. She exhibits no distension and no mass. There is abdominal tenderness (generalized TTP). There is no rebound and no guarding.  Musculoskeletal:        General: Normal range  of motion.  Neurological: She is alert and oriented to person, place, and time.  Skin: She is not diaphoretic.  Psychiatric: Her behavior is normal. Thought content normal. Her mood appears anxious. Her speech is tangential.    Lab Results  Component Value Date   HGBA1C 10.5 (H) 05/02/2019   Component     Latest Ref Rng & Units 06/08/2019  Color, Urine     YELLOW YELLOW  Appearance     CLEAR CLEAR  Specific Gravity, Urine     1.001 - 1.03 1.020  pH     5.0 - 8.0 < OR = 5.0  Glucose, UA     NEGATIVE 3+ (A)  Bilirubin Urine     NEGATIVE NEGATIVE  Ketones, ur     NEGATIVE 1+ (A)  Hgb urine dipstick     NEGATIVE NEGATIVE  Protein     NEGATIVE NEGATIVE  Nitrite     NEGATIVE NEGATIVE  Leukocytes,Ua     NEGATIVE NEGATIVE   A/P:  1. Uncontrolled type 2 diabetes mellitus with hyperglycemia (HCC) - A1C in 12/20 = 10.5, up from 7.2 in 8/20 - pt unable to tolerate metformin, actos, ? januvia in the past - cont farxiga 54m daily, glipizide 132mBID Rx: - liraglutide (VICTOZA) 18 MG/3ML SOPN; 0.30m101mQ daily x 1 week then 1.2mg30m daily  Dispense: 3 mL; Refill: 1 - Ambulatory referral to Endocrinology  2. Incontinence of feces, unspecified fecal incontinence type 3. Chronic abdominal pain 4. Non-intractable vomiting with nausea, unspecified vomiting type - Ambulatory referral to Gastroenterology - some symptoms could be d/t gastroparesis so will try reglan x 14 days to see if any improvement Rx: - metoCLOPramide (REGLAN) 10 MG tablet; Take 1 tablet (10 mg total) by  mouth 4 (four) times daily -  before meals and at bedtime.  Dispense: 56 tablet; Refill: 0 - fecal incontinence could be d/t overflow diarrhea from underlying constipation, although pt states she had soft or loose BM at least 1x/day and usually more often. No recent trauma or injury, no new/worse back pain  5. Chronic back pain greater than 3 months duration 6. Fibromyalgia - DG Cervical Spine Complete - DG Lumbar Spine Complete - DG Thoracic Spine 2 View - difficult to differentiate fibromyalgia symptoms from potential other causes of back an neck pain. Pt did have a fall 2 years ago so will obtain xrays d/t trauma and chronicity of symptoms - on cymblata 60mg83mly - consider switch to lyrica (pregabalin)  7. Dysuria 8. Vaginal odor - Urine cytology ancillary only([facility]) - Urinalysis - increase water intake  9. Scalp lesion - Ambulatory referral to Dermatology  10. Generalized anxiety disorder - increase effexor to 75mg 530my - cont zoloft - referral to psychiatry  I personally spent 50 min with the patient today   This visit occurred during the SARS-CoV-2 public health emergency.  Safety protocols were in place, including screening questions prior to the visit, additional usage of staff PPE, and extensive cleaning of exam room while observing appropriate contact time as indicated for disinfecting solutions.

## 2019-06-09 ENCOUNTER — Encounter: Payer: Self-pay | Admitting: Family Medicine

## 2019-06-09 LAB — URINALYSIS
Bilirubin Urine: NEGATIVE
Hgb urine dipstick: NEGATIVE
Leukocytes,Ua: NEGATIVE
Nitrite: NEGATIVE
Protein, ur: NEGATIVE
Specific Gravity, Urine: 1.02 (ref 1.001–1.03)
pH: <=5 (ref 5.0–8.0)

## 2019-06-09 MED ORDER — LIRAGLUTIDE 18 MG/3ML ~~LOC~~ SOPN: PEN_INJECTOR | SUBCUTANEOUS | 1 refills | Status: DC

## 2019-06-09 MED ORDER — METOCLOPRAMIDE HCL 10 MG PO TABS: 10.0000 mg | ORAL_TABLET | Freq: Three times a day (TID) | ORAL | 0 refills | Status: DC

## 2019-06-12 ENCOUNTER — Telehealth: Payer: Self-pay | Admitting: *Deleted

## 2019-06-12 ENCOUNTER — Telehealth: Payer: Self-pay | Admitting: Family Medicine

## 2019-06-12 NOTE — Telephone Encounter (Signed)
Patient is calling and wanted to speak to someone regarding help going over lab results. CB is [phone].

## 2019-06-12 NOTE — Telephone Encounter (Signed)
Pt called wanting to go over her urinalysis. Unable to find a note to give pt lab results.  Attempted to call the office to speak with someone, no answer. She would like a call back today regarding her lab results and xray results,  and if she should change her appointment to an earlier appointment before Friday.

## 2019-06-12 NOTE — Telephone Encounter (Signed)
I spoke with patient and informed her that her lab was not resulted as of yet.  I also informed pt that Dr. Loletha Grayer, would be out of office until Thursday.  I explained to pt that once labs are resulted we will call her.

## 2019-06-12 NOTE — Telephone Encounter (Signed)
I informed patient results wasn't in yet and someone will call her with results once we receive them. Patient is aware pcp won't be in the office until Thursday. Patient verbalized understanding.

## 2019-06-13 ENCOUNTER — Encounter: Payer: Self-pay | Admitting: Gastroenterology

## 2019-06-14 NOTE — Telephone Encounter (Signed)
Spoke with pt about result. Pt understood and states she will keep her appointment with Dr. Loletha Grayer for this Friday.

## 2019-06-14 NOTE — Telephone Encounter (Signed)
Urine showed glucose and protein, due to uncontrolled DM. Brendell spoke with pt on Friday re: xray results - mild arthritis in spine. She can take tylenol arthritis daily. Heating pad, regular exercise can also help.

## 2019-06-15 ENCOUNTER — Telehealth: Payer: Self-pay | Admitting: Family Medicine

## 2019-06-15 NOTE — Telephone Encounter (Signed)
Patient calling about labs results and wanted to and find out if some other test were done as well. Please return call.

## 2019-06-15 NOTE — Telephone Encounter (Signed)
Urine cytology was ordered at last OV but does not appear to have been resulted. It may not have been sent? We can repeat it at appt tomorrow and can also do blood test for HIV if pt would like HIV screening

## 2019-06-15 NOTE — Telephone Encounter (Signed)
Ok thank you for checking.can discuss with pt at appt tomorrow

## 2019-06-15 NOTE — Telephone Encounter (Signed)
Dr. Gardiner Rhyme confirmed that it was sent. Not sure of the turn around time as part of it had to be sent off.

## 2019-06-15 NOTE — Telephone Encounter (Signed)
Dr. Loletha Grayer  Pt called office asking about previous labs. When I spoke with pt she was checking on her urine cytology. Per epic this is still pending and I informed pt. She asked when she had blood work in Dec, if you checked her thyroid and a HIV test. I checked and I did not see this ordered. Per pt she is keeping her appointment for tomorrow but she would like both labs to be checked.

## 2019-06-16 ENCOUNTER — Encounter: Payer: Self-pay | Admitting: Family Medicine

## 2019-06-16 ENCOUNTER — Other Ambulatory Visit: Payer: Self-pay

## 2019-06-16 ENCOUNTER — Ambulatory Visit (INDEPENDENT_AMBULATORY_CARE_PROVIDER_SITE_OTHER): Payer: Medicare Other | Admitting: Family Medicine

## 2019-06-16 VITALS — BP 138/80 | HR 110 | Temp 97.3°F | Ht 63.0 in | Wt 122.4 lb

## 2019-06-16 DIAGNOSIS — Z113 Encounter for screening for infections with a predominantly sexual mode of transmission: Secondary | ICD-10-CM | POA: Diagnosis not present

## 2019-06-16 DIAGNOSIS — E1165 Type 2 diabetes mellitus with hyperglycemia: Secondary | ICD-10-CM

## 2019-06-16 DIAGNOSIS — N951 Menopausal and female climacteric states: Secondary | ICD-10-CM | POA: Diagnosis not present

## 2019-06-16 DIAGNOSIS — Z202 Contact with and (suspected) exposure to infections with a predominantly sexual mode of transmission: Secondary | ICD-10-CM

## 2019-06-16 DIAGNOSIS — F419 Anxiety disorder, unspecified: Secondary | ICD-10-CM | POA: Diagnosis not present

## 2019-06-16 MED ORDER — ESTROGENS CONJUGATED 0.3 MG PO TABS: 0.3000 mg | ORAL_TABLET | Freq: Every day | ORAL | 2 refills | Status: DC

## 2019-06-16 MED ORDER — KETOCONAZOLE 2 % EX SHAM: 1.0000 "application " | MEDICATED_SHAMPOO | CUTANEOUS | 0 refills | Status: DC

## 2019-06-16 NOTE — Progress Notes (Signed)
Courtney Bennett is a 54 y.o. female  Chief Complaint  Patient presents with  . Follow-up    HPI: Courtney Bennett is a 54 y.o. female here for f/u on multiple chronic medical issues. She has GI appt scheduled for 06/29/19 - Dr. Luanna Salk Courtney Bennett [facility]. She has noted some improvement with reglan.   Pt has psych appt on 06/28/19 Beverly Sessions  Derm referral to [facility] medical placed on 06/09/19. Pt has been seen at [facility] in the past so will return there for scalp lesions. Will try ketoconazole shampoo 2x/wk until seen by derm.   Pt is still very concerned about hot flashes, irritability, vaginal dryness and would very much like to try oral estrogen. She saw GYN who did not feel this was recommended d/t family h/o breast cancer. Pt is on effexor 16m daily but does not feel this has been effective/helpful. She understands the risks of HRT and would like to proceed w 3 mo trial.  DM uncontrolled - Victoza not covered by insurance, as per pt. Pt is on glipizide 110mBID, farxiga 1054maily. She is not able to tolerate metformin, januvia. She was contacted by endocrinology to schedule but was driving to this appt so will call back to schedule.   Pt requests full STI screening as she has concerns fiance has been unfaithful and may have been exposed to STI. She states he was incarcerated as well so she would like full screening. Urine sample was taken at OV 1 week ago. Lab was called earlier today and gonorrhea, chlamydia, trichomonas all negative.   Pt would like thyroid function checked and wonders if hair thinning and increased anxiety d/t thyroid dysfunction.  Past Medical History:  Diagnosis Date  . B[place] 1 disorder ([place] Diabetes mellitus   . Fibromyalgia   . Hypertension     Past Surgical History:  Procedure Laterality Date  . ABDOMINAL HYSTERECTOMY  01/2019  . CESAREAN SECTION    . HERNIA REPAIR      Social History   Socioeconomic History  . Marital status:  Single    Spouse name: Not on file  . Number of children: Not on file  . Years of education: Not on file  . Highest education level: Not on file  Occupational History  . Not on file  Tobacco Use  . Smoking status: Former Smoker    Types: Cigarettes    Quit date: 11/21/2014    Years since quitting: 4.5  . Smokeless tobacco: Never Used  Substance and Sexual Activity  . Alcohol use: Yes    Comment: rare  . Drug use: No  . Sexual activity: Not on file  Other Topics Concern  . Not on file  Social History Narrative  . Not on file   Social Determinants of Health   Financial Resource Strain:   . Difficulty of Paying Living Expenses: Not on file  Food Insecurity:   . Worried About RunCharity fundraiser the Last Year: Not on file  . Ran Out of Food in the Last Year: Not on file  Transportation Needs:   . Lack of Transportation (Medical): Not on file  . Lack of Transportation (Non-Medical): Not on file  Physical Activity:   . Days of Exercise per Week: Not on file  . Minutes of Exercise per Session: Not on file  Stress:   . Feeling of Stress : Not on file  Social Connections:   . Frequency of Communication  with Friends and Family: Not on file  . Frequency of Social Gatherings with Friends and Family: Not on file  . Attends Religious Services: Not on file  . Active Member of Clubs or Organizations: Not on file  . Attends Archivist Meetings: Not on file  . Marital Status: Not on file  Intimate Partner Violence:   . Fear of Current or Ex-Partner: Not on file  . Emotionally Abused: Not on file  . Physically Abused: Not on file  . Sexually Abused: Not on file    History reviewed. No pertinent family history.    There is no immunization history on file for this patient.  Outpatient Encounter Medications as of 06/16/2019  Medication Sig  . Accu-Chek Softclix Lancets lancets CHECK BLOOD SUGAR ONCE A DAY. E11.42  . atorvastatin (LIPITOR) 20 MG tablet Take 20 mg by  mouth daily.  . blood glucose meter kit and supplies Dispense based on patient and insurance preference. Use up to four times daily as directed. (FOR ICD-10 E10.9, E11.9).  Marland Kitchen Continuous Blood Gluc Sensor (FREESTYLE LIBRE 14 DAY SENSOR) MISC 1 application by Subdermal route See admin instructions. Check blood glucose 1-2 times daily. E11.65  . dapagliflozin propanediol (FARXIGA) 10 MG TABS tablet Take 10 mg by mouth daily before breakfast.  . DULoxetine (CYMBALTA) 60 MG capsule Take 1 capsule (60 mg total) by mouth daily.  Marland Kitchen glipiZIDE (GLUCOTROL) 5 MG tablet Take 1 tablet (5 mg total) by mouth 2 (two) times daily before a meal.  . glucose blood (ACCU-CHEK AVIVA PLUS) test strip 100 each by Misc.(Non-Drug; Combo Route) route 3 times daily.  Marland Kitchen lisinopril (ZESTRIL) 40 MG tablet Take 1 tablet by mouth daily.  . metoCLOPramide (REGLAN) 10 MG tablet Take 1 tablet (10 mg total) by mouth 4 (four) times daily -  before meals and at bedtime.  . sertraline (ZOLOFT) 100 MG tablet Take 1.5 tablets (150 mg total) by mouth daily.  Marland Kitchen venlafaxine XR (EFFEXOR-XR) 75 MG 24 hr capsule Take 1 capsule (75 mg total) by mouth daily with breakfast.  . estrogens, conjugated, (PREMARIN) 0.3 MG tablet Take 1 tablet (0.3 mg total) by mouth daily.  Derrill Memo ON 06/19/2019] ketoconazole (NIZORAL) 2 % shampoo Apply 1 application topically 2 (two) times a week.  . liraglutide (VICTOZA) 18 MG/3ML SOPN 0.40m SQ daily x 1 week then 1.252mSQ daily (Patient not taking: Reported on 06/16/2019)   No facility-administered encounter medications on file as of 06/16/2019.     ROS: Pertinent positives and negatives noted in HPI. Remainder of ROS non-contributory    Allergies  Allergen Reactions  . Metformin Diarrhea  . Sulfur   . Lamictal [Lamotrigine] Rash  . Latex Itching and Rash    BP 138/80 (BP Location: Right Arm, Patient Position: Sitting, Cuff Size: Normal)   Pulse (!) 110   Temp (!) 97.3 F (36.3 C) (Temporal)   Ht 5' 3"   (1.6 m)   Wt 122 lb 6.4 oz (55.5 kg)   LMP 11/24/2014   SpO2 96%   BMI 21.68 kg/m   Physical Exam  Constitutional: She is oriented to person, place, and time. She appears well-developed. No distress.  Cardiovascular: Normal rate and regular rhythm.  Pulmonary/Chest: Effort normal. No respiratory distress.  Neurological: She is alert and oriented to person, place, and time. Coordination normal.     A/P:  1. Routine screening for STI (sexually transmitted infection) 2. Possible exposure to STD - gonorrhea, chlamydia, trichomonas all negative - test  done 1 week ago - HIV Antibody (routine testing w rflx) - RPR - Hepatitis B surface antigen - Hepatitis C Antibody  3. Post menopausal syndrome - agree to 3 mo trial to see if hot flashes, mood/irritability, vaginal dryness improve Rx: - estrogens, conjugated, (PREMARIN) 0.3 MG tablet; Take 1 tablet (0.3 mg total) by mouth daily.  Dispense: 30 tablet; Refill: 2 Discussed plan and reviewed medications with patient, including risks, benefits, and potential side effects. Pt expressed understand. All questions answered.  4. Anxiety - TSH - T4, free  5. Uncontrolled type 2 diabetes mellitus with hyperglycemia ([place]) - victoza was Rx'd last week but not covered by insurance - pt is on glipizide and farxiga; she has been on metformin, actos, januvia in the past but was not able to tolerate - referral previously placed to endo and pt needs to return call to that office to schedule - will have CMA call pharmacy to see if trulicity or ozempic are covered. If so, will Rx one of these and if not will likely need PA for approval of victoza or can wait for pt to establish with endo

## 2019-06-16 NOTE — Patient Instructions (Addendum)
Health Maintenance Due  Topic Date Due  . PNEUMOCOCCAL POLYSACCHARIDE VACCINE AGE 54-64 HIGH RISK  03/06/1968  . FOOT EXAM  03/06/1976  . OPHTHALMOLOGY EXAM Patient has an appointment coming on 06/29/2019. 03/06/1976  . TETANUS/TDAP  03/06/1985  . PAP SMEAR-Modifier  03/07/1987  . COLONOSCOPY  03/06/2016  Patient will discuss during visit.  Depression screen PHQ 2/9 05/05/2019  Decreased Interest 0  Down, Depressed, Hopeless 0  PHQ - 2 Score 0

## 2019-06-17 LAB — HEPATITIS B SURFACE ANTIGEN: Hepatitis B Surface Ag: NEGATIVE

## 2019-06-17 LAB — T4, FREE: Free T4: 1.41 ng/dL (ref 0.82–1.77)

## 2019-06-17 LAB — TSH: TSH: 0.201 u[IU]/mL — ABNORMAL LOW (ref 0.450–4.500)

## 2019-06-17 LAB — HIV ANTIBODY (ROUTINE TESTING W REFLEX): HIV Screen 4th Generation wRfx: NONREACTIVE

## 2019-06-17 LAB — HEPATITIS C ANTIBODY: Hep C Virus Ab: <0.1 s/co ratio (ref 0.0–0.9)

## 2019-06-17 LAB — RPR: RPR Ser Ql: NONREACTIVE

## 2019-06-19 ENCOUNTER — Telehealth: Payer: Self-pay | Admitting: Family Medicine

## 2019-06-19 LAB — URINE CYTOLOGY ANCILLARY ONLY
Bacterial Vaginitis-Urine: NEGATIVE
Candida Urine: NEGATIVE
Chlamydia: NEGATIVE
Comment: NEGATIVE
Comment: NEGATIVE
Comment: NORMAL
Neisseria Gonorrhea: NEGATIVE
Trichomonas: NEGATIVE

## 2019-06-19 NOTE — Telephone Encounter (Signed)
Patient calling about labs results and need to clarify some results. Please call patient [phone].

## 2019-06-19 NOTE — Telephone Encounter (Signed)
I spoke with pt and informed her that her labs have not been resulted as of yet.  I informed pt that Dr.C is out of office until Wednesday, and hopefully will have those results for her by the end of this week.

## 2019-06-21 ENCOUNTER — Other Ambulatory Visit: Payer: Self-pay | Admitting: Family Medicine

## 2019-06-21 ENCOUNTER — Telehealth: Payer: Self-pay | Admitting: Family Medicine

## 2019-06-21 DIAGNOSIS — F419 Anxiety disorder, unspecified: Secondary | ICD-10-CM

## 2019-06-21 DIAGNOSIS — R7989 Other specified abnormal findings of blood chemistry: Secondary | ICD-10-CM

## 2019-06-21 DIAGNOSIS — R5383 Other fatigue: Secondary | ICD-10-CM

## 2019-06-21 NOTE — Telephone Encounter (Signed)
Patient is calling office about the status of authorization for Victoza. Please call patient at [phone]

## 2019-06-22 ENCOUNTER — Other Ambulatory Visit: Payer: Self-pay | Admitting: Family Medicine

## 2019-06-22 DIAGNOSIS — E1165 Type 2 diabetes mellitus with hyperglycemia: Secondary | ICD-10-CM

## 2019-06-22 DIAGNOSIS — B001 Herpesviral vesicular dermatitis: Secondary | ICD-10-CM

## 2019-06-22 MED ORDER — TRULICITY 0.75 MG/0.5ML ~~LOC~~ SOAJ: 0.5000 mL | SUBCUTANEOUS | 5 refills | Status: DC

## 2019-06-22 MED ORDER — VALACYCLOVIR HCL 500 MG PO TABS: 500.0000 mg | ORAL_TABLET | Freq: Two times a day (BID) | ORAL | 0 refills | Status: DC

## 2019-06-22 NOTE — Telephone Encounter (Signed)
Patient is returning your call.  

## 2019-06-23 NOTE — Telephone Encounter (Signed)
Pt did speak with Tanya in regards to medication.  Dr. Loletha Grayer sent in alternative medication yesterday.

## 2019-06-26 ENCOUNTER — Telehealth: Payer: Self-pay | Admitting: Family Medicine

## 2019-06-26 NOTE — Telephone Encounter (Signed)
Pt is calling and would like a call back at earliest convenience, she asked to speak to Dr De Hollingshead nurse. She wants to discuss medication she was put on

## 2019-06-27 NOTE — Telephone Encounter (Signed)
Left message on voicemail to call office.  

## 2019-06-28 DIAGNOSIS — F411 Generalized anxiety disorder: Secondary | ICD-10-CM | POA: Diagnosis not present

## 2019-06-28 DIAGNOSIS — F250 Schizoaffective disorder, bipolar type: Secondary | ICD-10-CM | POA: Diagnosis not present

## 2019-06-28 DIAGNOSIS — F3132 Bipolar disorder, current episode depressed, moderate: Secondary | ICD-10-CM | POA: Diagnosis not present

## 2019-06-29 ENCOUNTER — Ambulatory Visit: Payer: Medicare Other | Admitting: Gastroenterology

## 2019-06-30 ENCOUNTER — Other Ambulatory Visit: Payer: Self-pay | Admitting: Family Medicine

## 2019-06-30 DIAGNOSIS — B001 Herpesviral vesicular dermatitis: Secondary | ICD-10-CM

## 2019-06-30 NOTE — Telephone Encounter (Signed)
Left message on voicemail to call office.  

## 2019-07-01 ENCOUNTER — Encounter: Payer: Self-pay | Admitting: Family Medicine

## 2019-07-03 NOTE — Telephone Encounter (Signed)
Last OV 06/16/19 Last fill 06/22/19  #30/0

## 2019-07-03 NOTE — Telephone Encounter (Signed)
[facility] note received via fax  Call: Triage/Clinical  Date:06/29/2019 Time:5:45pm   Caller:Self Return [phone]  Nurse: Lorenza Burton, RN  Chief Complaint:Symptomatic/Requestfor Health Information  Reason for call:Caller states that she feels feverish, achy and clammy.  She states that she had blood work done and was prescribed an antibiotic and has been taking her temp and the highest it gets is 73 but it has been saying 95.  Related visit to physician within the last 2 weeks:Yes  Guideline: Weakness and fatique  Disposition:  Send to urgent queue   Call EMS 911 NOW

## 2019-07-03 NOTE — Progress Notes (Signed)
Pt is aware of these results

## 2019-07-04 NOTE — Telephone Encounter (Signed)
Patient is returning the call. CB is [phone].

## 2019-07-05 NOTE — Telephone Encounter (Signed)
Please ask pt to bring documentation from dental office as to the recommendation to be tested for mouth cancer. There is no screening for oral-pharyngeal cancers. Was there a specific lesion or area of concern? I'm not going to be able to do anything without some sort of documentation or more specific info about dentist's concern

## 2019-07-05 NOTE — Telephone Encounter (Signed)
I spoke with pt and she informed me that she has an appointment with Dr. Loletha Grayer for this Friday.  Pt said that she has an scheduled appt with GI on Friday morning and appt for Endo on next Tuesday.  Pt also said that when she went to [facility] (dental office) they told her that she needed to be tested for mouth cancer.  Pt said that the dental office did not accept her insurance to perform that test and that she only had a consultation with them.  F.Y.I

## 2019-07-06 NOTE — Telephone Encounter (Signed)
I spoke with pt and informed her of Dr. Lurline Del message.  Pt said that she will ask if the dental office can fax the consultation information to this office.  I also informed pt that she could also pick up documentation from dental office and bring it with her to appointment tomorrow.  Pt verbalized understanding.

## 2019-07-07 ENCOUNTER — Ambulatory Visit: Payer: Medicare Other | Admitting: Family Medicine

## 2019-07-07 ENCOUNTER — Encounter: Payer: Self-pay | Admitting: Family Medicine

## 2019-07-07 ENCOUNTER — Ambulatory Visit: Payer: Medicare Other | Admitting: Gastroenterology

## 2019-07-07 ENCOUNTER — Other Ambulatory Visit: Payer: Self-pay

## 2019-07-07 DIAGNOSIS — Z5321 Procedure and treatment not carried out due to patient leaving prior to being seen by health care provider: Secondary | ICD-10-CM

## 2019-07-07 NOTE — Progress Notes (Signed)
Virtual Visit via Telephone Note  I connected with Courtney Bennett on 07/07/19 at  2:00 PM EST by telephone and verified that I am speaking with the correct person using two identifiers.   I discussed the limitations, risks, security and privacy concerns of performing an evaluation and management service by telephone and the availability of in person appointments. I also discussed with the patient that there may be a patient responsible charge related to this service. The patient expressed understanding and agreed to proceed.  Location patient: home Location provider: work  Participants present for the call: patient, provider Patient did not have a visit in the prior 7 days to address this/these issue(s).  No chief complaint on file.    History of Present Illness: Courtney Bennett is a 54 y.o. female  I called pt after she came into office and endorsed "low grade fever", sinus pressure, nasal congestion and was told she could not be seen in person d/t symptoms. She was called yesterday to prescreen, did not answer, and message was left to return call to office. Pt did not do this. I offered to have the encounter via telephone and pt was directed to return to her car. Pt was witnessed by 2 front office staff telling one of the staff members to "go f*@k  yourself". I called pt and explained to her that language and behavior such as she displayed is not tolerated in our office. She initially said she did not recall using such language but then said it wasn't "directed at any person". I started to say that regardless that type of language in unacceptable and that our office has a zero tolerance policy for such behavior, and then pt disconnected the call.    Past Medical History:  Diagnosis Date  . Bipolar 1 disorder ([place])   . Diabetes mellitus   . Fibromyalgia   . Hypertension     Past Surgical History:  Procedure Laterality Date  . ABDOMINAL HYSTERECTOMY  01/2019  . CESAREAN SECTION    .  HERNIA REPAIR      Social History   Tobacco Use  . Smoking status: Former Smoker    Types: Cigarettes    Quit date: 11/21/2014    Years since quitting: 4.6  . Smokeless tobacco: Never Used  Substance Use Topics  . Alcohol use: Yes    Comment: rare  . Drug use: No    No family history on file.  Outpatient Encounter Medications as of 07/07/2019  Medication Sig  . Accu-Chek Softclix Lancets lancets CHECK BLOOD SUGAR ONCE A DAY. E11.42  . atorvastatin (LIPITOR) 20 MG tablet Take 20 mg by mouth daily.  . blood glucose meter kit and supplies Dispense based on patient and insurance preference. Use up to four times daily as directed. (FOR ICD-10 E10.9, E11.9).  Marland Kitchen Continuous Blood Gluc Sensor (FREESTYLE LIBRE 14 DAY SENSOR) MISC 1 application by Subdermal route See admin instructions. Check blood glucose 1-2 times daily. E11.65  . dapagliflozin propanediol (FARXIGA) 10 MG TABS tablet Take 10 mg by mouth daily before breakfast.  . Dulaglutide (TRULICITY) 5.10 CH/8.5ID SOPN Inject 0.5 mLs into the skin once a week.  . DULoxetine (CYMBALTA) 60 MG capsule Take 1 capsule (60 mg total) by mouth daily.  Marland Kitchen estrogens, conjugated, (PREMARIN) 0.3 MG tablet Take 1 tablet (0.3 mg total) by mouth daily.  Marland Kitchen glipiZIDE (GLUCOTROL) 5 MG tablet Take 1 tablet (5 mg total) by mouth 2 (two) times daily before a meal.  .  glucose blood (ACCU-CHEK AVIVA PLUS) test strip 100 each by Misc.(Non-Drug; Combo Route) route 3 times daily.  Marland Kitchen ketoconazole (NIZORAL) 2 % shampoo Apply 1 application topically 2 (two) times a week.  Marland Kitchen lisinopril (ZESTRIL) 40 MG tablet Take 1 tablet by mouth daily.  . metoCLOPramide (REGLAN) 10 MG tablet Take 1 tablet (10 mg total) by mouth 4 (four) times daily -  before meals and at bedtime.  . sertraline (ZOLOFT) 100 MG tablet Take 1.5 tablets (150 mg total) by mouth daily.  . valACYclovir (VALTREX) 500 MG tablet TAKE 1 TABLET (500 MG TOTAL) BY MOUTH 2 (TWO) TIMES DAILY FOR 30 DAYS.  Marland Kitchen  venlafaxine XR (EFFEXOR-XR) 75 MG 24 hr capsule Take 1 capsule (75 mg total) by mouth daily with breakfast.   No facility-administered encounter medications on file as of 07/07/2019.     Allergies  Allergen Reactions  . Metformin Diarrhea  . Sulfur   . Lamictal [Lamotrigine] Rash  . Latex Itching and Rash      ROS: See pertinent positives and negatives per HPI.   Observations/Objective: none  Assessment and Plan: 1. Patient left without being seen - I called pt after she came into office and endorsed "low grade fever", sinus pressure, nasal congestion and was told she could not be seen in person d/t symptoms. She was called yesterday to prescreen, did not answer, and message was left to return call to office. Pt did not do this. I offered to have the encounter via telephone and pt was directed to return to her car. Pt was witnessed by 2 front office staff telling one of the staff members to "go f*@k  yourself". I called pt and explained to her that language and behavior such as she displayed is not tolerated in our office. She initially said she did not recall using such language but then said it wasn't "directed at any person". I started to say that regardless that type of language in unacceptable and that our office has a zero tolerance policy for such behavior, and then pt disconnected the call.  - spoke with office manager Willia Craze regarding this situation and she is in agreement with me that pt should be discharged from our practice   I did not refer this patient for an OV in the next 24 hours for this/these issue(s).  I discussed the assessment and treatment plan with the patient. The patient was provided an opportunity to ask questions and all were answered. The patient agreed with the plan and demonstrated an understanding of the instructions.   The patient was advised to call back or seek an in-person evaluation if the symptoms worsen or if the condition fails to improve as  anticipated.  I provided 3 minutes of non-face-to-face time during this encounter.   Letta Median, DO

## 2019-07-08 ENCOUNTER — Other Ambulatory Visit: Payer: Self-pay | Admitting: Family Medicine

## 2019-07-10 NOTE — Telephone Encounter (Signed)
MC-On 2.5.21 pt left without being seen/plz advise on refill/thx dmf

## 2019-07-14 ENCOUNTER — Encounter: Payer: Self-pay | Admitting: Family Medicine

## 2019-07-19 ENCOUNTER — Ambulatory Visit: Payer: Medicare Other | Admitting: Gastroenterology

## 2019-07-20 ENCOUNTER — Other Ambulatory Visit: Payer: Self-pay | Admitting: Family Medicine

## 2019-07-20 DIAGNOSIS — E119 Type 2 diabetes mellitus without complications: Secondary | ICD-10-CM

## 2019-07-20 NOTE — Telephone Encounter (Signed)
MC-Plz see refill req/thx dmf 

## 2019-07-21 ENCOUNTER — Other Ambulatory Visit: Payer: Self-pay

## 2019-07-21 ENCOUNTER — Ambulatory Visit: Payer: Medicare Other | Admitting: Endocrinology

## 2019-07-21 ENCOUNTER — Other Ambulatory Visit: Payer: Self-pay | Admitting: Family Medicine

## 2019-07-21 DIAGNOSIS — B001 Herpesviral vesicular dermatitis: Secondary | ICD-10-CM

## 2019-07-21 NOTE — Telephone Encounter (Signed)
Last OV 07/07/19 Last fill 07/03/19  #30/0

## 2019-07-25 ENCOUNTER — Ambulatory Visit: Payer: Medicare Other | Admitting: Endocrinology

## 2019-07-25 ENCOUNTER — Other Ambulatory Visit: Payer: Self-pay

## 2019-07-27 ENCOUNTER — Ambulatory Visit: Payer: Medicare Other | Admitting: Endocrinology

## 2019-08-09 ENCOUNTER — Other Ambulatory Visit: Payer: Self-pay | Admitting: Family Medicine

## 2019-08-09 DIAGNOSIS — E119 Type 2 diabetes mellitus without complications: Secondary | ICD-10-CM

## 2019-08-09 DIAGNOSIS — B001 Herpesviral vesicular dermatitis: Secondary | ICD-10-CM

## 2019-08-09 NOTE — Telephone Encounter (Signed)
Rx sent in patient aware 

## 2019-08-10 ENCOUNTER — Other Ambulatory Visit: Payer: Self-pay

## 2019-08-14 ENCOUNTER — Other Ambulatory Visit: Payer: Self-pay

## 2019-08-14 ENCOUNTER — Ambulatory Visit (INDEPENDENT_AMBULATORY_CARE_PROVIDER_SITE_OTHER): Payer: Medicare Other | Admitting: Endocrinology

## 2019-08-14 ENCOUNTER — Encounter: Payer: Self-pay | Admitting: Endocrinology

## 2019-08-14 VITALS — BP 160/94 | HR 97 | Ht 63.0 in | Wt 121.0 lb

## 2019-08-14 DIAGNOSIS — E119 Type 2 diabetes mellitus without complications: Secondary | ICD-10-CM | POA: Diagnosis not present

## 2019-08-14 DIAGNOSIS — E1142 Type 2 diabetes mellitus with diabetic polyneuropathy: Secondary | ICD-10-CM

## 2019-08-14 DIAGNOSIS — E0590 Thyrotoxicosis, unspecified without thyrotoxic crisis or storm: Secondary | ICD-10-CM

## 2019-08-14 LAB — POCT GLYCOSYLATED HEMOGLOBIN (HGB A1C): Hemoglobin A1C: 11.1 % — AB (ref 4.0–5.6)

## 2019-08-14 MED ORDER — TRULICITY 1.5 MG/0.5ML ~~LOC~~ SOAJ: 1.5000 mg | SUBCUTANEOUS | 11 refills | Status: DC

## 2019-08-14 MED ORDER — ACCU-CHEK AVIVA PLUS W/DEVICE KIT: 1.0000 | PACK | Freq: Once | 0 refills | Status: AC

## 2019-08-14 MED ORDER — FARXIGA 10 MG PO TABS: 10.0000 mg | ORAL_TABLET | Freq: Every day | ORAL | 3 refills | Status: DC

## 2019-08-14 MED ORDER — ACCU-CHEK AVIVA PLUS VI STRP: 1.0000 | ORAL_STRIP | Freq: Every day | 3 refills | Status: DC

## 2019-08-14 MED ORDER — GLIPIZIDE 5 MG PO TABS: 5.0000 mg | ORAL_TABLET | Freq: Two times a day (BID) | ORAL | 11 refills | Status: DC

## 2019-08-14 NOTE — Patient Instructions (Addendum)
Your blood pressure is high today.  Please see your primary care provider soon, to have it rechecked let's check a thyroid "scan" (a special, but easy and painless type of thyroid x ray).  It works like this: you go to the x-ray department of the hospital to swallow a pill, which contains a miniscule amount of radiation.  You will not notice any symptoms from this.  You will go back to the x-ray department the next day, to lie down in front of a camera.  The results of this will be sent to me.   Based on the results, i hope to order for you a treatment pill of radioactive iodine.  Although it is a larger amount of radiation, you will again notice no symptoms from this.  The pill is gone from your body in a few days (during which you should stay away from other people), but takes several months to work.  Therefore, please return here approximately 6-8 weeks after the treatment.  This treatment has been available for many years, and the only known side-effect is an underactive thyroid.  It is possible that i would eventually prescribe for you a thyroid hormone pill, which is very inexpensive.  You don't have to worry about side-effects of this thyroid hormone pill, because it is the same molecule your thyroid makes. good diet and exercise significantly improve the control of your diabetes.  please let me know if you wish to be referred to a dietician.  high blood sugar is very risky to your health.  you should see an eye doctor and dentist every year.  It is very important to get all recommended vaccinations.  Controlling your blood pressure and cholesterol drastically reduces the damage diabetes does to your body.  Those who smoke should quit.  Please discuss these with your doctor.  I have sent a prescription to your pharmacy, to double the Trulicity. Also, please resume the [place] and glipizide We will need to take this complex situation in stages. Also, I have sent a prescription to your pharmacy, for a  new meter. Please come back for a follow-up appointment in 1 month.

## 2019-08-14 NOTE — Progress Notes (Signed)
Subjective:    Patient ID: Courtney Bennett, female    DOB: [birth year], 54 y.o.   MRN: [id number]  HPI pt is referred by Dr Bryan Lemma, for diabetes.  Pt states GDM was dx'ed in 2004 (when she weighed 350 lbs), and persisted after the pregnancy; it is complicated by PN; she has never been on insulin; pt says her diet and exercise are fair; she has never had pancreatitis, pancreatic surgery, DKA, and severe hypoglycemia.  Past Medical History:  Diagnosis Date  . Bipolar 1 disorder ([place])   . Diabetes mellitus   . Fibromyalgia   . Hypertension     Past Surgical History:  Procedure Laterality Date  . ABDOMINAL HYSTERECTOMY  01/2019  . CESAREAN SECTION    . HERNIA REPAIR      Social History   Socioeconomic History  . Marital status: Single    Spouse name: Not on file  . Number of children: Not on file  . Years of education: Not on file  . Highest education level: Not on file  Occupational History  . Not on file  Tobacco Use  . Smoking status: Former Smoker    Types: Cigarettes    Quit date: 11/21/2014    Years since quitting: 4.7  . Smokeless tobacco: Never Used  Substance and Sexual Activity  . Alcohol use: Yes    Comment: rare  . Drug use: No  . Sexual activity: Not on file  Other Topics Concern  . Not on file  Social History Narrative  . Not on file   Social Determinants of Health   Financial Resource Strain:   . Difficulty of Paying Living Expenses:   Food Insecurity:   . Worried About Charity fundraiser in the Last Year:   . Arboriculturist in the Last Year:   Transportation Needs:   . Film/video editor (Medical):   Marland Kitchen Lack of Transportation (Non-Medical):   Physical Activity:   . Days of Exercise per Week:   . Minutes of Exercise per Session:   Stress:   . Feeling of Stress :   Social Connections:   . Frequency of Communication with Friends and Family:   . Frequency of Social Gatherings with Friends and Family:   . Attends Religious Services:    . Active Member of Clubs or Organizations:   . Attends Archivist Meetings:   Marland Kitchen Marital Status:   Intimate Partner Violence:   . Fear of Current or Ex-Partner:   . Emotionally Abused:   Marland Kitchen Physically Abused:   . Sexually Abused:     Current Outpatient Medications on File Prior to Visit  Medication Sig Dispense Refill  . atorvastatin (LIPITOR) 20 MG tablet Take 20 mg by mouth daily.    . Continuous Blood Gluc Sensor (FREESTYLE LIBRE 14 DAY SENSOR) MISC 1 application by Subdermal route See admin instructions. Check blood glucose 1-2 times daily. E11.65 (Patient not taking: Reported on 08/14/2019) 2 each 11  . DULoxetine (CYMBALTA) 60 MG capsule Take 1 capsule (60 mg total) by mouth daily. (Patient not taking: Reported on 08/14/2019) 90 capsule 1  . estrogens, conjugated, (PREMARIN) 0.3 MG tablet Take 1 tablet (0.3 mg total) by mouth daily. (Patient not taking: Reported on 08/14/2019) 30 tablet 2  . ketoconazole (NIZORAL) 2 % shampoo APPLY 1 APPLICATION TOPICALLY 2 (TWO) TIMES A WEEK. (Patient not taking: Reported on 08/14/2019) 120 mL 0  . lisinopril (ZESTRIL) 40 MG tablet Take 1 tablet by mouth  daily.    . metoCLOPramide (REGLAN) 10 MG tablet Take 1 tablet (10 mg total) by mouth 4 (four) times daily -  before meals and at bedtime. (Patient not taking: Reported on 08/14/2019) 56 tablet 0  . sertraline (ZOLOFT) 100 MG tablet Take 1.5 tablets (150 mg total) by mouth daily. (Patient not taking: Reported on 08/14/2019) 135 tablet 3  . valACYclovir (VALTREX) 500 MG tablet TAKE 1 TABLET (500 MG TOTAL) BY MOUTH 2 (TWO) TIMES DAILY FOR 30 DAYS. (Patient not taking: Reported on 08/14/2019) 180 tablet 1  . venlafaxine XR (EFFEXOR-XR) 75 MG 24 hr capsule Take 1 capsule (75 mg total) by mouth daily with breakfast. (Patient not taking: Reported on 08/14/2019) 90 capsule 3   No current facility-administered medications on file prior to visit.    Allergies  Allergen Reactions  . Metformin Diarrhea  .  Sulfur   . Lamictal [Lamotrigine] Rash  . Latex Itching and Rash    Family History  Problem Relation Age of Onset  . Diabetes Father     BP (!) 160/94   Pulse 97   Ht 5\' 3"  (1.6 m)   Wt 121 lb (54.9 kg)   LMP 11/24/2014   SpO2 97%   BMI 21.43 kg/m    Review of Systems denies blurry vision, chest pain, sob, n/v, and memory loss.  She has chronic abd pain.  She has chronic weight loss and frequent urination.  Depression is well-controlled.       Objective:   Physical Exam VS: see vs page GEN: no distress HEAD: head: no deformity eyes: no periorbital swelling, no proptosis external nose and ears are normal NECK: supple, thyroid is 3 times normal size, with MNG surface.  CHEST WALL: no deformity LUNGS: clear to auscultation CV: reg rate and rhythm, no murmur MUSCULOSKELETAL: muscle bulk and strength are grossly normal.  no obvious joint swelling.  gait is normal and steady.  EXTEMITIES: no deformity.  no ulcer on the feet.  feet are of normal color and temp.  no leg edema. bilateral onychomycosis of the toenails.   PULSES: dorsalis pedis intact bilat.  no carotid bruit NEURO:  cn 2-12 grossly intact.   readily moves all 4's.  sensation is intact to touch on the feet SKIN:  Normal texture and temperature.  No rash or suspicious lesion is visible.   NODES:  None palpable at the neck PSYCH: alert, well-oriented.  Does not appear anxious nor depressed.  Lab Results  Component Value Date   HGBA1C 11.1 (A) 08/14/2019   Lab Results  Component Value Date   TSH 0.201 (L) 06/16/2019   I have reviewed outside records, and summarized: Pt was noted to have elevated A1c, and referred here.  She had a VV re: URI sxs, but pt LWBS  I personally reviewed electrocardiogram tracing (11/24/14): Indication: pneumonia Impression: ST.  No MI.  No hypertrophy. No comparison is available.      Assessment & Plan:  MNG, new to me Hyperthyroidism, prob due to the MNG. Type 2 DM: severe  exacerbation: she is prob evolving type 1.  HTN: is noted today.    Patient Instructions  Your blood pressure is high today.  Please see your primary care provider soon, to have it rechecked let's check a thyroid "scan" (a special, but easy and painless type of thyroid x ray).  It works like this: you go to the x-ray department of the hospital to swallow a pill, which contains a miniscule amount of  radiation.  You will not notice any symptoms from this.  You will go back to the x-ray department the next day, to lie down in front of a camera.  The results of this will be sent to me.   Based on the results, i hope to order for you a treatment pill of radioactive iodine.  Although it is a larger amount of radiation, you will again notice no symptoms from this.  The pill is gone from your body in a few days (during which you should stay away from other people), but takes several months to work.  Therefore, please return here approximately 6-8 weeks after the treatment.  This treatment has been available for many years, and the only known side-effect is an underactive thyroid.  It is possible that i would eventually prescribe for you a thyroid hormone pill, which is very inexpensive.  You don't have to worry about side-effects of this thyroid hormone pill, because it is the same molecule your thyroid makes. good diet and exercise significantly improve the control of your diabetes.  please let me know if you wish to be referred to a dietician.  high blood sugar is very risky to your health.  you should see an eye doctor and dentist every year.  It is very important to get all recommended vaccinations.  Controlling your blood pressure and cholesterol drastically reduces the damage diabetes does to your body.  Those who smoke should quit.  Please discuss these with your doctor.  I have sent a prescription to your pharmacy, to double the Trulicity. Also, please resume the [place] and glipizide We will need to  take this complex situation in stages. Also, I have sent a prescription to your pharmacy, for a new meter. Please come back for a follow-up appointment in 1 month.

## 2019-08-16 ENCOUNTER — Telehealth: Payer: Self-pay | Admitting: Endocrinology

## 2019-08-16 NOTE — Telephone Encounter (Signed)
Pt does not qualify based on the following criteria:  Only checks CBG's qd Does not inject insulin 3 or more times per day  Based on above, Rx will not be sent. No further action required.

## 2019-08-16 NOTE — Telephone Encounter (Signed)
Patient called to advise that per the [facility] in [place] if Dr Loanne Drilling called in an RX for the Free Style they can start the process for PA and insurance will cover the Free Style  Any questions please call patient at [phone]

## 2019-08-23 ENCOUNTER — Telehealth: Payer: Self-pay | Admitting: Endocrinology

## 2019-08-23 NOTE — Telephone Encounter (Signed)
Patient requests a new RX for the following:  MEDICATION:  Freestyle Libre 14 day Sensors  PHARMACY:   [facility], [place] Phone:  [phone]  Fax:  [phone]      IS THIS A 90 DAY SUPPLY : ?  IS PATIENT OUT OF MEDICATION: Yes  IF NOT; HOW MUCH IS LEFT: 0  LAST APPOINTMENT DATE: @3 /17/2021  NEXT APPOINTMENT DATE:@4 /20/2021  DO WE HAVE YOUR PERMISSION TO LEAVE A DETAILED MESSAGE: Yes  OTHER COMMENTS:    **Let patient know to contact pharmacy at the end of the day to make sure medication is ready. **  ** Please notify patient to allow 48-72 hours to process**  **Encourage patient to contact the pharmacy for refills or they can request refills through [facility]**

## 2019-08-23 NOTE — Telephone Encounter (Signed)
As previously documented:  Pt does not qualify based on the following criteria:   Only checks CBG's qd Does not inject insulin 3 or more times per day   Based on above, Rx will not be sent. No further action required.  LVM requesting returned call.

## 2019-08-30 ENCOUNTER — Ambulatory Visit: Payer: Medicare Other | Admitting: Gastroenterology

## 2019-09-04 DIAGNOSIS — E1165 Type 2 diabetes mellitus with hyperglycemia: Secondary | ICD-10-CM | POA: Diagnosis not present

## 2019-09-04 DIAGNOSIS — R9431 Abnormal electrocardiogram [ECG] [EKG]: Secondary | ICD-10-CM | POA: Diagnosis not present

## 2019-09-04 DIAGNOSIS — R1084 Generalized abdominal pain: Secondary | ICD-10-CM | POA: Diagnosis not present

## 2019-09-04 DIAGNOSIS — R5383 Other fatigue: Secondary | ICD-10-CM | POA: Diagnosis not present

## 2019-09-04 DIAGNOSIS — Z1331 Encounter for screening for depression: Secondary | ICD-10-CM | POA: Diagnosis not present

## 2019-09-04 DIAGNOSIS — Z0000 Encounter for general adult medical examination without abnormal findings: Secondary | ICD-10-CM | POA: Diagnosis not present

## 2019-09-04 DIAGNOSIS — Z1159 Encounter for screening for other viral diseases: Secondary | ICD-10-CM | POA: Diagnosis not present

## 2019-09-04 DIAGNOSIS — E559 Vitamin D deficiency, unspecified: Secondary | ICD-10-CM | POA: Diagnosis not present

## 2019-09-04 DIAGNOSIS — E7800 Pure hypercholesterolemia, unspecified: Secondary | ICD-10-CM | POA: Diagnosis not present

## 2019-09-11 DIAGNOSIS — R0989 Other specified symptoms and signs involving the circulatory and respiratory systems: Secondary | ICD-10-CM | POA: Diagnosis not present

## 2019-09-11 DIAGNOSIS — R1032 Left lower quadrant pain: Secondary | ICD-10-CM | POA: Diagnosis not present

## 2019-09-11 DIAGNOSIS — E079 Disorder of thyroid, unspecified: Secondary | ICD-10-CM | POA: Diagnosis not present

## 2019-09-11 DIAGNOSIS — R6889 Other general symptoms and signs: Secondary | ICD-10-CM | POA: Diagnosis not present

## 2019-09-18 DIAGNOSIS — D751 Secondary polycythemia: Secondary | ICD-10-CM | POA: Diagnosis not present

## 2019-09-18 DIAGNOSIS — E1165 Type 2 diabetes mellitus with hyperglycemia: Secondary | ICD-10-CM | POA: Diagnosis not present

## 2019-09-18 DIAGNOSIS — A048 Other specified bacterial intestinal infections: Secondary | ICD-10-CM | POA: Diagnosis not present

## 2019-09-18 DIAGNOSIS — Z6820 Body mass index (BMI) 20.0-20.9, adult: Secondary | ICD-10-CM | POA: Diagnosis not present

## 2019-09-19 ENCOUNTER — Ambulatory Visit: Payer: Medicare Other | Admitting: Endocrinology

## 2019-09-19 DIAGNOSIS — R109 Unspecified abdominal pain: Secondary | ICD-10-CM | POA: Diagnosis not present

## 2019-09-26 DIAGNOSIS — E538 Deficiency of other specified B group vitamins: Secondary | ICD-10-CM | POA: Diagnosis not present

## 2019-09-26 DIAGNOSIS — Z6820 Body mass index (BMI) 20.0-20.9, adult: Secondary | ICD-10-CM | POA: Diagnosis not present

## 2019-09-26 DIAGNOSIS — R1084 Generalized abdominal pain: Secondary | ICD-10-CM | POA: Diagnosis not present

## 2019-09-26 DIAGNOSIS — R079 Chest pain, unspecified: Secondary | ICD-10-CM | POA: Diagnosis not present

## 2019-09-29 DIAGNOSIS — B350 Tinea barbae and tinea capitis: Secondary | ICD-10-CM | POA: Diagnosis not present

## 2019-09-29 DIAGNOSIS — R0602 Shortness of breath: Secondary | ICD-10-CM | POA: Diagnosis not present

## 2019-09-29 DIAGNOSIS — E119 Type 2 diabetes mellitus without complications: Secondary | ICD-10-CM | POA: Diagnosis not present

## 2019-09-29 DIAGNOSIS — R079 Chest pain, unspecified: Secondary | ICD-10-CM | POA: Diagnosis not present

## 2019-09-29 DIAGNOSIS — E1165 Type 2 diabetes mellitus with hyperglycemia: Secondary | ICD-10-CM | POA: Diagnosis not present

## 2019-09-29 DIAGNOSIS — F17210 Nicotine dependence, cigarettes, uncomplicated: Secondary | ICD-10-CM | POA: Diagnosis not present

## 2019-09-29 DIAGNOSIS — J449 Chronic obstructive pulmonary disease, unspecified: Secondary | ICD-10-CM | POA: Diagnosis not present

## 2019-09-29 DIAGNOSIS — T148XXA Other injury of unspecified body region, initial encounter: Secondary | ICD-10-CM | POA: Diagnosis not present

## 2019-10-02 DIAGNOSIS — F17210 Nicotine dependence, cigarettes, uncomplicated: Secondary | ICD-10-CM | POA: Diagnosis not present

## 2019-10-02 DIAGNOSIS — E041 Nontoxic single thyroid nodule: Secondary | ICD-10-CM | POA: Diagnosis not present

## 2019-10-05 DIAGNOSIS — L282 Other prurigo: Secondary | ICD-10-CM | POA: Diagnosis not present

## 2019-10-05 DIAGNOSIS — L981 Factitial dermatitis: Secondary | ICD-10-CM | POA: Diagnosis not present

## 2019-10-10 DIAGNOSIS — R760 Raised antibody titer: Secondary | ICD-10-CM | POA: Diagnosis not present

## 2019-10-10 DIAGNOSIS — Z1211 Encounter for screening for malignant neoplasm of colon: Secondary | ICD-10-CM | POA: Diagnosis not present

## 2019-10-10 DIAGNOSIS — Z01818 Encounter for other preprocedural examination: Secondary | ICD-10-CM | POA: Diagnosis not present

## 2019-10-12 DIAGNOSIS — E041 Nontoxic single thyroid nodule: Secondary | ICD-10-CM | POA: Diagnosis not present

## 2019-10-19 DIAGNOSIS — R1084 Generalized abdominal pain: Secondary | ICD-10-CM | POA: Diagnosis not present

## 2019-10-23 DIAGNOSIS — E041 Nontoxic single thyroid nodule: Secondary | ICD-10-CM | POA: Diagnosis not present

## 2019-10-26 DIAGNOSIS — M1990 Unspecified osteoarthritis, unspecified site: Secondary | ICD-10-CM | POA: Diagnosis not present

## 2019-10-26 DIAGNOSIS — Z6820 Body mass index (BMI) 20.0-20.9, adult: Secondary | ICD-10-CM | POA: Diagnosis not present

## 2019-10-26 DIAGNOSIS — Z712 Person consulting for explanation of examination or test findings: Secondary | ICD-10-CM | POA: Diagnosis not present

## 2019-10-26 DIAGNOSIS — K0530 Chronic periodontitis, unspecified: Secondary | ICD-10-CM | POA: Diagnosis not present

## 2019-11-07 DIAGNOSIS — R1084 Generalized abdominal pain: Secondary | ICD-10-CM | POA: Diagnosis not present

## 2019-11-07 DIAGNOSIS — F39 Unspecified mood [affective] disorder: Secondary | ICD-10-CM | POA: Diagnosis not present

## 2019-11-07 DIAGNOSIS — N898 Other specified noninflammatory disorders of vagina: Secondary | ICD-10-CM | POA: Diagnosis not present

## 2019-11-07 DIAGNOSIS — R300 Dysuria: Secondary | ICD-10-CM | POA: Diagnosis not present

## 2019-11-07 DIAGNOSIS — R232 Flushing: Secondary | ICD-10-CM | POA: Diagnosis not present

## 2019-12-09 ENCOUNTER — Emergency Department (HOSPITAL_COMMUNITY): Payer: Medicare Other

## 2019-12-09 ENCOUNTER — Encounter (HOSPITAL_COMMUNITY): Payer: Self-pay

## 2019-12-09 ENCOUNTER — Inpatient Hospital Stay (HOSPITAL_COMMUNITY)
Admission: EM | Admit: 2019-12-09 | Discharge: 2019-12-15 | DRG: 871 | Disposition: A | Discharge status: Home / Self Care | Payer: Medicare Other | Attending: Internal Medicine | Admitting: Internal Medicine

## 2019-12-09 DIAGNOSIS — B377 Candidal sepsis: Secondary | ICD-10-CM | POA: Diagnosis not present

## 2019-12-09 DIAGNOSIS — Z4659 Encounter for fitting and adjustment of other gastrointestinal appliance and device: Secondary | ICD-10-CM

## 2019-12-09 DIAGNOSIS — J9601 Acute respiratory failure with hypoxia: Secondary | ICD-10-CM | POA: Diagnosis present

## 2019-12-09 DIAGNOSIS — I9589 Other hypotension: Secondary | ICD-10-CM | POA: Diagnosis not present

## 2019-12-09 DIAGNOSIS — R7989 Other specified abnormal findings of blood chemistry: Secondary | ICD-10-CM | POA: Diagnosis present

## 2019-12-09 DIAGNOSIS — R6521 Severe sepsis with septic shock: Secondary | ICD-10-CM | POA: Diagnosis present

## 2019-12-09 DIAGNOSIS — E1140 Type 2 diabetes mellitus with diabetic neuropathy, unspecified: Secondary | ICD-10-CM | POA: Diagnosis present

## 2019-12-09 DIAGNOSIS — E876 Hypokalemia: Secondary | ICD-10-CM | POA: Diagnosis present

## 2019-12-09 DIAGNOSIS — Z833 Family history of diabetes mellitus: Secondary | ICD-10-CM

## 2019-12-09 DIAGNOSIS — M797 Fibromyalgia: Secondary | ICD-10-CM | POA: Diagnosis present

## 2019-12-09 DIAGNOSIS — K922 Gastrointestinal hemorrhage, unspecified: Secondary | ICD-10-CM | POA: Diagnosis present

## 2019-12-09 DIAGNOSIS — A419 Sepsis, unspecified organism: Secondary | ICD-10-CM | POA: Diagnosis present

## 2019-12-09 DIAGNOSIS — Z882 Allergy status to sulfonamides status: Secondary | ICD-10-CM

## 2019-12-09 DIAGNOSIS — F310 Bipolar disorder, current episode hypomanic: Secondary | ICD-10-CM | POA: Diagnosis present

## 2019-12-09 DIAGNOSIS — Z91040 Latex allergy status: Secondary | ICD-10-CM

## 2019-12-09 DIAGNOSIS — E042 Nontoxic multinodular goiter: Secondary | ICD-10-CM | POA: Diagnosis present

## 2019-12-09 DIAGNOSIS — Z90710 Acquired absence of both cervix and uterus: Secondary | ICD-10-CM

## 2019-12-09 DIAGNOSIS — E860 Dehydration: Secondary | ICD-10-CM | POA: Diagnosis not present

## 2019-12-09 DIAGNOSIS — Z87891 Personal history of nicotine dependence: Secondary | ICD-10-CM

## 2019-12-09 DIAGNOSIS — E349 Endocrine disorder, unspecified: Secondary | ICD-10-CM | POA: Diagnosis present

## 2019-12-09 DIAGNOSIS — R34 Anuria and oliguria: Secondary | ICD-10-CM | POA: Diagnosis not present

## 2019-12-09 DIAGNOSIS — R4182 Altered mental status, unspecified: Secondary | ICD-10-CM | POA: Diagnosis not present

## 2019-12-09 DIAGNOSIS — R6520 Severe sepsis without septic shock: Secondary | ICD-10-CM

## 2019-12-09 DIAGNOSIS — Z79899 Other long term (current) drug therapy: Secondary | ICD-10-CM

## 2019-12-09 DIAGNOSIS — T4275XA Adverse effect of unspecified antiepileptic and sedative-hypnotic drugs, initial encounter: Secondary | ICD-10-CM | POA: Diagnosis not present

## 2019-12-09 DIAGNOSIS — T17908A Unspecified foreign body in respiratory tract, part unspecified causing other injury, initial encounter: Secondary | ICD-10-CM | POA: Diagnosis present

## 2019-12-09 DIAGNOSIS — K920 Hematemesis: Secondary | ICD-10-CM | POA: Diagnosis present

## 2019-12-09 DIAGNOSIS — N179 Acute kidney failure, unspecified: Secondary | ICD-10-CM | POA: Diagnosis present

## 2019-12-09 DIAGNOSIS — Z452 Encounter for adjustment and management of vascular access device: Secondary | ICD-10-CM

## 2019-12-09 DIAGNOSIS — E1165 Type 2 diabetes mellitus with hyperglycemia: Secondary | ICD-10-CM

## 2019-12-09 DIAGNOSIS — E1110 Type 2 diabetes mellitus with ketoacidosis without coma: Secondary | ICD-10-CM | POA: Diagnosis present

## 2019-12-09 DIAGNOSIS — Z978 Presence of other specified devices: Secondary | ICD-10-CM

## 2019-12-09 DIAGNOSIS — F22 Delusional disorders: Secondary | ICD-10-CM | POA: Diagnosis present

## 2019-12-09 DIAGNOSIS — I4892 Unspecified atrial flutter: Secondary | ICD-10-CM | POA: Diagnosis present

## 2019-12-09 DIAGNOSIS — Z888 Allergy status to other drugs, medicaments and biological substances status: Secondary | ICD-10-CM

## 2019-12-09 DIAGNOSIS — J690 Pneumonitis due to inhalation of food and vomit: Secondary | ICD-10-CM | POA: Diagnosis present

## 2019-12-09 DIAGNOSIS — K5900 Constipation, unspecified: Secondary | ICD-10-CM | POA: Diagnosis not present

## 2019-12-09 DIAGNOSIS — R1310 Dysphagia, unspecified: Secondary | ICD-10-CM | POA: Diagnosis present

## 2019-12-09 DIAGNOSIS — R443 Hallucinations, unspecified: Secondary | ICD-10-CM | POA: Diagnosis present

## 2019-12-09 DIAGNOSIS — Z7984 Long term (current) use of oral hypoglycemic drugs: Secondary | ICD-10-CM

## 2019-12-09 DIAGNOSIS — L89156 Pressure-induced deep tissue damage of sacral region: Secondary | ICD-10-CM | POA: Diagnosis present

## 2019-12-09 DIAGNOSIS — Z20822 Contact with and (suspected) exposure to covid-19: Secondary | ICD-10-CM | POA: Diagnosis present

## 2019-12-09 DIAGNOSIS — H3530 Unspecified macular degeneration: Secondary | ICD-10-CM | POA: Diagnosis present

## 2019-12-09 DIAGNOSIS — F319 Bipolar disorder, unspecified: Secondary | ICD-10-CM | POA: Diagnosis present

## 2019-12-09 DIAGNOSIS — G9341 Metabolic encephalopathy: Secondary | ICD-10-CM | POA: Diagnosis present

## 2019-12-09 DIAGNOSIS — D72829 Elevated white blood cell count, unspecified: Secondary | ICD-10-CM | POA: Diagnosis present

## 2019-12-09 DIAGNOSIS — I10 Essential (primary) hypertension: Secondary | ICD-10-CM | POA: Diagnosis present

## 2019-12-09 LAB — CBC WITH DIFFERENTIAL/PLATELET
Abs Immature Granulocytes: 0.4 10*3/uL — ABNORMAL HIGH (ref 0.00–0.07)
Basophils Absolute: 0.1 10*3/uL (ref 0.0–0.1)
Basophils Relative: 0 %
Eosinophils Absolute: 0 10*3/uL (ref 0.0–0.5)
Eosinophils Relative: 0 %
HCT: 52.8 % — ABNORMAL HIGH (ref 36.0–46.0)
Hemoglobin: 16.7 g/dL — ABNORMAL HIGH (ref 12.0–15.0)
Immature Granulocytes: 1 %
Lymphocytes Relative: 3 %
Lymphs Abs: 0.9 10*3/uL (ref 0.7–4.0)
MCH: 31.7 pg (ref 26.0–34.0)
MCHC: 31.6 g/dL (ref 30.0–36.0)
MCV: 100.4 fL — ABNORMAL HIGH (ref 80.0–100.0)
Monocytes Absolute: 3.4 10*3/uL — ABNORMAL HIGH (ref 0.1–1.0)
Monocytes Relative: 10 %
Neutro Abs: 29.4 10*3/uL — ABNORMAL HIGH (ref 1.7–7.7)
Neutrophils Relative %: 86 %
Platelets: 323 10*3/uL (ref 150–400)
RBC: 5.26 MIL/uL — ABNORMAL HIGH (ref 3.87–5.11)
RDW: 13.2 % (ref 11.5–15.5)
Smear Review: ADEQUATE
WBC: 34.3 10*3/uL — ABNORMAL HIGH (ref 4.0–10.5)
nRBC: 0 % (ref 0.0–0.2)

## 2019-12-09 LAB — COMPREHENSIVE METABOLIC PANEL
ALT: 17 U/L (ref 0–44)
AST: 17 U/L (ref 15–41)
Albumin: 3.4 g/dL — ABNORMAL LOW (ref 3.5–5.0)
Alkaline Phosphatase: 74 U/L (ref 38–126)
Anion gap: 44 — ABNORMAL HIGH (ref 5–15)
BUN: 46 mg/dL — ABNORMAL HIGH (ref 6–20)
CO2: 7 mmol/L — ABNORMAL LOW (ref 22–32)
Calcium: 9.1 mg/dL (ref 8.9–10.3)
Chloride: 85 mmol/L — ABNORMAL LOW (ref 98–111)
Creatinine, Ser: 2.99 mg/dL — ABNORMAL HIGH (ref 0.44–1.00)
GFR calc Af Amer: 20 mL/min — ABNORMAL LOW (ref >60)
GFR calc non Af Amer: 17 mL/min — ABNORMAL LOW (ref >60)
Glucose, Bld: 1125 mg/dL (ref 70–99)
Potassium: 4 mmol/L (ref 3.5–5.1)
Sodium: 136 mmol/L (ref 135–145)
Total Bilirubin: 2.7 mg/dL — ABNORMAL HIGH (ref 0.3–1.2)
Total Protein: 6 g/dL — ABNORMAL LOW (ref 6.5–8.1)

## 2019-12-09 LAB — I-STAT ARTERIAL BLOOD GAS, ED
Acid-base deficit: 18 mmol/L — ABNORMAL HIGH (ref 0.0–2.0)
Bicarbonate: 7.3 mmol/L — ABNORMAL LOW (ref 20.0–28.0)
Calcium, Ion: 1.23 mmol/L (ref 1.15–1.40)
HCT: 51 % — ABNORMAL HIGH (ref 36.0–46.0)
Hemoglobin: 17.3 g/dL — ABNORMAL HIGH (ref 12.0–15.0)
O2 Saturation: 94 %
Patient temperature: 95.1
Potassium: 4.1 mmol/L (ref 3.5–5.1)
Sodium: 128 mmol/L — ABNORMAL LOW (ref 135–145)
TCO2: 8 mmol/L — ABNORMAL LOW (ref 22–32)
pCO2 arterial: 17.3 mmHg — CL (ref 32.0–48.0)
pH, Arterial: 7.223 — ABNORMAL LOW (ref 7.350–7.450)
pO2, Arterial: 74 mmHg — ABNORMAL LOW (ref 83.0–108.0)

## 2019-12-09 LAB — URINALYSIS, ROUTINE W REFLEX MICROSCOPIC
Bacteria, UA: NONE SEEN
Bilirubin Urine: NEGATIVE
Glucose, UA: >=500 mg/dL — AB
Ketones, ur: 80 mg/dL — AB
Leukocytes,Ua: NEGATIVE
Nitrite: NEGATIVE
Protein, ur: 30 mg/dL — AB
Specific Gravity, Urine: 1.015 (ref 1.005–1.030)
pH: 5 (ref 5.0–8.0)

## 2019-12-09 LAB — CBG MONITORING, ED: Glucose-Capillary: >600 mg/dL (ref 70–99)

## 2019-12-09 LAB — PROTIME-INR
INR: 1.1 (ref 0.8–1.2)
Prothrombin Time: 13.8 seconds (ref 11.4–15.2)

## 2019-12-09 LAB — AMMONIA: Ammonia: 28 umol/L (ref 9–35)

## 2019-12-09 LAB — I-STAT BETA HCG BLOOD, ED (MC, WL, AP ONLY): I-stat hCG, quantitative: 10.7 m[IU]/mL — ABNORMAL HIGH (ref <5)

## 2019-12-09 LAB — POC OCCULT BLOOD, ED: Fecal Occult Bld: NEGATIVE

## 2019-12-09 LAB — LACTIC ACID, PLASMA: Lactic Acid, Venous: 3 mmol/L (ref 0.5–1.9)

## 2019-12-09 LAB — APTT: aPTT: 24 seconds (ref 24–36)

## 2019-12-09 MED ORDER — DEXTROSE-NACL 5-0.45 % IV SOLN: INTRAVENOUS | Status: DC

## 2019-12-09 MED ORDER — METRONIDAZOLE IN NACL 5-0.79 MG/ML-% IV SOLN
500.0000 mg | Freq: Once | INTRAVENOUS | Status: AC
  Administered 2019-12-09: 500 mg via INTRAVENOUS
  Filled 2019-12-09: qty 100

## 2019-12-09 MED ORDER — LORAZEPAM 2 MG/ML IJ SOLN
0.5000 mg | Freq: Once | INTRAMUSCULAR | Status: AC
  Administered 2019-12-09: 0.5 mg via INTRAVENOUS

## 2019-12-09 MED ORDER — INSULIN REGULAR(HUMAN) IN NACL 100-0.9 UT/100ML-% IV SOLN
INTRAVENOUS | Status: DC
  Administered 2019-12-09: 7 [IU]/h via INTRAVENOUS
  Filled 2019-12-09: qty 100

## 2019-12-09 MED ORDER — DEXTROSE 50 % IV SOLN: 0.0000 mL | INTRAVENOUS | Status: DC | PRN

## 2019-12-09 MED ORDER — SODIUM CHLORIDE 0.9 % IV SOLN
2.0000 g | Freq: Once | INTRAVENOUS | Status: AC
  Administered 2019-12-09: 2 g via INTRAVENOUS
  Filled 2019-12-09: qty 2

## 2019-12-09 MED ORDER — VANCOMYCIN HCL IN DEXTROSE 1-5 GM/200ML-% IV SOLN
1000.0000 mg | Freq: Once | INTRAVENOUS | Status: AC
  Administered 2019-12-09: 1000 mg via INTRAVENOUS
  Filled 2019-12-09: qty 200

## 2019-12-09 MED ORDER — LACTATED RINGERS IV BOLUS (SEPSIS)
1000.0000 mL | Freq: Once | INTRAVENOUS | Status: AC
  Administered 2019-12-09: 1000 mL via INTRAVENOUS

## 2019-12-09 MED ORDER — LORAZEPAM 2 MG/ML IJ SOLN
INTRAMUSCULAR | Status: AC
  Filled 2019-12-09: qty 1

## 2019-12-09 MED ORDER — LACTATED RINGERS IV BOLUS
1000.0000 mL | INTRAVENOUS | Status: AC
  Administered 2019-12-09: 1000 mL via INTRAVENOUS

## 2019-12-09 MED ORDER — POTASSIUM CHLORIDE 10 MEQ/100ML IV SOLN
10.0000 meq | INTRAVENOUS | Status: AC
  Administered 2019-12-09 – 2019-12-10 (×2): 10 meq via INTRAVENOUS
  Filled 2019-12-09: qty 100

## 2019-12-09 MED ORDER — SODIUM CHLORIDE 0.9 % IV SOLN
2.0000 g | INTRAVENOUS | Status: DC
  Administered 2019-12-10: 2 g via INTRAVENOUS
  Filled 2019-12-09: qty 2

## 2019-12-09 MED ORDER — VANCOMYCIN HCL 500 MG IV SOLR
500.0000 mg | INTRAVENOUS | Status: DC
  Administered 2019-12-10: 500 mg via INTRAVENOUS
  Filled 2019-12-09 (×3): qty 500

## 2019-12-09 MED ORDER — LACTATED RINGERS IV BOLUS (SEPSIS)
500.0000 mL | Freq: Once | INTRAVENOUS | Status: AC
  Administered 2019-12-09: 500 mL via INTRAVENOUS

## 2019-12-09 MED ORDER — SODIUM CHLORIDE 0.9 % IV SOLN: INTRAVENOUS | Status: DC

## 2019-12-09 NOTE — Progress Notes (Signed)
Critical ABG results given to RN.  

## 2019-12-09 NOTE — ED Triage Notes (Signed)
Pt comes from home via [facility], found by son altered today, possible GI bleed, coffee ground emesis. Initial pressure 70/30

## 2019-12-09 NOTE — Progress Notes (Signed)
Pharmacy Antibiotic Note  Courtney Bennett is a 54 y.o. female admitted on 12/09/2019 with infection of unknown source.  Pharmacy has been consulted for Cefepime and vancomycin dosing.  Patient hypothermic in ED tonight with a temp of 35.1, wbc elevated at 34. Patient also with acute kidney injury and scr of 2.99. Glucose 1125.   Plan: Cefepime 2g q24 hours  Vancomycin 1g IV now then 500mg  IV q24 hours Follow renal function closely     Temp (24hrs), Avg:95.1 F (35.1 C), Min:95.1 F (35.1 C), Max:95.1 F (35.1 C)  Recent Labs  Lab 12/09/19 2120  WBC 34.3*  CREATININE 2.99*    CrCl cannot be calculated (Unknown ideal weight.).    Allergies  Allergen Reactions  . Metformin Diarrhea  . Sulfur   . Lamictal [Lamotrigine] Rash  . Latex Itching and Rash    Antimicrobials this admission: Vanc 7/10 >>  Cefepime 7/10 >>   Dose adjustments this admission:   Microbiology results: 7/10 BCx:  7/10 UCx:    Thank you for allowing pharmacy to be a part of this patient's care.  Erin Hearing PharmD., BCPS Clinical Pharmacist 12/09/2019 10:14 PM

## 2019-12-09 NOTE — ED Provider Notes (Addendum)
[facility] EMERGENCY DEPARTMENT Provider Note   CSN: [id number] Arrival date & time: 12/09/19  2101     History Chief Complaint  Patient presents with  . Altered Mental Status    Courtney Bennett is a 54 y.o. female.  HPI Level 5 caveat secondary to patient confusion    54 year old female history of diabetes and hypertension and bipolar disorder who presents today with altered mental status.  Per EMS report patient was not feeling well with abdominal pain yesterday.  She presented to another ED but left prior to evaluation.  Today her son found her on the floor with confusion.  She had what sounded like coffee-ground emesis.  EMS reports that on their initial evaluation her blood sugar was elevated and they rechecked it and it was 264.  Patient was hypotensive.  She was confused.  Patient received 1 L normal saline prehospital and has had some increase in her blood pressure with first blood pressure here being 94/65 Past Medical History:  Diagnosis Date  . Bipolar 1 disorder ([place])   . Diabetes mellitus   . Fibromyalgia   . Hypertension     Patient Active Problem List   Diagnosis Date Noted  . Hyperthyroidism 08/14/2019  . Other specified anxiety disorders 02/27/2019  . Type 2 diabetes mellitus with diabetic polyneuropathy, without long-term current use of insulin ([place]) 02/27/2019  . Atypical pneumonia 11/24/2014  . Acute respiratory failure ([place]) 11/24/2014  . Bipolar affective disorder, current episode hypomanic ([place]) 08/10/2014  . Paranoid ([place]) 08/10/2014    Past Surgical History:  Procedure Laterality Date  . ABDOMINAL HYSTERECTOMY  01/2019  . CESAREAN SECTION    . HERNIA REPAIR       OB History   No obstetric history on file.     Family History  Problem Relation Age of Onset  . Diabetes Father     Social History   Tobacco Use  . Smoking status: Former Smoker    Types: Cigarettes    Quit date: 11/21/2014    Years since quitting: 5.0    . Smokeless tobacco: Never Used  Substance Use Topics  . Alcohol use: Yes    Comment: rare  . Drug use: No    Home Medications Prior to Admission medications   Medication Sig Start Date End Date Taking? Authorizing Provider  atorvastatin (LIPITOR) 20 MG tablet Take 20 mg by mouth daily.    [provider]  Continuous Blood Gluc Sensor (FREESTYLE LIBRE 14 DAY SENSOR) MISC 1 application by Subdermal route See admin instructions. Check blood glucose 1-2 times daily. E11.65 Patient not taking: Reported on 08/14/2019 05/11/19   Ronnald Nian, DO  dapagliflozin propanediol (FARXIGA) 10 MG TABS tablet Take 10 mg by mouth daily before breakfast. 08/14/19   Renato Shin, MD  Dulaglutide (TRULICITY) 1.5 GY/6.5LD SOPN Inject 1.5 mg into the skin once a week. 08/14/19   Renato Shin, MD  DULoxetine (CYMBALTA) 60 MG capsule Take 1 capsule (60 mg total) by mouth daily. Patient not taking: Reported on 08/14/2019 05/12/19   Ronnald Nian, DO  estrogens, conjugated, (PREMARIN) 0.3 MG tablet Take 1 tablet (0.3 mg total) by mouth daily. Patient not taking: Reported on 08/14/2019 06/16/19   Letta Median K, DO  glipiZIDE (GLUCOTROL) 5 MG tablet Take 1 tablet (5 mg total) by mouth 2 (two) times daily before a meal. 08/14/19   Renato Shin, MD  glucose blood (ACCU-CHEK AVIVA PLUS) test strip 1 each by Other route daily.  And lancets 2/day 08/14/19   Renato Shin, MD  ketoconazole (NIZORAL) 2 % shampoo APPLY 1 APPLICATION TOPICALLY 2 (TWO) TIMES A WEEK. Patient not taking: Reported on 08/14/2019 07/13/19   Letta Median K, DO  lisinopril (ZESTRIL) 40 MG tablet Take 1 tablet by mouth daily. 02/27/19   [provider]  metoCLOPramide (REGLAN) 10 MG tablet Take 1 tablet (10 mg total) by mouth 4 (four) times daily -  before meals and at bedtime. Patient not taking: Reported on 08/14/2019 06/09/19   Ronnald Nian, DO  sertraline (ZOLOFT) 100 MG tablet Take 1.5 tablets (150 mg total) by  mouth daily. Patient not taking: Reported on 08/14/2019 05/05/19   Ronnald Nian, DO  valACYclovir (VALTREX) 500 MG tablet TAKE 1 TABLET (500 MG TOTAL) BY MOUTH 2 (TWO) TIMES DAILY FOR 30 DAYS. Patient not taking: Reported on 08/14/2019 08/09/19   Ronnald Nian, DO  venlafaxine XR (EFFEXOR-XR) 75 MG 24 hr capsule Take 1 capsule (75 mg total) by mouth daily with breakfast. Patient not taking: Reported on 08/14/2019 06/08/19   Ronnald Nian, DO    Allergies    Metformin, Sulfur, Lamictal [lamotrigine], and Latex  Review of Systems   Review of Systems  Constitutional: Positive for unexpected weight change.    Physical Exam Updated Vital Signs BP 94/65   Pulse (!) 109   Temp (!) 95.1 F (35.1 C) (Rectal)   Resp (!) 28   LMP 11/24/2014   SpO2 95%   Physical Exam Vitals and nursing note reviewed.  Constitutional:      General: She is in acute distress.     Appearance: She is ill-appearing.  HENT:     Head: Normocephalic and atraumatic.     Right Ear: External ear normal.     Left Ear: External ear normal.     Nose: Nose normal.     Mouth/Throat:     Mouth: Mucous membranes are dry.  Eyes:     Extraocular Movements: Extraocular movements intact.     Pupils: Pupils are equal, round, and reactive to light.  Cardiovascular:     Rate and Rhythm: Tachycardia present. Rhythm irregular.     Pulses: Normal pulses.  Pulmonary:     Breath sounds: Rhonchi present.  Abdominal:     General: Abdomen is flat.     Palpations: Abdomen is soft.  Genitourinary:    Rectum: Normal.  Musculoskeletal:     Cervical back: Normal range of motion.  Skin:    Capillary Refill: Capillary refill takes less than 2 seconds.     Comments: Skin is cool with some peripheral mottling  Neurological:     Comments: Patient appears acutely ill.  She does tend to mumble her name.  I do not note any lateralized deficits.     ED Results / Procedures / Treatments   Labs (all labs ordered are  listed, but only abnormal results are displayed) Labs Reviewed  CBC WITH DIFFERENTIAL/PLATELET - Abnormal; Notable for the following components:      Result Value   WBC 34.3 (*)    RBC 5.26 (*)    Hemoglobin 16.7 (*)    HCT 52.8 (*)    MCV 100.4 (*)    All other components within normal limits  I-STAT ARTERIAL BLOOD GAS, ED - Abnormal; Notable for the following components:   pH, Arterial 7.223 (*)    pCO2 arterial 17.3 (*)    pO2, Arterial 74 (*)    Bicarbonate 7.3 (*)  TCO2 8 (*)    Acid-base deficit 18.0 (*)    Sodium 128 (*)    HCT 51.0 (*)    Hemoglobin 17.3 (*)    All other components within normal limits  I-STAT BETA HCG BLOOD, ED ([facility], [facility], [facility] ONLY) - Abnormal; Notable for the following components:   I-stat hCG, quantitative 10.7 (*)    All other components within normal limits  CULTURE, BLOOD (ROUTINE X 2)  CULTURE, BLOOD (ROUTINE X 2)  URINE CULTURE  APTT  PROTIME-INR  LACTIC ACID, PLASMA  LACTIC ACID, PLASMA  COMPREHENSIVE METABOLIC PANEL  URINALYSIS, ROUTINE W REFLEX MICROSCOPIC  AMMONIA  OCCULT BLOOD X 1 CARD TO LAB, STOOL  PATHOLOGIST SMEAR REVIEW  POC OCCULT BLOOD, ED    EKG EKG Interpretation  Date/Time:  Saturday December 09 2019 21:07:26 EDT Ventricular Rate:  144 PR Interval:    QRS Duration: 84 QT Interval:  305 QTC Calculation: 440 R Axis:   78 Text Interpretation: Atrial flutter Probable LVH with secondary repol abnrm ST depr, consider ischemia, inferior leads Confirmed by Pattricia Boss [phone]) on 12/09/2019 9:56:06 PM   Radiology No results found.  Procedures .Critical Care Performed by: Pattricia Boss, MD Authorized by: Pattricia Boss, MD   Critical care provider statement:    Critical care time (minutes):  70   Critical care end time:  12/09/2019 11:49 PM   Critical care was necessary to treat or prevent imminent or life-threatening deterioration of the following conditions:  Sepsis, shock, renal failure and CNS failure or compromise    Critical care was time spent personally by me on the following activities:  Discussions with consultants, evaluation of patient's response to treatment, examination of patient, ordering and performing treatments and interventions, ordering and review of laboratory studies, ordering and review of radiographic studies, pulse oximetry, re-evaluation of patient's condition, obtaining history from patient or surrogate and review of old charts   (including critical care time)  Medications Ordered in ED Medications  lactated ringers bolus 1,000 mL (1,000 mLs Intravenous New Bag/Given 12/09/19 2128)    And  lactated ringers bolus 500 mL (500 mLs Intravenous New Bag/Given 12/09/19 2134)  ceFEPIme (MAXIPIME) 2 g in sodium chloride 0.9 % 100 mL IVPB (2 g Intravenous New Bag/Given 12/09/19 2131)  metroNIDAZOLE (FLAGYL) IVPB 500 mg (500 mg Intravenous New Bag/Given 12/09/19 2129)  vancomycin (VANCOCIN) IVPB 1000 mg/200 mL premix (1,000 mg Intravenous New Bag/Given 12/09/19 2136)    ED Course  I have reviewed the triage vital signs and the nursing notes.  Pertinent labs & imaging results that were available during my care of the patient were reviewed by me and considered in my medical decision making (see chart for details).  Clinical Course as of Dec 08 2240  Sat Dec 09, 2019  2229 IV fluids infused HR decreased to 109 BP increased   Glucose(!!): 1,125 [DR]    Clinical Course User Index [DR] Pattricia Boss, MD   MDM Rules/Calculators/A&P                          54 yo female ho of t2dm presents today with ams.  Patient has reportedly had abdominal pain and vomiting.  Here she is acidotic with bs 1125.  She was initially hypotensive, hypothermic and tachycardic.  She is not given any 30 cc/kg fluid bolus.  Heart rate has decreased from 1 50-1 09.  She is being warmed with the bear hugger.  She has received  broad-spectrum antibiotics.  Blood pressure is increased to 94/65.  She has become more alert but  is agitated. 1-altered mental status- ct pending, patient with hyperglycemia and acidosis.No report of trauma, no blood thinner use 2- hyperglycemia- dka vs hyperosmolar-suspect the latter.IV fluids and insulin ordered per protocol. 3- sepsis?- fluids and broad spectrum abx initiated 4- new renal failure Discussed with critical care and will see for admission CT head and abdomen are pending.  Dr. Corinna Capra aware and will follow up results Final Clinical Impression(s) / ED Diagnoses Final diagnoses:  Altered mental status, unspecified altered mental status type  Hyperglycemic crisis due to diabetes mellitus ([place])  AKI (acute kidney injury) ([place])  Sepsis with acute organ dysfunction, due to unspecified organism, unspecified type, unspecified whether septic shock present [facility])    Rx / DC Orders ED Discharge Orders    None       Pattricia Boss, MD 12/09/19 2350    Pattricia Boss, MD 12/09/19 2354

## 2019-12-10 ENCOUNTER — Inpatient Hospital Stay (HOSPITAL_COMMUNITY): Payer: Medicare Other

## 2019-12-10 DIAGNOSIS — E860 Dehydration: Secondary | ICD-10-CM | POA: Diagnosis not present

## 2019-12-10 DIAGNOSIS — M797 Fibromyalgia: Secondary | ICD-10-CM | POA: Diagnosis present

## 2019-12-10 DIAGNOSIS — J690 Pneumonitis due to inhalation of food and vomit: Secondary | ICD-10-CM | POA: Diagnosis present

## 2019-12-10 DIAGNOSIS — E1110 Type 2 diabetes mellitus with ketoacidosis without coma: Secondary | ICD-10-CM | POA: Diagnosis present

## 2019-12-10 DIAGNOSIS — R443 Hallucinations, unspecified: Secondary | ICD-10-CM | POA: Diagnosis present

## 2019-12-10 DIAGNOSIS — R6520 Severe sepsis without septic shock: Secondary | ICD-10-CM

## 2019-12-10 DIAGNOSIS — I10 Essential (primary) hypertension: Secondary | ICD-10-CM | POA: Diagnosis present

## 2019-12-10 DIAGNOSIS — I361 Nonrheumatic tricuspid (valve) insufficiency: Secondary | ICD-10-CM | POA: Diagnosis not present

## 2019-12-10 DIAGNOSIS — E042 Nontoxic multinodular goiter: Secondary | ICD-10-CM | POA: Diagnosis present

## 2019-12-10 DIAGNOSIS — R1310 Dysphagia, unspecified: Secondary | ICD-10-CM | POA: Diagnosis present

## 2019-12-10 DIAGNOSIS — T17908A Unspecified foreign body in respiratory tract, part unspecified causing other injury, initial encounter: Secondary | ICD-10-CM | POA: Insufficient documentation

## 2019-12-10 DIAGNOSIS — E876 Hypokalemia: Secondary | ICD-10-CM | POA: Diagnosis present

## 2019-12-10 DIAGNOSIS — R4182 Altered mental status, unspecified: Secondary | ICD-10-CM | POA: Diagnosis present

## 2019-12-10 DIAGNOSIS — L89156 Pressure-induced deep tissue damage of sacral region: Secondary | ICD-10-CM | POA: Diagnosis present

## 2019-12-10 DIAGNOSIS — F310 Bipolar disorder, current episode hypomanic: Secondary | ICD-10-CM

## 2019-12-10 DIAGNOSIS — R410 Disorientation, unspecified: Secondary | ICD-10-CM

## 2019-12-10 DIAGNOSIS — E1140 Type 2 diabetes mellitus with diabetic neuropathy, unspecified: Secondary | ICD-10-CM | POA: Diagnosis present

## 2019-12-10 DIAGNOSIS — I9589 Other hypotension: Secondary | ICD-10-CM | POA: Diagnosis not present

## 2019-12-10 DIAGNOSIS — Z20822 Contact with and (suspected) exposure to covid-19: Secondary | ICD-10-CM | POA: Diagnosis present

## 2019-12-10 DIAGNOSIS — F319 Bipolar disorder, unspecified: Secondary | ICD-10-CM | POA: Diagnosis present

## 2019-12-10 DIAGNOSIS — K922 Gastrointestinal hemorrhage, unspecified: Secondary | ICD-10-CM | POA: Diagnosis present

## 2019-12-10 DIAGNOSIS — R34 Anuria and oliguria: Secondary | ICD-10-CM | POA: Diagnosis not present

## 2019-12-10 DIAGNOSIS — K5900 Constipation, unspecified: Secondary | ICD-10-CM | POA: Diagnosis not present

## 2019-12-10 DIAGNOSIS — G9341 Metabolic encephalopathy: Secondary | ICD-10-CM | POA: Diagnosis present

## 2019-12-10 DIAGNOSIS — N179 Acute kidney failure, unspecified: Secondary | ICD-10-CM | POA: Diagnosis present

## 2019-12-10 DIAGNOSIS — E349 Endocrine disorder, unspecified: Secondary | ICD-10-CM

## 2019-12-10 DIAGNOSIS — R6521 Severe sepsis with septic shock: Secondary | ICD-10-CM | POA: Diagnosis present

## 2019-12-10 DIAGNOSIS — T4275XA Adverse effect of unspecified antiepileptic and sedative-hypnotic drugs, initial encounter: Secondary | ICD-10-CM | POA: Diagnosis not present

## 2019-12-10 DIAGNOSIS — K920 Hematemesis: Secondary | ICD-10-CM | POA: Diagnosis not present

## 2019-12-10 DIAGNOSIS — A419 Sepsis, unspecified organism: Secondary | ICD-10-CM | POA: Diagnosis not present

## 2019-12-10 DIAGNOSIS — B49 Unspecified mycosis: Secondary | ICD-10-CM | POA: Diagnosis not present

## 2019-12-10 DIAGNOSIS — D72829 Elevated white blood cell count, unspecified: Secondary | ICD-10-CM | POA: Diagnosis present

## 2019-12-10 DIAGNOSIS — I4892 Unspecified atrial flutter: Secondary | ICD-10-CM | POA: Diagnosis present

## 2019-12-10 DIAGNOSIS — Z90710 Acquired absence of both cervix and uterus: Secondary | ICD-10-CM | POA: Diagnosis not present

## 2019-12-10 DIAGNOSIS — J9601 Acute respiratory failure with hypoxia: Secondary | ICD-10-CM | POA: Diagnosis present

## 2019-12-10 DIAGNOSIS — R7989 Other specified abnormal findings of blood chemistry: Secondary | ICD-10-CM | POA: Diagnosis present

## 2019-12-10 DIAGNOSIS — I480 Paroxysmal atrial fibrillation: Secondary | ICD-10-CM | POA: Diagnosis not present

## 2019-12-10 DIAGNOSIS — B377 Candidal sepsis: Secondary | ICD-10-CM | POA: Diagnosis present

## 2019-12-10 LAB — GLUCOSE, CAPILLARY
Glucose-Capillary: 544 mg/dL (ref 70–99)
Glucose-Capillary: 411 mg/dL — ABNORMAL HIGH (ref 70–99)
Glucose-Capillary: 450 mg/dL — ABNORMAL HIGH (ref 70–99)
Glucose-Capillary: 411 mg/dL — ABNORMAL HIGH (ref 70–99)
Glucose-Capillary: 384 mg/dL — ABNORMAL HIGH (ref 70–99)
Glucose-Capillary: 303 mg/dL — ABNORMAL HIGH (ref 70–99)
Glucose-Capillary: 245 mg/dL — ABNORMAL HIGH (ref 70–99)
Glucose-Capillary: 239 mg/dL — ABNORMAL HIGH (ref 70–99)
Glucose-Capillary: 224 mg/dL — ABNORMAL HIGH (ref 70–99)
Glucose-Capillary: 239 mg/dL — ABNORMAL HIGH (ref 70–99)
Glucose-Capillary: 244 mg/dL — ABNORMAL HIGH (ref 70–99)
Glucose-Capillary: 218 mg/dL — ABNORMAL HIGH (ref 70–99)
Glucose-Capillary: 281 mg/dL — ABNORMAL HIGH (ref 70–99)
Glucose-Capillary: 247 mg/dL — ABNORMAL HIGH (ref 70–99)
Glucose-Capillary: 207 mg/dL — ABNORMAL HIGH (ref 70–99)
Glucose-Capillary: 174 mg/dL — ABNORMAL HIGH (ref 70–99)
Glucose-Capillary: 158 mg/dL — ABNORMAL HIGH (ref 70–99)
Glucose-Capillary: 161 mg/dL — ABNORMAL HIGH (ref 70–99)
Glucose-Capillary: 166 mg/dL — ABNORMAL HIGH (ref 70–99)
Glucose-Capillary: 141 mg/dL — ABNORMAL HIGH (ref 70–99)

## 2019-12-10 LAB — POCT I-STAT 7, (LYTES, BLD GAS, ICA,H+H)
Acid-base deficit: 4 mmol/L — ABNORMAL HIGH (ref 0.0–2.0)
Acid-base deficit: 1 mmol/L (ref 0.0–2.0)
Bicarbonate: 23.7 mmol/L (ref 20.0–28.0)
Bicarbonate: 24.5 mmol/L (ref 20.0–28.0)
Calcium, Ion: 1.47 mmol/L — ABNORMAL HIGH (ref 1.15–1.40)
Calcium, Ion: 1.47 mmol/L — ABNORMAL HIGH (ref 1.15–1.40)
HCT: 43 % (ref 36.0–46.0)
HCT: 39 % (ref 36.0–46.0)
Hemoglobin: 14.6 g/dL (ref 12.0–15.0)
Hemoglobin: 13.3 g/dL (ref 12.0–15.0)
O2 Saturation: 100 %
O2 Saturation: 94 %
Patient temperature: 98.2
Patient temperature: 99.5
Potassium: 3.9 mmol/L (ref 3.5–5.1)
Potassium: 3.5 mmol/L (ref 3.5–5.1)
Sodium: 142 mmol/L (ref 135–145)
Sodium: 147 mmol/L — ABNORMAL HIGH (ref 135–145)
TCO2: 25 mmol/L (ref 22–32)
TCO2: 26 mmol/L (ref 22–32)
pCO2 arterial: 51.9 mmHg — ABNORMAL HIGH (ref 32.0–48.0)
pCO2 arterial: 45.2 mmHg (ref 32.0–48.0)
pH, Arterial: 7.267 — ABNORMAL LOW (ref 7.350–7.450)
pH, Arterial: 7.345 — ABNORMAL LOW (ref 7.350–7.450)
pO2, Arterial: 387 mmHg — ABNORMAL HIGH (ref 83.0–108.0)
pO2, Arterial: 76 mmHg — ABNORMAL LOW (ref 83.0–108.0)

## 2019-12-10 LAB — BASIC METABOLIC PANEL
Anion gap: 22 — ABNORMAL HIGH (ref 5–15)
Anion gap: 9 (ref 5–15)
Anion gap: 7 (ref 5–15)
Anion gap: 8 (ref 5–15)
Anion gap: 5 (ref 5–15)
BUN: 45 mg/dL — ABNORMAL HIGH (ref 6–20)
BUN: 42 mg/dL — ABNORMAL HIGH (ref 6–20)
BUN: 33 mg/dL — ABNORMAL HIGH (ref 6–20)
BUN: 33 mg/dL — ABNORMAL HIGH (ref 6–20)
BUN: 28 mg/dL — ABNORMAL HIGH (ref 6–20)
CO2: 17 mmol/L — ABNORMAL LOW (ref 22–32)
CO2: 26 mmol/L (ref 22–32)
CO2: 23 mmol/L (ref 22–32)
CO2: 25 mmol/L (ref 22–32)
CO2: 27 mmol/L (ref 22–32)
Calcium: 10.1 mg/dL (ref 8.9–10.3)
Calcium: 9.6 mg/dL (ref 8.9–10.3)
Calcium: 7.9 mg/dL — ABNORMAL LOW (ref 8.9–10.3)
Calcium: 8.9 mg/dL (ref 8.9–10.3)
Calcium: 8.7 mg/dL — ABNORMAL LOW (ref 8.9–10.3)
Chloride: 103 mmol/L (ref 98–111)
Chloride: 109 mmol/L (ref 98–111)
Chloride: 113 mmol/L — ABNORMAL HIGH (ref 98–111)
Chloride: 109 mmol/L (ref 98–111)
Chloride: 110 mmol/L (ref 98–111)
Creatinine, Ser: 1.96 mg/dL — ABNORMAL HIGH (ref 0.44–1.00)
Creatinine, Ser: 1.17 mg/dL — ABNORMAL HIGH (ref 0.44–1.00)
Creatinine, Ser: 0.8 mg/dL (ref 0.44–1.00)
Creatinine, Ser: 0.91 mg/dL (ref 0.44–1.00)
Creatinine, Ser: 0.68 mg/dL (ref 0.44–1.00)
GFR calc Af Amer: 33 mL/min — ABNORMAL LOW (ref >60)
GFR calc Af Amer: >60 mL/min (ref >60)
GFR calc Af Amer: >60 mL/min (ref >60)
GFR calc Af Amer: >60 mL/min (ref >60)
GFR calc Af Amer: >60 mL/min (ref >60)
GFR calc non Af Amer: 28 mL/min — ABNORMAL LOW (ref >60)
GFR calc non Af Amer: 53 mL/min — ABNORMAL LOW (ref >60)
GFR calc non Af Amer: >60 mL/min (ref >60)
GFR calc non Af Amer: >60 mL/min (ref >60)
GFR calc non Af Amer: >60 mL/min (ref >60)
Glucose, Bld: 572 mg/dL (ref 70–99)
Glucose, Bld: 307 mg/dL — ABNORMAL HIGH (ref 70–99)
Glucose, Bld: 262 mg/dL — ABNORMAL HIGH (ref 70–99)
Glucose, Bld: 288 mg/dL — ABNORMAL HIGH (ref 70–99)
Glucose, Bld: 190 mg/dL — ABNORMAL HIGH (ref 70–99)
Potassium: 4.1 mmol/L (ref 3.5–5.1)
Potassium: 4.1 mmol/L (ref 3.5–5.1)
Potassium: 3.4 mmol/L — ABNORMAL LOW (ref 3.5–5.1)
Potassium: 3 mmol/L — ABNORMAL LOW (ref 3.5–5.1)
Potassium: 3.1 mmol/L — ABNORMAL LOW (ref 3.5–5.1)
Sodium: 142 mmol/L (ref 135–145)
Sodium: 144 mmol/L (ref 135–145)
Sodium: 143 mmol/L (ref 135–145)
Sodium: 142 mmol/L (ref 135–145)
Sodium: 142 mmol/L (ref 135–145)

## 2019-12-10 LAB — CBC WITH DIFFERENTIAL/PLATELET
Abs Immature Granulocytes: 0.19 10*3/uL — ABNORMAL HIGH (ref 0.00–0.07)
Basophils Absolute: 0.1 10*3/uL (ref 0.0–0.1)
Basophils Relative: 1 %
Eosinophils Absolute: 0 10*3/uL (ref 0.0–0.5)
Eosinophils Relative: 0 %
HCT: 47.3 % — ABNORMAL HIGH (ref 36.0–46.0)
Hemoglobin: 16 g/dL — ABNORMAL HIGH (ref 12.0–15.0)
Immature Granulocytes: 1 %
Lymphocytes Relative: 5 %
Lymphs Abs: 1.5 10*3/uL (ref 0.7–4.0)
MCH: 32.3 pg (ref 26.0–34.0)
MCHC: 33.8 g/dL (ref 30.0–36.0)
MCV: 95.4 fL (ref 80.0–100.0)
Monocytes Absolute: 1.3 10*3/uL — ABNORMAL HIGH (ref 0.1–1.0)
Monocytes Relative: 4 %
Neutro Abs: 27.8 10*3/uL — ABNORMAL HIGH (ref 1.7–7.7)
Neutrophils Relative %: 89 %
Platelets: 256 10*3/uL (ref 150–400)
RBC: 4.96 MIL/uL (ref 3.87–5.11)
RDW: 12.9 % (ref 11.5–15.5)
WBC: 31 10*3/uL — ABNORMAL HIGH (ref 4.0–10.5)
nRBC: 0 % (ref 0.0–0.2)

## 2019-12-10 LAB — CBC
HCT: 48 % — ABNORMAL HIGH (ref 36.0–46.0)
HCT: 36 % (ref 36.0–46.0)
Hemoglobin: 16.4 g/dL — ABNORMAL HIGH (ref 12.0–15.0)
Hemoglobin: 12.3 g/dL (ref 12.0–15.0)
MCH: 31.3 pg (ref 26.0–34.0)
MCH: 31.2 pg (ref 26.0–34.0)
MCHC: 34.2 g/dL (ref 30.0–36.0)
MCHC: 34.2 g/dL (ref 30.0–36.0)
MCV: 91.6 fL (ref 80.0–100.0)
MCV: 91.4 fL (ref 80.0–100.0)
Platelets: 271 10*3/uL (ref 150–400)
Platelets: 131 10*3/uL — ABNORMAL LOW (ref 150–400)
RBC: 5.24 MIL/uL — ABNORMAL HIGH (ref 3.87–5.11)
RBC: 3.94 MIL/uL (ref 3.87–5.11)
RDW: 12.6 % (ref 11.5–15.5)
RDW: 12.8 % (ref 11.5–15.5)
WBC: 29.8 10*3/uL — ABNORMAL HIGH (ref 4.0–10.5)
WBC: 11.2 10*3/uL — ABNORMAL HIGH (ref 4.0–10.5)
nRBC: 0 % (ref 0.0–0.2)
nRBC: 0 % (ref 0.0–0.2)

## 2019-12-10 LAB — LIPASE, BLOOD: Lipase: 903 U/L — ABNORMAL HIGH (ref 11–51)

## 2019-12-10 LAB — I-STAT ARTERIAL BLOOD GAS, ED
Acid-base deficit: 8 mmol/L — ABNORMAL HIGH (ref 0.0–2.0)
Bicarbonate: 15.8 mmol/L — ABNORMAL LOW (ref 20.0–28.0)
Calcium, Ion: 1.44 mmol/L — ABNORMAL HIGH (ref 1.15–1.40)
HCT: 47 % — ABNORMAL HIGH (ref 36.0–46.0)
Hemoglobin: 16 g/dL — ABNORMAL HIGH (ref 12.0–15.0)
O2 Saturation: 93 %
Patient temperature: 98.5
Potassium: 4.1 mmol/L (ref 3.5–5.1)
Sodium: 141 mmol/L (ref 135–145)
TCO2: 17 mmol/L — ABNORMAL LOW (ref 22–32)
pCO2 arterial: 29.4 mmHg — ABNORMAL LOW (ref 32.0–48.0)
pH, Arterial: 7.339 — ABNORMAL LOW (ref 7.350–7.450)
pO2, Arterial: 69 mmHg — ABNORMAL LOW (ref 83.0–108.0)

## 2019-12-10 LAB — LACTIC ACID, PLASMA
Lactic Acid, Venous: 1.9 mmol/L (ref 0.5–1.9)
Lactic Acid, Venous: 2 mmol/L (ref 0.5–1.9)

## 2019-12-10 LAB — OCCULT BLOOD GASTRIC / DUODENUM (SPECIMEN CUP): Occult Blood, Gastric: POSITIVE — AB

## 2019-12-10 LAB — ABO/RH: ABO/RH(D): A POS

## 2019-12-10 LAB — TYPE AND SCREEN
ABO/RH(D): A POS
Antibody Screen: NEGATIVE

## 2019-12-10 LAB — PROCALCITONIN: Procalcitonin: 0.52 ng/mL

## 2019-12-10 LAB — MAGNESIUM
Magnesium: 1.9 mg/dL (ref 1.7–2.4)
Magnesium: 1.4 mg/dL — ABNORMAL LOW (ref 1.7–2.4)

## 2019-12-10 LAB — HEMOGLOBIN AND HEMATOCRIT, BLOOD
HCT: 26.8 % — ABNORMAL LOW (ref 36.0–46.0)
HCT: 36.1 % (ref 36.0–46.0)
Hemoglobin: 9 g/dL — ABNORMAL LOW (ref 12.0–15.0)
Hemoglobin: 12.4 g/dL (ref 12.0–15.0)

## 2019-12-10 LAB — SARS CORONAVIRUS 2 BY RT PCR (HOSPITAL ORDER, PERFORMED IN ~~LOC~~ HOSPITAL LAB): SARS Coronavirus 2: NEGATIVE

## 2019-12-10 LAB — BETA-HYDROXYBUTYRIC ACID
Beta-Hydroxybutyric Acid: 7.63 mmol/L — ABNORMAL HIGH (ref 0.05–0.27)
Beta-Hydroxybutyric Acid: 0.32 mmol/L — ABNORMAL HIGH (ref 0.05–0.27)
Beta-Hydroxybutyric Acid: 0.39 mmol/L — ABNORMAL HIGH (ref 0.05–0.27)

## 2019-12-10 LAB — TRIGLYCERIDES: Triglycerides: 154 mg/dL — ABNORMAL HIGH (ref <150)

## 2019-12-10 LAB — CBG MONITORING, ED
Glucose-Capillary: >600 mg/dL (ref 70–99)
Glucose-Capillary: >600 mg/dL (ref 70–99)

## 2019-12-10 LAB — OSMOLALITY: Osmolality: 364 mOsm/kg (ref 275–295)

## 2019-12-10 LAB — AMYLASE: Amylase: 774 U/L — ABNORMAL HIGH (ref 28–100)

## 2019-12-10 LAB — MRSA PCR SCREENING: MRSA by PCR: POSITIVE — AB

## 2019-12-10 LAB — TROPONIN I (HIGH SENSITIVITY): Troponin I (High Sensitivity): 180 ng/L (ref <18)

## 2019-12-10 LAB — PHOSPHORUS: Phosphorus: 1.5 mg/dL — ABNORMAL LOW (ref 2.5–4.6)

## 2019-12-10 MED ORDER — MIDAZOLAM HCL 2 MG/2ML IJ SOLN
2.0000 mg | INTRAMUSCULAR | Status: DC | PRN
  Administered 2019-12-10 (×2): 2 mg via INTRAVENOUS
  Filled 2019-12-10 (×2): qty 2

## 2019-12-10 MED ORDER — VASOPRESSIN 20 UNITS/100 ML INFUSION FOR SHOCK
0.0000 [IU]/min | INTRAVENOUS | Status: DC
  Administered 2019-12-10: 0.03 [IU]/min via INTRAVENOUS
  Filled 2019-12-10 (×2): qty 100

## 2019-12-10 MED ORDER — MIDAZOLAM HCL 2 MG/2ML IJ SOLN
INTRAMUSCULAR | Status: AC
  Administered 2019-12-10: 1 mg via INTRAVENOUS
  Filled 2019-12-10: qty 2

## 2019-12-10 MED ORDER — SODIUM CHLORIDE 0.9 % IV SOLN: 2.0000 g | Freq: Two times a day (BID) | INTRAVENOUS | Status: DC

## 2019-12-10 MED ORDER — NOREPINEPHRINE 4 MG/250ML-% IV SOLN
INTRAVENOUS | Status: AC
  Administered 2019-12-10: 10 ug/min via INTRAVENOUS
  Filled 2019-12-10: qty 250

## 2019-12-10 MED ORDER — DEXTROSE 50 % IV SOLN: 0.0000 mL | INTRAVENOUS | Status: DC | PRN

## 2019-12-10 MED ORDER — FENTANYL CITRATE (PF) 100 MCG/2ML IJ SOLN
INTRAMUSCULAR | Status: AC
  Filled 2019-12-10: qty 2

## 2019-12-10 MED ORDER — ALBUTEROL SULFATE (2.5 MG/3ML) 0.083% IN NEBU
2.5000 mg | INHALATION_SOLUTION | Freq: Four times a day (QID) | RESPIRATORY_TRACT | Status: DC
  Administered 2019-12-10 – 2019-12-11 (×7): 2.5 mg via RESPIRATORY_TRACT
  Filled 2019-12-10 (×7): qty 3

## 2019-12-10 MED ORDER — MIDAZOLAM HCL 2 MG/2ML IJ SOLN
2.0000 mg | INTRAMUSCULAR | Status: DC | PRN
  Administered 2019-12-10: 2 mg via INTRAVENOUS
  Filled 2019-12-10: qty 2

## 2019-12-10 MED ORDER — POTASSIUM CHLORIDE 10 MEQ/50ML IV SOLN
10.0000 meq | INTRAVENOUS | Status: AC
  Administered 2019-12-10 – 2019-12-11 (×4): 10 meq via INTRAVENOUS
  Filled 2019-12-10 (×4): qty 50

## 2019-12-10 MED ORDER — FENTANYL 2500MCG IN NS 250ML (10MCG/ML) PREMIX INFUSION
50.0000 ug/h | INTRAVENOUS | Status: DC
  Administered 2019-12-10: 150 ug/h via INTRAVENOUS
  Administered 2019-12-10: 50 ug/h via INTRAVENOUS
  Administered 2019-12-10: 150 ug/h via INTRAVENOUS
  Filled 2019-12-10 (×2): qty 250

## 2019-12-10 MED ORDER — POLYETHYLENE GLYCOL 3350 17 G PO PACK
17.0000 g | PACK | Freq: Every day | ORAL | Status: DC
  Administered 2019-12-10 – 2019-12-15 (×3): 17 g via ORAL
  Filled 2019-12-10 (×3): qty 1

## 2019-12-10 MED ORDER — ALBUTEROL SULFATE (2.5 MG/3ML) 0.083% IN NEBU: 2.5000 mg | INHALATION_SOLUTION | RESPIRATORY_TRACT | Status: DC | PRN

## 2019-12-10 MED ORDER — CHLORHEXIDINE GLUCONATE CLOTH 2 % EX PADS
6.0000 | MEDICATED_PAD | Freq: Every day | CUTANEOUS | Status: DC
  Administered 2019-12-11 – 2019-12-15 (×6): 6 via TOPICAL

## 2019-12-10 MED ORDER — POLYETHYLENE GLYCOL 3350 17 G PO PACK: 17.0000 g | PACK | Freq: Every day | ORAL | Status: DC | PRN

## 2019-12-10 MED ORDER — INSULIN ASPART 100 UNIT/ML ~~LOC~~ SOLN
2.0000 [IU] | SUBCUTANEOUS | Status: DC
  Administered 2019-12-11 (×2): 2 [IU] via SUBCUTANEOUS
  Administered 2019-12-11: 4 [IU] via SUBCUTANEOUS

## 2019-12-10 MED ORDER — DOCUSATE SODIUM 50 MG/5ML PO LIQD
100.0000 mg | Freq: Two times a day (BID) | ORAL | Status: DC
  Administered 2019-12-10 (×2): 100 mg via ORAL
  Filled 2019-12-10 (×2): qty 10

## 2019-12-10 MED ORDER — POTASSIUM CHLORIDE 20 MEQ/15ML (10%) PO SOLN
20.0000 meq | ORAL | Status: AC
  Administered 2019-12-10 – 2019-12-11 (×2): 20 meq
  Filled 2019-12-10 (×2): qty 15

## 2019-12-10 MED ORDER — FENTANYL BOLUS VIA INFUSION
50.0000 ug | INTRAVENOUS | Status: DC | PRN
  Filled 2019-12-10: qty 50

## 2019-12-10 MED ORDER — FENTANYL CITRATE (PF) 100 MCG/2ML IJ SOLN
50.0000 ug | Freq: Once | INTRAMUSCULAR | Status: AC
  Administered 2019-12-10: 50 ug via INTRAVENOUS

## 2019-12-10 MED ORDER — INSULIN DETEMIR 100 UNIT/ML ~~LOC~~ SOLN
22.0000 [IU] | Freq: Two times a day (BID) | SUBCUTANEOUS | Status: DC
  Administered 2019-12-10 – 2019-12-11 (×2): 22 [IU] via SUBCUTANEOUS
  Filled 2019-12-10 (×4): qty 0.22

## 2019-12-10 MED ORDER — DOCUSATE SODIUM 50 MG/5ML PO LIQD
100.0000 mg | Freq: Two times a day (BID) | ORAL | Status: DC
  Administered 2019-12-11: 100 mg
  Filled 2019-12-10 (×2): qty 10

## 2019-12-10 MED ORDER — SODIUM CHLORIDE 0.9 % IV SOLN: INTRAVENOUS | Status: DC

## 2019-12-10 MED ORDER — LACTATED RINGERS IV BOLUS
1000.0000 mL | Freq: Once | INTRAVENOUS | Status: AC
  Administered 2019-12-10: 1000 mL via INTRAVENOUS

## 2019-12-10 MED ORDER — ORAL CARE MOUTH RINSE
15.0000 mL | OROMUCOSAL | Status: DC
  Administered 2019-12-10 – 2019-12-11 (×10): 15 mL via OROMUCOSAL

## 2019-12-10 MED ORDER — SODIUM PHOSPHATES 45 MMOLE/15ML IV SOLN
30.0000 mmol | Freq: Once | INTRAVENOUS | Status: AC
  Administered 2019-12-10: 30 mmol via INTRAVENOUS
  Filled 2019-12-10: qty 10

## 2019-12-10 MED ORDER — ROCURONIUM BROMIDE 50 MG/5ML IV SOLN
50.0000 mg | Freq: Once | INTRAVENOUS | Status: AC
  Administered 2019-12-10: 50 mg via INTRAVENOUS
  Filled 2019-12-10: qty 5

## 2019-12-10 MED ORDER — SODIUM CHLORIDE 0.9 % IV SOLN: 250.0000 mL | INTRAVENOUS | Status: DC

## 2019-12-10 MED ORDER — POTASSIUM CHLORIDE IN NACL 20-0.45 MEQ/L-% IV SOLN
INTRAVENOUS | Status: DC
  Filled 2019-12-10 (×3): qty 1000

## 2019-12-10 MED ORDER — CHLORHEXIDINE GLUCONATE 0.12% ORAL RINSE (MEDLINE KIT)
15.0000 mL | Freq: Two times a day (BID) | OROMUCOSAL | Status: DC
  Administered 2019-12-10 – 2019-12-15 (×8): 15 mL via OROMUCOSAL

## 2019-12-10 MED ORDER — INSULIN REGULAR(HUMAN) IN NACL 100-0.9 UT/100ML-% IV SOLN
INTRAVENOUS | Status: DC
  Administered 2019-12-10: 6 [IU]/h via INTRAVENOUS
  Filled 2019-12-10 (×2): qty 100

## 2019-12-10 MED ORDER — SODIUM CHLORIDE 0.9 % IV BOLUS
1000.0000 mL | Freq: Once | INTRAVENOUS | Status: AC
  Administered 2019-12-10: 1000 mL via INTRAVENOUS

## 2019-12-10 MED ORDER — PANTOPRAZOLE SODIUM 40 MG IV SOLR
40.0000 mg | Freq: Two times a day (BID) | INTRAVENOUS | Status: DC
  Administered 2019-12-10 – 2019-12-14 (×9): 40 mg via INTRAVENOUS
  Filled 2019-12-10 (×9): qty 40

## 2019-12-10 MED ORDER — NOREPINEPHRINE 4 MG/250ML-% IV SOLN
0.0000 ug/min | INTRAVENOUS | Status: DC
  Administered 2019-12-10: 20 ug/min via INTRAVENOUS
  Administered 2019-12-10: 5 ug/min via INTRAVENOUS
  Administered 2019-12-10: 10 ug/min via INTRAVENOUS
  Administered 2019-12-11: 7 ug/min via INTRAVENOUS
  Filled 2019-12-10 (×3): qty 250

## 2019-12-10 MED ORDER — DEXTROSE-NACL 5-0.45 % IV SOLN: INTRAVENOUS | Status: DC

## 2019-12-10 MED ORDER — LACTATED RINGERS IV BOLUS
2000.0000 mL | Freq: Once | INTRAVENOUS | Status: AC
  Administered 2019-12-10: 2000 mL via INTRAVENOUS

## 2019-12-10 MED ORDER — PROPOFOL 1000 MG/100ML IV EMUL
5.0000 ug/kg/min | INTRAVENOUS | Status: DC
  Administered 2019-12-10: 15 ug/kg/min via INTRAVENOUS
  Administered 2019-12-10: 10 ug/kg/min via INTRAVENOUS
  Filled 2019-12-10 (×2): qty 100

## 2019-12-10 MED ORDER — MIDAZOLAM HCL 2 MG/2ML IJ SOLN: 1.0000 mg | Freq: Once | INTRAMUSCULAR | Status: AC

## 2019-12-10 MED ORDER — IPRATROPIUM-ALBUTEROL 0.5-2.5 (3) MG/3ML IN SOLN
3.0000 mL | Freq: Four times a day (QID) | RESPIRATORY_TRACT | Status: DC
  Filled 2019-12-10: qty 3

## 2019-12-10 MED ORDER — ALBUTEROL SULFATE (2.5 MG/3ML) 0.083% IN NEBU
2.5000 mg | INHALATION_SOLUTION | RESPIRATORY_TRACT | Status: DC | PRN
  Administered 2019-12-10: 2.5 mg via RESPIRATORY_TRACT
  Filled 2019-12-10: qty 3

## 2019-12-10 MED ORDER — ETOMIDATE 2 MG/ML IV SOLN
20.0000 mg | Freq: Once | INTRAVENOUS | Status: AC
  Administered 2019-12-10: 20 mg via INTRAVENOUS

## 2019-12-10 MED ORDER — DOCUSATE SODIUM 100 MG PO CAPS: 100.0000 mg | ORAL_CAPSULE | Freq: Two times a day (BID) | ORAL | Status: DC | PRN

## 2019-12-10 NOTE — Progress Notes (Signed)
[place] Progress Note Patient Name: Courtney Bennett DOB: 04-29-1966 MRN: [id number]   Date of Service  12/10/2019  HPI/Events of Note  64 F history of DM (HbA1C 11) presented with altered sensorium and found to be in DKA with glucose > 1000, CO2 7, AG 44. She has been given fluids and started on insulin drip. She appears to have lost significant weight.  eICU Interventions  Called to camera in due to coffee ground emesis and desaturation. Bedside CCM team informed and patient is intubated for airway protection.     Intervention Category Major Interventions: Acid-Base disturbance - evaluation and management;Electrolyte abnormality - evaluation and management;Respiratory failure - evaluation and management;Hypoxemia - evaluation and management;Hypovolemia - evaluation and treatment with fluids;Hyperglycemia - active titration of insulin therapy;Change in mental status - evaluation and management Evaluation Type: New Patient Evaluation  Judd Lien 12/10/2019, 3:21 AM

## 2019-12-10 NOTE — Procedures (Signed)
Intubation Procedure Note  Courtney Bennett  [id number]  [birth year]  Date:12/10/19  Time:3:46 AM   Provider Performing:Alysa Duca R Melana Hingle    Procedure: Intubation (31500)  Indication(s) Respiratory Failure  Consent Unable to obtain consent due to emergent nature of procedure.   Anesthesia Etomidate, Versed and Rocuronium   Time Out Verified patient identification, verified procedure, site/side was marked, verified correct patient position, special equipment/implants available, medications/allergies/relevant history reviewed, required imaging and test results available.   Sterile Technique Usual hand hygeine, masks, and gloves were used   Procedure Description Patient positioned in bed supine.  Sedation given as noted above.  Patient was intubated with endotracheal tube using Glidescope.  View was Grade 1 full glottis .  Number of attempts was 2.  Colorimetric CO2 detector was consistent with tracheal placement.   Complications/Tolerance transient hypotension and hypoxia Chest X-ray is ordered to verify placement.   EBL Minimal   Specimen(s) None  Dr. Carson Myrtle present at the bedside during the entire procedure.   Otilio Carpen Raesha Coonrod, PA-C

## 2019-12-10 NOTE — Progress Notes (Signed)
Seen/examined. Still looks dry. Give more LR Trend H/H and usual DKA indices Broad spectrum abx  Erskine Emery MD PCCM

## 2019-12-10 NOTE — Progress Notes (Signed)
Through secure chat with recognition of note, bedside RN made aware of need for follow up lactic

## 2019-12-10 NOTE — Progress Notes (Signed)
[place] Progress Note Patient Name: [name] DOB: [birth year] MRN: [id number]   Date of Service  12/10/2019  HPI/Events of Note  K+ 3.1  eICU Interventions  Elink electrolyte replacement protocol for K+ ordered.        Kerry Kass [name] 12/10/2019, 9:55 PM

## 2019-12-10 NOTE — Progress Notes (Signed)
eLink Physician-Brief Progress Note Patient Name: Courtney Bennett DOB: [birth year] MRN: [id number]   Date of Service  12/10/2019  HPI/Events of Note  Patient needs transition orders from Insulin infusion to sq insulin. Levophed ceiling increased since patient now has a central line.  eICU Interventions  Transition orders entered, Levophed ceiling increased.        Frederik Pear 12/10/2019, 8:53 PM

## 2019-12-10 NOTE — Procedures (Signed)
Central Venous Catheter Insertion Procedure Note  Courtney Bennett  [id number]  1966/05/10  Date:12/10/19  Time:4:54 AM   Provider Performing:Brenen Beigel R Jaziya Obarr   Procedure: Insertion of Non-tunneled Central Venous Catheter(36556) with US guidance (65784)   Indication(s) Medication administration and Difficult access  Consent Unable to obtain consent due to emergent nature of procedure.  Anesthesia Topical only with 1% lidocaine   Timeout Verified patient identification, verified procedure, site/side was marked, verified correct patient position, special equipment/implants available, medications/allergies/relevant history reviewed, required imaging and test results available.  Sterile Technique Maximal sterile technique including full sterile barrier drape, hand hygiene, sterile gown, sterile gloves, mask, hair covering, sterile ultrasound probe cover (if used).  Procedure Description Area of catheter insertion was cleaned with chlorhexidine and draped in sterile fashion.  With real-time ultrasound guidance a central venous catheter was placed into the left internal jugular vein. Nonpulsatile blood flow and easy flushing noted in all ports.  The catheter was sutured in place and sterile dressing applied.  Complications/Tolerance None; patient tolerated the procedure well. Chest X-ray is ordered to verify placement for internal jugular or subclavian cannulation.     EBL Minimal  Specimen(s) None  Courtney Carpen Jamani Bearce, PA-C

## 2019-12-10 NOTE — H&P (Signed)
NAME:  Courtney Bennett, MRN:  [id number], DOB:  December 04, 1965, LOS: 0 ADMISSION DATE:  12/09/2019, CONSULTATION DATE:  12/10/19 REFERRING MD:  EDP, CHIEF COMPLAINT:  AMS   Brief History   54 y.o. F with PMH of DM, Bipolar Disorder and HTN who was found by family altered with coffee ground emesis.  Found to be in DKA with continued confusion. PCCM consulted for admission.  History of present illness   Courtney Bennett is a 54 y.o. F with PMH of poorly controlled DM, possible multinodular goiter, Bipolar Disorder and HTN who was found by family altered on the floor with dark emesis.  Per EMS report patient had abdominal pain yesterday and had presented to another ED, but left prior to evaluation.    On arrival to the ED, patient was confused and agitated and glucose >1100 with anion gap 44 and bicarb 7 .   Work-up significant for leukocytosis of 34k, creatinine 2.9, lactic acid 3.0, pH 7.2.  CT abdomen/pelvis significant for distended stomach, CT head without acute findings.   She was started on an insulin gtt, given 3L LR, and started on Vancomycin/Cefepime.  Serum Hcg was slightly elevated, however pt is s/p hysterectomy   She remained extremely agitated and was given Ativan with subsequent somnolence and PCCM consulted for admission  Past Medical History   has a past medical history of Bipolar 1 disorder ([place]), Diabetes mellitus, Fibromyalgia, and Hypertension.  Significant Hospital Events   12/10/19 Admit to PCCM  Consults:    Procedures:    Significant Diagnostic Tests:  7/10 CXR>>no acute findings 7/10 CT head>>no acute findings 7/10 CT abd/pelvis>>Mild patchy areas of bibasilar atelectasis and/or infiltrate, Marked severity gastric distension. Sequelae associated with gastric outlet obstruction cannot be excluded.  Micro Data:  7/10 BCx2>> 7/10 UC>> 7/11 Sars-CoV-2>>  Antimicrobials:  Vancomycin 7/10- Cefepime  7/10-  Interim history/subjective:  Pt had a repeated episode of  coffee ground emesis, hemodynamically stable  Objective   Blood pressure (!) 142/84, pulse (!) 121, temperature (!) 95.1 F (35.1 C), temperature source Rectal, resp. rate (!) 21, last menstrual period 11/24/2014, SpO2 93 %.        Intake/Output Summary (Last 24 hours) at 12/10/2019 0037 Last data filed at 12/10/2019 0007 Gross per 24 hour  Intake 2000 ml  Output --  Net 2000 ml   There were no vitals filed for this visit.  General:  Thin, poorly nourished F, somnolent HEENT: MM pink/moist Neuro: sleeping, opens eyes to voice, protecting her airway, not following commands CV: s1s2 tachycardic, regular, no m/r/g PULM:  Course breath sounds bilaterally, improved after suctioning, requiring 5L [place] oxygen GI: soft, mildly distended bsx4  Extremities: warm/dry, no edema  Skin: no rashes or lesions   Resolved Hospital Problem list     Assessment & Plan:    Poorly controlled IDDM with DKA  Last A1c 7 months ago over 10.0, ABG shows pH 7.2/17.3/74/8 -continue IVF and insulin gtt per Endotool, transition to 1/2 NS to address free water deficit of 4L in the setting of corrected sodium of 160 -follow q4hr BMP, monitor K and gap  -Check Lipase  Severe Sepsis  Likely secondary to aspiration with lactic acid 3.0 -received 30cc/kg IVF and broad spectrium abx -follow repeat lactic acid, continue Vanc/Cefepime and check procalcitonin -no pressor requirement at the time of admission  Likely UGIB with gastric distension Coffee ground emesis noted in the ED -Gastroccult and start PPI -place NG tube -Type and screen, may need  GI consult once stabilized  Encephalopathy Initially agitated but following commands per ED, somnolent after Ativan -likely metabolic with possible underlying psychiatic component, previous admissions noted with hallucinations and diagnosis of bipolar 1  -head CT is negative -Check UDS  AKI -Likely secondary to pre-renal volume depletion in the setting of  DKA -continue volume repletion and follow renal indices and UOP    Atrial flutter Noted on admission EKG, converted to sinus tachycardia in the ED, likely secondary to DKA and dehydration -check troponin and magnesium level   Elevated Hcg Serum Hcg was ordered in the ED and is minimally elevated at 10, pt is s/p hysterectomy.  This could possibly be secondary to HRT or ? Marijuana, if rising consider reproductive Ca -check repeat Hcg and UDS    Possible Multi-nodular Goiter -with normal T4 levels, outpatient NM thyroid scan pending       Best practice:  Diet: NPO Pain/Anxiety/Delirium protocol (if indicated): n/a VAP protocol (if indicated): n/a DVT prophylaxis: SCD's GI prophylaxis: protonix Glucose control: insulin infusion Mobility: bed rest Code Status: full code Family Communication: attempted to reach pt's daughter without answer Disposition: ICU  Labs   CBC: Recent Labs  Lab 12/09/19 2120 12/09/19 2127  WBC 34.3*  --   NEUTROABS 29.4*  --   HGB 16.7* 17.3*  HCT 52.8* 51.0*  MCV 100.4*  --   PLT 323  --     Basic Metabolic Panel: Recent Labs  Lab 12/09/19 2120 12/09/19 2127  NA 136 128*  K 4.0 4.1  CL 85*  --   CO2 7*  --   GLUCOSE 1,125*  --   BUN 46*  --   CREATININE 2.99*  --   CALCIUM 9.1  --    GFR: CrCl cannot be calculated (Unknown ideal weight.). Recent Labs  Lab 12/09/19 2120  WBC 34.3*  LATICACIDVEN 3.0*    Liver Function Tests: Recent Labs  Lab 12/09/19 2120  AST 17  ALT 17  ALKPHOS 74  BILITOT 2.7*  PROT 6.0*  ALBUMIN 3.4*   No results for input(s): LIPASE, AMYLASE in the last 168 hours. Recent Labs  Lab 12/09/19 2120  AMMONIA 28    ABG    Component Value Date/Time   PHART 7.223 (L) 12/09/2019 2127   PCO2ART 17.3 (LL) 12/09/2019 2127   PO2ART 74 (L) 12/09/2019 2127   HCO3 7.3 (L) 12/09/2019 2127   TCO2 8 (L) 12/09/2019 2127   ACIDBASEDEF 18.0 (H) 12/09/2019 2127   O2SAT 94.0 12/09/2019 2127      Coagulation Profile: Recent Labs  Lab 12/09/19 2120  INR 1.1    Cardiac Enzymes: No results for input(s): CKTOTAL, CKMB, CKMBINDEX, TROPONINI in the last 168 hours.  HbA1C: Hemoglobin A1C  Date/Time Value Ref Range Status  08/14/2019 02:36 PM 11.1 (A) 4.0 - 5.6 % Final   Hgb A1c MFr Bld  Date/Time Value Ref Range Status  05/02/2019 02:24 PM 10.5 (H) 4.8 - 5.6 % Final    Comment:             Prediabetes: 5.7 - 6.4          Diabetes: >6.4          Glycemic control for adults with diabetes: <7.0     CBG: Recent Labs  Lab 12/09/19 2256 12/10/19 0005  GLUCAP >600* >600*    Review of Systems:   Unable to obtain secondary to mental status  Past Medical History  She,  has a past medical history  of Bipolar 1 disorder ([place]), Diabetes mellitus, Fibromyalgia, and Hypertension.   Surgical History    Past Surgical History:  Procedure Laterality Date  . ABDOMINAL HYSTERECTOMY  01/2019  . CESAREAN SECTION    . HERNIA REPAIR       Social History   reports that she quit smoking about 5 years ago. Her smoking use included cigarettes. She has never used smokeless tobacco. She reports current alcohol use. She reports that she does not use drugs.   Family History   Her family history includes Diabetes in her father.   Allergies Allergies  Allergen Reactions  . Metformin Diarrhea  . Sulfur   . Lamictal [Lamotrigine] Rash  . Latex Itching and Rash     Home Medications  Prior to Admission medications   Medication Sig Start Date End Date Taking? Authorizing Provider  atorvastatin (LIPITOR) 20 MG tablet Take 20 mg by mouth daily.    [provider]  Continuous Blood Gluc Sensor (FREESTYLE LIBRE 14 DAY SENSOR) MISC 1 application by Subdermal route See admin instructions. Check blood glucose 1-2 times daily. E11.65 Patient not taking: Reported on 08/14/2019 05/11/19   Ronnald Nian, DO  dapagliflozin propanediol (FARXIGA) 10 MG TABS tablet Take 10 mg by mouth  daily before breakfast. 08/14/19   Renato Shin, MD  Dulaglutide (TRULICITY) 1.5 ZO/1.0RU SOPN Inject 1.5 mg into the skin once a week. 08/14/19   Renato Shin, MD  DULoxetine (CYMBALTA) 60 MG capsule Take 1 capsule (60 mg total) by mouth daily. Patient not taking: Reported on 08/14/2019 05/12/19   Ronnald Nian, DO  estrogens, conjugated, (PREMARIN) 0.3 MG tablet Take 1 tablet (0.3 mg total) by mouth daily. Patient not taking: Reported on 08/14/2019 06/16/19   Letta Median K, DO  glipiZIDE (GLUCOTROL) 5 MG tablet Take 1 tablet (5 mg total) by mouth 2 (two) times daily before a meal. 08/14/19   Renato Shin, MD  glucose blood (ACCU-CHEK AVIVA PLUS) test strip 1 each by Other route daily. And lancets 2/day 08/14/19   Renato Shin, MD  ketoconazole (NIZORAL) 2 % shampoo APPLY 1 APPLICATION TOPICALLY 2 (TWO) TIMES A WEEK. Patient not taking: Reported on 08/14/2019 07/13/19   Letta Median K, DO  lisinopril (ZESTRIL) 40 MG tablet Take 1 tablet by mouth daily. 02/27/19   [provider]  metoCLOPramide (REGLAN) 10 MG tablet Take 1 tablet (10 mg total) by mouth 4 (four) times daily -  before meals and at bedtime. Patient not taking: Reported on 08/14/2019 06/09/19   Ronnald Nian, DO  sertraline (ZOLOFT) 100 MG tablet Take 1.5 tablets (150 mg total) by mouth daily. Patient not taking: Reported on 08/14/2019 05/05/19   Ronnald Nian, DO  valACYclovir (VALTREX) 500 MG tablet TAKE 1 TABLET (500 MG TOTAL) BY MOUTH 2 (TWO) TIMES DAILY FOR 30 DAYS. Patient not taking: Reported on 08/14/2019 08/09/19   Ronnald Nian, DO  venlafaxine XR (EFFEXOR-XR) 75 MG 24 hr capsule Take 1 capsule (75 mg total) by mouth daily with breakfast. Patient not taking: Reported on 08/14/2019 06/08/19   Ronnald Nian, DO     Critical care time: 55 minutes     CRITICAL CARE Performed by: Otilio Carpen Robertine Kipper   Total critical care time: 55 minutes  Critical care time was exclusive of separately billable  procedures and treating other patients.  Critical care was necessary to treat or prevent imminent or life-threatening deterioration.  Critical care was time spent personally by me on the following  activities: development of treatment plan with patient and/or surrogate as well as nursing, discussions with consultants, evaluation of patient's response to treatment, examination of patient, obtaining history from patient or surrogate, ordering and performing treatments and interventions, ordering and review of laboratory studies, ordering and review of radiographic studies, pulse oximetry and re-evaluation of patient's condition.  Otilio Carpen Ailsa Mireles, PA-C

## 2019-12-10 NOTE — Progress Notes (Signed)
H/H down a fair bit 13>>9. Did have coffee ground emesis but also had 2L+ crystalloid this AM, will check full CBC, type/screen, and start PPI.  Transfusion threshold HgB 7. No ongoing output from OGT, stuff in container is dark mostly bilious,. Still think this is mostly hypovolemia but will watch closely.

## 2019-12-11 LAB — GLUCOSE, CAPILLARY
Glucose-Capillary: 100 mg/dL — ABNORMAL HIGH (ref 70–99)
Glucose-Capillary: 101 mg/dL — ABNORMAL HIGH (ref 70–99)
Glucose-Capillary: 101 mg/dL — ABNORMAL HIGH (ref 70–99)
Glucose-Capillary: 105 mg/dL — ABNORMAL HIGH (ref 70–99)
Glucose-Capillary: 129 mg/dL — ABNORMAL HIGH (ref 70–99)
Glucose-Capillary: 131 mg/dL — ABNORMAL HIGH (ref 70–99)
Glucose-Capillary: 136 mg/dL — ABNORMAL HIGH (ref 70–99)
Glucose-Capillary: 164 mg/dL — ABNORMAL HIGH (ref 70–99)
Glucose-Capillary: 47 mg/dL — ABNORMAL LOW (ref 70–99)
Glucose-Capillary: 52 mg/dL — ABNORMAL LOW (ref 70–99)
Glucose-Capillary: 58 mg/dL — ABNORMAL LOW (ref 70–99)
Glucose-Capillary: >600 mg/dL (ref 70–99)

## 2019-12-11 LAB — BASIC METABOLIC PANEL
Anion gap: 5 (ref 5–15)
BUN: 20 mg/dL (ref 6–20)
CO2: 25 mmol/L (ref 22–32)
Calcium: 8.7 mg/dL — ABNORMAL LOW (ref 8.9–10.3)
Chloride: 112 mmol/L — ABNORMAL HIGH (ref 98–111)
Creatinine, Ser: 0.57 mg/dL (ref 0.44–1.00)
GFR calc Af Amer: >60 mL/min (ref >60)
GFR calc non Af Amer: >60 mL/min (ref >60)
Glucose, Bld: 170 mg/dL — ABNORMAL HIGH (ref 70–99)
Potassium: 3.6 mmol/L (ref 3.5–5.1)
Sodium: 142 mmol/L (ref 135–145)

## 2019-12-11 LAB — BLOOD CULTURE ID PANEL (REFLEXED)
Acinetobacter baumannii: NOT DETECTED
Candida albicans: DETECTED — AB
Candida glabrata: NOT DETECTED
Candida krusei: NOT DETECTED
Candida parapsilosis: NOT DETECTED
Candida tropicalis: NOT DETECTED
Enterobacter cloacae complex: NOT DETECTED
Enterobacteriaceae species: NOT DETECTED
Enterococcus species: NOT DETECTED
Escherichia coli: NOT DETECTED
Haemophilus influenzae: NOT DETECTED
Klebsiella oxytoca: NOT DETECTED
Klebsiella pneumoniae: NOT DETECTED
Listeria monocytogenes: NOT DETECTED
Neisseria meningitidis: NOT DETECTED
Proteus species: NOT DETECTED
Pseudomonas aeruginosa: NOT DETECTED
Serratia marcescens: NOT DETECTED
Staphylococcus aureus (BCID): NOT DETECTED
Staphylococcus species: NOT DETECTED
Streptococcus agalactiae: NOT DETECTED
Streptococcus pneumoniae: NOT DETECTED
Streptococcus pyogenes: NOT DETECTED
Streptococcus species: NOT DETECTED

## 2019-12-11 LAB — URINE CULTURE: Culture: NO GROWTH

## 2019-12-11 LAB — HEMOGLOBIN AND HEMATOCRIT, BLOOD
HCT: 34.5 % — ABNORMAL LOW (ref 36.0–46.0)
HCT: 34.5 % — ABNORMAL LOW (ref 36.0–46.0)
HCT: 32.3 % — ABNORMAL LOW (ref 36.0–46.0)
Hemoglobin: 11.8 g/dL — ABNORMAL LOW (ref 12.0–15.0)
Hemoglobin: 11.4 g/dL — ABNORMAL LOW (ref 12.0–15.0)
Hemoglobin: 10.6 g/dL — ABNORMAL LOW (ref 12.0–15.0)

## 2019-12-11 LAB — LACTIC ACID, PLASMA: Lactic Acid, Venous: 1.4 mmol/L (ref 0.5–1.9)

## 2019-12-11 LAB — PHOSPHORUS
Phosphorus: 1.3 mg/dL — ABNORMAL LOW (ref 2.5–4.6)
Phosphorus: 4 mg/dL (ref 2.5–4.6)

## 2019-12-11 LAB — MAGNESIUM: Magnesium: 1.4 mg/dL — ABNORMAL LOW (ref 1.7–2.4)

## 2019-12-11 MED ORDER — DEXTROSE 10 % IV SOLN: INTRAVENOUS | Status: DC

## 2019-12-11 MED ORDER — HYDROCORTISONE NA SUCCINATE PF 100 MG IJ SOLR
50.0000 mg | Freq: Four times a day (QID) | INTRAMUSCULAR | Status: AC
  Administered 2019-12-11 – 2019-12-12 (×4): 50 mg via INTRAVENOUS
  Filled 2019-12-11 (×4): qty 2

## 2019-12-11 MED ORDER — BISACODYL 10 MG RE SUPP: 10.0000 mg | Freq: Every day | RECTAL | Status: DC | PRN

## 2019-12-11 MED ORDER — DEXTROSE 50 % IV SOLN: 25.0000 g | INTRAVENOUS | Status: AC

## 2019-12-11 MED ORDER — POTASSIUM PHOSPHATES 15 MMOLE/5ML IV SOLN
45.0000 mmol | Freq: Once | INTRAVENOUS | Status: AC
  Administered 2019-12-11: 45 mmol via INTRAVENOUS
  Filled 2019-12-11: qty 15

## 2019-12-11 MED ORDER — SODIUM CHLORIDE 0.9 % IV SOLN
2.0000 g | Freq: Three times a day (TID) | INTRAVENOUS | Status: DC
  Administered 2019-12-11 – 2019-12-12 (×3): 2 g via INTRAVENOUS
  Filled 2019-12-11 (×3): qty 2

## 2019-12-11 MED ORDER — DEXTROSE 50 % IV SOLN: 1.0000 | Freq: Once | INTRAVENOUS | Status: AC

## 2019-12-11 MED ORDER — NALOXONE HCL 0.4 MG/ML IJ SOLN: 0.4000 mg | Freq: Once | INTRAMUSCULAR | Status: AC

## 2019-12-11 MED ORDER — SODIUM CHLORIDE 0.9% FLUSH
10.0000 mL | Freq: Two times a day (BID) | INTRAVENOUS | Status: DC
  Administered 2019-12-11 – 2019-12-15 (×9): 10 mL

## 2019-12-11 MED ORDER — OXYCODONE HCL 5 MG PO TABS
5.0000 mg | ORAL_TABLET | ORAL | Status: DC | PRN
  Administered 2019-12-13: 5 mg via ORAL
  Filled 2019-12-11: qty 1

## 2019-12-11 MED ORDER — NOREPINEPHRINE 4 MG/250ML-% IV SOLN
0.0000 ug/min | INTRAVENOUS | Status: DC
  Administered 2019-12-11: 2 ug/min via INTRAVENOUS
  Filled 2019-12-11: qty 250

## 2019-12-11 MED ORDER — VANCOMYCIN HCL 750 MG/150ML IV SOLN
750.0000 mg | Freq: Two times a day (BID) | INTRAVENOUS | Status: DC
  Administered 2019-12-11: 750 mg via INTRAVENOUS
  Filled 2019-12-11 (×2): qty 150

## 2019-12-11 MED ORDER — DEXTROSE 50 % IV SOLN
INTRAVENOUS | Status: AC
  Administered 2019-12-11: 25 g via INTRAVENOUS
  Filled 2019-12-11: qty 50

## 2019-12-11 MED ORDER — CHLORHEXIDINE GLUCONATE CLOTH 2 % EX PADS: 6.0000 | MEDICATED_PAD | Freq: Every day | CUTANEOUS | Status: DC

## 2019-12-11 MED ORDER — INSULIN DETEMIR 100 UNIT/ML ~~LOC~~ SOLN
15.0000 [IU] | Freq: Two times a day (BID) | SUBCUTANEOUS | Status: DC
  Filled 2019-12-11: qty 0.15

## 2019-12-11 MED ORDER — MAGNESIUM SULFATE 4 GM/100ML IV SOLN
4.0000 g | Freq: Once | INTRAVENOUS | Status: AC
  Administered 2019-12-11: 4 g via INTRAVENOUS
  Filled 2019-12-11: qty 100

## 2019-12-11 MED ORDER — VANCOMYCIN HCL 500 MG IV SOLR
500.0000 mg | Freq: Two times a day (BID) | INTRAVENOUS | Status: DC
  Filled 2019-12-11 (×3): qty 500

## 2019-12-11 MED ORDER — MUPIROCIN 2 % EX OINT
1.0000 "application " | TOPICAL_OINTMENT | Freq: Two times a day (BID) | CUTANEOUS | Status: DC
  Administered 2019-12-11 – 2019-12-15 (×9): 1 via NASAL
  Filled 2019-12-11 (×4): qty 22

## 2019-12-11 MED ORDER — DEXTROSE 50 % IV SOLN
INTRAVENOUS | Status: AC
  Administered 2019-12-11: 50 mL via INTRAVENOUS
  Filled 2019-12-11: qty 50

## 2019-12-11 MED ORDER — DEXMEDETOMIDINE HCL IN NACL 400 MCG/100ML IV SOLN
0.4000 ug/kg/h | INTRAVENOUS | Status: DC
  Administered 2019-12-11 – 2019-12-12 (×2): 0.4 ug/kg/h via INTRAVENOUS
  Filled 2019-12-11 (×2): qty 100

## 2019-12-11 MED ORDER — VANCOMYCIN HCL 500 MG/100ML IV SOLN
500.0000 mg | Freq: Two times a day (BID) | INTRAVENOUS | Status: DC
  Administered 2019-12-11: 500 mg via INTRAVENOUS
  Filled 2019-12-11 (×2): qty 100

## 2019-12-11 MED ORDER — NALOXONE HCL 0.4 MG/ML IJ SOLN
INTRAMUSCULAR | Status: AC
  Administered 2019-12-11: 0.4 mg via INTRAVENOUS
  Filled 2019-12-11: qty 1

## 2019-12-11 MED ORDER — SODIUM CHLORIDE 0.9% FLUSH: 10.0000 mL | INTRAVENOUS | Status: DC | PRN

## 2019-12-11 NOTE — Progress Notes (Signed)
Pharmacy Antibiotic Note  Courtney Bennett is a 54 y.o. female admitted on 12/09/2019 with r/o sepsis.  Pharmacy has been consulted for Vancomycin + Cefepime dosing.  The patient was noted to have DKA and AKI on admission - now resolving with SCr down to 0.57. Will adjust antibiotic doses today.   Plan: - Adjust Cefepime to 2g/8h - Adjust Vancomycin to 750 mg/12h - Will continue to follow renal function, culture results, LOT, and antibiotic de-escalation plans   Height: 5\' 4"  (162.6 cm) Weight: 52.7 kg (116 lb 2.9 oz) IBW/kg (Calculated) : 54.7  Temp (24hrs), Avg:99.5 F (37.5 C), Min:98.2 F (36.8 C), Max:100.5 F (38.1 C)  Recent Labs  Lab 12/09/19 2120 12/09/19 2120 12/10/19 0023 12/10/19 0251 12/10/19 0251 12/10/19 0757 12/10/19 1307 12/10/19 1518 12/10/19 2014 12/11/19 0628  WBC 34.3*  --  31.0* 29.8*  --   --   --  11.2*  --   --   CREATININE 2.99*   < >  --  1.96*   < > 1.17* 0.80 0.91 0.68 0.57  LATICACIDVEN 3.0*  --  1.9 2.0*  --   --   --   --   --   --    < > = values in this interval not displayed.    Estimated Creatinine Clearance: 67.7 mL/min (by C-G formula based on SCr of 0.57 mg/dL).    Allergies  Allergen Reactions  . Metformin Diarrhea  . Lamictal [Lamotrigine] Rash  . Latex Itching and Rash  . Sulfa Antibiotics Rash  . Sulfur Hives, Rash and Other (See Comments)    "Burns the skin," also  . Tape Rash and Other (See Comments)    Irritates the skin    Antimicrobials this admission: Vanc 7/10>> Cefep 7/10>> Flagyl 7/10>>  Dose adjustments this admission: n/a  Microbiology results: 7/10 BCx >> ng<24h 7/10 UCx >> NG 7/11 COVID >> neg 7/11 MRSA PCR >> positive  Thank you for allowing pharmacy to be a part of this patient's care.  Alycia Rossetti, PharmD, BCPS Clinical Pharmacist Clinical phone for 12/11/2019: [phone] 12/11/2019 10:47 AM   **Pharmacist phone directory can now be found on [email or web address] ([email or web address]).  Listed under [place].

## 2019-12-11 NOTE — Progress Notes (Signed)
[facility] Progress Note Patient Name: RIATA IKEDA DOB: 1965/12/12 MRN: [id number]   Date of Service  12/11/2019  HPI/Events of Note  Pt needs a BMET.  eICU Interventions  BMET ordered.        Kerry Kass Johathon Overturf 12/11/2019, 6:27 AM

## 2019-12-11 NOTE — Progress Notes (Signed)
Pt awakes when name spoken. Pt placed on SBT CPAP/PS. No effort made, pt remained apneic. Pt returned to full support at this time. RT will attempt at a later time. Will continue to monitor.

## 2019-12-11 NOTE — Progress Notes (Signed)
[facility] Progress Note Patient Name: Courtney Bennett DOB: September 22, 1965 MRN: [id number]   Date of Service  12/11/2019  HPI/Events of Note  Constipation   eICU Interventions  Plan: 1. Dulcolax 10 mg suppository Q day PRN constipation.      Intervention Category Major Interventions: Other:  Lysle Dingwall 12/11/2019, 9:11 PM

## 2019-12-11 NOTE — Progress Notes (Addendum)
NAME:  Courtney Bennett, MRN:  [id number], DOB:  05/11/1966, LOS: 1 ADMISSION DATE:  12/09/2019, CONSULTATION DATE:  12/11/19 REFERRING MD:  EDP, CHIEF COMPLAINT:  AMS   Brief History   54 y.o. F with PMH of DM, Bipolar Disorder and HTN who was found by family altered with coffee ground emesis.  Found to be in DKA with continued confusion. PCCM consulted for admission.  History of present illness   Courtney Bennett is a 54 y.o. F with PMH of poorly controlled DM, possible multinodular goiter, Bipolar Disorder and HTN who was found by family altered on the floor with dark emesis.  Per EMS report patient had abdominal pain yesterday and had presented to another ED, but left prior to evaluation.    On arrival to the ED, patient was confused and agitated and glucose >1100 with anion gap 44 and bicarb 7 .   Work-up significant for leukocytosis of 34k, creatinine 2.9, lactic acid 3.0, pH 7.2.  CT abdomen/pelvis significant for distended stomach, CT head without acute findings.   She was started on an insulin gtt, given 3L LR, and started on Vancomycin/Cefepime.  Serum Hcg was slightly elevated, however pt is s/p hysterectomy   She remained extremely agitated and was given Ativan with subsequent somnolence and PCCM consulted for admission  Past Medical History   has a past medical history of Bipolar 1 disorder ([place]), Diabetes mellitus, Fibromyalgia, and Hypertension.  Significant Hospital Events   12/11/19 Admit to PCCM  Consults:    Procedures:    Significant Diagnostic Tests:  7/10 CXR>>no acute findings 7/10 CT head>>no acute findings 7/10 CT abd/pelvis>>Mild patchy areas of bibasilar atelectasis and/or infiltrate, Marked severity gastric distension. Sequelae associated with gastric outlet obstruction cannot be excluded.  Micro Data:  7/10 BCx2>>NGTD  7/10 UC>>NGTD 7/11 Sars-CoV-2>> neg  7/10 MRSA swap positive   Antimicrobials:  Vancomycin 7/10- Cefepime  7/10-  Interim  history/subjective:   Very agitiated off sedation. But tolerating SAT SBT   Objective   Blood pressure (!) 109/58, pulse 97, temperature 99.5 F (37.5 C), temperature source Oral, resp. rate (!) 28, height 5\' 4"  (1.626 m), weight 52.7 kg, last menstrual period 11/24/2014, SpO2 95 %.    Vent Mode: PRVC FiO2 (%):  [40 %-50 %] 40 % Set Rate:  [28 bmp] 28 bmp Vt Set:  [450 mL] 450 mL PEEP:  [5 cmH20] 5 cmH20 Plateau Pressure:  [18 cmH20-26 cmH20] 18 cmH20   Intake/Output Summary (Last 24 hours) at 12/11/2019 6734 Last data filed at 12/11/2019 0900 Gross per 24 hour  Intake 4602.55 ml  Output 1437 ml  Net 3165.55 ml   Filed Weights   12/10/19 0237 12/11/19 0427  Weight: 47.9 kg 52.7 kg    General:  Thin, chronically ill appearing  HEENT: poor dentition  Neuro: opens eyes, follows commands, sticks tongue out on command  CV: s1 s2, tachy, regular  PULM:  BL vented breaths  GI: soft nt nd  Extremities: no edema  Skin: tattoos    Resolved Hospital Problem list     Assessment & Plan:   Poorly controlled IDDM with DKA  Last A1c 7 months ago over 10.0, ABG shows pH 7.2/17.3/74/8 - levemir given this AM - low glucose this afternoon - switched D5 to D10 - close CBG follow up  - remains on endotool   Severe Sepsis  Likely secondary to aspiration with lactic acid 3.0 - s/p fluid resuscitation  - continue ABX - cx NGTD  Acute hypoxemic respiratory failure  Possible aspiration into airway, cxr with no infiltrate  Intubated requiring mechanical ventilation  - tolerating SAT SBT - consider liberation today   Likely UGIB with gastric distension Coffee ground emesis noted in the ED - no continued signs of bleeding  - continue PPI   Acute metabolic encephalopathy  Initially agitated but following commands per ED, somnolent after Ativan -likely metabolic with possible underlying psychiatic component, previous admissions noted with hallucinations and diagnosis of bipolar 1    - possibly drug od related, UDS positive for THC  - acidosis related   AKI - likely pre-renal - continue to observe UOP - avoid nephrotoxic agents   Atrial flutter Noted on admission EKG, converted to sinus tachycardia in the ED, likely secondary to DKA and dehydration - remains on tele  - continue to observe, likely metabolically stimulated on admission   Elevated Hcg Serum Hcg was ordered in the ED and is minimally elevated at 10, pt is s/p hysterectomy.  This could possibly be secondary to HRT or ? Marijuana, if rising consider reproductive Ca -check repeat Hcg and UDS  Possible Multi-nodular Goiter -outpatient follow needed   Best practice:  Diet: NPO Pain/Anxiety/Delirium protocol (if indicated): n/a VAP protocol (if indicated): n/a DVT prophylaxis: SCD's GI prophylaxis: protonix Glucose control: insulin infusion Mobility: bed rest Code Status: full code Family Communication: no answer at numbers in chart  Disposition: ICU  Labs   CBC: Recent Labs  Lab 12/09/19 2120 12/09/19 2127 12/10/19 0023 12/10/19 0210 12/10/19 0251 12/10/19 0406 12/10/19 0836 12/10/19 1307 12/10/19 1518 12/10/19 2014 12/11/19 0420  WBC 34.3*  --  31.0*  --  29.8*  --   --   --  11.2*  --   --   NEUTROABS 29.4*  --  27.8*  --   --   --   --   --   --   --   --   HGB 16.7*   < > 16.0*   < > 16.4*   < > 13.3 9.0* 12.3 12.4 11.8*  HCT 52.8*   < > 47.3*   < > 48.0*   < > 39.0 26.8* 36.0 36.1 34.5*  MCV 100.4*  --  95.4  --  91.6  --   --   --  91.4  --   --   PLT 323  --  256  --  271  --   --   --  131*  --   --    < > = values in this interval not displayed.    Basic Metabolic Panel: Recent Labs  Lab 12/10/19 0251 12/10/19 0406 12/10/19 0757 12/10/19 0757 12/10/19 0836 12/10/19 1307 12/10/19 1518 12/10/19 2014 12/11/19 0420 12/11/19 0628  NA 142   < > 144   < > 147* 143 142 142  --  142  K 4.1   < > 4.1   < > 3.5 3.4* 3.0* 3.1*  --  3.6  CL 103   < > 109  --   --  113*  109 110  --  112*  CO2 17*   < > 26  --   --  23 25 27   --  25  GLUCOSE 572*   < > 307*  --   --  262* 288* 190*  --  170*  BUN 45*   < > 42*  --   --  33* 33* 28*  --  20  CREATININE 1.96*   < >  1.17*  --   --  0.80 0.91 0.68  --  0.57  CALCIUM 10.1   < > 9.6  --   --  7.9* 8.9 8.7*  --  8.7*  MG 1.9  --   --   --   --  1.4*  --   --  1.4*  --   PHOS 1.5*  --   --   --   --   --   --   --  1.3*  --    < > = values in this interval not displayed.   GFR: Estimated Creatinine Clearance: 67.7 mL/min (by C-G formula based on SCr of 0.57 mg/dL). Recent Labs  Lab 12/09/19 2120 12/10/19 0023 12/10/19 0251 12/10/19 1518 12/11/19 0628  PROCALCITON  --   --  0.52  --   --   WBC 34.3* 31.0* 29.8* 11.2*  --   LATICACIDVEN 3.0* 1.9 2.0*  --  1.4    Liver Function Tests: Recent Labs  Lab 12/09/19 2120  AST 17  ALT 17  ALKPHOS 74  BILITOT 2.7*  PROT 6.0*  ALBUMIN 3.4*   Recent Labs  Lab 12/10/19 0251  LIPASE 903*  AMYLASE 774*   Recent Labs  Lab 12/09/19 2120  AMMONIA 28    ABG    Component Value Date/Time   PHART 7.345 (L) 12/10/2019 0836   PCO2ART 45.2 12/10/2019 0836   PO2ART 76 (L) 12/10/2019 0836   HCO3 24.5 12/10/2019 0836   TCO2 26 12/10/2019 0836   ACIDBASEDEF 1.0 12/10/2019 0836   O2SAT 94.0 12/10/2019 0836     Coagulation Profile: Recent Labs  Lab 12/09/19 2120  INR 1.1    Cardiac Enzymes: No results for input(s): CKTOTAL, CKMB, CKMBINDEX, TROPONINI in the last 168 hours.  HbA1C: Hemoglobin A1C  Date/Time Value Ref Range Status  08/14/2019 02:36 PM 11.1 (A) 4.0 - 5.6 % Final   Hgb A1c MFr Bld  Date/Time Value Ref Range Status  05/02/2019 02:24 PM 10.5 (H) 4.8 - 5.6 % Final    Comment:             Prediabetes: 5.7 - 6.4          Diabetes: >6.4          Glycemic control for adults with diabetes: <7.0     CBG: Recent Labs  Lab 12/10/19 2020 12/10/19 2209 12/11/19 0011 12/11/19 0405 12/11/19 0743  GLUCAP 166* 141* 131* 164* 136*      This patient is critically ill with multiple organ system failure; which, requires frequent high complexity decision making, assessment, support, evaluation, and titration of therapies. This was completed through the application of advanced monitoring technologies and extensive interpretation of multiple databases. During this encounter critical care time was devoted to patient care services described in this note for 34 minutes.  [facility] 12/11/2019 9:18 AM

## 2019-12-11 NOTE — Progress Notes (Signed)
Pharmacy Electrolyte Replacement  Recent Labs:  Recent Labs    12/10/19 2014 12/11/19 0420 12/11/19 0628  K   < >  --  3.6  MG  --  1.4*  --   PHOS  --  1.3*  --   CREATININE   < >  --  0.57   < > = values in this interval not displayed.    Low Critical Values (K </= 2.5, Phos </= 1, Mg </= 1) Present: None  MD Contacted: Icard  Plan:  - KPhos 45 mmol x 1 (will also provide ~66 mEq KCl) - Magnesium 4g IV x 1  Thank you for allowing pharmacy to be a part of this patient's care.  Alycia Rossetti, PharmD, BCPS Clinical Pharmacist Clinical phone for 12/11/2019: [phone] 12/11/2019 10:21 AM   **Pharmacist phone directory can now be found on [email or web address] ([email or web address]).  Listed under [place].

## 2019-12-11 NOTE — Procedures (Signed)
Extubation Procedure Note  Patient Details:   Name: Courtney Bennett DOB: 12-24-1965 MRN: [id number]   Airway Documentation:    Vent end date: 12/11/19 Vent end time: 0935   Evaluation  O2 sats: stable throughout Complications: No apparent complications Patient did tolerate procedure well. Bilateral Breath Sounds: Diminished, Rhonchi   Yes   Pt extubated per physician order. Pt with cuff leak. Pt suctioned orally and via ETT prior. Pt extuabted to 6L nasal cannula. Tolerated well, good cough and no stridor heard at that time. Pt became agitated and was transitioned to a NRB mask at 15L/ 100% for desaturation. RT will continue to monitor.   Sharla Kidney 12/11/2019, 9:49 AM

## 2019-12-12 ENCOUNTER — Inpatient Hospital Stay (HOSPITAL_COMMUNITY): Payer: Medicare Other

## 2019-12-12 DIAGNOSIS — I361 Nonrheumatic tricuspid (valve) insufficiency: Secondary | ICD-10-CM

## 2019-12-12 DIAGNOSIS — B377 Candidal sepsis: Principal | ICD-10-CM

## 2019-12-12 DIAGNOSIS — R4182 Altered mental status, unspecified: Secondary | ICD-10-CM

## 2019-12-12 DIAGNOSIS — I480 Paroxysmal atrial fibrillation: Secondary | ICD-10-CM

## 2019-12-12 DIAGNOSIS — I10 Essential (primary) hypertension: Secondary | ICD-10-CM

## 2019-12-12 DIAGNOSIS — B49 Unspecified mycosis: Secondary | ICD-10-CM

## 2019-12-12 LAB — MAGNESIUM
Magnesium: 2.2 mg/dL (ref 1.7–2.4)
Magnesium: 2.1 mg/dL (ref 1.7–2.4)

## 2019-12-12 LAB — GLUCOSE, CAPILLARY
Glucose-Capillary: 140 mg/dL — ABNORMAL HIGH (ref 70–99)
Glucose-Capillary: 159 mg/dL — ABNORMAL HIGH (ref 70–99)
Glucose-Capillary: 166 mg/dL — ABNORMAL HIGH (ref 70–99)
Glucose-Capillary: 169 mg/dL — ABNORMAL HIGH (ref 70–99)
Glucose-Capillary: 178 mg/dL — ABNORMAL HIGH (ref 70–99)
Glucose-Capillary: 200 mg/dL — ABNORMAL HIGH (ref 70–99)
Glucose-Capillary: 261 mg/dL — ABNORMAL HIGH (ref 70–99)
Glucose-Capillary: 292 mg/dL — ABNORMAL HIGH (ref 70–99)
Glucose-Capillary: 298 mg/dL — ABNORMAL HIGH (ref 70–99)
Glucose-Capillary: 298 mg/dL — ABNORMAL HIGH (ref 70–99)
Glucose-Capillary: 320 mg/dL — ABNORMAL HIGH (ref 70–99)

## 2019-12-12 LAB — BASIC METABOLIC PANEL
Anion gap: 6 (ref 5–15)
BUN: 13 mg/dL (ref 6–20)
CO2: 24 mmol/L (ref 22–32)
Calcium: 8.6 mg/dL — ABNORMAL LOW (ref 8.9–10.3)
Chloride: 106 mmol/L (ref 98–111)
Creatinine, Ser: 0.54 mg/dL (ref 0.44–1.00)
GFR calc Af Amer: >60 mL/min (ref >60)
GFR calc non Af Amer: >60 mL/min (ref >60)
Glucose, Bld: 328 mg/dL — ABNORMAL HIGH (ref 70–99)
Potassium: 4.3 mmol/L (ref 3.5–5.1)
Sodium: 136 mmol/L (ref 135–145)

## 2019-12-12 LAB — HEMOGLOBIN A1C
Hgb A1c MFr Bld: 15.2 % — ABNORMAL HIGH (ref 4.8–5.6)
Mean Plasma Glucose: 389.54 mg/dL

## 2019-12-12 LAB — PATHOLOGIST SMEAR REVIEW

## 2019-12-12 LAB — ECHOCARDIOGRAM COMPLETE
Height: 64 in
Weight: 1947.1 oz

## 2019-12-12 LAB — PHOSPHORUS: Phosphorus: 2.5 mg/dL (ref 2.5–4.6)

## 2019-12-12 LAB — HEMOGLOBIN AND HEMATOCRIT, BLOOD
HCT: 32.2 % — ABNORMAL LOW (ref 36.0–46.0)
HCT: 32.9 % — ABNORMAL LOW (ref 36.0–46.0)
HCT: 33.7 % — ABNORMAL LOW (ref 36.0–46.0)
Hemoglobin: 10.6 g/dL — ABNORMAL LOW (ref 12.0–15.0)
Hemoglobin: 10.8 g/dL — ABNORMAL LOW (ref 12.0–15.0)
Hemoglobin: 11 g/dL — ABNORMAL LOW (ref 12.0–15.0)

## 2019-12-12 MED ORDER — INSULIN DETEMIR 100 UNIT/ML ~~LOC~~ SOLN
5.0000 [IU] | Freq: Two times a day (BID) | SUBCUTANEOUS | Status: DC
  Administered 2019-12-12 – 2019-12-15 (×7): 5 [IU] via SUBCUTANEOUS
  Filled 2019-12-12 (×10): qty 0.05

## 2019-12-12 MED ORDER — ALBUTEROL SULFATE (2.5 MG/3ML) 0.083% IN NEBU: 2.5000 mg | INHALATION_SOLUTION | Freq: Four times a day (QID) | RESPIRATORY_TRACT | Status: DC | PRN

## 2019-12-12 MED ORDER — FLUCONAZOLE IN SODIUM CHLORIDE 400-0.9 MG/200ML-% IV SOLN
400.0000 mg | INTRAVENOUS | Status: DC
  Administered 2019-12-12: 400 mg via INTRAVENOUS
  Filled 2019-12-12: qty 200

## 2019-12-12 MED ORDER — INSULIN ASPART 100 UNIT/ML ~~LOC~~ SOLN
0.0000 [IU] | SUBCUTANEOUS | Status: DC
  Administered 2019-12-12 (×2): 2 [IU] via SUBCUTANEOUS
  Administered 2019-12-12: 5 [IU] via SUBCUTANEOUS
  Administered 2019-12-12: 2 [IU] via SUBCUTANEOUS
  Administered 2019-12-13 (×2): 1 [IU] via SUBCUTANEOUS
  Administered 2019-12-13: 2 [IU] via SUBCUTANEOUS
  Administered 2019-12-13: 5 [IU] via SUBCUTANEOUS
  Administered 2019-12-14 (×4): 2 [IU] via SUBCUTANEOUS
  Administered 2019-12-15: 1 [IU] via SUBCUTANEOUS
  Administered 2019-12-15: 2 [IU] via SUBCUTANEOUS
  Administered 2019-12-15: 1 [IU] via SUBCUTANEOUS

## 2019-12-12 MED ORDER — FLUCONAZOLE IN SODIUM CHLORIDE 400-0.9 MG/200ML-% IV SOLN
800.0000 mg | Freq: Once | INTRAVENOUS | Status: AC
  Administered 2019-12-12: 800 mg via INTRAVENOUS
  Filled 2019-12-12 (×2): qty 400

## 2019-12-12 NOTE — Consult Note (Signed)
Chief Complaint/Reason for Consultation: Candidemia, rule out endogenous endophthalmitis  HPI: 54 yo female PMH DM, bipolar, HTN admitted for AMS and DKA, septic shock. Currently in the ICU and blood cultures have returned candidemia. Consultation requested to rule out endogenous fungal endophthalmitis or vitritis. Plans to transfer from the ICU in the near future.  Pt notes she has been told in the past that she has macular degeneration in the OS. She denies any change in her vision. She denies flashes of light, floaters, or pain. She states she does wear reading glasses. No history of any eye surgery.   ROS: otherwise as in HPI  Patient Active Problem List   Diagnosis Date Noted  . DKA (diabetic ketoacidoses) ([place]) 12/10/2019  . Altered mental status 12/10/2019  . Coffee ground emesis 12/10/2019  . AKI (acute kidney injury) ([place]) 12/10/2019  . Leukocytosis 12/10/2019  . Elevated serum hCG 12/10/2019  . Aspiration into airway 12/10/2019  . Paroxysmal atrial flutter ([place]) 12/10/2019  . Severe sepsis with acute organ dysfunction ([place]) 12/10/2019  . Other specified anxiety disorders 02/27/2019  . Type 2 diabetes mellitus with diabetic polyneuropathy, without long-term current use of insulin ([place]) 02/27/2019  . Atypical pneumonia 11/24/2014  . Acute respiratory failure ([place]) 11/24/2014  . Bipolar affective disorder, current episode hypomanic ([place]) 08/10/2014  . Paranoid ([place]) 08/10/2014   No current facility-administered medications on file prior to encounter.   Current Outpatient Medications on File Prior to Encounter  Medication Sig Dispense Refill  . albuterol (VENTOLIN HFA) 108 (90 Base) MCG/ACT inhaler Inhale 1 puff into the lungs as needed.     . dapagliflozin propanediol (FARXIGA) 10 MG TABS tablet Take 10 mg by mouth daily before breakfast. 90 tablet 3  . omeprazole (PRILOSEC) 40 MG capsule Take 40 mg by mouth daily.    . predniSONE (DELTASONE) 10 MG tablet Take 10 mg by mouth  daily with breakfast.    . telmisartan (MICARDIS) 40 MG tablet Take 40 mg by mouth daily.    Marland Kitchen atorvastatin (LIPITOR) 20 MG tablet Take 20 mg by mouth daily.    . budesonide-formoterol (SYMBICORT) 160-4.5 MCG/ACT inhaler Inhale 2 puffs into the lungs 2 (two) times daily.    . cephALEXin (KEFLEX) 500 MG capsule Take 500 mg by mouth 3 (three) times daily.    . chlorhexidine (PERIDEX) 0.12 % solution as directed.    . Continuous Blood Gluc Sensor (FREESTYLE LIBRE 14 DAY SENSOR) MISC 1 application by Subdermal route See admin instructions. Check blood glucose 1-2 times daily. E11.65 2 each 11  . CVS B-12 500 MCG tablet Take 500 mcg by mouth daily.    . Dulaglutide (TRULICITY) 1.5 UT/6.5YY SOPN Inject 1.5 mg into the skin once a week. 4 pen 11  . DULoxetine (CYMBALTA) 60 MG capsule Take 1 capsule (60 mg total) by mouth daily. 90 capsule 1  . estrogens, conjugated, (PREMARIN) 0.3 MG tablet Take 1 tablet (0.3 mg total) by mouth daily. 30 tablet 2  . gabapentin (NEURONTIN) 300 MG capsule Take 300 mg by mouth 2 (two) times daily.     Marland Kitchen glipiZIDE (GLUCOTROL) 5 MG tablet Take 1 tablet (5 mg total) by mouth 2 (two) times daily before a meal. 60 tablet 11  . glucose blood (ACCU-CHEK AVIVA PLUS) test strip 1 each by Other route daily. And lancets 2/day 100 each 3  . ketoconazole (NIZORAL) 2 % shampoo APPLY 1 APPLICATION TOPICALLY 2 (TWO) TIMES A WEEK. 120 mL 0  . lisinopril (ZESTRIL) 40 MG  tablet Take 40 mg by mouth daily.     . metoCLOPramide (REGLAN) 10 MG tablet Take 1 tablet (10 mg total) by mouth 4 (four) times daily -  before meals and at bedtime. 56 tablet 0  . sertraline (ZOLOFT) 100 MG tablet Take 1.5 tablets (150 mg total) by mouth daily. 135 tablet 3  . traMADol (ULTRAM) 50 MG tablet Take 50 mg by mouth 3 (three) times daily as needed for moderate pain.    . valACYclovir (VALTREX) 500 MG tablet TAKE 1 TABLET (500 MG TOTAL) BY MOUTH 2 (TWO) TIMES DAILY FOR 30 DAYS. (Patient taking differently: Take  500 mg by mouth 2 (two) times daily. ) 180 tablet 1  . venlafaxine XR (EFFEXOR-XR) 75 MG 24 hr capsule Take 1 capsule (75 mg total) by mouth daily with breakfast. 90 capsule 3   Allergies  Allergen Reactions  . Metformin Diarrhea  . Lamictal [Lamotrigine] Rash  . Latex Itching and Rash  . Sulfa Antibiotics Rash  . Sulfur Hives, Rash and Other (See Comments)    "Burns the skin," also  . Tape Rash and Other (See Comments)    Irritates the skin   Family History  Problem Relation Age of Onset  . Diabetes Father       EXAMINATION  VAsc (near with +2.5D lens): OD: 20/25-1 OS: 20/25-1  Pupils:  OD: Equal, round, reactive, no APD OS: Equal, round, reactive, no APD  T(Pen): OD: 10   mm Hg OS: 11   mm Hg  CVF: full to finger counting OU EOM: full OU, no limitation or restriction OU  Anterior segment Exam (external and 20D lens with indirect): Ext/Lids: normal OU Conj/Sclera: white and quiet OU, anicteric OU Cornea: clear OU, no abrasion or infiltrate OU AC: Deep and Quiet OU, no hypopyon OU Iris: Round and Flat OU Lens: Mild nuclear sclerosis OU  Dilated OU with phenylephrine and tropicamide OU @ 322PM  Dilated Fundus Exam: Vitreous: Clear OU, no snowballs or snowbanking OU Disc: sharp and pink with 0.3 c/d OU, no edema or pallor OU Macula: flat and dry OU, no infiltrates OU, OS mild retinal pigment epithelial changes/atrophy in central fovea without subretinal fluid or subretinal hemorrhage Vessels: normal distribution OU, perfused OU, no vascular sheathing OU Periphery: flat and attached 360 without breaks or tears OU, no chorioretinal infiltrates OU   Imp/Plan:  1. Candidemia - no vitirits, no chorioretinal infiltrates OU - no evidence of fungal endophthalmitis or fungal vitritis on exam  2. Nuclear sclerosis OU - mild, minimal to no visual significant, can follow with routine eye exams outpatient  3. Mild retinal pigment atrophy OS - patient has been told she  has mild macular degeneration OS, suggest routine eye exam at least once a year. Seek eye care immediately if there is any decrease in vision or distortion.   Lonia Skinner, M.D. Ophthalmology [facility]

## 2019-12-12 NOTE — Progress Notes (Signed)
NAME:  Courtney Bennett, MRN:  [id number], DOB:  January 12, 1966, LOS: 2 ADMISSION DATE:  12/09/2019, CONSULTATION DATE:  12/12/19 REFERRING MD:  EDP, CHIEF COMPLAINT:  AMS   Brief History   54 y.o. F with PMH of DM, Bipolar Disorder and HTN who was found by family altered with coffee ground emesis.  Found to be in DKA with continued confusion. PCCM consulted for admission.  History of present illness   Courtney Bennett is a 54 y.o. F with PMH of poorly controlled DM, possible multinodular goiter, Bipolar Disorder and HTN who was found by family altered on the floor with dark emesis.  Per EMS report patient had abdominal pain yesterday and had presented to another ED, but left prior to evaluation.    On arrival to the ED, patient was confused and agitated and glucose >1100 with anion gap 44 and bicarb 7 .   Work-up significant for leukocytosis of 34k, creatinine 2.9, lactic acid 3.0, pH 7.2.  CT abdomen/pelvis significant for distended stomach, CT head without acute findings.   She was started on an insulin gtt, given 3L LR, and started on Vancomycin/Cefepime.  Serum Hcg was slightly elevated, however pt is s/p hysterectomy   She remained extremely agitated and was given Ativan with subsequent somnolence and PCCM consulted for admission  Past Medical History   has a past medical history of Bipolar 1 disorder ([place]), Diabetes mellitus, Fibromyalgia, and Hypertension.  Significant Hospital Events   12/12/19 Admit to PCCM  Consults:    Procedures:    Significant Diagnostic Tests:  7/10 CXR>>no acute findings 7/10 CT head>>no acute findings 7/10 CT abd/pelvis>>Mild patchy areas of bibasilar atelectasis and/or infiltrate, Marked severity gastric distension. Sequelae associated with gastric outlet obstruction cannot be excluded.  Micro Data:  7/10 BCx2>>NGTD  7/10 UC>>NGTD 7/11 Sars-CoV-2>> neg  7/10 MRSA swap positive   Antimicrobials:  Vancomycin 7/10- Cefepime  7/10-  Interim  history/subjective:   Awake. Resting in bed. Still on low dose pressor and precedex   Objective   Blood pressure (!) 102/57, pulse 70, temperature (!) 96.6 F (35.9 C), temperature source Axillary, resp. rate 16, height 5\' 4"  (1.626 m), weight 55.2 kg, last menstrual period 11/24/2014, SpO2 98 %.    FiO2 (%):  [100 %] 100 %   Intake/Output Summary (Last 24 hours) at 12/12/2019 0810 Last data filed at 12/12/2019 0644 Gross per 24 hour  Intake 4430.21 ml  Output 1905 ml  Net 2525.21 ml   Filed Weights   12/10/19 0237 12/11/19 0427 12/12/19 0500  Weight: 47.9 kg 52.7 kg 55.2 kg    General:  Thin, chronically ill appearing  HEENT: ncat, poor dentition  Neuro: alert, following commands, no focal deficit  CV: s1 s2, RRR PULM:  BL vented breaths  GI: soft, nt nd  Extremities: no edema   Skin: tattoos   Resolved Hospital Problem list     Assessment & Plan:   Poorly controlled IDDM with DKA  Last A1c 7 months ago over 10.0, ABG shows pH 7.2/17.3/74/8 - levemir held yesterday - restart a lower dose base insulin to prevent going back into DKA  - D10 infusion decreased  - remains NPO  - SLP to see today  - may need a cortrack   Septic shock, candida albicans bacteremia  - diflucan  - ID consult  - likely need TTE vs TEE  - possible fundoscopic exam by ophthalmology at some point  - repeat BCx document sterility  - wean off  NEpi today if possible   Acute hypoxemic respiratory failure  Possible aspiration into airway, cxr with no infiltrate  Intubated requiring mechanical ventilation  - weaning FiO2 as tolerated  Likely UGIB with gastric distension Coffee ground emesis noted in the ED - continue PPI   Acute metabolic encephalopathy  Initially agitated but following commands per ED, somnolent after Ativan -likely metabolic with possible underlying psychiatic component, previous admissions noted with hallucinations and diagnosis of bipolar disease  - stable at this  point - continue to observe  - can use prns if needed  - precedex stopped   AKI - improving  - follow UOP  - avoid nephrotoxic drugs   Atrial flutter Noted on admission EKG, converted to sinus tachycardia in the ED, likely secondary to DKA and dehydration - on tele   Elevated Hcg Serum Hcg was ordered in the ED and is minimally elevated at 10, pt is s/p hysterectomy.  This could possibly be secondary to HRT or ? Marijuana, if rising consider reproductive Ca  Possible Multi-nodular Goiter Outpatient follow up    Best practice:  Diet: NPO Pain/Anxiety/Delirium protocol (if indicated): n/a VAP protocol (if indicated): n/a DVT prophylaxis: SCD's GI prophylaxis: protonix Glucose control: insulin infusion Mobility: bed rest Code Status: full code Family Communication: patient upated  Disposition: possibly stable for transfer from ICU later this afternoon.   Labs   CBC: Recent Labs  Lab 12/09/19 2120 12/09/19 2127 12/10/19 0023 12/10/19 0210 12/10/19 0251 12/10/19 0406 12/10/19 1518 12/10/19 1518 12/10/19 2014 12/11/19 0420 12/11/19 1533 12/11/19 1959 12/12/19 0427  WBC 34.3*  --  31.0*  --  29.8*  --  11.2*  --   --   --   --   --   --   NEUTROABS 29.4*  --  27.8*  --   --   --   --   --   --   --   --   --   --   HGB 16.7*   < > 16.0*   < > 16.4*   < > 12.3   < > 12.4 11.8* 11.4* 10.6* 10.6*  HCT 52.8*   < > 47.3*   < > 48.0*   < > 36.0   < > 36.1 34.5* 34.5* 32.3* 32.2*  MCV 100.4*  --  95.4  --  91.6  --  91.4  --   --   --   --   --   --   PLT 323  --  256  --  271  --  131*  --   --   --   --   --   --    < > = values in this interval not displayed.    Basic Metabolic Panel: Recent Labs  Lab 12/10/19 0251 12/10/19 0406 12/10/19 0757 12/10/19 0757 12/10/19 0836 12/10/19 1307 12/10/19 1518 12/10/19 2014 12/11/19 0420 12/11/19 0628 12/11/19 1959 12/12/19 0059 12/12/19 0427  NA 142   < > 144   < > 147* 143 142 142  --  142  --   --   --   K 4.1    < > 4.1   < > 3.5 3.4* 3.0* 3.1*  --  3.6  --   --   --   CL 103   < > 109  --   --  113* 109 110  --  112*  --   --   --   CO2 17*   < >  26  --   --  23 25 27   --  25  --   --   --   GLUCOSE 572*   < > 307*  --   --  262* 288* 190*  --  170*  --   --   --   BUN 45*   < > 42*  --   --  33* 33* 28*  --  20  --   --   --   CREATININE 1.96*   < > 1.17*  --   --  0.80 0.91 0.68  --  0.57  --   --   --   CALCIUM 10.1   < > 9.6  --   --  7.9* 8.9 8.7*  --  8.7*  --   --   --   MG 1.9  --   --   --   --  1.4*  --   --  1.4*  --   --  2.2  --   PHOS 1.5*  --   --   --   --   --   --   --  1.3*  --  4.0  --  2.5   < > = values in this interval not displayed.   GFR: Estimated Creatinine Clearance: 70.2 mL/min (by C-G formula based on SCr of 0.57 mg/dL). Recent Labs  Lab 12/09/19 2120 12/10/19 0023 12/10/19 0251 12/10/19 1518 12/11/19 0628  PROCALCITON  --   --  0.52  --   --   WBC 34.3* 31.0* 29.8* 11.2*  --   LATICACIDVEN 3.0* 1.9 2.0*  --  1.4    Liver Function Tests: Recent Labs  Lab 12/09/19 2120  AST 17  ALT 17  ALKPHOS 74  BILITOT 2.7*  PROT 6.0*  ALBUMIN 3.4*   Recent Labs  Lab 12/10/19 0251  LIPASE 903*  AMYLASE 774*   Recent Labs  Lab 12/09/19 2120  AMMONIA 28    ABG    Component Value Date/Time   PHART 7.345 (L) 12/10/2019 0836   PCO2ART 45.2 12/10/2019 0836   PO2ART 76 (L) 12/10/2019 0836   HCO3 24.5 12/10/2019 0836   TCO2 26 12/10/2019 0836   ACIDBASEDEF 1.0 12/10/2019 0836   O2SAT 94.0 12/10/2019 0836     Coagulation Profile: Recent Labs  Lab 12/09/19 2120  INR 1.1    Cardiac Enzymes: No results for input(s): CKTOTAL, CKMB, CKMBINDEX, TROPONINI in the last 168 hours.  HbA1C: Hemoglobin A1C  Date/Time Value Ref Range Status  08/14/2019 02:36 PM 11.1 (A) 4.0 - 5.6 % Final   Hgb A1c MFr Bld  Date/Time Value Ref Range Status  05/02/2019 02:24 PM 10.5 (H) 4.8 - 5.6 % Final    Comment:             Prediabetes: 5.7 - 6.4          Diabetes:  >6.4          Glycemic control for adults with diabetes: <7.0     CBG: Recent Labs  Lab 12/11/19 2213 12/11/19 2318 12/12/19 0321 12/12/19 0611 12/12/19 0750  GLUCAP 101* 105* 200* 298* 320*    This patient is critically ill with multiple organ system failure; which, requires frequent high complexity decision making, assessment, support, evaluation, and titration of therapies. This was completed through the application of advanced monitoring technologies and extensive interpretation of multiple databases. During this encounter critical care time was devoted to patient care services described  in this note for 32 minutes.  [facility] 12/12/2019 8:28 AM

## 2019-12-12 NOTE — Progress Notes (Signed)
PHARMACY - PHYSICIAN COMMUNICATION CRITICAL VALUE ALERT - BLOOD CULTURE IDENTIFICATION (BCID)  Courtney Bennett is an 54 y.o. female who presented to [facility] on 12/09/2019 with a chief complaint of AMS.  Assessment:  Started on broad-spectrum ABX for severe sepsis, now w/ 1 of 3 blood cx bottles growing Candida albicans.  Name of physician (or Provider) Contacted: SSommer  Current antibiotics: vancomycin and cefepime  Changes to prescribed antibiotics recommended:  Recommendations accepted by provider: Start fluconazole 800mg  x1 followed by 400mg  IV Q24H; ID team will be alerted via [place].  Results for orders placed or performed during the hospital encounter of 12/09/19  Blood Culture ID Panel (Reflexed) (Collected: 12/09/2019  9:18 PM)  Result Value Ref Range   Enterococcus species NOT DETECTED NOT DETECTED   Listeria monocytogenes NOT DETECTED NOT DETECTED   Staphylococcus species NOT DETECTED NOT DETECTED   Staphylococcus aureus (BCID) NOT DETECTED NOT DETECTED   Streptococcus species NOT DETECTED NOT DETECTED   Streptococcus agalactiae NOT DETECTED NOT DETECTED   Streptococcus pneumoniae NOT DETECTED NOT DETECTED   Streptococcus pyogenes NOT DETECTED NOT DETECTED   Acinetobacter baumannii NOT DETECTED NOT DETECTED   Enterobacteriaceae species NOT DETECTED NOT DETECTED   Enterobacter cloacae complex NOT DETECTED NOT DETECTED   Escherichia coli NOT DETECTED NOT DETECTED   Klebsiella oxytoca NOT DETECTED NOT DETECTED   Klebsiella pneumoniae NOT DETECTED NOT DETECTED   Proteus species NOT DETECTED NOT DETECTED   Serratia marcescens NOT DETECTED NOT DETECTED   Haemophilus influenzae NOT DETECTED NOT DETECTED   Neisseria meningitidis NOT DETECTED NOT DETECTED   Pseudomonas aeruginosa NOT DETECTED NOT DETECTED   Candida albicans DETECTED (A) NOT DETECTED   Candida glabrata NOT DETECTED NOT DETECTED   Candida krusei NOT DETECTED NOT DETECTED   Candida parapsilosis NOT DETECTED  NOT DETECTED   Candida tropicalis NOT DETECTED NOT DETECTED    Wynona Neat, PharmD, BCPS  12/12/2019  12:02 AM

## 2019-12-12 NOTE — Progress Notes (Signed)
PCCM:  Discussed glucose management with DM coordinator.   Recheck in afternoon.   Levemir + q4H coverage   Garner Nash, DO [facility] 12/12/2019 10:00 AM

## 2019-12-12 NOTE — Progress Notes (Signed)
[place] Progress Note Patient Name: Courtney Bennett DOB: 07-20-1965 MRN: [id number]   Date of Service  12/12/2019  HPI/Events of Note  Hyperglycemia - Blood glucose = 298.  eICU Interventions  Will decrease D10W IV infusion from 125 mL/hour to 50 mL/hour.      Intervention Category Major Interventions: Hyperglycemia - active titration of insulin therapy  Lysle Dingwall 12/12/2019, 6:33 AM

## 2019-12-12 NOTE — Progress Notes (Signed)
  Echocardiogram 2D Echocardiogram has been performed.  Courtney Bennett 12/12/2019, 9:38 AM

## 2019-12-12 NOTE — Consult Note (Addendum)
[facility]       Reason for Consult: Candidemia    Referring Physician: CHAMP autoconsult  Principal Problem:   Severe sepsis with acute organ dysfunction (HCC) Active Problems:   Bipolar affective disorder, current episode hypomanic ([place])   Paranoid ([place])   DKA (diabetic ketoacidoses) (HCC)   Altered mental status   Coffee ground emesis   AKI (acute kidney injury) ([place])   Leukocytosis   Elevated serum hCG   Aspiration into airway   Paroxysmal atrial flutter ([place])   . chlorhexidine gluconate (MEDLINE KIT)  15 mL Mouth Rinse BID  . Chlorhexidine Gluconate Cloth  6 each Topical Daily  . docusate  100 mg Per Tube BID  . hydrocortisone sod succinate (SOLU-CORTEF) inj  50 mg Intravenous Q6H  . insulin aspart  0-9 Units Subcutaneous Q4H  . insulin detemir  5 Units Subcutaneous BID  . mupirocin ointment  1 application Nasal BID  . pantoprazole (PROTONIX) IV  40 mg Intravenous Q12H  . polyethylene glycol  17 g Oral Daily  . sodium chloride flush  10-40 mL Intracatheter Q12H    Recommendations: TTE -result pending Ophthalmologic exam - I have discussed with Dr. Eulas Post who will see the patient Repeat blood cultures - sent this am  Assessment: She has Candidemia in the setting of poorly controlled DM and ICU admission.     Antibiotics: Vancomycin, cefepime, now stopped fluconazole  HPI: TWYLLA ARCENEAUX is a 54 y.o. female with history of poorly controlled DM, bipolar, HTN, found altered and noted DKA.  Brought in by EMS and required intubation for airway protection.  Patient does not give much information and does not remember the event that brought her in.  Does not endorse any new visual issues.  Tmax 100.5.  WBC intially 29.8, down to 11.  No complaints now.     Review of Systems:  Constitutional: negative for fevers GI: no diarrhea MS: some mild edema All other systems reviewed and are negative    Past Medical History:  Diagnosis Date   . Bipolar 1 disorder ([place])   . Diabetes mellitus   . Fibromyalgia   . Hypertension     Social History   Tobacco Use  . Smoking status: Former Smoker    Types: Cigarettes    Quit date: 11/21/2014    Years since quitting: 5.0  . Smokeless tobacco: Never Used  Substance Use Topics  . Alcohol use: Yes    Comment: rare  . Drug use: No    Family History  Problem Relation Age of Onset  . Diabetes Father     Allergies  Allergen Reactions  . Metformin Diarrhea  . Lamictal [Lamotrigine] Rash  . Latex Itching and Rash  . Sulfa Antibiotics Rash  . Sulfur Hives, Rash and Other (See Comments)    "Burns the skin," also  . Tape Rash and Other (See Comments)    Irritates the skin    Physical Exam: Constitutional: in no apparent distress  Vitals:   12/12/19 0645 12/12/19 0700  BP: (!) 102/57   Pulse: 70   Resp: 16   Temp:  (!) 96.6 F (35.9 C)  SpO2: 98%    EYES: anicteric Cardiovascular: Cor RRR Respiratory: clear; GI: Bowel sounds are normal, liver is not enlarged, spleen is not enlarged Musculoskeletal: no pedal edema noted Skin: negatives: no rash Neuro: non-focal  Lab Results  Component Value Date   WBC 11.2 (H) 12/10/2019   HGB 10.6 (L)  12/12/2019   HCT 32.2 (L) 12/12/2019   MCV 91.4 12/10/2019   PLT 131 (L) 12/10/2019    Lab Results  Component Value Date   CREATININE 0.57 12/11/2019   BUN 20 12/11/2019   NA 142 12/11/2019   K 3.6 12/11/2019   CL 112 (H) 12/11/2019   CO2 25 12/11/2019    Lab Results  Component Value Date   ALT 17 12/09/2019   AST 17 12/09/2019   ALKPHOS 74 12/09/2019     Microbiology: Recent Results (from the past 240 hour(s))  Urine culture     Status: None   Collection Time: 12/09/19  9:10 PM   Specimen: Urine, Random  Result Value Ref Range Status   Specimen Description URINE, RANDOM  Final   Special Requests NONE  Final   Culture   Final    NO GROWTH Performed at [facility], [address] [place], [place]  [address]    Report Status 12/11/2019 FINAL  Final  Blood Culture (routine x 2)     Status: None (Preliminary result)   Collection Time: 12/09/19  9:15 PM   Specimen: BLOOD  Result Value Ref Range Status   Specimen Description BLOOD RIGHT ANTECUBITAL  Final   Special Requests   Final    BOTTLES DRAWN AEROBIC AND ANAEROBIC Blood Culture adequate volume   Culture   Final    NO GROWTH 2 DAYS Performed at [facility], [place] [address], [place], [place] [address]    Report Status PENDING  Incomplete  Blood Culture (routine x 2)     Status: Abnormal (Preliminary result)   Collection Time: 12/09/19  9:18 PM   Specimen: BLOOD RIGHT HAND  Result Value Ref Range Status   Specimen Description BLOOD RIGHT HAND  Final   Special Requests   Final    BOTTLES DRAWN AEROBIC ONLY Blood Culture results may not be optimal due to an inadequate volume of blood received in culture bottles   Culture  Setup Time   Final    AEROBIC BOTTLE ONLY YEAST CRITICAL RESULT CALLED TO, READ BACK BY AND VERIFIED WITH: V BRYK PHARMD 12/11/19 2345 JDW    Culture (A)  Final    CANDIDA ALBICANS Sent to [place] for further susceptibility testing. Performed at [facility], [place] [address] [place], [place] [address]    Report Status PENDING  Incomplete  Blood Culture ID Panel (Reflexed)     Status: Abnormal   Collection Time: 12/09/19  9:18 PM  Result Value Ref Range Status   Enterococcus species NOT DETECTED NOT DETECTED Final   Listeria monocytogenes NOT DETECTED NOT DETECTED Final   Staphylococcus species NOT DETECTED NOT DETECTED Final   Staphylococcus aureus (BCID) NOT DETECTED NOT DETECTED Final   Streptococcus species NOT DETECTED NOT DETECTED Final   Streptococcus agalactiae NOT DETECTED NOT DETECTED Final   Streptococcus pneumoniae NOT DETECTED NOT DETECTED Final   Streptococcus pyogenes NOT DETECTED NOT DETECTED Final   Acinetobacter baumannii NOT DETECTED NOT DETECTED Final   Enterobacteriaceae  species NOT DETECTED NOT DETECTED Final   Enterobacter cloacae complex NOT DETECTED NOT DETECTED Final   Escherichia coli NOT DETECTED NOT DETECTED Final   Klebsiella oxytoca NOT DETECTED NOT DETECTED Final   Klebsiella pneumoniae NOT DETECTED NOT DETECTED Final   Proteus species NOT DETECTED NOT DETECTED Final   Serratia marcescens NOT DETECTED NOT DETECTED Final   Haemophilus influenzae NOT DETECTED NOT DETECTED Final   Neisseria meningitidis NOT DETECTED  NOT DETECTED Final   Pseudomonas aeruginosa NOT DETECTED NOT DETECTED Final   Candida albicans DETECTED (A) NOT DETECTED Final    Comment: CRITICAL RESULT CALLED TO, READ BACK BY AND VERIFIED WITH: V BRYK PHARMD 12/11/19 2345 JDW    Candida glabrata NOT DETECTED NOT DETECTED Final   Candida krusei NOT DETECTED NOT DETECTED Final   Candida parapsilosis NOT DETECTED NOT DETECTED Final   Candida tropicalis NOT DETECTED NOT DETECTED Final    Comment: Performed at [facility], [place] [address] [place], [place] [address]  SARS Coronavirus 2 by RT PCR (hospital order, performed in [facility] hospital lab) Nasopharyngeal Nasopharyngeal Swab     Status: None   Collection Time: 12/10/19 12:16 AM   Specimen: Nasopharyngeal Swab  Result Value Ref Range Status   SARS Coronavirus 2 NEGATIVE NEGATIVE Final    Comment: (NOTE) SARS-CoV-2 target nucleic acids are NOT DETECTED.  The SARS-CoV-2 RNA is generally detectable in upper and lower respiratory specimens during the acute phase of infection. The lowest concentration of SARS-CoV-2 viral copies this assay can detect is 250 copies / mL. A negative result does not preclude SARS-CoV-2 infection and should not be used as the sole basis for treatment or other patient management decisions.  A negative result may occur with improper specimen collection / handling, submission of specimen other than nasopharyngeal swab, presence of viral mutation(s) within the areas targeted by this assay, and  inadequate number of viral copies (<250 copies / mL). A negative result must be combined with clinical observations, patient history, and epidemiological information.  Fact Sheet for Patients:   StrictlyIdeas.no  Fact Sheet for Healthcare Providers: BankingDealers.co.za  This test is not yet approved or  cleared by the [place] FDA and has been authorized for detection and/or diagnosis of SARS-CoV-2 by FDA under an Emergency Use Authorization (EUA).  This EUA will remain in effect (meaning this test can be used) for the duration of the COVID-19 declaration under Section 564(b)(1) of the Act, 21 U.S.C. section 360bbb-3(b)(1), unless the authorization is terminated or revoked sooner.  Performed at [facility], [place] [address] [place], [place] [address]   MRSA PCR Screening     Status: Abnormal   Collection Time: 12/10/19  2:39 AM   Specimen: Nasal Mucosa; Nasopharyngeal  Result Value Ref Range Status   MRSA by PCR POSITIVE (A) NEGATIVE Final    Comment:        The GeneXpert MRSA Assay (FDA approved for NASAL specimens only), is one component of a comprehensive MRSA colonization surveillance program. It is not intended to diagnose MRSA infection nor to guide or monitor treatment for MRSA infections. RESULT CALLED TO, READ BACK BY AND VERIFIED WITH: [facility] RN 3300 12/10/2019 MITCHELL,L Performed at [facility], [place] [address] [place], [place] [address]     Robert W Comer, [place] for Infectious Disease [facility] [email or web address] 12/12/2019, 9:54 AM

## 2019-12-12 NOTE — Evaluation (Signed)
Clinical/Bedside Swallow Evaluation Patient Details  Name: Courtney Bennett MRN: [id number] Date of Birth: 06/15/1965  Today's Date: 12/12/2019 Time: SLP Start Time (ACUTE ONLY): 1127 SLP Stop Time (ACUTE ONLY): 1150 SLP Time Calculation (min) (ACUTE ONLY): 23 min  Past Medical History:  Past Medical History:  Diagnosis Date  . Bipolar 1 disorder ([place])   . Diabetes mellitus   . Fibromyalgia   . Hypertension    Past Surgical History:  Past Surgical History:  Procedure Laterality Date  . ABDOMINAL HYSTERECTOMY  01/2019  . CESAREAN SECTION    . HERNIA REPAIR     HPI:  54 y.o. with PMH of DM, Bipolar Disorder, fibromyalgia and HTN who was found by family altered with coffee ground emesis. Found to be in DKA with continued confusion. Intubated 18 hours. CXR lungs are clear.    Assessment / Plan / Recommendation Clinical Impression  Primary barrier to safe intake is her lethargy. She was awake but very drowsy and Precedex does not appear cleared from her system. All movements were slow, swallow appeared weak subjectively. Her baseline cough makes it difficult to predict source and she may need instrumental assessment. Intubated for less than 2 days and will likely exhibit safer swallowing when more alert. Recommend continue NPO with ice chips after oral care intermittently.     SLP Visit Diagnosis: Dysphagia, unspecified (R13.10)    Aspiration Risk  Mild aspiration risk    Diet Recommendation Ice chips PRN after oral care   Medication Administration:  (no meds ordered)    Other  Recommendations Oral Care Recommendations: Oral care QID   Follow up Recommendations Other (comment) (TBD)      Frequency and Duration min 2x/week  2 weeks       Prognosis Prognosis for Safe Diet Advancement: Good      Swallow Study   General HPI: 54 y.o. with PMH of DM, Bipolar Disorder, fibromyalgia and HTN who was found by family altered with coffee ground emesis. Found to be in DKA with  continued confusion. Intubated 18 hours. CXR lungs are clear.  Type of Study: Bedside Swallow Evaluation Previous Swallow Assessment:  (none) Diet Prior to this Study: NPO Temperature Spikes Noted: No Respiratory Status: Nasal cannula History of Recent Intubation: Yes Length of Intubations (days):  (one day and 1/2) Date extubated: 12/09/19 Behavior/Cognition: Lethargic/Drowsy;Cooperative;Pleasant mood;Requires cueing Oral Cavity Assessment: Other (comment) (moist secretions- whistish-thrush) Oral Care Completed by SLP: Yes Oral Cavity - Dentition: Missing dentition (missing lower posterior and lower anterior) Vision: Functional for self-feeding Self-Feeding Abilities: Able to feed self;Needs set up Patient Positioning: Upright in bed Baseline Vocal Quality: Hoarse;Low vocal intensity Volitional Cough: Strong Volitional Swallow: Able to elicit    Oral/Motor/Sensory Function Overall Oral Motor/Sensory Function: Within functional limits   Ice Chips Ice chips: Impaired Presentation: Spoon Pharyngeal Phase Impairments: Throat Clearing - Delayed   Thin Liquid Thin Liquid: Impaired Presentation: Cup;Spoon Pharyngeal  Phase Impairments: Cough - Delayed    Nectar Thick Nectar Thick Liquid: Impaired Presentation: Cup   Honey Thick Honey Thick Liquid: Not tested   Puree Puree: Impaired Pharyngeal Phase Impairments: Throat Clearing - Delayed   Solid     Solid: Not tested      Houston Siren 12/12/2019,12:11 PM  Orbie Pyo Colvin Caroli.Ed Risk analyst [phone] Office [phone]

## 2019-12-12 NOTE — Progress Notes (Signed)
PCCM:  I spoke with Dr. Wynelle Cleveland from [facility] who has agreed to take patient tomorrow onto there service.   Garner Nash, DO [facility] 12/12/2019 3:22 PM

## 2019-12-12 NOTE — Progress Notes (Signed)
Inpatient Diabetes Program Recommendations  AACE/ADA: New Consensus Statement on Inpatient Glycemic Control (2015)  Target Ranges:  Prepandial:   less than 140 mg/dL      Peak postprandial:   less than 180 mg/dL (1-2 hours)      Critically ill patients:  140 - 180 mg/dL   Lab Results  Component Value Date   GLUCAP 320 (H) 12/12/2019   HGBA1C 11.1 (A) 08/14/2019    Review of Glycemic Control Results for Courtney Bennett, Courtney Bennett (MRN [id number]) as of 12/12/2019 09:03  Ref. Range 12/11/2019 23:18 12/12/2019 03:21 12/12/2019 06:11 12/12/2019 07:50  Glucose-Capillary Latest Ref Range: 70 - 99 mg/dL 105 (H) 200 (H) 298 (H) 320 (H)   Diabetes history: Type 2 DM Outpatient Diabetes medications: Farxiga 10 mg QD, Trulicity 1.5 mg Q wk, Glipizide 5 mg BID Current orders for Inpatient glycemic control: Levemir 5 units BID D10% @ 50 % Solucortef 50 mg Q6H Inpatient Diabetes Program Recommendations:    Noted hypoglycemia and subsequent insulin changes.   Recommend: -Discontinuing D10% Increase Levemir to 10 units BID Add Novolog 1-3 units Q4H Secure chat sent to MD.  Thanks, Bronson Curb, MSN, RNC-OB Diabetes Coordinator [phone] (8a-5p)

## 2019-12-13 ENCOUNTER — Inpatient Hospital Stay (HOSPITAL_COMMUNITY): Payer: Medicare Other

## 2019-12-13 LAB — GLUCOSE, CAPILLARY
Glucose-Capillary: 106 mg/dL — ABNORMAL HIGH (ref 70–99)
Glucose-Capillary: 119 mg/dL — ABNORMAL HIGH (ref 70–99)
Glucose-Capillary: 125 mg/dL — ABNORMAL HIGH (ref 70–99)
Glucose-Capillary: 131 mg/dL — ABNORMAL HIGH (ref 70–99)
Glucose-Capillary: 147 mg/dL — ABNORMAL HIGH (ref 70–99)
Glucose-Capillary: 149 mg/dL — ABNORMAL HIGH (ref 70–99)
Glucose-Capillary: 179 mg/dL — ABNORMAL HIGH (ref 70–99)
Glucose-Capillary: 200 mg/dL — ABNORMAL HIGH (ref 70–99)
Glucose-Capillary: 289 mg/dL — ABNORMAL HIGH (ref 70–99)

## 2019-12-13 LAB — BASIC METABOLIC PANEL
Anion gap: 6 (ref 5–15)
BUN: 12 mg/dL (ref 6–20)
CO2: 25 mmol/L (ref 22–32)
Calcium: 8.5 mg/dL — ABNORMAL LOW (ref 8.9–10.3)
Chloride: 109 mmol/L (ref 98–111)
Creatinine, Ser: 0.44 mg/dL (ref 0.44–1.00)
GFR calc Af Amer: >60 mL/min (ref >60)
GFR calc non Af Amer: >60 mL/min (ref >60)
Glucose, Bld: 132 mg/dL — ABNORMAL HIGH (ref 70–99)
Potassium: 3.4 mmol/L — ABNORMAL LOW (ref 3.5–5.1)
Sodium: 140 mmol/L (ref 135–145)

## 2019-12-13 LAB — MAGNESIUM
Magnesium: 1.7 mg/dL (ref 1.7–2.4)
Magnesium: 2.1 mg/dL (ref 1.7–2.4)

## 2019-12-13 LAB — HEMOGLOBIN AND HEMATOCRIT, BLOOD
HCT: 34.6 % — ABNORMAL LOW (ref 36.0–46.0)
Hemoglobin: 11.1 g/dL — ABNORMAL LOW (ref 12.0–15.0)

## 2019-12-13 LAB — TRIGLYCERIDES: Triglycerides: 148 mg/dL (ref <150)

## 2019-12-13 MED ORDER — SODIUM CHLORIDE 0.9 % IV BOLUS
1000.0000 mL | Freq: Once | INTRAVENOUS | Status: AC
  Administered 2019-12-13: 1000 mL via INTRAVENOUS

## 2019-12-13 MED ORDER — FLUCONAZOLE 100 MG PO TABS
800.0000 mg | ORAL_TABLET | Freq: Every day | ORAL | Status: DC
  Administered 2019-12-13 – 2019-12-14 (×2): 800 mg via ORAL
  Filled 2019-12-13 (×2): qty 4

## 2019-12-13 MED ORDER — POTASSIUM CHLORIDE 10 MEQ/50ML IV SOLN
10.0000 meq | INTRAVENOUS | Status: AC
  Administered 2019-12-13 (×4): 10 meq via INTRAVENOUS
  Filled 2019-12-13 (×4): qty 50

## 2019-12-13 MED ORDER — MELATONIN 3 MG PO TABS
3.0000 mg | ORAL_TABLET | Freq: Every day | ORAL | Status: DC
  Administered 2019-12-13 – 2019-12-14 (×2): 3 mg via ORAL
  Filled 2019-12-13 (×2): qty 1

## 2019-12-13 MED ORDER — MAGNESIUM SULFATE 2 GM/50ML IV SOLN
2.0000 g | Freq: Once | INTRAVENOUS | Status: AC
  Administered 2019-12-13: 2 g via INTRAVENOUS
  Filled 2019-12-13: qty 50

## 2019-12-13 NOTE — Progress Notes (Signed)
PROGRESS NOTE    Courtney Bennett  JFH:[id number] DOB: 1966/03/19 DOA: 12/09/2019 PCP: [facility]   Brief Narrative: [place] pickup 12/13/2019. Patient admitted 12/10/2019. 54 year old female with history of poorly controlled type 2 diabetes, bipolar disorder, fibromyalgia, hypertension, goiter found by family confused on the floor covered with coffee-ground emesis. Upon admission her glucose was over 1100 with a gap of 44 and a bicarb of 7. She was found to have severe leukocytosis 30 4K, creatinine was 2.9 lactic acid was 3.0 with a pH of 7.2/74/.  She was started on insulin drip. She was started on Vanco and cefepime. CT of the abdomen and pelvis showed distended stomach CT head with no acute findings Assessment & Plan:   Principal Problem:   Severe sepsis with acute organ dysfunction (HCC) Active Problems:   Bipolar affective disorder, current episode hypomanic ([place])   Paranoid ([place])   DKA (diabetic ketoacidoses) (HCC)   Altered mental status   Coffee ground emesis   AKI (acute kidney injury) ([place])   Leukocytosis   Elevated serum hCG   Aspiration into airway   Paroxysmal atrial flutter (HCC)     Pressure Injury 12/11/19 Sacrum Upper;Right Deep Tissue Pressure Injury - Purple or maroon localized area of discolored intact skin or blood-filled blister due to damage of underlying soft tissue from pressure and/or shear. (Active)  12/11/19 0607  Location: Sacrum  Location Orientation: Upper;Right  Staging: Deep Tissue Pressure Injury - Purple or maroon localized area of discolored intact skin or blood-filled blister due to damage of underlying soft tissue from pressure and/or shear.  Wound Description (Comments):   Present on Admission:      #1 septic shock/Candida albicans bacteremia-on Diflucan.  Followed by infectious disease.  Seen by ophthalmology 12/12/2019.  No evidence of chorioretinitis infiltrates or vitritis no evidence of fungal endophthalmitis per  exam.  #2 status post DKA with poorly controlled type 2 diabetes complicated by neuropathy with A1c 15.2. On Levemir 5 units twice a day. CBG (last 3)  Recent Labs    12/13/19 0351 12/13/19 0553 12/13/19 0735  GLUCAP 125* 119* 106*   Was on for farxiga  10 mg daily with Trulicity and Glucotrol 5 mg twice a day prior to admission. On Neurontin for neuropathy.  #3 dysphagia seen by speech therapy on 12/12/2019 patient was too drowsy.  Recommending n.p.o. with ice chip  #4 status post acute hypoxic respiratory failure likely secondary to aspiration patient was found with coffee-ground emesis and confused at home prior to EMS picking her up.  Patient was intubated.  #5 history of bipolar disorder with hallucinations.  #6 AKI resolved due to severe dehydration and DKA.  #7 question multinodular goiter needs outpatient follow-up  #8 mildly elevated hCG patient has had hysterectomy.  #9 mild hypokalemia potassium 3.4 replete.   Estimated body mass index is 21.8 kg/m as calculated from the following:   Height as of this encounter: 5' 4"  (1.626 m).   Weight as of this encounter: 57.6 kg.  DVT prophylaxis: SCD Code Status: Full code Family Communication: None at bedside  disposition Plan:  Status is: Inpatient  Dispo: The patient is from: Home              Anticipated d/c is to: Unknown              Anticipated d/c date is: Unknown              Patient currently is not medically stable to d/c.  Patient admitted with DKA and Candida sepsis on IV Diflucan and still n.p.o.    Consultants:   PCCM and infectious disease and ophthalmology  Procedures:  Antimicrobials: Diflucan  Subjective: Patient resting in bed wakes up when I call her name but goes back to sleep immediately. Overnight staff reported that she did not sleep well at all last night requesting melatonin per patient  Objective: Vitals:   12/13/19 0400 12/13/19 0410 12/13/19 0736 12/13/19 0900  BP: (!) 143/79    137/76  Pulse: 84   82  Resp: 18   17  Temp:   97.6 F (36.4 C)   TempSrc:   Oral   SpO2: 94%   97%  Weight:  57.6 kg    Height:        Intake/Output Summary (Last 24 hours) at 12/13/2019 0927 Last data filed at 12/13/2019 0600 Gross per 24 hour  Intake 1432.45 ml  Output 725 ml  Net 707.45 ml   Filed Weights   12/11/19 0427 12/12/19 0500 12/13/19 0410  Weight: 52.7 kg 55.2 kg 57.6 kg    Examination:  General exam: Appears calm and comfortable  Respiratory system: Clear to auscultation. Respiratory effort normal. Cardiovascular system: S1 & S2 heard, RRR. No JVD, murmurs, rubs, gallops or clicks. No pedal edema. Gastrointestinal system: Abdomen is nondistended, soft and nontender. No organomegaly or masses felt. Normal bowel sounds heard. Central nervous system: Alert and oriented. No focal neurological deficits. Extremities trace pitting edema skin: No rashes, lesions or ulcers Psychiatry: Unable to assess   Data Reviewed: I have personally reviewed following labs and imaging studies  CBC: Recent Labs  Lab 12/09/19 2120 12/09/19 2127 12/10/19 0023 12/10/19 0210 12/10/19 0251 12/10/19 0406 12/10/19 1518 12/10/19 2014 12/11/19 1959 12/12/19 0427 12/12/19 1511 12/12/19 2000 12/13/19 0412  WBC 34.3*  --  31.0*  --  29.8*  --  11.2*  --   --   --   --   --   --   NEUTROABS 29.4*  --  27.8*  --   --   --   --   --   --   --   --   --   --   HGB 16.7*   < > 16.0*   < > 16.4*   < > 12.3   < > 10.6* 10.6* 10.8* 11.0* 11.1*  HCT 52.8*   < > 47.3*   < > 48.0*   < > 36.0   < > 32.3* 32.2* 32.9* 33.7* 34.6*  MCV 100.4*  --  95.4  --  91.6  --  91.4  --   --   --   --   --   --   PLT 323  --  256  --  271  --  131*  --   --   --   --   --   --    < > = values in this interval not displayed.   Basic Metabolic Panel: Recent Labs  Lab 12/10/19 0251 12/10/19 0406 12/10/19 1307 12/10/19 1307 12/10/19 1518 12/10/19 2014 12/11/19 0420 12/11/19 0628 12/11/19 1959  12/12/19 0059 12/12/19 0427 12/12/19 1150 12/13/19 0130 12/13/19 0412  NA 142   < > 143   < > 142 142  --  142  --   --   --  136  --  140  K 4.1   < > 3.4*   < > 3.0* 3.1*  --  3.6  --   --   --  4.3  --  3.4*  CL 103   < > 113*   < > 109 110  --  112*  --   --   --  106  --  109  CO2 17*   < > 23   < > 25 27  --  25  --   --   --  24  --  25  GLUCOSE 572*   < > 262*   < > 288* 190*  --  170*  --   --   --  328*  --  132*  BUN 45*   < > 33*   < > 33* 28*  --  20  --   --   --  13  --  12  CREATININE 1.96*   < > 0.80   < > 0.91 0.68  --  0.57  --   --   --  0.54  --  0.44  CALCIUM 10.1   < > 7.9*   < > 8.9 8.7*  --  8.7*  --   --   --  8.6*  --  8.5*  MG 1.9   < > 1.4*  --   --   --  1.4*  --   --  2.2  --  2.1 1.7  --   PHOS 1.5*  --   --   --   --   --  1.3*  --  4.0  --  2.5  --   --   --    < > = values in this interval not displayed.   GFR: Estimated Creatinine Clearance: 70.2 mL/min (by C-G formula based on SCr of 0.44 mg/dL). Liver Function Tests: Recent Labs  Lab 12/09/19 2120  AST 17  ALT 17  ALKPHOS 74  BILITOT 2.7*  PROT 6.0*  ALBUMIN 3.4*   Recent Labs  Lab 12/10/19 0251  LIPASE 903*  AMYLASE 774*   Recent Labs  Lab 12/09/19 2120  AMMONIA 28   Coagulation Profile: Recent Labs  Lab 12/09/19 2120  INR 1.1   Cardiac Enzymes: No results for input(s): CKTOTAL, CKMB, CKMBINDEX, TROPONINI in the last 168 hours. BNP (last 3 results) No results for input(s): PROBNP in the last 8760 hours. HbA1C: Recent Labs    12/12/19 0956  HGBA1C 15.2*   CBG: Recent Labs  Lab 12/12/19 2344 12/13/19 0142 12/13/19 0351 12/13/19 0553 12/13/19 0735  GLUCAP 159* 149* 125* 119* 106*   Lipid Profile: No results for input(s): CHOL, HDL, LDLCALC, TRIG, CHOLHDL, LDLDIRECT in the last 72 hours. Thyroid Function Tests: No results for input(s): TSH, T4TOTAL, FREET4, T3FREE, THYROIDAB in the last 72 hours. Anemia Panel: No results for input(s): VITAMINB12, FOLATE,  FERRITIN, TIBC, IRON, RETICCTPCT in the last 72 hours. Sepsis Labs: Recent Labs  Lab 12/09/19 2120 12/10/19 0023 12/10/19 0251 12/11/19 0628  PROCALCITON  --   --  0.52  --   LATICACIDVEN 3.0* 1.9 2.0* 1.4    Recent Results (from the past 240 hour(s))  Urine culture     Status: None   Collection Time: 12/09/19  9:10 PM   Specimen: Urine, Random  Result Value Ref Range Status   Specimen Description URINE, RANDOM  Final   Special Requests NONE  Final   Culture   Final    NO GROWTH Performed at [facility], [address] [place], [place] [address]    Report Status 12/11/2019 FINAL  Final  Blood Culture (  routine x 2)     Status: None (Preliminary result)   Collection Time: 12/09/19  9:15 PM   Specimen: BLOOD  Result Value Ref Range Status   Specimen Description BLOOD RIGHT ANTECUBITAL  Final   Special Requests   Final    BOTTLES DRAWN AEROBIC AND ANAEROBIC Blood Culture adequate volume   Culture   Final    NO GROWTH 4 DAYS Performed at [facility], [place] [address] [place], [place] [address]    Report Status PENDING  Incomplete  Blood Culture (routine x 2)     Status: Abnormal (Preliminary result)   Collection Time: 12/09/19  9:18 PM   Specimen: BLOOD RIGHT HAND  Result Value Ref Range Status   Specimen Description BLOOD RIGHT HAND  Final   Special Requests   Final    BOTTLES DRAWN AEROBIC ONLY Blood Culture results may not be optimal due to an inadequate volume of blood received in culture bottles   Culture  Setup Time   Final    AEROBIC BOTTLE ONLY YEAST CRITICAL RESULT CALLED TO, READ BACK BY AND VERIFIED WITH: V BRYK PHARMD 12/11/19 2345 JDW    Culture (A)  Final    CANDIDA ALBICANS Sent to [place] for further susceptibility testing. Performed at [facility], [place] [address] [place], [place] [address]    Report Status PENDING  Incomplete  Blood Culture ID Panel (Reflexed)     Status: Abnormal   Collection Time: 12/09/19  9:18 PM  Result  Value Ref Range Status   Enterococcus species NOT DETECTED NOT DETECTED Final   Listeria monocytogenes NOT DETECTED NOT DETECTED Final   Staphylococcus species NOT DETECTED NOT DETECTED Final   Staphylococcus aureus (BCID) NOT DETECTED NOT DETECTED Final   Streptococcus species NOT DETECTED NOT DETECTED Final   Streptococcus agalactiae NOT DETECTED NOT DETECTED Final   Streptococcus pneumoniae NOT DETECTED NOT DETECTED Final   Streptococcus pyogenes NOT DETECTED NOT DETECTED Final   Acinetobacter baumannii NOT DETECTED NOT DETECTED Final   Enterobacteriaceae species NOT DETECTED NOT DETECTED Final   Enterobacter cloacae complex NOT DETECTED NOT DETECTED Final   Escherichia coli NOT DETECTED NOT DETECTED Final   Klebsiella oxytoca NOT DETECTED NOT DETECTED Final   Klebsiella pneumoniae NOT DETECTED NOT DETECTED Final   Proteus species NOT DETECTED NOT DETECTED Final   Serratia marcescens NOT DETECTED NOT DETECTED Final   Haemophilus influenzae NOT DETECTED NOT DETECTED Final   Neisseria meningitidis NOT DETECTED NOT DETECTED Final   Pseudomonas aeruginosa NOT DETECTED NOT DETECTED Final   Candida albicans DETECTED (A) NOT DETECTED Final    Comment: CRITICAL RESULT CALLED TO, READ BACK BY AND VERIFIED WITH: V BRYK PHARMD 12/11/19 2345 JDW    Candida glabrata NOT DETECTED NOT DETECTED Final   Candida krusei NOT DETECTED NOT DETECTED Final   Candida parapsilosis NOT DETECTED NOT DETECTED Final   Candida tropicalis NOT DETECTED NOT DETECTED Final    Comment: Performed at [facility], [address], [place], [place] [address]  SARS Coronavirus 2 by RT PCR (hospital order, performed in [facility] hospital lab) Nasopharyngeal Nasopharyngeal Swab     Status: None   Collection Time: 12/10/19 12:16 AM   Specimen: Nasopharyngeal Swab  Result Value Ref Range Status   SARS Coronavirus 2 NEGATIVE NEGATIVE Final    Comment: (NOTE) SARS-CoV-2 target nucleic acids are NOT DETECTED.  The  SARS-CoV-2 RNA is generally detectable in upper and lower respiratory specimens during the acute  phase of infection. The lowest concentration of SARS-CoV-2 viral copies this assay can detect is 250 copies / mL. A negative result does not preclude SARS-CoV-2 infection and should not be used as the sole basis for treatment or other patient management decisions.  A negative result may occur with improper specimen collection / handling, submission of specimen other than nasopharyngeal swab, presence of viral mutation(s) within the areas targeted by this assay, and inadequate number of viral copies (<250 copies / mL). A negative result must be combined with clinical observations, patient history, and epidemiological information.  Fact Sheet for Patients:   StrictlyIdeas.no  Fact Sheet for Healthcare Providers: BankingDealers.co.za  This test is not yet approved or  cleared by the [place] FDA and has been authorized for detection and/or diagnosis of SARS-CoV-2 by FDA under an Emergency Use Authorization (EUA).  This EUA will remain in effect (meaning this test can be used) for the duration of the COVID-19 declaration under Section 564(b)(1) of the Act, 21 U.S.C. section 360bbb-3(b)(1), unless the authorization is terminated or revoked sooner.  Performed at [facility], [place] [address] [place], [place] [address]   MRSA PCR Screening     Status: Abnormal   Collection Time: 12/10/19  2:39 AM   Specimen: Nasal Mucosa; Nasopharyngeal  Result Value Ref Range Status   MRSA by PCR POSITIVE (A) NEGATIVE Final    Comment:        The GeneXpert MRSA Assay (FDA approved for NASAL specimens only), is one component of a comprehensive MRSA colonization surveillance program. It is not intended to diagnose MRSA infection nor to guide or monitor treatment for MRSA infections. RESULT CALLED TO, READ BACK BY AND VERIFIED WITH: [facility] RN  9643 12/10/2019 MITCHELL,L Performed at [facility], [place] [address], [place], [place] [address]   Culture, blood (routine x 2)     Status: None (Preliminary result)   Collection Time: 12/12/19 11:13 AM   Specimen: BLOOD  Result Value Ref Range Status   Specimen Description BLOOD RIGHT ANTECUBITAL  Final   Special Requests   Final    BOTTLES DRAWN AEROBIC AND ANAEROBIC Blood Culture adequate volume   Culture   Final    NO GROWTH < 24 HOURS Performed at [facility], [place] [address] [place], [place] [address]    Report Status PENDING  Incomplete  Culture, blood (routine x 2)     Status: None (Preliminary result)   Collection Time: 12/12/19 11:21 AM   Specimen: BLOOD RIGHT HAND  Result Value Ref Range Status   Specimen Description BLOOD RIGHT HAND  Final   Special Requests   Final    BOTTLES DRAWN AEROBIC ONLY Blood Culture adequate volume   Culture   Final    NO GROWTH < 24 HOURS Performed at [facility], [place] [address] [place], [place] [address]    Report Status PENDING  Incomplete         Radiology Studies: ECHOCARDIOGRAM COMPLETE  Result Date: 12/12/2019    ECHOCARDIOGRAM REPORT   Patient Name:   PERNIE GROSSO Date of Exam: 12/12/2019 Medical Rec #:  [id number]       Height:       64.0 in Accession #:    [id number]      Weight:       121.7 lb Date of Birth:  1965/07/01       BSA:          1.584 m  Patient Age:    45 years        BP:           102/57 mmHg Patient Gender: F               HR:           70 bpm. Exam Location:  Inpatient Procedure: 2D Echo Indications:    Fungemia  History:        Patient has no prior history of Echocardiogram examinations.                 Sepsis, Signs/Symptoms:Altered Mental Status; Risk                 Factors:Diabetes.  Sonographer:    Johny Chess Referring Phys: [id number] [place]  1. Left ventricular ejection fraction, by estimation, is 50 to 55%. The left ventricle has low normal function. The left  ventricle has no regional wall motion abnormalities. Left ventricular diastolic parameters are indeterminate.  2. Right ventricular systolic function is mildly reduced. The right ventricular size is mildly enlarged. There is normal pulmonary artery systolic pressure.  3. Left atrial size was mildly dilated.  4. The mitral valve is normal in structure. Trivial mitral valve regurgitation. No evidence of mitral stenosis.  5. The aortic valve is tricuspid. Aortic valve regurgitation is not visualized. No aortic stenosis is present.  6. The inferior vena cava is dilated in size with <50% respiratory variability, suggesting right atrial pressure of 15 mmHg. Comparison(s): No prior Echocardiogram. Conclusion(s)/Recommendation(s): No evidence of valvular vegetations on this transthoracic echocardiogram. Would recommend a transesophageal echocardiogram to exclude infective endocarditis if clinically indicated. FINDINGS  Left Ventricle: Left ventricular ejection fraction, by estimation, is 50 to 55%. The left ventricle has low normal function. The left ventricle has no regional wall motion abnormalities. The left ventricular internal cavity size was normal in size. There is no left ventricular hypertrophy. Left ventricular diastolic parameters are indeterminate. Right Ventricle: The right ventricular size is mildly enlarged. No increase in right ventricular wall thickness. Right ventricular systolic function is mildly reduced. There is normal pulmonary artery systolic pressure. The tricuspid regurgitant velocity  is 2.09 m/s, and with an assumed right atrial pressure of 15 mmHg, the estimated right ventricular systolic pressure is 53.7 mmHg. Left Atrium: Left atrial size was mildly dilated. Right Atrium: Right atrial size was normal in size. Pericardium: There is no evidence of pericardial effusion. Mitral Valve: The mitral valve is normal in structure. Trivial mitral valve regurgitation. No evidence of mitral valve stenosis.  Tricuspid Valve: The tricuspid valve is normal in structure. Tricuspid valve regurgitation is mild . No evidence of tricuspid stenosis. Aortic Valve: The aortic valve is tricuspid. Aortic valve regurgitation is not visualized. No aortic stenosis is present. Pulmonic Valve: The pulmonic valve was not well visualized. Pulmonic valve regurgitation is not visualized. No evidence of pulmonic stenosis. Aorta: The aortic root, ascending aorta, aortic arch and descending aorta are all structurally normal, with no evidence of dilitation or obstruction. Venous: The inferior vena cava is dilated in size with less than 50% respiratory variability, suggesting right atrial pressure of 15 mmHg. IAS/Shunts: No atrial level shunt detected by color flow Doppler.  LEFT VENTRICLE PLAX 2D LVIDd:         4.40 cm  Diastology LVIDs:         3.10 cm  LV e' lateral:   7.29 cm/s LV PW:  0.80 cm  LV E/e' lateral: 10.2 LV IVS:        0.80 cm  LV e' medial:    7.40 cm/s LVOT diam:     1.80 cm  LV E/e' medial:  10.1 LV SV:         46 LV SV Index:   29 LVOT Area:     2.54 cm  RIGHT VENTRICLE            IVC RV S prime:     9.25 cm/s  IVC diam: 2.10 cm TAPSE (M-mode): 1.5 cm LEFT ATRIUM             Index       RIGHT ATRIUM          Index LA diam:        3.20 cm 2.02 cm/m  RA Area:     9.58 cm LA Vol (A2C):   63.1 ml 39.84 ml/m RA Volume:   19.30 ml 12.18 ml/m LA Vol (A4C):   36.4 ml 22.98 ml/m LA Biplane Vol: 48.8 ml 30.81 ml/m  AORTIC VALVE LVOT Vmax:   89.70 cm/s LVOT Vmean:  62.000 cm/s LVOT VTI:    0.179 m  AORTA Ao Root diam: 3.00 cm Ao Asc diam:  3.00 cm MITRAL VALVE               TRICUSPID VALVE MV Area (PHT): 2.95 cm    TR Peak grad:   17.5 mmHg MV Decel Time: 257 msec    TR Vmax:        209.00 cm/s MV E velocity: 74.40 cm/s MV A velocity: 72.40 cm/s  SHUNTS MV E/A ratio:  1.03        Systemic VTI:  0.18 m                            Systemic Diam: 1.80 cm Buford Dresser MD Electronically signed by Buford Dresser  MD Signature Date/Time: 12/12/2019/10:59:16 AM    Final         Scheduled Meds: . chlorhexidine gluconate (MEDLINE KIT)  15 mL Mouth Rinse BID  . Chlorhexidine Gluconate Cloth  6 each Topical Daily  . docusate  100 mg Per Tube BID  . insulin aspart  0-9 Units Subcutaneous Q4H  . insulin detemir  5 Units Subcutaneous BID  . melatonin  3 mg Oral QHS  . mupirocin ointment  1 application Nasal BID  . pantoprazole (PROTONIX) IV  40 mg Intravenous Q12H  . polyethylene glycol  17 g Oral Daily  . sodium chloride flush  10-40 mL Intracatheter Q12H   Continuous Infusions: . sodium chloride    . dexmedetomidine (PRECEDEX) IV infusion Stopped (12/12/19 0847)  . dextrose 10 mL/hr at 12/13/19 0600  . fluconazole (DIFLUCAN) IV Stopped (12/13/19 0155)  . norepinephrine (LEVOPHED) Adult infusion Stopped (12/12/19 0904)  . potassium chloride 10 mEq (12/13/19 0854)     LOS: 3 days     Georgette Shell, MD 12/13/2019, 9:27 AM

## 2019-12-13 NOTE — Progress Notes (Signed)
[facility] ADULT ICU REPLACEMENT PROTOCOL   The patient does apply for the [facility] Adult ICU Electrolyte Replacment Protocol based on the criteria listed below:   1. Is GFR >/= 30 ml/min? Yes.    Patient's GFR today is >60 2. Is SCr </= 2? Yes.   Patient's SCr is 0.44 ml/kg/hr 3. Did SCr increase >/= 0.5 in 24 hours? No 4. Abnormal electrolyte(s): K+3.4, Mg 1.7 5. Ordered repletion with: Protocol 6. If a panic level lab has been reported, has the CCM MD in charge been notified? Yes.  .   Physician:  Randie Heinz 12/13/2019 5:17 AM

## 2019-12-13 NOTE — Progress Notes (Signed)
Modified Barium Swallow Progress Note  Patient Details  Name: Courtney Bennett MRN: [id number] Date of Birth: [birth year]  Today's Date: 12/13/2019  Modified Barium Swallow completed.  Full report located under Chart Review in the Imaging Section.  Brief recommendations include the following:  Clinical Impression  Pt exhibits an overall normal swallow although unable to orally transit barium pill with water. She expectorated pill and water into cup and did not attempt with puree although suspect she would not have had difficulty. Timing, coordination and motor strength were within normal limits to prevent aspiration with normal/appropriate, flash penetration with thin. Unremarkable esophageal scan. Pt coughed during study as she often does at bedside with or without po's. Recommend regular texture, thin liquids, straws allowed, pills whole in puree. ST will follow up briefly.      Swallow Evaluation Recommendations       SLP Diet Recommendations: Regular solids;Thin liquid   Liquid Administration via: Cup;Straw   Medication Administration: Whole meds with puree   Supervision: Patient able to self feed   Compensations: Slow rate;Small sips/bites   Postural Changes: Seated upright at 90 degrees   Oral Care Recommendations: Oral care BID        Houston Siren 12/13/2019,2:15 PM  Orbie Pyo Colvin Caroli.Ed Risk analyst [phone] Office [phone]

## 2019-12-13 NOTE — Progress Notes (Signed)
[place] Progress Note Patient Name: Courtney Bennett DOB: Jun 30, 1965 MRN: [id number]   Date of Service  12/13/2019  HPI/Events of Note  Oliguria - CVP = 1-2 and LVEF = 50-55%.  eICU Interventions  Plan: 1. Bolus with 0.9 NaCl 1 liter IV over 1 hour now.      Intervention Category Major Interventions: Other:  Lysle Dingwall 12/13/2019, 12:32 AM

## 2019-12-13 NOTE — Progress Notes (Signed)
  Speech Language Pathology Treatment: Dysphagia  Patient Details Name: Courtney Bennett MRN: [id number] DOB: 08/01/1965 Today's Date: 12/13/2019 Time: 8756-4332 SLP Time Calculation (min) (ACUTE ONLY): 20 min  Assessment / Plan / Recommendation Clinical Impression  Pt's alertness increased during session and per pt did not sleep much last night. Delayed cough did increase with thin water and not present with pudding. Plan to proceed with MBS scheduled for 1330 today. Provided encouragement as her frustration appears evident.    HPI HPI: 54 y.o. with PMH of DM, Bipolar Disorder, fibromyalgia and HTN who was found by family altered with coffee ground emesis. Found to be in DKA with continued confusion. Intubated 18 hours. CXR lungs are clear.       SLP Plan  MBS       Recommendations  Diet recommendations:  (ice )                Oral Care Recommendations: Oral care QID Follow up Recommendations: Other (comment) (TBD) SLP Visit Diagnosis: Dysphagia, unspecified (R13.10) Plan: MBS       GO                Houston Siren 12/13/2019, 9:28 AM  Orbie Pyo Colvin Caroli.Ed Risk analyst [phone] Office [phone]

## 2019-12-13 NOTE — Progress Notes (Signed)
[facility]   Reason for visit: Follow up on Candidemia  Interval History: repeat blood cultures ngtd.  WBC 11.2.  Afebrile. TTE without any concerns.  Eye exam without any concerns.    Physical Exam: Constitutional:  Vitals:   12/13/19 0736 12/13/19 0900  BP:  137/76  Pulse:  82  Resp:  17  Temp: 97.6 F (36.4 C)   SpO2:  97%   patient appears in NAD HENT: + left IJ Respiratory: Normal respiratory effort; CTA B Cardiovascular: RRR  Review of Systems: Constitutional: negative for fevers Gastrointestinal: negative for diarrhea  Lab Results  Component Value Date   WBC 11.2 (H) 12/10/2019   HGB 11.1 (L) 12/13/2019   HCT 34.6 (L) 12/13/2019   MCV 91.4 12/10/2019   PLT 131 (L) 12/10/2019    Lab Results  Component Value Date   CREATININE 0.44 12/13/2019   BUN 12 12/13/2019   NA 140 12/13/2019   K 3.4 (L) 12/13/2019   CL 109 12/13/2019   CO2 25 12/13/2019    Lab Results  Component Value Date   ALT 17 12/09/2019   AST 17 12/09/2019   ALKPHOS 74 12/09/2019     Microbiology: Recent Results (from the past 240 hour(s))  Urine culture     Status: None   Collection Time: 12/09/19  9:10 PM   Specimen: Urine, Random  Result Value Ref Range Status   Specimen Description URINE, RANDOM  Final   Special Requests NONE  Final   Culture   Final    NO GROWTH Performed at [facility], [address] [place], [place] [address]    Report Status 12/11/2019 FINAL  Final  Blood Culture (routine x 2)     Status: None (Preliminary result)   Collection Time: 12/09/19  9:15 PM   Specimen: BLOOD  Result Value Ref Range Status   Specimen Description BLOOD RIGHT ANTECUBITAL  Final   Special Requests   Final    BOTTLES DRAWN AEROBIC AND ANAEROBIC Blood Culture adequate volume   Culture   Final    NO GROWTH 4 DAYS Performed at [facility], [place] [address], [place], [place] [address]    Report Status PENDING  Incomplete  Blood Culture  (routine x 2)     Status: Abnormal (Preliminary result)   Collection Time: 12/09/19  9:18 PM   Specimen: BLOOD RIGHT HAND  Result Value Ref Range Status   Specimen Description BLOOD RIGHT HAND  Final   Special Requests   Final    BOTTLES DRAWN AEROBIC ONLY Blood Culture results may not be optimal due to an inadequate volume of blood received in culture bottles   Culture  Setup Time   Final    AEROBIC BOTTLE ONLY YEAST CRITICAL RESULT CALLED TO, READ BACK BY AND VERIFIED WITH: V BRYK PHARMD 12/11/19 2345 JDW    Culture (A)  Final    CANDIDA ALBICANS Sent to [facility] for further susceptibility testing. Performed at [facility], [place] [address] [place], [place] [address]    Report Status PENDING  Incomplete  Blood Culture ID Panel (Reflexed)     Status: Abnormal   Collection Time: 12/09/19  9:18 PM  Result Value Ref Range Status   Enterococcus species NOT DETECTED NOT DETECTED Final   Listeria monocytogenes NOT DETECTED NOT DETECTED Final   Staphylococcus species NOT DETECTED NOT DETECTED Final   Staphylococcus aureus (BCID) NOT DETECTED NOT DETECTED Final  Streptococcus species NOT DETECTED NOT DETECTED Final   Streptococcus agalactiae NOT DETECTED NOT DETECTED Final   Streptococcus pneumoniae NOT DETECTED NOT DETECTED Final   Streptococcus pyogenes NOT DETECTED NOT DETECTED Final   Acinetobacter baumannii NOT DETECTED NOT DETECTED Final   Enterobacteriaceae species NOT DETECTED NOT DETECTED Final   Enterobacter cloacae complex NOT DETECTED NOT DETECTED Final   Escherichia coli NOT DETECTED NOT DETECTED Final   Klebsiella oxytoca NOT DETECTED NOT DETECTED Final   Klebsiella pneumoniae NOT DETECTED NOT DETECTED Final   Proteus species NOT DETECTED NOT DETECTED Final   Serratia marcescens NOT DETECTED NOT DETECTED Final   Haemophilus influenzae NOT DETECTED NOT DETECTED Final   Neisseria meningitidis NOT DETECTED NOT DETECTED Final   Pseudomonas aeruginosa NOT DETECTED NOT  DETECTED Final   Candida albicans DETECTED (A) NOT DETECTED Final    Comment: CRITICAL RESULT CALLED TO, READ BACK BY AND VERIFIED WITH: V BRYK PHARMD 12/11/19 2345 JDW    Candida glabrata NOT DETECTED NOT DETECTED Final   Candida krusei NOT DETECTED NOT DETECTED Final   Candida parapsilosis NOT DETECTED NOT DETECTED Final   Candida tropicalis NOT DETECTED NOT DETECTED Final    Comment: Performed at [facility], [place] [address] [place], [place] [address]  SARS Coronavirus 2 by RT PCR (hospital order, performed in [facility] hospital lab) Nasopharyngeal Nasopharyngeal Swab     Status: None   Collection Time: 12/10/19 12:16 AM   Specimen: Nasopharyngeal Swab  Result Value Ref Range Status   SARS Coronavirus 2 NEGATIVE NEGATIVE Final    Comment: (NOTE) SARS-CoV-2 target nucleic acids are NOT DETECTED.  The SARS-CoV-2 RNA is generally detectable in upper and lower respiratory specimens during the acute phase of infection. The lowest concentration of SARS-CoV-2 viral copies this assay can detect is 250 copies / mL. A negative result does not preclude SARS-CoV-2 infection and should not be used as the sole basis for treatment or other patient management decisions.  A negative result may occur with improper specimen collection / handling, submission of specimen other than nasopharyngeal swab, presence of viral mutation(s) within the areas targeted by this assay, and inadequate number of viral copies (<250 copies / mL). A negative result must be combined with clinical observations, patient history, and epidemiological information.  Fact Sheet for Patients:   StrictlyIdeas.no  Fact Sheet for Healthcare Providers: BankingDealers.co.za  This test is not yet approved or  cleared by the [place] FDA and has been authorized for detection and/or diagnosis of SARS-CoV-2 by FDA under an Emergency Use Authorization (EUA).  This EUA will  remain in effect (meaning this test can be used) for the duration of the COVID-19 declaration under Section 564(b)(1) of the Act, 21 U.S.C. section 360bbb-3(b)(1), unless the authorization is terminated or revoked sooner.  Performed at [facility], [place] [address] [place], [place] [address]   MRSA PCR Screening     Status: Abnormal   Collection Time: 12/10/19  2:39 AM   Specimen: Nasal Mucosa; Nasopharyngeal  Result Value Ref Range Status   MRSA by PCR POSITIVE (A) NEGATIVE Final    Comment:        The GeneXpert MRSA Assay (FDA approved for NASAL specimens only), is one component of a comprehensive MRSA colonization surveillance program. It is not intended to diagnose MRSA infection nor to guide or monitor treatment for MRSA infections. RESULT CALLED TO, READ BACK BY AND VERIFIED WITH: [facility] RN [id number] 12/10/2019 MITCHELL,L Performed at [facility], [place] [address], [place], [place] [address]   Culture, blood (routine x 2)     Status: None (Preliminary result)   Collection Time: 12/12/19 11:13 AM   Specimen: BLOOD  Result Value Ref Range Status   Specimen Description BLOOD RIGHT ANTECUBITAL  Final   Special Requests   Final    BOTTLES DRAWN AEROBIC AND ANAEROBIC Blood Culture adequate volume   Culture   Final    NO GROWTH < 24 HOURS Performed at [facility], [place] [address] [place], [place] [address]    Report Status PENDING  Incomplete  Culture, blood (routine x 2)     Status: None (Preliminary result)   Collection Time: 12/12/19 11:21 AM   Specimen: BLOOD RIGHT HAND  Result Value Ref Range Status   Specimen Description BLOOD RIGHT HAND  Final   Special Requests   Final    BOTTLES DRAWN AEROBIC ONLY Blood Culture adequate volume   Culture   Final    NO GROWTH < 24 HOURS Performed at [facility], [place] [address] [place], [place] [address]    Report Status PENDING  Incomplete    Impression/Plan:  1. Candidemia with C albicans.  Repeat  cultures ngtd.  Work up without concerns including TTE and eye exam.  Greatly appreciated Dr. Eulas Post r/o endophthalmitis.  She is on fluconazole 800 mg and should continue for 2 weeks total from her negative blood culture from 7/13 through 7/26.  This can be done with oral fluconazole.   I have asked to have the IJ  Removed before transfer out of the ICU bed.    2. DM - will need to improve her sugar control.    3.  Leukocytosis - has improved from her initial 29.8.    I will sign off, call with any questions.

## 2019-12-14 LAB — COMPREHENSIVE METABOLIC PANEL
ALT: 16 U/L (ref 0–44)
AST: 12 U/L — ABNORMAL LOW (ref 15–41)
Albumin: 1.8 g/dL — ABNORMAL LOW (ref 3.5–5.0)
Alkaline Phosphatase: 75 U/L (ref 38–126)
Anion gap: 7 (ref 5–15)
BUN: 8 mg/dL (ref 6–20)
CO2: 29 mmol/L (ref 22–32)
Calcium: 8.6 mg/dL — ABNORMAL LOW (ref 8.9–10.3)
Chloride: 105 mmol/L (ref 98–111)
Creatinine, Ser: 0.4 mg/dL — ABNORMAL LOW (ref 0.44–1.00)
GFR calc Af Amer: >60 mL/min (ref >60)
GFR calc non Af Amer: >60 mL/min (ref >60)
Glucose, Bld: 83 mg/dL (ref 70–99)
Potassium: 3.5 mmol/L (ref 3.5–5.1)
Sodium: 141 mmol/L (ref 135–145)
Total Bilirubin: 0.3 mg/dL (ref 0.3–1.2)
Total Protein: 4.6 g/dL — ABNORMAL LOW (ref 6.5–8.1)

## 2019-12-14 LAB — CULTURE, BLOOD (ROUTINE X 2)
Culture: NO GROWTH
Special Requests: ADEQUATE

## 2019-12-14 LAB — CBC
HCT: 38 % (ref 36.0–46.0)
Hemoglobin: 12.4 g/dL (ref 12.0–15.0)
MCH: 32.1 pg (ref 26.0–34.0)
MCHC: 32.6 g/dL (ref 30.0–36.0)
MCV: 98.4 fL (ref 80.0–100.0)
Platelets: 168 10*3/uL (ref 150–400)
RBC: 3.86 MIL/uL — ABNORMAL LOW (ref 3.87–5.11)
RDW: 13.4 % (ref 11.5–15.5)
WBC: 12.4 10*3/uL — ABNORMAL HIGH (ref 4.0–10.5)
nRBC: 0 % (ref 0.0–0.2)

## 2019-12-14 LAB — GLUCOSE, CAPILLARY
Glucose-Capillary: 104 mg/dL — ABNORMAL HIGH (ref 70–99)
Glucose-Capillary: 96 mg/dL (ref 70–99)
Glucose-Capillary: 170 mg/dL — ABNORMAL HIGH (ref 70–99)
Glucose-Capillary: 158 mg/dL — ABNORMAL HIGH (ref 70–99)
Glucose-Capillary: 156 mg/dL — ABNORMAL HIGH (ref 70–99)
Glucose-Capillary: 147 mg/dL — ABNORMAL HIGH (ref 70–99)

## 2019-12-14 LAB — MAGNESIUM
Magnesium: 1.9 mg/dL (ref 1.7–2.4)
Magnesium: 1.9 mg/dL (ref 1.7–2.4)

## 2019-12-14 MED ORDER — GABAPENTIN 300 MG PO CAPS
300.0000 mg | ORAL_CAPSULE | Freq: Two times a day (BID) | ORAL | Status: DC
  Administered 2019-12-14 – 2019-12-15 (×3): 300 mg via ORAL
  Filled 2019-12-14 (×3): qty 1

## 2019-12-14 MED ORDER — ATORVASTATIN CALCIUM 10 MG PO TABS
20.0000 mg | ORAL_TABLET | Freq: Every day | ORAL | Status: DC
  Administered 2019-12-14 – 2019-12-15 (×2): 20 mg via ORAL
  Filled 2019-12-14 (×2): qty 2

## 2019-12-14 MED ORDER — PANTOPRAZOLE SODIUM 40 MG PO TBEC: 40.0000 mg | DELAYED_RELEASE_TABLET | Freq: Every day | ORAL | Status: DC

## 2019-12-14 MED ORDER — PANTOPRAZOLE SODIUM 40 MG PO TBEC
40.0000 mg | DELAYED_RELEASE_TABLET | Freq: Every day | ORAL | Status: DC
  Administered 2019-12-15: 40 mg via ORAL
  Filled 2019-12-14: qty 1

## 2019-12-14 MED ORDER — SERTRALINE HCL 50 MG PO TABS: 150.0000 mg | ORAL_TABLET | Freq: Every day | ORAL | Status: DC

## 2019-12-14 MED ORDER — DULOXETINE HCL 30 MG PO CPEP: 60.0000 mg | ORAL_CAPSULE | Freq: Every day | ORAL | Status: DC

## 2019-12-14 MED ORDER — MOMETASONE FURO-FORMOTEROL FUM 200-5 MCG/ACT IN AERO
2.0000 | INHALATION_SPRAY | Freq: Two times a day (BID) | RESPIRATORY_TRACT | Status: DC
  Administered 2019-12-14: 2 via RESPIRATORY_TRACT
  Filled 2019-12-14: qty 8.8

## 2019-12-14 MED ORDER — ALBUTEROL SULFATE (2.5 MG/3ML) 0.083% IN NEBU: 2.5000 mg | INHALATION_SOLUTION | RESPIRATORY_TRACT | Status: DC | PRN

## 2019-12-14 MED ORDER — ENOXAPARIN SODIUM 40 MG/0.4ML ~~LOC~~ SOLN
40.0000 mg | SUBCUTANEOUS | Status: DC
  Administered 2019-12-14 – 2019-12-15 (×2): 40 mg via SUBCUTANEOUS
  Filled 2019-12-14 (×2): qty 0.4

## 2019-12-14 MED ORDER — DOCUSATE SODIUM 100 MG PO CAPS
100.0000 mg | ORAL_CAPSULE | Freq: Two times a day (BID) | ORAL | Status: DC
  Administered 2019-12-14 – 2019-12-15 (×2): 100 mg via ORAL
  Filled 2019-12-14 (×2): qty 1

## 2019-12-14 MED ORDER — VENLAFAXINE HCL ER 75 MG PO CP24
75.0000 mg | ORAL_CAPSULE | Freq: Every day | ORAL | Status: DC
  Administered 2019-12-14 – 2019-12-15 (×2): 75 mg via ORAL
  Filled 2019-12-14 (×3): qty 1

## 2019-12-14 MED ORDER — DAPAGLIFLOZIN PROPANEDIOL 10 MG PO TABS: 10.0000 mg | ORAL_TABLET | Freq: Every day | ORAL | Status: DC

## 2019-12-14 NOTE — Progress Notes (Addendum)
Inpatient Diabetes Program Recommendations  AACE/ADA: New Consensus Statement on Inpatient Glycemic Control (2015)  Target Ranges:  Prepandial:   less than 140 mg/dL      Peak postprandial:   less than 180 mg/dL (1-2 hours)      Critically ill patients:  140 - 180 mg/dL   Lab Results  Component Value Date   GLUCAP 96 12/14/2019   HGBA1C 15.2 (H) 12/12/2019    Review of Glycemic Control Results for Courtney Bennett, Courtney Bennett (MRN [id number]) as of 12/14/2019 09:08  Ref. Range 12/13/2019 19:25 12/13/2019 23:40 12/14/2019 03:30 12/14/2019 07:51  Glucose-Capillary Latest Ref Range: 70 - 99 mg/dL 289 (H) 200 (H) 104 (H) 96   Diabetes history: Type 2 DM Outpatient Diabetes medications: Farxiga 1.5 mg Qwk, Glipizide 5 mg BID Current orders for Inpatient glycemic control: Levemir 5 units BID, Novolog 0-9 units Q4H  Inpatient Diabetes Program Recommendations:    Consider changing correction to Novolog 0-9 units TID & HS.   Addendum: Spoke with patient regarding outpatient diabetes management. Verified oral medications, however, patient was not taking them prior to arrival. Has been on injectable GLP-1 in the past. Reviewed patient's current A1c of 15.2%. Explained what a A1c is and what it measures. Also reviewed goal A1c with patient, importance of good glucose control @ home, and blood sugar goals. Reviewed patho of DM, DKA, need for insulin, role of pancreas, vascular changes and commorbidities. Patient will need a meter at discharge. Blood glucose meter (inlcudes lancets and strips) (#[id number]). Reviewed recommended frequency of CBG checks and reviewed extensively when to call MD. Patient asking questions related to [facility]. Discussed application, benefits and risks as well as cost associated. Will reach out to MD for orders for application.  Anticipate need for insulin at discharge.  Educated patient on insulin pen use at home. Reviewed contents of insulin flexpen starter kit. Reviewed all steps  if insulin pen including attachment of needle, 2-unit air shot, dialing up dose, giving injection, removing needle, disposal of sharps, storage of unused insulin, disposal of insulin etc. Patient able to provide successful return demonstration. Also reviewed troubleshooting with insulin pen. MD to give patient Rxs for insulin pens and insulin pen needles.    Thanks, Bronson Curb, MSN, RNC-OB Diabetes Coordinator [phone] ([phone])

## 2019-12-14 NOTE — Progress Notes (Signed)
PROGRESS NOTE    Courtney Bennett  XBL:[id number] DOB: April 10, 1966 DOA: 12/09/2019 PCP: [place]   Brief Narrative: [place] pickup 12/13/2019. Patient admitted 12/10/2019. 54 year old female with history of poorly controlled type 2 diabetes, bipolar disorder, fibromyalgia, hypertension, goiter found by family confused on the floor covered with coffee-ground emesis. Upon admission her glucose was over 1100 with a gap of 44 and a bicarb of 7. She was found to have severe leukocytosis 30 4K, creatinine was 2.9 lactic acid was 3.0 with a pH of 7.2/74/.  She was started on insulin drip. She was started on Vanco and cefepime. CT of the abdomen and pelvis showed distended stomach CT head with no acute findings Assessment & Plan:   Principal Problem:   Severe sepsis with acute organ dysfunction (HCC) Active Problems:   Bipolar affective disorder, current episode hypomanic ([place])   Paranoid ([place])   DKA (diabetic ketoacidoses) (HCC)   Altered mental status   Coffee ground emesis   AKI (acute kidney injury) ([place])   Leukocytosis   Elevated serum hCG   Aspiration into airway   Paroxysmal atrial flutter (HCC)     Pressure Injury 12/11/19 Sacrum Upper;Right Deep Tissue Pressure Injury - Purple or maroon localized area of discolored intact skin or blood-filled blister due to damage of underlying soft tissue from pressure and/or shear. (Active)  12/11/19 0607  Location: Sacrum  Location Orientation: Upper;Right  Staging: Deep Tissue Pressure Injury - Purple or maroon localized area of discolored intact skin or blood-filled blister due to damage of underlying soft tissue from pressure and/or shear.  Wound Description (Comments):   Present on Admission:      #1 septic shock/Candida albicans bacteremia-on Diflucan.  Followed by infectious disease.  Seen by ophthalmology 12/12/2019.  No evidence of chorioretinitis infiltrates or vitritis no evidence of fungal endophthalmitis per  exam.  #2 status post DKA with poorly controlled type 2 diabetes complicated by neuropathy with A1c 15.2. On Levemir 5 units twice a day. CBG (last 3)  Recent Labs    12/13/19 2340 12/14/19 0330 12/14/19 0751  GLUCAP 200* 104* 96    Was on for farxiga  10 mg daily with Trulicity and Glucotrol 5 mg twice a day prior to admission.  Restart on discharge. On Neurontin for neuropathy.  #3 dysphagia -followed by speech therapy.  MBS done 12/13/2019.  Recommending regular texture thin liquids.   #4 status post acute hypoxic respiratory failure likely secondary to aspiration patient was found with coffee-ground emesis and confused at home prior to EMS picking her up.  Patient was intubated.  No further emesis since admission.  Change Protonix to p.o. daily as she was taking prior to admission.  #5 history of bipolar disorder with hallucinations.  Continue home medications.  #6 AKI resolved due to severe dehydration and DKA.  #7 question multinodular goiter needs outpatient follow-up  #8 mildly elevated hCG patient has had hysterectomy.  #9 mild hypokalemia potassium 3.5 magnesium 1.9.  #10 depression restart home medications.  Estimated body mass index is 21.91 kg/m as calculated from the following:   Height as of this encounter: _0  (1.626 m).   Weight as of this encounter: 57.9 kg.  DVT prophylaxis: SCD Code Status: Full code Family Communication: None at bedside  disposition Plan:  Status is: Inpatient  Dispo: The patient is from: Home              Anticipated d/c is to: Unknown  Anticipated d/c date is: Unknown              Patient currently is medically stable to discharge.  Patient admitted with DKA and Candida sepsis on Diflucan.  Was n.p.o. started on a diet.  Await PT evaluation.  Case manager consulted.  Discussed with the patient's brother Nicole Kindred since I was unable to reach her daughter.  Patient will need significant help upon discharge and taking her  medications.  She is still very drowsy.  Family wants another day in order to prepare for discharge.   Consultants:   PCCM and infectious disease and ophthalmology  Procedures:  Antimicrobials: Diflucan  Subjective: Patient resting in bed eating breakfast.  Did not sleep well last night even with melatonin.  Has short-term memory loss which is chronic.  Objective: Vitals:   12/14/19 0500 12/14/19 0600 12/14/19 0750 12/14/19 0800  BP: (!) 146/80 (!) 148/77  120/63  Pulse: 81 81  88  Resp: _0 Temp:   (!) 97.2 F (36.2 C)   TempSrc:   Oral   SpO2: 99% 93%  (!) 89%  Weight:      Height:        Intake/Output Summary (Last 24 hours) at 12/14/2019 0934 Last data filed at 12/14/2019 0800 Gross per 24 hour  Intake 367.16 ml  Output --  Net 367.16 ml   Filed Weights   12/12/19 0500 12/13/19 0410 12/14/19 0424  Weight: 55.2 kg 57.6 kg 57.9 kg    Examination:  General exam: Appears calm and comfortable  Respiratory system: Clear to auscultation. Respiratory effort normal. Cardiovascular system: S1 & S2 heard, RRR. No JVD, murmurs, rubs, gallops or clicks. No pedal edema. Gastrointestinal system: Abdomen is nondistended, soft and nontender. No organomegaly or masses felt. Normal bowel sounds heard. Central nervous system: Alert and oriented. No focal neurological deficits. Extremities trace pitting edema skin: No rashes, lesions or ulcers Psychiatry: Impaired judgment  Data Reviewed: I have personally reviewed following labs and imaging studies  CBC: Recent Labs  Lab 12/09/19 2120 12/09/19 2127 12/10/19 0023 12/10/19 0210 12/10/19 0251 12/10/19 0406 12/10/19 1518 12/10/19 2014 12/12/19 0427 12/12/19 1511 12/12/19 2000 12/13/19 0412 12/14/19 0649  WBC 34.3*  --  31.0*  --  29.8*  --  11.2*  --   --   --   --   --  12.4*  NEUTROABS 29.4*  --  27.8*  --   --   --   --   --   --   --   --   --   --   HGB 16.7*   < > 16.0*   < > 16.4*   < > 12.3   < > 10.6*  10.8* 11.0* 11.1* 12.4  HCT 52.8*   < > 47.3*   < > 48.0*   < > 36.0   < > 32.2* 32.9* 33.7* 34.6* 38.0  MCV 100.4*  --  95.4  --  91.6  --  91.4  --   --   --   --   --  98.4  PLT 323  --  256  --  271  --  131*  --   --   --   --   --  168   < > = values in this interval not displayed.   Basic Metabolic Panel: Recent Labs  Lab 12/10/19 0251 12/10/19 0406 12/10/19 1307 12/10/19 2014 12/11/19 0420 12/11/19 0420 12/11/19 6389 12/11/19 1959 12/12/19  5465 12/12/19 0427 12/12/19 1150 12/13/19 0130 12/13/19 0412 12/13/19 1513 12/14/19 0649  NA 142   < >  --  142  --   --  142  --   --   --  136  --  140  --  141  K 4.1   < >  --  3.1*  --   --  3.6  --   --   --  4.3  --  3.4*  --  3.5  CL 103   < >  --  110  --   --  112*  --   --   --  106  --  109  --  105  CO2 17*   < >  --  27  --   --  25  --   --   --  24  --  25  --  29  GLUCOSE 572*   < >  --  190*  --   --  170*  --   --   --  328*  --  132*  --  83  BUN 45*   < >  --  28*  --   --  20  --   --   --  13  --  12  --  8  CREATININE 1.96*   < >  --  0.68  --   --  0.57  --   --   --  0.54  --  0.44  --  0.40*  CALCIUM 10.1   < >  --  8.7*  --   --  8.7*  --   --   --  8.6*  --  8.5*  --  8.6*  MG 1.9   < >   < >  --  1.4*   < >  --   --  2.2  --  2.1 1.7  --  2.1 1.9  PHOS 1.5*  --   --   --  1.3*  --   --  4.0  --  2.5  --   --   --   --   --    < > = values in this interval not displayed.   GFR: Estimated Creatinine Clearance: 70.2 mL/min (A) (by C-G formula based on SCr of 0.4 mg/dL (L)). Liver Function Tests: Recent Labs  Lab 12/09/19 2120 12/14/19 0649  AST 17 12*  ALT 17 16  ALKPHOS 74 75  BILITOT 2.7* 0.3  PROT 6.0* 4.6*  ALBUMIN 3.4* 1.8*   Recent Labs  Lab 12/10/19 0251  LIPASE 903*  AMYLASE 774*   Recent Labs  Lab 12/09/19 2120  AMMONIA 28   Coagulation Profile: Recent Labs  Lab 12/09/19 2120  INR 1.1   Cardiac Enzymes: No results for input(s): CKTOTAL, CKMB, CKMBINDEX, TROPONINI in the  last 168 hours. BNP (last 3 results) No results for input(s): PROBNP in the last 8760 hours. HbA1C: Recent Labs    12/12/19 0956  HGBA1C 15.2*   CBG: Recent Labs  Lab 12/13/19 1529 12/13/19 1925 12/13/19 2340 12/14/19 0330 12/14/19 0751  GLUCAP 131* 289* 200* 104* 96   Lipid Profile: Recent Labs    12/13/19 1513  TRIG 148   Thyroid Function Tests: No results for input(s): TSH, T4TOTAL, FREET4, T3FREE, THYROIDAB in the last 72 hours. Anemia Panel: No results for input(s): VITAMINB12, FOLATE, FERRITIN, TIBC, IRON, RETICCTPCT in the last 72 hours. Sepsis Labs: Recent Labs  Lab 12/09/19 2120 12/10/19 0023 12/10/19 0251 12/11/19 0628  PROCALCITON  --   --  0.52  --   LATICACIDVEN 3.0* 1.9 2.0* 1.4    Recent Results (from the past 240 hour(s))  Urine culture     Status: None   Collection Time: 12/09/19  9:10 PM   Specimen: Urine, Random  Result Value Ref Range Status   Specimen Description URINE, RANDOM  Final   Special Requests NONE  Final   Culture   Final    NO GROWTH Performed at [facility], [place] [address], [place], [place] [address]    Report Status 12/11/2019 FINAL  Final  Blood Culture (routine x 2)     Status: None (Preliminary result)   Collection Time: 12/09/19  9:15 PM   Specimen: BLOOD  Result Value Ref Range Status   Specimen Description BLOOD RIGHT ANTECUBITAL  Final   Special Requests   Final    BOTTLES DRAWN AEROBIC AND ANAEROBIC Blood Culture adequate volume   Culture   Final    NO GROWTH 4 DAYS Performed at [facility], [place] [address] [place], [place] [address]    Report Status PENDING  Incomplete  Blood Culture (routine x 2)     Status: Abnormal (Preliminary result)   Collection Time: 12/09/19  9:18 PM   Specimen: BLOOD RIGHT HAND  Result Value Ref Range Status   Specimen Description BLOOD RIGHT HAND  Final   Special Requests   Final    BOTTLES DRAWN AEROBIC ONLY Blood Culture results may not be optimal due to an  inadequate volume of blood received in culture bottles   Culture  Setup Time   Final    AEROBIC BOTTLE ONLY YEAST CRITICAL RESULT CALLED TO, READ BACK BY AND VERIFIED WITH: V BRYK PHARMD 12/11/19 2345 JDW    Culture (A)  Final    CANDIDA ALBICANS Sent to [place] for further susceptibility testing. Performed at [facility], [place] [address] [place], [place] [address]    Report Status PENDING  Incomplete  Blood Culture ID Panel (Reflexed)     Status: Abnormal   Collection Time: 12/09/19  9:18 PM  Result Value Ref Range Status   Enterococcus species NOT DETECTED NOT DETECTED Final   Listeria monocytogenes NOT DETECTED NOT DETECTED Final   Staphylococcus species NOT DETECTED NOT DETECTED Final   Staphylococcus aureus (BCID) NOT DETECTED NOT DETECTED Final   Streptococcus species NOT DETECTED NOT DETECTED Final   Streptococcus agalactiae NOT DETECTED NOT DETECTED Final   Streptococcus pneumoniae NOT DETECTED NOT DETECTED Final   Streptococcus pyogenes NOT DETECTED NOT DETECTED Final   Acinetobacter baumannii NOT DETECTED NOT DETECTED Final   Enterobacteriaceae species NOT DETECTED NOT DETECTED Final   Enterobacter cloacae complex NOT DETECTED NOT DETECTED Final   Escherichia coli NOT DETECTED NOT DETECTED Final   Klebsiella oxytoca NOT DETECTED NOT DETECTED Final   Klebsiella pneumoniae NOT DETECTED NOT DETECTED Final   Proteus species NOT DETECTED NOT DETECTED Final   Serratia marcescens NOT DETECTED NOT DETECTED Final   Haemophilus influenzae NOT DETECTED NOT DETECTED Final   Neisseria meningitidis NOT DETECTED NOT DETECTED Final   Pseudomonas aeruginosa NOT DETECTED NOT DETECTED Final   Candida albicans DETECTED (A) NOT DETECTED Final    Comment: CRITICAL RESULT CALLED TO, READ BACK BY AND VERIFIED WITH: V BRYK PHARMD 12/11/19 2345 JDW    Candida glabrata NOT DETECTED NOT DETECTED Final   Candida krusei NOT DETECTED NOT  DETECTED Final   Candida parapsilosis NOT DETECTED NOT  DETECTED Final   Candida tropicalis NOT DETECTED NOT DETECTED Final    Comment: Performed at [facility], [place] [address], [place], [place] [address]  SARS Coronavirus 2 by RT PCR (hospital order, performed in [facility] hospital lab) Nasopharyngeal Nasopharyngeal Swab     Status: None   Collection Time: 12/10/19 12:16 AM   Specimen: Nasopharyngeal Swab  Result Value Ref Range Status   SARS Coronavirus 2 NEGATIVE NEGATIVE Final    Comment: (NOTE) SARS-CoV-2 target nucleic acids are NOT DETECTED.  The SARS-CoV-2 RNA is generally detectable in upper and lower respiratory specimens during the acute phase of infection. The lowest concentration of SARS-CoV-2 viral copies this assay can detect is 250 copies / mL. A negative result does not preclude SARS-CoV-2 infection and should not be used as the sole basis for treatment or other patient management decisions.  A negative result may occur with improper specimen collection / handling, submission of specimen other than nasopharyngeal swab, presence of viral mutation(s) within the areas targeted by this assay, and inadequate number of viral copies (<250 copies / mL). A negative result must be combined with clinical observations, patient history, and epidemiological information.  Fact Sheet for Patients:   StrictlyIdeas.no  Fact Sheet for Healthcare Providers: BankingDealers.co.za  This test is not yet approved or  cleared by the [place] FDA and has been authorized for detection and/or diagnosis of SARS-CoV-2 by FDA under an Emergency Use Authorization (EUA).  This EUA will remain in effect (meaning this test can be used) for the duration of the COVID-19 declaration under Section 564(b)(1) of the Act, 21 U.S.C. section 360bbb-3(b)(1), unless the authorization is terminated or revoked sooner.  Performed at [facility], [place] [address] [place], [place] [address]   MRSA PCR  Screening     Status: Abnormal   Collection Time: 12/10/19  2:39 AM   Specimen: Nasal Mucosa; Nasopharyngeal  Result Value Ref Range Status   MRSA by PCR POSITIVE (A) NEGATIVE Final    Comment:        The GeneXpert MRSA Assay (FDA approved for NASAL specimens only), is one component of a comprehensive MRSA colonization surveillance program. It is not intended to diagnose MRSA infection nor to guide or monitor treatment for MRSA infections. RESULT CALLED TO, READ BACK BY AND VERIFIED WITH: [facility] RN [id number] 12/10/2019 MITCHELL,L Performed at [facility], [place] [address], [place], [place] [address]   Culture, blood (routine x 2)     Status: None (Preliminary result)   Collection Time: 12/12/19 11:13 AM   Specimen: BLOOD  Result Value Ref Range Status   Specimen Description BLOOD RIGHT ANTECUBITAL  Final   Special Requests   Final    BOTTLES DRAWN AEROBIC AND ANAEROBIC Blood Culture adequate volume   Culture   Final    NO GROWTH < 24 HOURS Performed at [facility], [place] [address], [place],  [address]    Report Status PENDING  Incomplete  Culture, blood (routine x 2)     Status: None (Preliminary result)   Collection Time: 12/12/19 11:21 AM   Specimen: BLOOD RIGHT HAND  Result Value Ref Range Status   Specimen Description BLOOD RIGHT HAND  Final   Special Requests   Final    BOTTLES DRAWN AEROBIC ONLY Blood Culture adequate volume   Culture   Final    NO GROWTH < 24 HOURS Performed at  [facility], [place] [address], [place], [place] [address]    Report Status PENDING  Incomplete         Radiology Studies: DG Swallowing Func-Speech Pathology  Result Date: 12/13/2019 Objective Swallowing Evaluation: Type of Study: MBS-Modified Barium Swallow Study  Patient Details Name: VALBONA SLABACH MRN: [id number] Date of Birth: 1966/02/13 Today's Date: 12/13/2019 Time: SLP Start Time (ACUTE ONLY): 1330 -SLP Stop Time (ACUTE ONLY): 1344 SLP Time Calculation (min)  (ACUTE ONLY): 14 min Past Medical History: Past Medical History: Diagnosis Date . Bipolar 1 disorder ([place])  . Diabetes mellitus  . Fibromyalgia  . Hypertension  Past Surgical History: Past Surgical History: Procedure Laterality Date . ABDOMINAL HYSTERECTOMY  01/2019 . CESAREAN SECTION   . HERNIA REPAIR   HPI: 54 y.o. with PMH of DM, Bipolar Disorder, fibromyalgia and HTN who was found by family altered with coffee ground emesis. Found to be in DKA with continued confusion. Intubated 18 hours. CXR lungs are clear.  No data recorded Assessment / Plan / Recommendation CHL IP CLINICAL IMPRESSIONS 12/13/2019 Clinical Impression Pt exhibits an overall normal swallow although unable to orally transit barium pill with water. She expectorated pill and water into cup and did not attempt with puree although suspect she would not have had difficulty. Timing, coordination and motor strength were within normal limits to prevent aspiration with normal/appropriate, flash penetration with thin. Unremarkable esophageal scan. Pt coughed during study as she often does at bedside with or without po's. Recommend regular texture, thin liquids, straws allowed, pills whole in puree. ST will follow up briefly.    SLP Visit Diagnosis Dysphagia, unspecified (R13.10) Attention and concentration deficit following -- Frontal lobe and executive function deficit following -- Impact on safety and function Mild aspiration risk   CHL IP TREATMENT RECOMMENDATION 12/13/2019 Treatment Recommendations Therapy as outlined in treatment plan below   Prognosis 12/13/2019 Prognosis for Safe Diet Advancement Good Barriers to Reach Goals (No Data) Barriers/Prognosis Comment -- CHL IP DIET RECOMMENDATION 12/13/2019 SLP Diet Recommendations Regular solids;Thin liquid Liquid Administration via Cup;Straw Medication Administration Whole meds with puree Compensations Slow rate;Small sips/bites Postural Changes Seated upright at 90 degrees   CHL IP OTHER RECOMMENDATIONS  12/13/2019 Recommended Consults -- Oral Care Recommendations Oral care BID Other Recommendations --   CHL IP FOLLOW UP RECOMMENDATIONS 12/13/2019 Follow up Recommendations None   CHL IP FREQUENCY AND DURATION 12/13/2019 Speech Therapy Frequency (ACUTE ONLY) min 2x/week Treatment Duration 2 weeks      CHL IP ORAL PHASE 12/13/2019 Oral Phase Impaired Oral - Pudding Teaspoon -- Oral - Pudding Cup -- Oral - Honey Teaspoon -- Oral - Honey Cup -- Oral - Nectar Teaspoon -- Oral - Nectar Cup -- Oral - Nectar Straw -- Oral - Thin Teaspoon -- Oral - Thin Cup WFL Oral - Thin Straw WFL Oral - Puree -- Oral - Mech Soft -- Oral - Regular WFL Oral - Multi-Consistency -- Oral - Pill Other (Comment);Decreased bolus cohesion Oral Phase - Comment --  CHL IP PHARYNGEAL PHASE 12/13/2019 Pharyngeal Phase WFL Pharyngeal- Pudding Teaspoon -- Pharyngeal -- Pharyngeal- Pudding Cup -- Pharyngeal -- Pharyngeal- Honey Teaspoon -- Pharyngeal -- Pharyngeal- Honey Cup -- Pharyngeal -- Pharyngeal- Nectar Teaspoon -- Pharyngeal -- Pharyngeal- Nectar Cup -- Pharyngeal -- Pharyngeal- Nectar Straw -- Pharyngeal -- Pharyngeal- Thin Teaspoon -- Pharyngeal -- Pharyngeal- Thin Cup -- Pharyngeal -- Pharyngeal- Thin Straw -- Pharyngeal -- Pharyngeal- Puree -- Pharyngeal -- Pharyngeal- Mechanical Soft -- Pharyngeal -- Pharyngeal- Regular -- Pharyngeal -- Pharyngeal-  Multi-consistency -- Pharyngeal -- Pharyngeal- Pill -- Pharyngeal -- Pharyngeal Comment --  CHL IP CERVICAL ESOPHAGEAL PHASE 12/13/2019 Cervical Esophageal Phase WFL Pudding Teaspoon -- Pudding Cup -- Honey Teaspoon -- Honey Cup -- Nectar Teaspoon -- Nectar Cup -- Nectar Straw -- Thin Teaspoon -- Thin Cup -- Thin Straw -- Puree -- Mechanical Soft -- Regular -- Multi-consistency -- Pill -- Cervical Esophageal Comment -- [place] Siren 12/13/2019, 2:13 PM Orbie Pyo Colvin Caroli.Ed Actor Pager [phone] Office [phone]              ECHOCARDIOGRAM COMPLETE  Result Date:  12/12/2019    ECHOCARDIOGRAM REPORT   Patient Name:   NEAH SPORRER Date of Exam: 12/12/2019 Medical Rec #:  [id number]       Height:       64.0 in Accession #:    [id number]      Weight:       121.7 lb Date of Birth:  1965-11-30       BSA:          1.584 m Patient Age:    24 years        BP:           102/57 mmHg Patient Gender: F               HR:           70 bpm. Exam Location:  Inpatient Procedure: 2D Echo Indications:    Fungemia  History:        Patient has no prior history of Echocardiogram examinations.                 Sepsis, Signs/Symptoms:Altered Mental Status; Risk                 Factors:Diabetes.  Sonographer:    Johny Chess Referring Phys: [id number] [place]  1. Left ventricular ejection fraction, by estimation, is 50 to 55%. The left ventricle has low normal function. The left ventricle has no regional wall motion abnormalities. Left ventricular diastolic parameters are indeterminate.  2. Right ventricular systolic function is mildly reduced. The right ventricular size is mildly enlarged. There is normal pulmonary artery systolic pressure.  3. Left atrial size was mildly dilated.  4. The mitral valve is normal in structure. Trivial mitral valve regurgitation. No evidence of mitral stenosis.  5. The aortic valve is tricuspid. Aortic valve regurgitation is not visualized. No aortic stenosis is present.  6. The inferior vena cava is dilated in size with <50% respiratory variability, suggesting right atrial pressure of 15 mmHg. Comparison(s): No prior Echocardiogram. Conclusion(s)/Recommendation(s): No evidence of valvular vegetations on this transthoracic echocardiogram. Would recommend a transesophageal echocardiogram to exclude infective endocarditis if clinically indicated. FINDINGS  Left Ventricle: Left ventricular ejection fraction, by estimation, is 50 to 55%. The left ventricle has low normal function. The left ventricle has no regional wall motion abnormalities. The left  ventricular internal cavity size was normal in size. There is no left ventricular hypertrophy. Left ventricular diastolic parameters are indeterminate. Right Ventricle: The right ventricular size is mildly enlarged. No increase in right ventricular wall thickness. Right ventricular systolic function is mildly reduced. There is normal pulmonary artery systolic pressure. The tricuspid regurgitant velocity  is 2.09 m/s, and with an assumed right atrial pressure of 15 mmHg, the estimated right ventricular systolic pressure is 78.6 mmHg. Left Atrium: Left atrial size was mildly dilated. Right Atrium: Right atrial size was normal in size. Pericardium:  There is no evidence of pericardial effusion. Mitral Valve: The mitral valve is normal in structure. Trivial mitral valve regurgitation. No evidence of mitral valve stenosis. Tricuspid Valve: The tricuspid valve is normal in structure. Tricuspid valve regurgitation is mild . No evidence of tricuspid stenosis. Aortic Valve: The aortic valve is tricuspid. Aortic valve regurgitation is not visualized. No aortic stenosis is present. Pulmonic Valve: The pulmonic valve was not well visualized. Pulmonic valve regurgitation is not visualized. No evidence of pulmonic stenosis. Aorta: The aortic root, ascending aorta, aortic arch and descending aorta are all structurally normal, with no evidence of dilitation or obstruction. Venous: The inferior vena cava is dilated in size with less than 50% respiratory variability, suggesting right atrial pressure of 15 mmHg. IAS/Shunts: No atrial level shunt detected by color flow Doppler.  LEFT VENTRICLE PLAX 2D LVIDd:         4.40 cm  Diastology LVIDs:         3.10 cm  LV e' lateral:   7.29 cm/s LV PW:         0.80 cm  LV E/e' lateral: 10.2 LV IVS:        0.80 cm  LV e' medial:    7.40 cm/s LVOT diam:     1.80 cm  LV E/e' medial:  10.1 LV SV:         46 LV SV Index:   29 LVOT Area:     2.54 cm  RIGHT VENTRICLE            IVC RV S prime:     9.25  cm/s  IVC diam: 2.10 cm TAPSE (M-mode): 1.5 cm LEFT ATRIUM             Index       RIGHT ATRIUM          Index LA diam:        3.20 cm 2.02 cm/m  RA Area:     9.58 cm LA Vol (A2C):   63.1 ml 39.84 ml/m RA Volume:   19.30 ml 12.18 ml/m LA Vol (A4C):   36.4 ml 22.98 ml/m LA Biplane Vol: 48.8 ml 30.81 ml/m  AORTIC VALVE LVOT Vmax:   89.70 cm/s LVOT Vmean:  62.000 cm/s LVOT VTI:    0.179 m  AORTA Ao Root diam: 3.00 cm Ao Asc diam:  3.00 cm MITRAL VALVE               TRICUSPID VALVE MV Area (PHT): 2.95 cm    TR Peak grad:   17.5 mmHg MV Decel Time: 257 msec    TR Vmax:        209.00 cm/s MV E velocity: 74.40 cm/s MV A velocity: 72.40 cm/s  SHUNTS MV E/A ratio:  1.03        Systemic VTI:  0.18 m                            Systemic Diam: 1.80 cm Buford Dresser MD Electronically signed by Buford Dresser MD Signature Date/Time: 12/12/2019/10:59:16 AM    Final         Scheduled Meds: . atorvastatin  20 mg Oral Daily  . chlorhexidine gluconate (MEDLINE KIT)  15 mL Mouth Rinse BID  . Chlorhexidine Gluconate Cloth  6 each Topical Daily  . [START ON 12/15/2019] dapagliflozin propanediol  10 mg Oral QAC breakfast  . docusate sodium  100 mg Oral BID  . DULoxetine  60 mg Oral Daily  . fluconazole  800 mg Oral Daily  . gabapentin  300 mg Oral BID  . insulin aspart  0-9 Units Subcutaneous Q4H  . insulin detemir  5 Units Subcutaneous BID  . melatonin  3 mg Oral QHS  . mometasone-formoterol  2 puff Inhalation BID  . mupirocin ointment  1 application Nasal BID  . [START ON 12/15/2019] pantoprazole  40 mg Oral Daily  . polyethylene glycol  17 g Oral Daily  . sertraline  150 mg Oral Daily  . sodium chloride flush  10-40 mL Intracatheter Q12H  . [START ON 12/15/2019] venlafaxine XR  75 mg Oral Q breakfast   Continuous Infusions: . sodium chloride       LOS: 4 days     Georgette Shell, MD 12/14/2019, 9:34 AM

## 2019-12-14 NOTE — Evaluation (Signed)
Physical Therapy Evaluation Patient Details Name: Courtney Bennett MRN: [id number] DOB: 02-11-1966 Today's Date: 12/14/2019   History of Present Illness  54 yo found by family on floor with dark emesis with AMS eating cigarette butts. Pt in DKA with Aflutter with RVR. Intubated 7/11-7/12. PMHx: bipolar, DM, HTN, fibromyalgia  Clinical Impression  Pt tired on arrival reporting lack of sleep. Pt lives with 36yo son and reports she normally cares for herself but doesn't really have a system for medication management and knows she has to make some changes. Pt reports her sister can come stay with her from [place] for a few weeks and help her get set up at home. Pt states no falls in the last year with intermittent cane use. Pt educated for need for support with gait currently and need for improved system for medication and self care. Pt with bil hip weakness and educated for HEP but fatigues quickly with performing. Pt with decreased strength, transfers, gait and mobility who will benefit from acute therapy to maximize mobility, safety and independence.  HR 88 SpO2 93% on RA BP 133/71 sitting EOB    Follow Up Recommendations Home health PT;Supervision/Assistance - 24 hour (initial 24hr assist and if no support [facility])    Equipment Recommendations  Rolling walker with 5" wheels    Recommendations for Other Services       Precautions / Restrictions Precautions Precautions: Fall      Mobility  Bed Mobility Overal bed mobility: Needs Assistance Bed Mobility: Supine to Sit     Supine to sit: Min guard     General bed mobility comments: guarding for lines and safety with increased time, use of rail and HOB 25 degrees  Transfers Overall transfer level: Needs assistance Equipment used: 2 person hand held assist Transfers: Sit to/from Stand Sit to Stand: Min assist;+2 safety/equipment         General transfer comment: unsteady at times  Ambulation/Gait Ambulation/Gait assistance: Min  assist Gait Distance (Feet): 150 Feet Assistive device: 2 person hand held assist Gait Pattern/deviations: Step-through pattern;Decreased stride length   Gait velocity interpretation: 1.31 - 2.62 ft/sec, indicative of limited community ambulator General Gait Details: pt with slow cautious gait with bil UE support for balance, cues for direction to room and assist for lines  Stairs            Wheelchair Mobility    Modified Rankin (Stroke Patients Only)       Balance Overall balance assessment: Needs assistance Sitting-balance support: No upper extremity supported;Feet supported Sitting balance-Leahy Scale: Fair     Standing balance support: Single extremity supported;Bilateral upper extremity supported Standing balance-Leahy Scale: Poor Standing balance comment: able to stand without support briefly but benefits from single and bil UE support for gait                             Pertinent Vitals/Pain Pain Assessment: Faces Pain Score: 4  Faces Pain Scale: Hurts little more Pain Location: abdomen Pain Descriptors / Indicators: Discomfort;Aching Pain Intervention(s): Limited activity within patient's tolerance;Repositioned    Home Living Family/patient expects to be discharged to:: Private residence Living Arrangements: Children Available Help at Discharge: Family Type of Home: Mobile home Home Access: Stairs to enter   Entrance Stairs-Number of Steps: 3 Home Layout: One level Home Equipment: Cane - single point;Shower seat      Prior Function Level of Independence: Independent with assistive device(s)  Comments: uses cane for community ambulation     Hand Dominance   Dominant Hand: Right    Extremity/Trunk Assessment   Upper Extremity Assessment Upper Extremity Assessment: Defer to OT evaluation    Lower Extremity Assessment Lower Extremity Assessment: Generalized weakness;RLE deficits/detail;LLE deficits/detail RLE Deficits  / Details: hip flexion 3/5, knee extension 4/5 LLE Deficits / Details: hip flexion 3/5, knee extension 4/5    Cervical / Trunk Assessment Cervical / Trunk Assessment: Other exceptions Cervical / Trunk Exceptions: forward head, rounded shoulders  Communication   Communication: No difficulties  Cognition Arousal/Alertness: Awake/alert Behavior During Therapy: Flat affect Overall Cognitive Status: Impaired/Different from baseline Area of Impairment: Attention;Memory;Safety/judgement;Awareness;Problem solving                   Current Attention Level: Selective Memory: Decreased short-term memory   Safety/Judgement: Decreased awareness of deficits Awareness: Emergent Problem Solving: Slow processing General Comments: Reports some difficulty remembering; poor compliance with meds      General Comments General comments (skin integrity, edema, etc.): VSS throughout session on RA    Exercises General Exercises - Lower Extremity Hip Flexion/Marching: AROM;Both;Seated;5 reps (pt fatigued after 5 reps and unable to continue)   Assessment/Plan    PT Assessment Patient needs continued PT services  PT Problem List Decreased strength;Decreased mobility;Decreased activity tolerance;Decreased balance;Decreased knowledge of use of DME;Pain       PT Treatment Interventions DME instruction;Therapeutic exercise;Gait training;Balance training;Stair training;Functional mobility training;Cognitive remediation;Therapeutic activities;Patient/family education    PT Goals (Current goals can be found in the Care Plan section)  Acute Rehab PT Goals Patient Stated Goal: to take better care of herself PT Goal Formulation: With patient Time For Goal Achievement: 12/28/19 Potential to Achieve Goals: Good    Frequency Min 3X/week   Barriers to discharge Decreased caregiver support      Co-evaluation   Reason for Co-Treatment: To address functional/ADL transfers;For patient/therapist  safety   OT goals addressed during session: ADL's and self-care       AM-PAC PT "6 Clicks" Mobility  Outcome Measure Help needed turning from your back to your side while in a flat bed without using bedrails?: A Little Help needed moving from lying on your back to sitting on the side of a flat bed without using bedrails?: A Little Help needed moving to and from a bed to a chair (including a wheelchair)?: A Little Help needed standing up from a chair using your arms (e.g., wheelchair or bedside chair)?: A Little Help needed to walk in hospital room?: A Little Help needed climbing 3-5 steps with a railing? : A Little 6 Click Score: 18    End of Session Equipment Utilized During Treatment: Gait belt Activity Tolerance: Patient tolerated treatment well Patient left: in chair;with call bell/phone within reach;with chair alarm set Nurse Communication: Mobility status PT Visit Diagnosis: Other abnormalities of gait and mobility (R26.89);Muscle weakness (generalized) (M62.81);Difficulty in walking, not elsewhere classified (R26.2)    Time: 0814-4818 PT Time Calculation (min) (ACUTE ONLY): 27 min   Charges:   PT Evaluation $PT Eval Moderate Complexity: 1 Mod          [place], PT Acute Rehabilitation Services Pager: [phone] Office: [phone]   Sandy Salaam Elester Apodaca 12/14/2019, 12:26 PM

## 2019-12-14 NOTE — Discharge Instructions (Signed)
Insulin Injection Instructions, Single Insulin Dose, Adult A subcutaneous injection is a shot of medicine that is injected into the layer of fat and tissue between skin and muscle. People with type 1 diabetes must take insulin because their bodies do not make it. People with type 2 diabetes may need to take insulin.  There are many different types of insulin. The type of insulin that you take may determine how many injections you give yourself and when you need to give the injections. Supplies needed: Soap and water to wash hands. A new, unused insulin syringe. Your insulin medication bottle (vial). Alcohol wipes. A disposal container that is meant for sharp items (sharps container), such as an empty plastic bottle with a cover. How to choose a site for injection The body absorbs insulin differently, depending on where the insulin is injected (injection site). It is best to inject insulin into the same body area each time (for example, always in the abdomen), but you should use a different spot in that area for each injection. Do not inject the insulin in the same spot each time. There are five main areas that can be used for injecting. These areas include: Abdomen. This is the preferred area. Front of thigh. Upper, outer side of thigh. Upper, outer side of arm. Upper, outer part of buttock. How to give a single-dose insulin injection First, follow the steps for Get ready, then continue with the steps for Push air into the vial, then follow the steps for Fill the syringe, and finish with the steps for Inject the insulin. Get ready Wash your hands with soap and water. If soap and water are not available, use hand sanitizer. Before you give yourself an insulin injection, be sure to test your blood sugar level (blood glucose level) and write down that number. Follow any instructions from your health care provider about what to do if your blood glucose level is higher or lower than your normal  range. Use a new, unused insulin syringe each time you need to inject insulin. Check to make sure you have the correct type of insulin syringe for the concentration of insulin that you are using. Check the expiration date and the type of insulin that you are using. If you are using CLEAR insulin, check to see that it is clear and free of clumps. Do not shake the vial to get it ready. Gently roll the vial between your palms several times. Remove the plastic pop-top covering from the vial of insulin. This type of covering is present on a vial when it is new. Use an alcohol wipe to clean the rubber top of the vial. Remove the plastic cover from the syringe needle. Do not let the needle touch anything. Push air into the vial To bring (draw up) air into the syringe, slowly pull back on the syringe plunger. Stop pulling the plunger when the dose indicator gets to the number of units that you will be using. While you keep the vial right-side-up, poke the needle through the rubber top of the vial. Do not turn the vial upside down to do this. Push the plunger all the way into the syringe. Doing that will push air into the vial. Do not take the needle out of the vial yet. Fill the syringe  While the needle is still in the vial, turn the vial upside down and hold it at eye level. Slowly pull back on the plunger. Stop pulling the plunger when the dose indicator gets to  the desired number of units. If you see air bubbles in the syringe, slowly move the plunger up and down 2 or 3 times to make them go away. If you had to move the plunger to get rid of air bubbles, pull back the plunger until the dose indicator returns to the correct dose. Remove the needle from the vial. Do not let the needle touch anything. Inject the insulin  Use an alcohol wipe to clean the site where you will be injecting the needle. Let the site air-dry. Hold the syringe in your writing hand like a pencil. Use your other hand to  pinch and hold about an inch (2.5 cm) of skin. Do not directly touch the cleaned part of the skin. Gently but quickly, put the needle straight into the skin. The needle should be at a 90-degree angle (perpendicular) to the skin. Push the needle in as far as it will go (to the hub). When the needle is completely inserted into the skin, use your thumb or index finger of your writing hand to push the plunger all the way into the syringe to inject the insulin. Let go of the skin that you are pinching. Continue to hold the syringe in place with your writing hand. Wait 10 seconds, then pull the needle straight out of the skin. This will allow all of the insulin to go from the syringe and needle into your body. Press and hold the alcohol wipe over the injection site until any bleeding stops. Do not rub the area. Do not put the plastic cover back on the needle. Discard the syringe and needle directly into a sharps container, such as an empty plastic bottle with a cover. How to throw away supplies Discard all used needles in a puncture-proof sharps disposal container. You can ask your local pharmacy about where you can get this kind of disposal container, or you can use an empty plastic liquid laundry detergent bottle that has a cover. Follow the disposal regulations for the area where you live. Do not use any syringe or needle more than one time. Throw away empty vials in the regular trash. Questions to ask your health care provider How often should I be taking insulin? How often should I check my blood glucose? What amount of insulin should I be taking at each time? What are the side effects? What should I do if my blood glucose is too high? What should I do if my blood glucose is too low? What should I do if I forget to take my insulin? What number should I call if I have questions? Where to find more information American Diabetes Association (ADA): www.diabetes.org American Association of Diabetes  Educators (AADE) Patient Resources: https://www.diabeteseducator.org Summary A subcutaneous injection is a shot of medicine that is injected into the layer of fat and tissue between skin and muscle. Before you give yourself an insulin injection, be sure to test your blood sugar level (blood glucose level) and write down that number. The type of insulin that you take may determine how many injections you give yourself and when you need to give the injections. Check the expiration date and the type of insulin that you are using. It is best to inject insulin into the same body area each time (for example, always in the abdomen), but you should use a different spot in that area for each injection. This information is not intended to replace advice given to you by your health care provider. Make sure you  discuss any questions you have with your health care provider. Document Revised: 05/21/2017 Document Reviewed: 06/21/2015 Elsevier Patient Education  2020 [place]. Blood Glucose Monitoring, Adult Monitoring your blood sugar (glucose) is an important part of managing your diabetes (diabetes mellitus). Blood glucose monitoring involves checking your blood glucose as often as directed and keeping a record (log) of your results over time. Checking your blood glucose regularly and keeping a blood glucose log can:  Help you and your health care provider adjust your diabetes management plan as needed, including your medicines or insulin.  Help you understand how food, exercise, illnesses, and medicines affect your blood glucose.  Let you know what your blood glucose is at any time. You can quickly find out if you have low blood glucose (hypoglycemia) or high blood glucose (hyperglycemia). Your health care provider will set individualized treatment goals for you. Your goals will be based on your age, other medical conditions you have, and how you respond to diabetes treatment. Generally, the goal of  treatment is to maintain the following blood glucose levels:  Before meals (preprandial): 80-130 mg/dL (4.4-7.2 mmol/L).  After meals (postprandial): below 180 mg/dL (10 mmol/L).  A1c level: less than 7%. Supplies needed:  Blood glucose meter.  Test strips for your meter. Each meter has its own strips. You must use the strips that came with your meter.  A needle to prick your finger (lancet). Do not use a lancet more than one time.  A device that holds the lancet (lancing device).  A journal or log book to write down your results. How to check your blood glucose  1. Wash your hands with soap and water. 2. Prick the side of your finger (not the tip) with the lancet. Use a different finger each time. 3. Gently rub the finger until a small drop of blood appears. 4. Follow instructions that come with your meter for inserting the test strip, applying blood to the strip, and using your blood glucose meter. 5. Write down your result and any notes. Some meters allow you to use areas of your body other than your finger (alternative sites) to test your blood. The most common alternative sites are:  Forearm.  Thigh.  Palm of the hand. If you think you may have hypoglycemia, or if you have a history of not knowing when your blood glucose is getting low (hypoglycemia unawareness), do not use alternative sites. Use your finger instead. Alternative sites may not be as accurate as the fingers, because blood flow is slower in these areas. This means that the result you get may be delayed, and it may be different from the result that you would get from your finger. Follow these instructions at home: Blood glucose log   Every time you check your blood glucose, write down your result. Also write down any notes about things that may be affecting your blood glucose, such as your diet and exercise for the day. This information can help you and your health care provider: ? Look for patterns in your  blood glucose over time. ? Adjust your diabetes management plan as needed.  Check if your meter allows you to download your records to a computer. Most glucose meters store a record of glucose readings in the meter. If you have type 1 diabetes:  Check your blood glucose 2 or more times a day.  Also check your blood glucose: ? Before every insulin injection. ? Before and after exercise. ? Before meals. ? 2 hours  after a meal. ? Occasionally between 2:00 a.m. and 3:00 a.m., as directed. ? Before potentially dangerous tasks, like driving or using heavy machinery. ? At bedtime.  You may need to check your blood glucose more often, up to 6-10 times a day, if you: ? Use an insulin pump. ? Need multiple daily injections (MDI). ? Have diabetes that is not well-controlled. ? Are ill. ? Have a history of severe hypoglycemia. ? Have hypoglycemia unawareness. If you have type 2 diabetes:  If you take insulin or other diabetes medicines, check your blood glucose 2 or more times a day.  If you are on intensive insulin therapy, check your blood glucose 4 or more times a day. Occasionally, you may also need to check between 2:00 a.m. and 3:00 a.m., as directed.  Also check your blood glucose: ? Before and after exercise. ? Before potentially dangerous tasks, like driving or using heavy machinery.  You may need to check your blood glucose more often if: ? Your medicine is being adjusted. ? Your diabetes is not well-controlled. ? You are ill. General tips  Always keep your supplies with you.  If you have questions or need help, all blood glucose meters have a 24-hour "hotline" phone number that you can call. You may also contact your health care provider.  After you use a few boxes of test strips, adjust (calibrate) your blood glucose meter by following instructions that came with your meter. Contact a health care provider if:  Your blood glucose is at or above 240 mg/dL (13.3 mmol/L)  for 2 days in a row.  You have been sick or have had a fever for 2 days or longer, and you are not getting better.  You have any of the following problems for more than 6 hours: ? You cannot eat or drink. ? You have nausea or vomiting. ? You have diarrhea. Get help right away if:  Your blood glucose is lower than 54 mg/dL (3 mmol/L).  You become confused or you have trouble thinking clearly.  You have difficulty breathing.  You have moderate or large ketone levels in your urine. Summary  Monitoring your blood sugar (glucose) is an important part of managing your diabetes (diabetes mellitus).  Blood glucose monitoring involves checking your blood glucose as often as directed and keeping a record (log) of your results over time.  Your health care provider will set individualized treatment goals for you. Your goals will be based on your age, other medical conditions you have, and how you respond to diabetes treatment.  Every time you check your blood glucose, write down your result. Also write down any notes about things that may be affecting your blood glucose, such as your diet and exercise for the day. This information is not intended to replace advice given to you by your health care provider. Make sure you discuss any questions you have with your health care provider. Document Revised: 03/11/2018 Document Reviewed: 10/28/2015 Elsevier Patient Education  [place]. Hypoglycemia Hypoglycemia is when the sugar (glucose) level in your blood is too low. Signs of low blood sugar may include:  Feeling: ? Hungry. ? Worried or nervous (anxious). ? Sweaty and clammy. ? Confused. ? Dizzy. ? Sleepy. ? Sick to your stomach (nauseous).  Having: ? A fast heartbeat. ? A headache. ? A change in your vision. ? Tingling or no feeling (numbness) around your mouth, lips, or tongue. ? Jerky movements that you cannot control (seizure).  Having trouble with: ?  Moving  (coordination). ? Sleeping. ? Passing out (fainting). ? Getting upset easily (irritability). Low blood sugar can happen to people who have diabetes and people who do not have diabetes. Low blood sugar can happen quickly, and it can be an emergency. Treating low blood sugar Low blood sugar is often treated by eating or drinking something sugary right away, such as:  Fruit juice, 4-6 oz (120-150 mL).  Regular soda (not diet soda), 4-6 oz (120-150 mL).  Low-fat milk, 4 oz (120 mL).  Several pieces of hard candy.  Sugar or honey, 1 Tbsp (15 mL). Treating low blood sugar if you have diabetes If you can think clearly and swallow safely, follow the 15:15 rule:  Take 15 grams of a fast-acting carb (carbohydrate). Talk with your doctor about how much you should take.  Always keep a source of fast-acting carb with you, such as: ? Sugar tablets (glucose pills). Take 3-4 pills. ? 6-8 pieces of hard candy. ? 4-6 oz (120-150 mL) of fruit juice. ? 4-6 oz (120-150 mL) of regular (not diet) soda. ? 1 Tbsp (15 mL) honey or sugar.  Check your blood sugar 15 minutes after you take the carb.  If your blood sugar is still at or below 70 mg/dL (3.9 mmol/L), take 15 grams of a carb again.  If your blood sugar does not go above 70 mg/dL (3.9 mmol/L) after 3 tries, get help right away.  After your blood sugar goes back to normal, eat a meal or a snack within 1 hour.  Treating very low blood sugar If your blood sugar is at or below 54 mg/dL (3 mmol/L), you have very low blood sugar (severe hypoglycemia). This may also cause:  Passing out.  Jerky movements you cannot control (seizure).  Losing consciousness (coma). This is an emergency. Do not wait to see if the symptoms will go away. Get medical help right away. Call your local emergency services (911 in the [place] Do not drive yourself to the hospital. If you have very low blood sugar and you cannot eat or drink, you may need a glucagon shot  (injection). A family member or friend should learn how to check your blood sugar and how to give you a glucagon shot. Ask your doctor if you need to have a glucagon shot kit at home. Follow these instructions at home: General instructions  Take over-the-counter and prescription medicines only as told by your doctor.  Stay aware of your blood sugar as told by your doctor.  Limit alcohol intake to no more than 1 drink a day for nonpregnant women and 2 drinks a day for men. One drink equals 12 oz of beer (355 mL), 5 oz of wine (148 mL), or 1 oz of hard liquor (44 mL).  Keep all follow-up visits as told by your doctor. This is important. If you have diabetes:   Follow your diabetes care plan as told by your doctor. Make sure you: ? Know the signs of low blood sugar. ? Take your medicines as told. ? Follow your exercise and meal plan. ? Eat on time. Do not skip meals. ? Check your blood sugar as often as told by your doctor. Always check it before and after exercise. ? Follow your sick day plan when you cannot eat or drink normally. Make this plan ahead of time with your doctor.  Share your diabetes care plan with: ? Your work or school. ? People you live with.  Check your pee (urine) for  ketones: ? When you are sick. ? As told by your doctor.  Carry a card or wear jewelry that says you have diabetes. Contact a doctor if:  You have trouble keeping your blood sugar in your target range.  You have low blood sugar often. Get help right away if:  You still have symptoms after you eat or drink something sugary.  Your blood sugar is at or below 54 mg/dL (3 mmol/L).  You have jerky movements that you cannot control.  You pass out. These symptoms may be an emergency. Do not wait to see if the symptoms will go away. Get medical help right away. Call your local emergency services (911 in the [place] Do not drive yourself to the hospital. Summary  Hypoglycemia happens when the level  of sugar (glucose) in your blood is too low.  Low blood sugar can happen to people who have diabetes and people who do not have diabetes. Low blood sugar can happen quickly, and it can be an emergency.  Make sure you know the signs of low blood sugar and know how to treat it.  Always keep a source of sugar (fast-acting carb) with you to treat low blood sugar. This information is not intended to replace advice given to you by your health care provider. Make sure you discuss any questions you have with your health care provider. Document Revised: 09/08/2018 Document Reviewed: 06/21/2015 Elsevier Patient Education  [place]. Hyperglycemia Hyperglycemia occurs when the level of sugar (glucose) in the blood is too high. Glucose is a type of sugar that provides the body's main source of energy. Certain hormones (insulin and glucagon) control the level of glucose in the blood. Insulin lowers blood glucose, and glucagon increases blood glucose. Hyperglycemia can result from having too little insulin in the bloodstream, or from the body not responding normally to insulin. Hyperglycemia occurs most often in people who have diabetes (diabetes mellitus), but it can happen in people who do not have diabetes. It can develop quickly, and it can be life-threatening if it causes you to become severely dehydrated (diabetic ketoacidosis or hyperglycemic hyperosmolar state). Severe hyperglycemia is a medical emergency. What are the causes? If you have diabetes, hyperglycemia may be caused by:  Diabetes medicine.  Medicines that increase blood glucose or affect your diabetes control.  Not eating enough, or not eating often enough.  Changes in physical activity level.  Being sick or having an infection. If you have prediabetes or undiagnosed diabetes:  Hyperglycemia may be caused by those conditions. If you do not have diabetes, hyperglycemia may be caused by:  Certain medicines, including steroid  medicines, beta-blockers, epinephrine, and thiazide diuretics.  Stress.  Serious illness.  Surgery.  Diseases of the pancreas.  Infection. What increases the risk? Hyperglycemia is more likely to develop in people who have risk factors for diabetes, such as:  Having a family member with diabetes.  Having a gene for type 1 diabetes that is passed from parent to child (inherited).  Living in an area with cold weather conditions.  Exposure to certain viruses.  Certain conditions in which the body's disease-fighting (immune) system attacks itself (autoimmune disorders).  Being overweight or obese.  Having an inactive (sedentary) lifestyle.  Having been diagnosed with insulin resistance.  Having a history of prediabetes, gestational diabetes, or polycystic ovarian syndrome (PCOS).  Being of American-Indian, African-American, Hispanic/Latino, or Asian/Pacific Islander descent. What are the signs or symptoms? Hyperglycemia may not cause any symptoms. If you do have  symptoms, they may include early warning signs, such as:  Increased thirst.  Hunger.  Feeling very tired.  Needing to urinate more often than usual.  Blurry vision. Other symptoms may develop if hyperglycemia gets worse, such as:  Dry mouth.  Loss of appetite.  Fruity-smelling breath.  Weakness.  Unexpected or rapid weight gain or weight loss.  Tingling or numbness in the hands or feet.  Headache.  Skin that does not quickly return to normal after being lightly pinched and released (poor skin turgor).  Abdominal pain.  Cuts or bruises that are slow to heal. How is this diagnosed? Hyperglycemia is diagnosed with a blood test to measure your blood glucose level. This blood test is usually done while you are having symptoms. Your health care provider may also do a physical exam and review your medical history. You may have more tests to determine the cause of your hyperglycemia, such as:  A  fasting blood glucose (FBG) test. You will not be allowed to eat (you will fast) for at least 8 hours before a blood sample is taken.  An A1c (hemoglobin A1c) blood test. This provides information about blood glucose control over the previous 2-3 months.  An oral glucose tolerance test (OGTT). This measures your blood glucose at two times: ? After fasting. This is your baseline blood glucose level. ? Two hours after drinking a beverage that contains glucose. How is this treated? Treatment depends on the cause of your hyperglycemia. Treatment may include:  Taking medicine to regulate your blood glucose levels. If you take insulin or other diabetes medicines, your medicine or dosage may be adjusted.  Lifestyle changes, such as exercising more, eating healthier foods, or losing weight.  Treating an illness or infection, if this caused your hyperglycemia.  Checking your blood glucose more often.  Stopping or reducing steroid medicines, if these caused your hyperglycemia. If your hyperglycemia becomes severe and it results in hyperglycemic hyperosmolar state, you must be hospitalized and given IV fluids. Follow these instructions at home:  General instructions  Take over-the-counter and prescription medicines only as told by your health care provider.  Do not use any products that contain nicotine or tobacco, such as cigarettes and e-cigarettes. If you need help quitting, ask your health care provider.  Limit alcohol intake to no more than 1 drink per day for nonpregnant women and 2 drinks per day for men. One drink equals 12 oz of beer, 5 oz of wine, or 1 oz of hard liquor.  Learn to manage stress. If you need help with this, ask your health care provider.  Keep all follow-up visits as told by your health care provider. This is important. Eating and drinking   Maintain a healthy weight.  Exercise regularly, as directed by your health care provider.  Stay hydrated, especially when  you exercise, get sick, or spend time in hot temperatures.  Eat healthy foods, such as: ? Lean proteins. ? Complex carbohydrates. ? Fresh fruits and vegetables. ? Low-fat dairy products. ? Healthy fats.  Drink enough fluid to keep your urine clear or pale yellow. If you have diabetes:  Make sure you know the symptoms of hyperglycemia.  Follow your diabetes management plan, as told by your health care provider. Make sure you: ? Take your insulin and medicines as directed. ? Follow your exercise plan. ? Follow your meal plan. Eat on time, and do not skip meals. ? Check your blood glucose as often as directed. Make sure to check your  blood glucose before and after exercise. If you exercise longer or in a different way than usual, check your blood glucose more often. ? Follow your sick day plan whenever you cannot eat or drink normally. Make this plan in advance with your health care provider.  Share your diabetes management plan with people in your workplace, school, and household.  Check your urine for ketones when you are ill and as told by your health care provider.  Carry a medical alert card or wear medical alert jewelry. Contact a health care provider if:  Your blood glucose is at or above 240 mg/dL (13.3 mmol/L) for 2 days in a row.  You have problems keeping your blood glucose in your target range.  You have frequent episodes of hyperglycemia. Get help right away if:  You have difficulty breathing.  You have a change in how you think, feel, or act (mental status).  You have nausea or vomiting that does not go away. These symptoms may represent a serious problem that is an emergency. Do not wait to see if the symptoms will go away. Get medical help right away. Call your local emergency services (911 in the [place] Do not drive yourself to the hospital. Summary  Hyperglycemia occurs when the level of sugar (glucose) in the blood is too high.  Hyperglycemia is diagnosed  with a blood test to measure your blood glucose level. This blood test is usually done while you are having symptoms. Your health care provider may also do a physical exam and review your medical history.  If you have diabetes, follow your diabetes management plan as told by your health care provider.  Contact your health care provider if you have problems keeping your blood glucose in your target range. This information is not intended to replace advice given to you by your health care provider. Make sure you discuss any questions you have with your health care provider. Document Revised: 02/03/2016 Document Reviewed: 02/03/2016 Elsevier Patient Education  [place]. Hemoglobin A1c Test Why am I having this test? You may have the hemoglobin A1c test (HbA1c test) done to:  Evaluate your risk for developing diabetes (diabetes mellitus).  Diagnose diabetes.  Monitor long-term control of blood sugar (glucose) in people who have diabetes and help make treatment decisions. This test may be done with other blood glucose tests, such as fasting blood glucose and oral glucose tolerance tests. What is being tested? Hemoglobin is a type of protein in the blood that carries oxygen. Glucose attaches to hemoglobin to form glycated hemoglobin. This test checks the amount of glycated hemoglobin in your blood, which is a good indicator of the average amount of glucose in your blood during the past 2-3 months. What kind of sample is taken?  A blood sample is required for this test. It is usually collected by inserting a needle into a blood vessel. Tell a health care provider about:  All medicines you are taking, including vitamins, herbs, eye drops, creams, and over-the-counter medicines.  Any blood disorders you have.  Any surgeries you have had.  Any medical conditions you have.  Whether you are pregnant or may be pregnant. How are the results reported? Your results will be reported as a  percentage that indicates how much of your hemoglobin has glucose attached to it (is glycated). Your health care provider will compare your results to normal ranges that were established after testing a large group of people (reference ranges). Reference ranges may vary among labs  and hospitals. For this test, common reference ranges are:  Adult or child without diabetes: 4-5.6%.  Adult or child with diabetes and good blood glucose control: less than 7%. What do the results mean? If you have diabetes:  A result of less than 7% is considered normal, meaning that your blood glucose is well controlled.  A result higher than 7% means that your blood glucose is not well controlled, and your treatment plan may need to be adjusted. If you do not have diabetes:  A result within the reference range is considered normal, meaning that you are not at high risk for diabetes.  A result of 5.7-6.4% means that you have a high risk of developing diabetes, and you may have prediabetes. Prediabetes is the condition of having a blood glucose level that is higher than it should be, but not high enough for you to be diagnosed with diabetes. Having prediabetes puts you at risk for developing type 2 diabetes (type 2 diabetes mellitus). You may have more tests, including a repeat HbA1c test.  Results of 6.5% or higher on two separate HbA1c tests mean that you have diabetes. You may have more tests to confirm the diagnosis. Abnormally low HbA1c values may be caused by:  Pregnancy.  Severe blood loss.  Receiving donated blood (transfusions).  Low red blood cell count (anemia).  Long-term kidney failure.  Some unusual forms (variants) of hemoglobin. Talk with your health care provider about what your results mean. Questions to ask your health care provider Ask your health care provider, or the department that is doing the test:  When will my results be ready?  How will I get my results?  What are my  treatment options?  What other tests do I need?  What are my next steps? Summary  The hemoglobin A1c test (HbA1c test) may be done to evaluate your risk for developing diabetes, to diagnose diabetes, and to monitor long-term control of blood sugar (glucose) in people who have diabetes and help make treatment decisions.  Hemoglobin is a type of protein in the blood that carries oxygen. Glucose attaches to hemoglobin to form glycated hemoglobin. This test checks the amount of glycated hemoglobin in your blood, which is a good indicator of the average amount of glucose in your blood during the past 2-3 months.  Talk with your health care provider about what your results mean. This information is not intended to replace advice given to you by your health care provider. Make sure you discuss any questions you have with your health care provider. Document Revised: 04/30/2017 Document Reviewed: 12/29/2016 Elsevier Patient Education  [place].

## 2019-12-14 NOTE — Progress Notes (Signed)
Occupational Therapy Evaluation Patient Details Name: Courtney Bennett MRN: [id number] DOB: 01-Sep-1965 Today's Date: 12/14/2019    History of Present Illness 54 yo found by family on floor with dark emesis with AMS eating cigarette butts. Pt in DKA with Aflutter with RVR. Intubated 7/11-7/12. PMHx: bipolar, DM, HTN, fibromyalgia   Clinical Impression   PTA, pt lived with her 46 yo son, does not work and was independent with ADL, IADL, including driving, and mobility. Used a cane for community ambulation. Pt able to ambulate @ unit with +2  Min A/HHA; min A for LB ADL. Pt with flat affect and is oriented but do not feel she is at her baseline cognitively due to slow processing and apparent memory deficits. Pt reports having difficulty remembering to take her medication or taking her medication "as she should" PTA. Pt states " I know I need to take better care of myself because I don't want this to happen again". Pt reports her sister may be able to come from [place] to stay with her for awhile, which would be optimum for safe DC. At this time, recommend DC home with [facility] and initial 24/7 S. Will follow acutely.     Follow Up Recommendations  Home health OT;Supervision/Assistance - 24 hour (initially)    Equipment Recommendations  None recommended by OT    Recommendations for Other Services       Precautions / Restrictions Precautions Precautions: Fall Restrictions Weight Bearing Restrictions: No      Mobility Bed Mobility Overal bed mobility: Needs Assistance Bed Mobility: Supine to Sit     Supine to sit: Min guard        Transfers Overall transfer level: Needs assistance Equipment used: 2 person hand held assist Transfers: Sit to/from Stand Sit to Stand: Min assist;+2 safety/equipment         General transfer comment: unsteady at times    Balance Overall balance assessment: Needs assistance   Sitting balance-Leahy Scale: Fair       Standing balance-Leahy Scale:  Poor                             ADL either performed or assessed with clinical judgement   ADL Overall ADL's : Needs assistance/impaired Eating/Feeding: Modified independent   Grooming: Set up;Sitting   Upper Body Bathing: Set up;Sitting   Lower Body Bathing: Minimal assistance;Sit to/from stand   Upper Body Dressing : Set up;Sitting   Lower Body Dressing: Minimal assistance;Sit to/from stand   Toilet Transfer: Minimal assistance;Ambulation   Toileting- Clothing Manipulation and Hygiene: Min guard;Sitting/lateral lean       Functional mobility during ADLs: Minimal assistance;+2 for safety/equipment;Cueing for safety General ADL Comments: Unsteady; fatigues easily. Unable to stand at sink to brush teeth after ambulating around unit due to fatigue     Vision Baseline Vision/History: Wears glasses Patient Visual Report:  (per pt MD OS) Additional Comments: will further assess; has been seen by opthamology as inpt; ? endophthalmitis     Perception     Praxis      Pertinent Vitals/Pain Faces Pain Scale: Hurts little more Pain Location: abdomen Pain Descriptors / Indicators: Discomfort;Aching Pain Intervention(s): Limited activity within patient's tolerance     Hand Dominance Right   Extremity/Trunk Assessment Upper Extremity Assessment Upper Extremity Assessment: Generalized weakness   Lower Extremity Assessment Lower Extremity Assessment: Defer to PT evaluation   Cervical / Trunk Assessment Cervical / Trunk Assessment: Other exceptions (forward  head)   Communication Communication Communication: No difficulties   Cognition Arousal/Alertness: Awake/alert Behavior During Therapy: Flat affect Overall Cognitive Status: Impaired/Different from baseline Area of Impairment: Attention;Memory;Safety/judgement;Awareness;Problem solving                   Current Attention Level: Selective Memory: Decreased short-term memory   Safety/Judgement:  Decreased awareness of deficits Awareness: Emergent Problem Solving: Slow processing General Comments: Reports some difficulty remembering; poor compliance with meds (poor eye contact; more interactive once up and ambulating)   General Comments  VSS throughout session on RA    Exercises     Shoulder Instructions      Home Living Family/patient expects to be discharged to:: Private residence Living Arrangements: Children Available Help at Discharge: Family Type of Home: Mobile home Home Access: Stairs to enter Technical brewer of Steps: 3   Home Layout: One level     Bathroom Shower/Tub: Occupational psychologist: Standard     Home Equipment: Cane - single point;Shower seat          Prior Functioning/Environment Level of Independence: Independent with assistive device(s)        Comments: uses cane for community ambulation        OT Problem List: Decreased strength;Decreased activity tolerance;Impaired balance (sitting and/or standing);Impaired vision/perception;Decreased cognition;Decreased safety awareness;Decreased knowledge of use of DME or AE;Cardiopulmonary status limiting activity;Pain      OT Treatment/Interventions: Self-care/ADL training;Therapeutic exercise;Energy conservation;DME and/or AE instruction;Therapeutic activities;Cognitive remediation/compensation;Patient/family education;Visual/perceptual remediation/compensation;Balance training    OT Goals(Current goals can be found in the care plan section) Acute Rehab OT Goals Patient Stated Goal: to take better care of herself OT Goal Formulation: With patient Time For Goal Achievement: 12/28/19 Potential to Achieve Goals: Good  OT Frequency: Min 2X/week   Barriers to D/C:            Co-evaluation PT/OT/SLP Co-Evaluation/Treatment: Yes Reason for Co-Treatment: To address functional/ADL transfers;For patient/therapist safety   OT goals addressed during session: ADL's and  self-care      AM-PAC OT "6 Clicks" Daily Activity     Outcome Measure Help from another person eating meals?: None Help from another person taking care of personal grooming?: A Little Help from another person toileting, which includes using toliet, bedpan, or urinal?: A Little Help from another person bathing (including washing, rinsing, drying)?: A Little Help from another person to put on and taking off regular upper body clothing?: A Little Help from another person to put on and taking off regular lower body clothing?: A Little 6 Click Score: 19   End of Session Equipment Utilized During Treatment: Gait belt Nurse Communication: Mobility status  Activity Tolerance: Patient tolerated treatment well Patient left: in chair;with call bell/phone within reach;with chair alarm set  OT Visit Diagnosis: Unsteadiness on feet (R26.81);Muscle weakness (generalized) (M62.81);Other symptoms and signs involving cognitive function;Pain Pain - part of body:  (abdomen)                Time: 4259-5638 OT Time Calculation (min): 23 min Charges:  OT General Charges $OT Visit: 1 Visit OT Evaluation $OT Eval Moderate Complexity: [place], OT/L   Acute OT Clinical Specialist Acute Rehabilitation Services Pager [phone] Office [phone]   [facility] 12/14/2019, 10:16 AM

## 2019-12-14 NOTE — Progress Notes (Signed)
CSW received notification from patient's RN that the patient may need placement at discharge. CSW met with patient at bedside to discuss PT recommendations for SNF if 24/7 care is not available at home. Patient reports she lives at home with her 54 year old son who helps her care for herself. Patient states she does not want to go to a facility at discharge but is willing to receive [facility] services. Patient reports her brother Elta Guadeloupe told her that her sister Lattie Haw would be willing to come stay with her.   RN informed CSW that she had a discussion with the patient's sister who states she lives in [place]. CSW will contact patient's sister to discuss if she is able to come to [place] to be with her sister.  Madilyn Fireman, MSW, LCSW-A Transitions of Care  Clinical Social Worker  [facility] Emergency Departments  Medical ICU [phone]

## 2019-12-15 LAB — COMPREHENSIVE METABOLIC PANEL
ALT: 15 U/L (ref 0–44)
AST: 13 U/L — ABNORMAL LOW (ref 15–41)
Albumin: 1.8 g/dL — ABNORMAL LOW (ref 3.5–5.0)
Alkaline Phosphatase: 60 U/L (ref 38–126)
Anion gap: 8 (ref 5–15)
BUN: 8 mg/dL (ref 6–20)
CO2: 27 mmol/L (ref 22–32)
Calcium: 8.6 mg/dL — ABNORMAL LOW (ref 8.9–10.3)
Chloride: 104 mmol/L (ref 98–111)
Creatinine, Ser: 0.42 mg/dL — ABNORMAL LOW (ref 0.44–1.00)
GFR calc Af Amer: >60 mL/min (ref >60)
GFR calc non Af Amer: >60 mL/min (ref >60)
Glucose, Bld: 162 mg/dL — ABNORMAL HIGH (ref 70–99)
Potassium: 3.6 mmol/L (ref 3.5–5.1)
Sodium: 139 mmol/L (ref 135–145)
Total Bilirubin: 0.6 mg/dL (ref 0.3–1.2)
Total Protein: 4.5 g/dL — ABNORMAL LOW (ref 6.5–8.1)

## 2019-12-15 LAB — MAGNESIUM
Magnesium: 1.8 mg/dL (ref 1.7–2.4)
Magnesium: 1.7 mg/dL (ref 1.7–2.4)

## 2019-12-15 LAB — GLUCOSE, CAPILLARY
Glucose-Capillary: 143 mg/dL — ABNORMAL HIGH (ref 70–99)
Glucose-Capillary: 159 mg/dL — ABNORMAL HIGH (ref 70–99)
Glucose-Capillary: 121 mg/dL — ABNORMAL HIGH (ref 70–99)

## 2019-12-15 MED ORDER — FLUCONAZOLE 200 MG PO TABS: 800.0000 mg | ORAL_TABLET | Freq: Every day | ORAL | 0 refills | Status: DC

## 2019-12-15 MED ORDER — BLOOD GLUCOSE METER KIT: PACK | 0 refills | Status: DC

## 2019-12-15 MED ORDER — INSULIN DETEMIR 100 UNIT/ML FLEXPEN
6.0000 [IU] | PEN_INJECTOR | Freq: Two times a day (BID) | SUBCUTANEOUS | 11 refills | Status: DC
Start: 2019-12-15 — End: 2022-12-01

## 2019-12-15 MED ORDER — ATORVASTATIN CALCIUM 20 MG PO TABS: 20.0000 mg | ORAL_TABLET | Freq: Every day | ORAL | 4 refills | Status: DC

## 2019-12-15 MED ORDER — INSULIN PEN NEEDLE 32G X 4 MM MISC: 6.0000 [IU] | Freq: Two times a day (BID) | 4 refills | Status: DC

## 2019-12-15 NOTE — Social Work (Signed)
CSW was unable to arrange [facility] services for pt due to insurance. CSW reached out to pt via phone with no answer.   Emeterio Reeve, Latanya Presser, Mutual Social Worker [phone]

## 2019-12-15 NOTE — Progress Notes (Signed)
Patient ambulated to nurse's station and back to room.  Oxygen saturation remained 92-95% during ambulation.  Patient tolerated well.

## 2019-12-15 NOTE — Progress Notes (Signed)
No change from original shift assessment.  Patient sleeping comfortably, vss.  0500  transfer patient to 2w08 via wheelchair, monitor, and 2L 02 without incident.  Patient transferred self from wheelchair to bed with minimal assist

## 2019-12-15 NOTE — Progress Notes (Addendum)
Inpatient Diabetes Program Recommendations  AACE/ADA: New Consensus Statement on Inpatient Glycemic Control (2015)  Target Ranges:  Prepandial:   less than 140 mg/dL      Peak postprandial:   less than 180 mg/dL (1-2 hours)      Critically ill patients:  140 - 180 mg/dL   Lab Results  Component Value Date   GLUCAP 159 (H) 12/15/2019   HGBA1C 15.2 (H) 12/12/2019    Review of Glycemic Control Results for AARIEL, EMS (MRN [id number]) as of 12/15/2019 09:03  Ref. Range 12/14/2019 19:28 12/14/2019 23:19 12/15/2019 03:22 12/15/2019 07:32  Glucose-Capillary Latest Ref Range: 70 - 99 mg/dL 156 (H) 147 (H) 143 (H) 159 (H)   Diabetes history: Type 2 DM Outpatient Diabetes medications: Farxiga 1.5 mg Qwk, Glipizide 5 mg BID Current orders for Inpatient glycemic control: Levemir 5 units BID, Novolog 0-9 units Q4H  Inpatient Diabetes Program Recommendations:    In preparation for discharge, consider increasing Levemir slightly to 6 units BID. If to remain inpatient, change correction to TID & HS. Will plan to see patient again today.  Patient will need a meter at discharge. Blood glucose meter (inlcudes lancets and strips) (#[id number]).  Addendum: Spoke with patient again and reviewed concepts discussed yesterday. Further reviewed survival skills, signs and symptoms of hyper vs hypo glycemia, interventions, when to call MD, importance of CHO intake mindfulness, sick day rules, when to follow up with [facility], how to reach [employer] for further Freestyle Libres. No further questions at this time.  Freestyle Libre applied to left upper arm.   Thanks, Bronson Curb, MSN, RNC-OB Diabetes Coordinator [phone] (8a-5p)

## 2019-12-15 NOTE — Discharge Summary (Signed)
Physician Discharge Summary  Courtney Bennett:[id number] DOB: 06/23/1965 DOA: 12/09/2019  PCP: [facility]  Admit date: 12/09/2019 Discharge date: 12/15/2019  Admitted From: Home Disposition: Home Recommendations for Outpatient Follow-up:  1. Follow up with PCP in 1-2 weeks 2. Please obtain BMP/CBC in one week 3. Please follow up on the following pending results:  Home Health: PT Equipment/Devices none Discharge Condition stable CODE STATUS: Full code Diet recommendation: Cardiac carb modified Brief/Interim Summary:54 year old female with history of poorly controlled type 2 diabetes, bipolar disorder, fibromyalgia, hypertension, goiter found by family confused on the floor covered with coffee-ground emesis. Upon admission her glucose was over 1100 with a gap of 44 and a bicarb of 7. She was found to have severe leukocytosis 30 4K, creatinine was 2.9 lactic acid was 3.0 with a pH of 7.2/74/.  She was started on insulin drip. She was started on Vanco and cefepime. CT of the abdomen and pelvis showed distended stomach CT head with no acute findings   Discharge Diagnoses:  Principal Problem:   Severe sepsis with acute organ dysfunction ([place]) Active Problems:   Bipolar affective disorder, current episode hypomanic ([place])   Paranoid ([place])   DKA (diabetic ketoacidoses) ([facility])   Altered mental status   Coffee ground emesis   AKI (acute kidney injury) ([place])   Leukocytosis   Elevated serum hCG   Aspiration into airway   Paroxysmal atrial flutter ([facility])   Pressure Injury 12/11/19 Sacrum Upper;Right Deep Tissue Pressure Injury - Purple or maroon localized area of discolored intact skin or blood-filled blister due to damage of underlying soft tissue from pressure and/or shear. (Active)  12/11/19 0607  Location: Sacrum  Location Orientation: Upper;Right  Staging: Deep Tissue Pressure Injury - Purple or maroon localized area of discolored intact skin or blood-filled blister  due to damage of underlying soft tissue from pressure and/or shear.  Wound Description (Comments):   Present on Admission:    #1 septic shock/Candida albicans bacteremia-patient was seen by infectious disease and was treated with Diflucan.  She will be discharged on Diflucan 800 mg daily for another 11 days to finish a course of 2 weeks.  Repeat blood cultures have been negative. Seen by ophthalmology 12/12/2019.  No evidence of chorioretinitis infiltrates or vitritis no evidence of fungal endophthalmitis per exam.  #2 status post DKA with poorly controlled type 2 diabetes complicated by neuropathy with A1c 15.2.  Will discharge patient on Levemir 6 units twice a day.  On Neurontin for neuropathy.  #3 dysphagia -followed by speech therapy.  MBS done 12/13/2019.  Recommending regular texture thin liquids.   #4 status post acute hypoxic respiratory failure likely secondary to aspiration patient was found with coffee-ground emesis and confused at home prior to EMS picking her up.  Patient was intubated.  No further emesis since admission.  Continue Pepcid or Prilosec as prior to admission.  #5 history of bipolar disorder with hallucinations.  Continue home medications.  #6 AKI resolved due to severe dehydration and DKA.  #7 question multinodular goiter needs outpatient follow-up  #8 mildly elevated hCG patient has had hysterectomy.  #9 mild hypokalemia potassium 3.5 magnesium 1.9.  #10 depression restart home medications.  She is on multiple medications including Wellbutrin and Zoloft Cymbalta and Neurontin.  I have stopped Cymbalta.    Estimated body mass index is 21.57 kg/m as calculated from the following:   Height as of this encounter: 5' 4"  (1.626 m).   Weight as of this encounter: 57 kg.  Discharge Instructions   Allergies as of 12/15/2019      Reactions   Metformin Diarrhea   Lamictal [lamotrigine] Rash   Latex Itching, Rash   Sulfa Antibiotics Rash   Sulfur Hives,  Rash, Other (See Comments)   "Burns the skin," also   Tape Rash, Other (See Comments)   Irritates the skin      Medication List    STOP taking these medications   cephALEXin 500 MG capsule Commonly known as: KEFLEX   DULoxetine 60 MG capsule Commonly known as: CYMBALTA   glipiZIDE 5 MG tablet Commonly known as: GLUCOTROL   metoCLOPramide 10 MG tablet Commonly known as: Reglan   predniSONE 10 MG tablet Commonly known as: DELTASONE   telmisartan 40 MG tablet Commonly known as: MICARDIS   Trulicity 1.5 FS/1.4EL Sopn Generic drug: Dulaglutide   valACYclovir 500 MG tablet Commonly known as: VALTREX     TAKE these medications   Accu-Chek Aviva Plus test strip Generic drug: glucose blood 1 each by Other route daily. And lancets 2/day   albuterol 108 (90 Base) MCG/ACT inhaler Commonly known as: VENTOLIN HFA Inhale 1 puff into the lungs as needed.   atorvastatin 20 MG tablet Commonly known as: LIPITOR Take 1 tablet (20 mg total) by mouth daily.   blood glucose meter kit and supplies Dispense based on patient and insurance preference. Use up to four times daily as directed. (FOR ICD-10 E10.9, E11.9).   budesonide-formoterol 160-4.5 MCG/ACT inhaler Commonly known as: SYMBICORT Inhale 2 puffs into the lungs 2 (two) times daily.   chlorhexidine 0.12 % solution Commonly known as: PERIDEX as directed.   [employer] B-12 500 MCG tablet Generic drug: vitamin B-12 Take 500 mcg by mouth daily.   estrogens (conjugated) 0.3 MG tablet Commonly known as: Premarin Take 1 tablet (0.3 mg total) by mouth daily.   Farxiga 10 MG Tabs tablet Generic drug: dapagliflozin propanediol Take 10 mg by mouth daily before breakfast.   fluconazole 200 MG tablet Commonly known as: DIFLUCAN Take 4 tablets (800 mg total) by mouth daily.   FreeStyle Libre 14 Day Sensor Misc 1 application by Subdermal route See admin instructions. Check blood glucose 1-2 times daily. E11.65   gabapentin 300  MG capsule Commonly known as: NEURONTIN Take 300 mg by mouth 2 (two) times daily.   insulin detemir 100 UNIT/ML FlexPen Commonly known as: LEVEMIR Inject 6 Units into the skin 2 (two) times daily.   Insulin Pen Needle 32G X 4 MM Misc 6 Units by Does not apply route 2 (two) times daily.   ketoconazole 2 % shampoo Commonly known as: NIZORAL APPLY 1 APPLICATION TOPICALLY 2 (TWO) TIMES A WEEK.   lisinopril 40 MG tablet Commonly known as: ZESTRIL Take 40 mg by mouth daily.   omeprazole 40 MG capsule Commonly known as: PRILOSEC Take 40 mg by mouth daily.   sertraline 100 MG tablet Commonly known as: ZOLOFT Take 1.5 tablets (150 mg total) by mouth daily.   traMADol 50 MG tablet Commonly known as: ULTRAM Take 50 mg by mouth 3 (three) times daily as needed for moderate pain.   venlafaxine XR 75 MG 24 hr capsule Commonly known as: EFFEXOR-XR Take 1 capsule (75 mg total) by mouth daily with breakfast.       [facility] Follow up.   Contact information: [place] [address] [phone]              Allergies  Allergen Reactions  . Metformin Diarrhea  . Lamictal [Lamotrigine] Rash  . Latex Itching and Rash  . Sulfa Antibiotics Rash  . Sulfur Hives, Rash and Other (See Comments)    "Burns the skin," also  . Tape Rash and Other (See Comments)    Irritates the skin    Consultations:  pccm   Procedures/Studies: CT ABDOMEN PELVIS WO CONTRAST  Result Date: 12/10/2019 CLINICAL DATA:  Abdominal pain with coffee ground emesis. EXAM: CT ABDOMEN AND PELVIS WITHOUT CONTRAST TECHNIQUE: Multidetector CT imaging of the abdomen and pelvis was performed following the standard protocol without IV contrast. COMPARISON:  December 06, 2019 FINDINGS: Lower chest: Mild patchy areas of atelectasis and/or infiltrate are seen within the bilateral lung bases. Hepatobiliary: No focal liver abnormality is seen. No gallstones, gallbladder  wall thickening, or biliary dilatation. Pancreas: Unremarkable. No pancreatic ductal dilatation or surrounding inflammatory changes. Spleen: Normal in size without focal abnormality. Adrenals/Urinary Tract: Adrenal glands are unremarkable. Kidneys are normal, without renal calculi, focal lesion, or hydronephrosis. Bladder is unremarkable. Stomach/Bowel: The stomach is markedly distended. There is a small hiatal hernia. Appendix appears normal. No evidence of bowel wall thickening, distention, or inflammatory changes. Noninflamed diverticula are seen within the proximal sigmoid colon. Vascular/Lymphatic: There is moderate severity calcification of the abdominal aorta and bilateral common iliac arteries. No enlarged abdominal or pelvic lymph nodes. Reproductive: Status post hysterectomy. No adnexal masses. Other: No abdominal wall hernia or abnormality. No abdominopelvic ascites. Musculoskeletal: No acute or significant osseous findings. IMPRESSION: 1. Mild patchy areas of bibasilar atelectasis and/or infiltrate. 2. Small hiatal hernia. 3. Marked severity gastric distension. Sequelae associated with gastric outlet obstruction cannot be excluded. 4. Sigmoid diverticulosis. Aortic Atherosclerosis (ICD10-I70.0). Electronically Signed   By: Virgina Norfolk M.D.   On: 12/10/2019 00:10   DG Abd 1 View  Result Date: 12/10/2019 CLINICAL DATA:  Orogastric tube placement EXAM: ABDOMEN - 1 VIEW COMPARISON:  None. FINDINGS: Tip and side port of the orogastric tube project in the stomach. Nonobstructive bowel gas pattern. IMPRESSION: Orogastric tube tip in the stomach. Electronically Signed   By: Ulyses Jarred M.D.   On: 12/10/2019 05:27   CT Head Wo Contrast  Result Date: 12/09/2019 CLINICAL DATA:  Altered mental status. EXAM: CT HEAD WITHOUT CONTRAST TECHNIQUE: Contiguous axial images were obtained from the base of the skull through the vertex without intravenous contrast. COMPARISON:  None. FINDINGS: Brain: No  evidence of acute infarction, hemorrhage, hydrocephalus, extra-axial collection or mass lesion/mass effect. Vascular: No hyperdense vessel or unexpected calcification. Skull: Normal. Negative for fracture or focal lesion. Sinuses/Orbits: No acute finding. Other: None. IMPRESSION: No acute intracranial pathology. Electronically Signed   By: Virgina Norfolk M.D.   On: 12/09/2019 23:53   DG CHEST PORT 1 VIEW  Result Date: 12/10/2019 CLINICAL DATA:  Central line placement. EXAM: PORTABLE CHEST 1 VIEW COMPARISON:  Chest x-ray dated 12/09/2019. FINDINGS: Endotracheal tube appears well positioned with tip approximately 4 cm above the carina. Enteric tube passes below the diaphragm. LEFT IJ central line in place with tip adequately positioned at the level of the mid/upper SVC. Lungs are clear.  No pleural effusion or pneumothorax is seen. IMPRESSION: 1. LEFT IJ central line adequately positioned with tip at the level of the mid/upper SVC. No pneumothorax seen. 2. Endotracheal tube well positioned with tip approximately 4 cm above the carina. 3. Lungs are clear. Electronically Signed   By: Franki Cabot M.D.   On: 12/10/2019 05:35   DG Chest [facility]  Result Date: 12/09/2019 CLINICAL DATA:  Altered mental status. EXAM: PORTABLE CHEST 1 VIEW COMPARISON:  December 06, 2019 FINDINGS: There is no evidence of acute infiltrate, pleural effusion or pneumothorax. The heart size and mediastinal contours are within normal limits. The visualized skeletal structures are unremarkable. IMPRESSION: No active disease. Electronically Signed   By: Virgina Norfolk M.D.   On: 12/09/2019 22:41   DG Swallowing Func-Speech Pathology  Result Date: 12/13/2019 Objective Swallowing Evaluation: Type of Study: MBS-Modified Barium Swallow Study  Patient Details Name: Courtney Bennett MRN: [id number] Date of Birth: 23-Nov-1965 Today's Date: 12/13/2019 Time: SLP Start Time (ACUTE ONLY): 1330 -SLP Stop Time (ACUTE ONLY): 1344 SLP Time Calculation  (min) (ACUTE ONLY): 14 min Past Medical History: Past Medical History: Diagnosis Date . Bipolar 1 disorder ([place])  . Diabetes mellitus  . Fibromyalgia  . Hypertension  Past Surgical History: Past Surgical History: Procedure Laterality Date . ABDOMINAL HYSTERECTOMY  01/2019 . CESAREAN SECTION   . HERNIA REPAIR   HPI: 54 y.o. with PMH of DM, Bipolar Disorder, fibromyalgia and HTN who was found by family altered with coffee ground emesis. Found to be in DKA with continued confusion. Intubated 18 hours. CXR lungs are clear.  No data recorded Assessment / Plan / Recommendation CHL IP CLINICAL IMPRESSIONS 12/13/2019 Clinical Impression Pt exhibits an overall normal swallow although unable to orally transit barium pill with water. She expectorated pill and water into cup and did not attempt with puree although suspect she would not have had difficulty. Timing, coordination and motor strength were within normal limits to prevent aspiration with normal/appropriate, flash penetration with thin. Unremarkable esophageal scan. Pt coughed during study as she often does at bedside with or without po's. Recommend regular texture, thin liquids, straws allowed, pills whole in puree. ST will follow up briefly.    SLP Visit Diagnosis Dysphagia, unspecified (R13.10) Attention and concentration deficit following -- Frontal lobe and executive function deficit following -- Impact on safety and function Mild aspiration risk   CHL IP TREATMENT RECOMMENDATION 12/13/2019 Treatment Recommendations Therapy as outlined in treatment plan below   Prognosis 12/13/2019 Prognosis for Safe Diet Advancement Good Barriers to Reach Goals (No Data) Barriers/Prognosis Comment -- CHL IP DIET RECOMMENDATION 12/13/2019 SLP Diet Recommendations Regular solids;Thin liquid Liquid Administration via Cup;Straw Medication Administration Whole meds with puree Compensations Slow rate;Small sips/bites Postural Changes Seated upright at 90 degrees   CHL IP OTHER  RECOMMENDATIONS 12/13/2019 Recommended Consults -- Oral Care Recommendations Oral care BID Other Recommendations --   CHL IP FOLLOW UP RECOMMENDATIONS 12/13/2019 Follow up Recommendations None   CHL IP FREQUENCY AND DURATION 12/13/2019 Speech Therapy Frequency (ACUTE ONLY) min 2x/week Treatment Duration 2 weeks      CHL IP ORAL PHASE 12/13/2019 Oral Phase Impaired Oral - Pudding Teaspoon -- Oral - Pudding Cup -- Oral - Honey Teaspoon -- Oral - Honey Cup -- Oral - Nectar Teaspoon -- Oral - Nectar Cup -- Oral - Nectar Straw -- Oral - Thin Teaspoon -- Oral - Thin Cup WFL Oral - Thin Straw WFL Oral - Puree -- Oral - Mech Soft -- Oral - Regular WFL Oral - Multi-Consistency -- Oral - Pill Other (Comment);Decreased bolus cohesion Oral Phase - Comment --  CHL IP PHARYNGEAL PHASE 12/13/2019 Pharyngeal Phase WFL Pharyngeal- Pudding Teaspoon -- Pharyngeal -- Pharyngeal- Pudding Cup -- Pharyngeal -- Pharyngeal- Honey Teaspoon -- Pharyngeal -- Pharyngeal- Honey Cup -- Pharyngeal -- Pharyngeal- Nectar Teaspoon -- Pharyngeal -- Pharyngeal- Nectar Cup -- Pharyngeal -- Pharyngeal- Nectar Straw --  Pharyngeal -- Pharyngeal- Thin Teaspoon -- Pharyngeal -- Pharyngeal- Thin Cup -- Pharyngeal -- Pharyngeal- Thin Straw -- Pharyngeal -- Pharyngeal- Puree -- Pharyngeal -- Pharyngeal- Mechanical Soft -- Pharyngeal -- Pharyngeal- Regular -- Pharyngeal -- Pharyngeal- Multi-consistency -- Pharyngeal -- Pharyngeal- Pill -- Pharyngeal -- Pharyngeal Comment --  CHL IP CERVICAL ESOPHAGEAL PHASE 12/13/2019 Cervical Esophageal Phase WFL Pudding Teaspoon -- Pudding Cup -- Honey Teaspoon -- Honey Cup -- Nectar Teaspoon -- Nectar Cup -- Nectar Straw -- Thin Teaspoon -- Thin Cup -- Thin Straw -- Puree -- Mechanical Soft -- Regular -- Multi-consistency -- Pill -- Cervical Esophageal Comment -- [place] Siren 12/13/2019, 2:13 PM Orbie Pyo Litaker M.Ed Actor Pager [phone] Office [phone]              ECHOCARDIOGRAM  COMPLETE  Result Date: 12/12/2019    ECHOCARDIOGRAM REPORT   Patient Name:   Courtney Bennett Date of Exam: 12/12/2019 Medical Rec #:  [id number]       Height:       64.0 in Accession #:    [id number]      Weight:       121.7 lb Date of Birth:  21-Sep-1965       BSA:          1.584 m Patient Age:    21 years        BP:           102/57 mmHg Patient Gender: F               HR:           70 bpm. Exam Location:  Inpatient Procedure: 2D Echo Indications:    Fungemia  History:        Patient has no prior history of Echocardiogram examinations.                 Sepsis, Signs/Symptoms:Altered Mental Status; Risk                 Factors:Diabetes.  Sonographer:    Johny Chess Referring Phys: [id number] [place]  1. Left ventricular ejection fraction, by estimation, is 50 to 55%. The left ventricle has low normal function. The left ventricle has no regional wall motion abnormalities. Left ventricular diastolic parameters are indeterminate.  2. Right ventricular systolic function is mildly reduced. The right ventricular size is mildly enlarged. There is normal pulmonary artery systolic pressure.  3. Left atrial size was mildly dilated.  4. The mitral valve is normal in structure. Trivial mitral valve regurgitation. No evidence of mitral stenosis.  5. The aortic valve is tricuspid. Aortic valve regurgitation is not visualized. No aortic stenosis is present.  6. The inferior vena cava is dilated in size with <50% respiratory variability, suggesting right atrial pressure of 15 mmHg. Comparison(s): No prior Echocardiogram. Conclusion(s)/Recommendation(s): No evidence of valvular vegetations on this transthoracic echocardiogram. Would recommend a transesophageal echocardiogram to exclude infective endocarditis if clinically indicated. FINDINGS  Left Ventricle: Left ventricular ejection fraction, by estimation, is 50 to 55%. The left ventricle has low normal function. The left ventricle has no regional wall motion  abnormalities. The left ventricular internal cavity size was normal in size. There is no left ventricular hypertrophy. Left ventricular diastolic parameters are indeterminate. Right Ventricle: The right ventricular size is mildly enlarged. No increase in right ventricular wall thickness. Right ventricular systolic function is mildly reduced. There is normal pulmonary artery systolic pressure. The tricuspid regurgitant velocity  is 2.09 m/s,  and with an assumed right atrial pressure of 15 mmHg, the estimated right ventricular systolic pressure is 08.6 mmHg. Left Atrium: Left atrial size was mildly dilated. Right Atrium: Right atrial size was normal in size. Pericardium: There is no evidence of pericardial effusion. Mitral Valve: The mitral valve is normal in structure. Trivial mitral valve regurgitation. No evidence of mitral valve stenosis. Tricuspid Valve: The tricuspid valve is normal in structure. Tricuspid valve regurgitation is mild . No evidence of tricuspid stenosis. Aortic Valve: The aortic valve is tricuspid. Aortic valve regurgitation is not visualized. No aortic stenosis is present. Pulmonic Valve: The pulmonic valve was not well visualized. Pulmonic valve regurgitation is not visualized. No evidence of pulmonic stenosis. Aorta: The aortic root, ascending aorta, aortic arch and descending aorta are all structurally normal, with no evidence of dilitation or obstruction. Venous: The inferior vena cava is dilated in size with less than 50% respiratory variability, suggesting right atrial pressure of 15 mmHg. IAS/Shunts: No atrial level shunt detected by color flow Doppler.  LEFT VENTRICLE PLAX 2D LVIDd:         4.40 cm  Diastology LVIDs:         3.10 cm  LV e' lateral:   7.29 cm/s LV PW:         0.80 cm  LV E/e' lateral: 10.2 LV IVS:        0.80 cm  LV e' medial:    7.40 cm/s LVOT diam:     1.80 cm  LV E/e' medial:  10.1 LV SV:         46 LV SV Index:   29 LVOT Area:     2.54 cm  RIGHT VENTRICLE             IVC RV S prime:     9.25 cm/s  IVC diam: 2.10 cm TAPSE (M-mode): 1.5 cm LEFT ATRIUM             Index       RIGHT ATRIUM          Index LA diam:        3.20 cm 2.02 cm/m  RA Area:     9.58 cm LA Vol (A2C):   63.1 ml 39.84 ml/m RA Volume:   19.30 ml 12.18 ml/m LA Vol (A4C):   36.4 ml 22.98 ml/m LA Biplane Vol: 48.8 ml 30.81 ml/m  AORTIC VALVE LVOT Vmax:   89.70 cm/s LVOT Vmean:  62.000 cm/s LVOT VTI:    0.179 m  AORTA Ao Root diam: 3.00 cm Ao Asc diam:  3.00 cm MITRAL VALVE               TRICUSPID VALVE MV Area (PHT): 2.95 cm    TR Peak grad:   17.5 mmHg MV Decel Time: 257 msec    TR Vmax:        209.00 cm/s MV E velocity: 74.40 cm/s MV A velocity: 72.40 cm/s  SHUNTS MV E/A ratio:  1.03        Systemic VTI:  0.18 m                            Systemic Diam: 1.80 cm Buford Dresser MD Electronically signed by Buford Dresser MD Signature Date/Time: 12/12/2019/10:59:16 AM    Final     (Echo, Carotid, EGD, Colonoscopy, ERCP)    Subjective:  She is resting in bed in no acute distress anxious to go home  she has her 66 year old son at home. Discharge Exam: Vitals:   12/15/19 0500 12/15/19 0737  BP:  137/66  Pulse: 80   Resp: 16 16  Temp:  97.8 F (36.6 C)  SpO2: 91% 97%   Vitals:   12/15/19 0400 12/15/19 0438 12/15/19 0500 12/15/19 0737  BP: 133/72   137/66  Pulse: 81  80   Resp: 14  16 16   Temp:    97.8 F (36.6 C)  TempSrc:    Oral  SpO2: 94%  91% 97%  Weight:  57 kg    Height:        General: Pt is alert, awake, not in acute distress Cardiovascular: RRR, S1/S2 +, no rubs, no gallops Respiratory: CTA bilaterally, no wheezing, no rhonchi Abdominal: Soft, NT, ND, bowel sounds + Extremities: no edema, no cyanosis    The results of significant diagnostics from this hospitalization (including imaging, microbiology, ancillary and laboratory) are listed below for reference.     Microbiology: Recent Results (from the past 240 hour(s))  Urine culture     Status: None    Collection Time: 12/09/19  9:10 PM   Specimen: Urine, Random  Result Value Ref Range Status   Specimen Description URINE, RANDOM  Final   Special Requests NONE  Final   Culture   Final    NO GROWTH Performed at [facility], [address] [place], [place] [address]    Report Status 12/11/2019 FINAL  Final  Blood Culture (routine x 2)     Status: None   Collection Time: 12/09/19  9:15 PM   Specimen: BLOOD  Result Value Ref Range Status   Specimen Description BLOOD RIGHT ANTECUBITAL  Final   Special Requests   Final    BOTTLES DRAWN AEROBIC AND ANAEROBIC Blood Culture adequate volume   Culture   Final    NO GROWTH 5 DAYS Performed at [facility], [place] [address] [place], [place] [address]    Report Status 12/14/2019 FINAL  Final  Blood Culture (routine x 2)     Status: Abnormal (Preliminary result)   Collection Time: 12/09/19  9:18 PM   Specimen: BLOOD RIGHT HAND  Result Value Ref Range Status   Specimen Description BLOOD RIGHT HAND  Final   Special Requests   Final    BOTTLES DRAWN AEROBIC ONLY Blood Culture results may not be optimal due to an inadequate volume of blood received in culture bottles   Culture  Setup Time   Final    AEROBIC BOTTLE ONLY YEAST CRITICAL RESULT CALLED TO, READ BACK BY AND VERIFIED WITH: V BRYK PHARMD 12/11/19 2345 JDW    Culture (A)  Final    CANDIDA ALBICANS Sent to [place] for further susceptibility testing. Performed at [facility], [place] [address] [place], [place] [address]    Report Status PENDING  Incomplete  Blood Culture ID Panel (Reflexed)     Status: Abnormal   Collection Time: 12/09/19  9:18 PM  Result Value Ref Range Status   Enterococcus species NOT DETECTED NOT DETECTED Final   Listeria monocytogenes NOT DETECTED NOT DETECTED Final   Staphylococcus species NOT DETECTED NOT DETECTED Final   Staphylococcus aureus (BCID) NOT DETECTED NOT DETECTED Final   Streptococcus species NOT DETECTED NOT DETECTED Final    Streptococcus agalactiae NOT DETECTED NOT DETECTED Final   Streptococcus pneumoniae NOT DETECTED NOT DETECTED Final   Streptococcus pyogenes NOT DETECTED NOT DETECTED Final   Acinetobacter baumannii NOT DETECTED  NOT DETECTED Final   Enterobacteriaceae species NOT DETECTED NOT DETECTED Final   Enterobacter cloacae complex NOT DETECTED NOT DETECTED Final   Escherichia coli NOT DETECTED NOT DETECTED Final   Klebsiella oxytoca NOT DETECTED NOT DETECTED Final   Klebsiella pneumoniae NOT DETECTED NOT DETECTED Final   Proteus species NOT DETECTED NOT DETECTED Final   Serratia marcescens NOT DETECTED NOT DETECTED Final   Haemophilus influenzae NOT DETECTED NOT DETECTED Final   Neisseria meningitidis NOT DETECTED NOT DETECTED Final   Pseudomonas aeruginosa NOT DETECTED NOT DETECTED Final   Candida albicans DETECTED (A) NOT DETECTED Final    Comment: CRITICAL RESULT CALLED TO, READ BACK BY AND VERIFIED WITH: V BRYK PHARMD 12/11/19 2345 JDW    Candida glabrata NOT DETECTED NOT DETECTED Final   Candida krusei NOT DETECTED NOT DETECTED Final   Candida parapsilosis NOT DETECTED NOT DETECTED Final   Candida tropicalis NOT DETECTED NOT DETECTED Final    Comment: Performed at [facility], [place] [address], [place], [place] [address]  Antifungal Suscep, Fluconazole     Status: None (Preliminary result)   Collection Time: 12/09/19  9:18 PM  Result Value Ref Range Status   Organism ID, Yeast Preliminary report  Final    Comment: (NOTE) Specimen has been received and testing has been initiated. Performed At: [facility] [place], [place] [id number] Rush Farmer MD [id number]    Fluconazole Islt MIC PENDING  Incomplete   Source of Sample CANDIDA ALBICANS/ BLOOD  Final    Comment: Performed at [facility], [place] [address], [place], [place] [address]  SARS Coronavirus 2 by RT PCR (hospital order, performed in [facility] hospital lab) Nasopharyngeal Nasopharyngeal  Swab     Status: None   Collection Time: 12/10/19 12:16 AM   Specimen: Nasopharyngeal Swab  Result Value Ref Range Status   SARS Coronavirus 2 NEGATIVE NEGATIVE Final    Comment: (NOTE) SARS-CoV-2 target nucleic acids are NOT DETECTED.  The SARS-CoV-2 RNA is generally detectable in upper and lower respiratory specimens during the acute phase of infection. The lowest concentration of SARS-CoV-2 viral copies this assay can detect is 250 copies / mL. A negative result does not preclude SARS-CoV-2 infection and should not be used as the sole basis for treatment or other patient management decisions.  A negative result may occur with improper specimen collection / handling, submission of specimen other than nasopharyngeal swab, presence of viral mutation(s) within the areas targeted by this assay, and inadequate number of viral copies (<250 copies / mL). A negative result must be combined with clinical observations, patient history, and epidemiological information.  Fact Sheet for Patients:   StrictlyIdeas.no  Fact Sheet for Healthcare Providers: BankingDealers.co.za  This test is not yet approved or  cleared by the [place] FDA and has been authorized for detection and/or diagnosis of SARS-CoV-2 by FDA under an Emergency Use Authorization (EUA).  This EUA will remain in effect (meaning this test can be used) for the duration of the COVID-19 declaration under Section 564(b)(1) of the Act, 21 U.S.C. section 360bbb-3(b)(1), unless the authorization is terminated or revoked sooner.  Performed at [facility], [place] [address] [place], [place] [address]   MRSA PCR Screening     Status: Abnormal   Collection Time: 12/10/19  2:39 AM   Specimen: Nasal Mucosa; Nasopharyngeal  Result Value Ref Range Status   MRSA by PCR POSITIVE (A) NEGATIVE Final    Comment:  The GeneXpert MRSA Assay (FDA approved for NASAL specimens only),  is one component of a comprehensive MRSA colonization surveillance program. It is not intended to diagnose MRSA infection nor to guide or monitor treatment for MRSA infections. RESULT CALLED TO, READ BACK BY AND VERIFIED WITH: [facility] RN [id number] 12/10/2019 MITCHELL,L Performed at [facility], [place] [address] [place], [place] [address]   Culture, blood (routine x 2)     Status: None (Preliminary result)   Collection Time: 12/12/19 11:13 AM   Specimen: BLOOD  Result Value Ref Range Status   Specimen Description BLOOD RIGHT ANTECUBITAL  Final   Special Requests   Final    BOTTLES DRAWN AEROBIC AND ANAEROBIC Blood Culture adequate volume   Culture   Final    NO GROWTH 2 DAYS Performed at [facility], [place] [address] [place], [place] [address]    Report Status PENDING  Incomplete  Culture, blood (routine x 2)     Status: None (Preliminary result)   Collection Time: 12/12/19 11:21 AM   Specimen: BLOOD RIGHT HAND  Result Value Ref Range Status   Specimen Description BLOOD RIGHT HAND  Final   Special Requests   Final    BOTTLES DRAWN AEROBIC ONLY Blood Culture adequate volume   Culture   Final    NO GROWTH 2 DAYS Performed at [facility], [place] [address] [place], [place] [address]    Report Status PENDING  Incomplete     Labs: BNP (last 3 results) No results for input(s): BNP in the last 8760 hours. Basic Metabolic Panel: Recent Labs  Lab 12/10/19 0251 12/10/19 0406 12/10/19 2014 12/11/19 0420 12/11/19 7116 12/11/19 1959 12/12/19 0059 12/12/19 0427 12/12/19 1150 12/12/19 1150 12/13/19 0130 12/13/19 0412 12/13/19 1513 12/14/19 0649 12/14/19 1428 12/15/19 0135  NA 142   < >   < >  --  142  --   --   --  136  --   --  140  --  141  --  139  K 4.1   < >   < >  --  3.6  --   --   --  4.3  --   --  3.4*  --  3.5  --  3.6  CL 103   < >   < >  --  112*  --   --   --  106  --   --  109  --  105  --  104  CO2 17*   < >   < >  --  25  --   --   --  24  --    --  25  --  29  --  27  GLUCOSE 572*   < >   < >  --  170*  --   --   --  328*  --   --  132*  --  83  --  162*  BUN 45*   < >   < >  --  20  --   --   --  13  --   --  12  --  8  --  8  CREATININE 1.96*   < >   < >  --  0.57  --   --   --  0.54  --   --  0.44  --  0.40*  --  0.42*  CALCIUM 10.1   < >   < >  --  8.7*  --   --   --  8.6*  --   --  8.5*  --  8.6*  --  8.6*  MG 1.9   < >  --  1.4*  --   --    < >  --  2.1   < > 1.7  --  2.1 1.9 1.9 1.8  PHOS 1.5*  --   --  1.3*  --  4.0  --  2.5  --   --   --   --   --   --   --   --    < > = values in this interval not displayed.   Liver Function Tests: Recent Labs  Lab 12/09/19 2120 12/14/19 0649 12/15/19 0135  AST 17 12* 13*  ALT 17 16 15   ALKPHOS 74 75 60  BILITOT 2.7* 0.3 0.6  PROT 6.0* 4.6* 4.5*  ALBUMIN 3.4* 1.8* 1.8*   Recent Labs  Lab 12/10/19 0251  LIPASE 903*  AMYLASE 774*   Recent Labs  Lab 12/09/19 2120  AMMONIA 28   CBC: Recent Labs  Lab 12/09/19 2120 12/09/19 2127 12/10/19 0023 12/10/19 0210 12/10/19 0251 12/10/19 0406 12/10/19 1518 12/10/19 2014 12/12/19 0427 12/12/19 1511 12/12/19 2000 12/13/19 0412 12/14/19 0649  WBC 34.3*  --  31.0*  --  29.8*  --  11.2*  --   --   --   --   --  12.4*  NEUTROABS 29.4*  --  27.8*  --   --   --   --   --   --   --   --   --   --   HGB 16.7*   < > 16.0*   < > 16.4*   < > 12.3   < > 10.6* 10.8* 11.0* 11.1* 12.4  HCT 52.8*   < > 47.3*   < > 48.0*   < > 36.0   < > 32.2* 32.9* 33.7* 34.6* 38.0  MCV 100.4*  --  95.4  --  91.6  --  91.4  --   --   --   --   --  98.4  PLT 323  --  256  --  271  --  131*  --   --   --   --   --  168   < > = values in this interval not displayed.   Cardiac Enzymes: No results for input(s): CKTOTAL, CKMB, CKMBINDEX, TROPONINI in the last 168 hours. BNP: Invalid input(s): POCBNP CBG: Recent Labs  Lab 12/14/19 1549 12/14/19 1928 12/14/19 2319 12/15/19 0322 12/15/19 0732  GLUCAP 158* 156* 147* 143* 159*   D-Dimer No results  for input(s): DDIMER in the last 72 hours. Hgb A1c Recent Labs    12/12/19 0956  HGBA1C 15.2*   Lipid Profile Recent Labs    12/13/19 1513  TRIG 148   Thyroid function studies No results for input(s): TSH, T4TOTAL, T3FREE, THYROIDAB in the last 72 hours.  Invalid input(s): FREET3 Anemia work up No results for input(s): VITAMINB12, FOLATE, FERRITIN, TIBC, IRON, RETICCTPCT in the last 72 hours. Urinalysis    Component Value Date/Time   COLORURINE STRAW (A) 12/09/2019 2106   APPEARANCEUR CLEAR 12/09/2019 2106   LABSPEC 1.015 12/09/2019 2106   PHURINE 5.0 12/09/2019 2106   GLUCOSEU >=500 (A) 12/09/2019 2106   HGBUR MODERATE (A) 12/09/2019 2106   BILIRUBINUR NEGATIVE 12/09/2019 2106   KETONESUR 80 (A) 12/09/2019 2106   PROTEINUR 30 (  A) 12/09/2019 2106   UROBILINOGEN 0.2 03/02/2012 0159   NITRITE NEGATIVE 12/09/2019 2106   LEUKOCYTESUR NEGATIVE 12/09/2019 2106   Sepsis Labs Invalid input(s): PROCALCITONIN,  WBC,  LACTICIDVEN Microbiology Recent Results (from the past 240 hour(s))  Urine culture     Status: None   Collection Time: 12/09/19  9:10 PM   Specimen: Urine, Random  Result Value Ref Range Status   Specimen Description URINE, RANDOM  Final   Special Requests NONE  Final   Culture   Final    NO GROWTH Performed at [facility], [address], [place], [place] [address]    Report Status 12/11/2019 FINAL  Final  Blood Culture (routine x 2)     Status: None   Collection Time: 12/09/19  9:15 PM   Specimen: BLOOD  Result Value Ref Range Status   Specimen Description BLOOD RIGHT ANTECUBITAL  Final   Special Requests   Final    BOTTLES DRAWN AEROBIC AND ANAEROBIC Blood Culture adequate volume   Culture   Final    NO GROWTH 5 DAYS Performed at [facility], [place] [address], [place], [place] [address]    Report Status 12/14/2019 FINAL  Final  Blood Culture (routine x 2)     Status: Abnormal (Preliminary result)   Collection Time: 12/09/19  9:18 PM    Specimen: BLOOD RIGHT HAND  Result Value Ref Range Status   Specimen Description BLOOD RIGHT HAND  Final   Special Requests   Final    BOTTLES DRAWN AEROBIC ONLY Blood Culture results may not be optimal due to an inadequate volume of blood received in culture bottles   Culture  Setup Time   Final    AEROBIC BOTTLE ONLY YEAST CRITICAL RESULT CALLED TO, READ BACK BY AND VERIFIED WITH: V BRYK PHARMD 12/11/19 2345 JDW    Culture (A)  Final    CANDIDA ALBICANS Sent to [place] for further susceptibility testing. Performed at [facility], [place] [address], [place], [place] [address]    Report Status PENDING  Incomplete  Blood Culture ID Panel (Reflexed)     Status: Abnormal   Collection Time: 12/09/19  9:18 PM  Result Value Ref Range Status   Enterococcus species NOT DETECTED NOT DETECTED Final   Listeria monocytogenes NOT DETECTED NOT DETECTED Final   Staphylococcus species NOT DETECTED NOT DETECTED Final   Staphylococcus aureus (BCID) NOT DETECTED NOT DETECTED Final   Streptococcus species NOT DETECTED NOT DETECTED Final   Streptococcus agalactiae NOT DETECTED NOT DETECTED Final   Streptococcus pneumoniae NOT DETECTED NOT DETECTED Final   Streptococcus pyogenes NOT DETECTED NOT DETECTED Final   Acinetobacter baumannii NOT DETECTED NOT DETECTED Final   Enterobacteriaceae species NOT DETECTED NOT DETECTED Final   Enterobacter cloacae complex NOT DETECTED NOT DETECTED Final   Escherichia coli NOT DETECTED NOT DETECTED Final   Klebsiella oxytoca NOT DETECTED NOT DETECTED Final   Klebsiella pneumoniae NOT DETECTED NOT DETECTED Final   Proteus species NOT DETECTED NOT DETECTED Final   Serratia marcescens NOT DETECTED NOT DETECTED Final   Haemophilus influenzae NOT DETECTED NOT DETECTED Final   Neisseria meningitidis NOT DETECTED NOT DETECTED Final   Pseudomonas aeruginosa NOT DETECTED NOT DETECTED Final   Candida albicans DETECTED (A) NOT DETECTED Final    Comment: CRITICAL RESULT  CALLED TO, READ BACK BY AND VERIFIED WITH: V BRYK PHARMD 12/11/19 2345 JDW    Candida glabrata NOT DETECTED NOT DETECTED Final   Candida krusei  NOT DETECTED NOT DETECTED Final   Candida parapsilosis NOT DETECTED NOT DETECTED Final   Candida tropicalis NOT DETECTED NOT DETECTED Final    Comment: Performed at [facility], [place] [address] [place], [place] [address]  Antifungal Suscep, Fluconazole     Status: None (Preliminary result)   Collection Time: 12/09/19  9:18 PM  Result Value Ref Range Status   Organism ID, Yeast Preliminary report  Final    Comment: (NOTE) Specimen has been received and testing has been initiated. Performed At: [facility] [place], [place] [id number] Rush Farmer MD YY:[id number]    Fluconazole Islt MIC PENDING  Incomplete   Source of Sample CANDIDA ALBICANS/ BLOOD  Final    Comment: Performed at [facility], [place] [address] [place], [place] [address]  SARS Coronavirus 2 by RT PCR (hospital order, performed in [facility] hospital lab) Nasopharyngeal Nasopharyngeal Swab     Status: None   Collection Time: 12/10/19 12:16 AM   Specimen: Nasopharyngeal Swab  Result Value Ref Range Status   SARS Coronavirus 2 NEGATIVE NEGATIVE Final    Comment: (NOTE) SARS-CoV-2 target nucleic acids are NOT DETECTED.  The SARS-CoV-2 RNA is generally detectable in upper and lower respiratory specimens during the acute phase of infection. The lowest concentration of SARS-CoV-2 viral copies this assay can detect is 250 copies / mL. A negative result does not preclude SARS-CoV-2 infection and should not be used as the sole basis for treatment or other patient management decisions.  A negative result may occur with improper specimen collection / handling, submission of specimen other than nasopharyngeal swab, presence of viral mutation(s) within the areas targeted by this assay, and inadequate number of viral copies (<250 copies / mL). A  negative result must be combined with clinical observations, patient history, and epidemiological information.  Fact Sheet for Patients:   StrictlyIdeas.no  Fact Sheet for Healthcare Providers: BankingDealers.co.za  This test is not yet approved or  cleared by the [place] FDA and has been authorized for detection and/or diagnosis of SARS-CoV-2 by FDA under an Emergency Use Authorization (EUA).  This EUA will remain in effect (meaning this test can be used) for the duration of the COVID-19 declaration under Section 564(b)(1) of the Act, 21 U.S.C. section 360bbb-3(b)(1), unless the authorization is terminated or revoked sooner.  Performed at [facility], [place] [address], [place], [place] [address]   MRSA PCR Screening     Status: Abnormal   Collection Time: 12/10/19  2:39 AM   Specimen: Nasal Mucosa; Nasopharyngeal  Result Value Ref Range Status   MRSA by PCR POSITIVE (A) NEGATIVE Final    Comment:        The GeneXpert MRSA Assay (FDA approved for NASAL specimens only), is one component of a comprehensive MRSA colonization surveillance program. It is not intended to diagnose MRSA infection nor to guide or monitor treatment for MRSA infections. RESULT CALLED TO, READ BACK BY AND VERIFIED WITH: [facility] RN 9449 12/10/2019 MITCHELL,L Performed at [facility], [place] [address], [place], [place] [address]   Culture, blood (routine x 2)     Status: None (Preliminary result)   Collection Time: 12/12/19 11:13 AM   Specimen: BLOOD  Result Value Ref Range Status   Specimen Description BLOOD RIGHT ANTECUBITAL  Final   Special Requests   Final    BOTTLES DRAWN AEROBIC AND ANAEROBIC Blood Culture adequate volume   Culture   Final  NO GROWTH 2 DAYS Performed at [facility], [place] [address], [place], [place] [address]    Report Status PENDING  Incomplete  Culture, blood (routine x 2)     Status: None (Preliminary  result)   Collection Time: 12/12/19 11:21 AM   Specimen: BLOOD RIGHT HAND  Result Value Ref Range Status   Specimen Description BLOOD RIGHT HAND  Final   Special Requests   Final    BOTTLES DRAWN AEROBIC ONLY Blood Culture adequate volume   Culture   Final    NO GROWTH 2 DAYS Performed at [facility], [place] [address], [place], [place] [address]    Report Status PENDING  Incomplete     Time coordinating discharge:  38 minutes  SIGNED:   Georgette Shell, MD  [facility] 12/15/2019, 9:48 AM Pager   If 7PM-7AM, please contact night-coverage [email or web address] Password [email or web address]

## 2019-12-15 NOTE — Progress Notes (Signed)
New order to discharge patient home.  PIVs and cardiac monitoring discontinued.  AVS reviewed with patient.  All questions answered.  SW will set up home health who will contact patient tomorrow.  Family to transport patient home.

## 2019-12-17 LAB — CULTURE, BLOOD (ROUTINE X 2)
Culture: NO GROWTH
Culture: NO GROWTH
Special Requests: ADEQUATE
Special Requests: ADEQUATE

## 2019-12-17 LAB — ANTIFUNGAL SUSCEP, FLUCONAZOLE

## 2019-12-17 NOTE — Progress Notes (Signed)
Received a call from patient's daughter asking for liquid antibiotic to be prescribed due to sore throat and difficulty swallowing. Instructed her that Metronidazole is easily dissolved and could be given in puree or juice. She then said that she had not heard from [facility] and wanted to know if someone could give her information about when the pt. Would receive assistance. The patient was discharged without [facility] due to not having insurance, per LCSW note. She was originally recommended for a SNF, but refused. When the daughter called the floor today asking for help, the patient said that she has [facility] insurance. A message was sent to discharging LCSW via secure chat and the patient and family were told not to expect to hear anything until Monday.

## 2019-12-18 NOTE — Social Work (Signed)
This Probation officer, CSW was contacted by [name] with Care Management Assistance and informed patient was discharged on 7/16 without home health services and requested follow-up.   CSW contacted patient and spoke with patient's daughter Courtney Bennett due to patient medically being unable to speak. CSW informed patient and Courtney Bennett that [facility] contacted all agencies on 7/16 and was unable to arrange [facility] services with an agency due to patient's insurance. When CSW Jeannetta Nap tried reaching out to patient, she had already discharged from the hospital and did not answer the phone call that CSW made to her cell phone.  CSW Erin Hearing advised family to reach out to the PCP to assist with having services put into place.    Courtney Bennett became upset and began yelling at [name], stating she has had to care for patient and she needs PT/OT and someone to help with baths. Courtney Bennett asked why they have to contact the PCP instead of the services being put in place prior to discharge. CSW attempted to again explain that there was no agency able to provide home health services prior to discharge, and CSW Jeannetta Nap attempted to contact patient. Courtney Bennett continued to yell at [name] and disconnected the call.  Criss Alvine, [place] Social Worker

## 2019-12-27 DIAGNOSIS — F17200 Nicotine dependence, unspecified, uncomplicated: Secondary | ICD-10-CM | POA: Insufficient documentation

## 2019-12-27 MED FILL — Vasopressin IV Soln 20 Unit/ML (For IV Infusion): INTRAVENOUS | Qty: 1 | Status: AC

## 2019-12-27 MED FILL — Sodium Chloride IV Soln 0.9%: INTRAVENOUS | Qty: 100 | Status: AC

## 2020-03-29 ENCOUNTER — Other Ambulatory Visit: Payer: Self-pay | Admitting: Family Medicine

## 2020-03-29 DIAGNOSIS — Z1231 Encounter for screening mammogram for malignant neoplasm of breast: Secondary | ICD-10-CM

## 2020-06-03 ENCOUNTER — Encounter (HOSPITAL_COMMUNITY): Payer: Self-pay

## 2020-06-03 ENCOUNTER — Other Ambulatory Visit: Payer: Self-pay

## 2020-06-03 ENCOUNTER — Emergency Department (HOSPITAL_COMMUNITY)
Admission: EM | Admit: 2020-06-03 | Discharge: 2020-06-03 | Disposition: A | Discharge status: Home / Self Care | Payer: Medicare Other | Attending: Emergency Medicine | Admitting: Emergency Medicine

## 2020-06-03 DIAGNOSIS — Z5321 Procedure and treatment not carried out due to patient leaving prior to being seen by health care provider: Secondary | ICD-10-CM | POA: Insufficient documentation

## 2020-06-03 DIAGNOSIS — R440 Auditory hallucinations: Secondary | ICD-10-CM | POA: Diagnosis not present

## 2020-06-03 NOTE — ED Triage Notes (Signed)
Pt BIB EMS from home. Pt is having auditory hallucinations. Pt has hx of bipolar disorder and schizophrenia. Pt is not taking her medications.  CBG 282 BP 200/130 HR 104 99% RA

## 2020-06-03 NOTE — ED Notes (Signed)
Pt refusing to be seen or cooperate with staff.

## 2020-06-03 NOTE — ED Notes (Signed)
Pt refusing to wear a mask, pacing in lobby and intentionally coughing in the faces of other patients waiting in lobby.

## 2020-06-04 ENCOUNTER — Ambulatory Visit: Payer: Medicare Other

## 2020-06-12 ENCOUNTER — Other Ambulatory Visit: Payer: Self-pay | Admitting: Family Medicine

## 2020-06-13 NOTE — Telephone Encounter (Signed)
Please advise. Last fill 06/08/19  #90/3

## 2020-07-02 ENCOUNTER — Ambulatory Visit: Payer: Medicare Other

## 2020-08-05 DIAGNOSIS — R7989 Other specified abnormal findings of blood chemistry: Secondary | ICD-10-CM | POA: Diagnosis not present

## 2020-08-05 DIAGNOSIS — E785 Hyperlipidemia, unspecified: Secondary | ICD-10-CM | POA: Diagnosis not present

## 2020-08-05 DIAGNOSIS — E139 Other specified diabetes mellitus without complications: Secondary | ICD-10-CM | POA: Diagnosis not present

## 2020-08-05 DIAGNOSIS — I700 Atherosclerosis of aorta: Secondary | ICD-10-CM | POA: Diagnosis not present

## 2020-08-05 DIAGNOSIS — Z681 Body mass index (BMI) 19 or less, adult: Secondary | ICD-10-CM | POA: Diagnosis not present

## 2020-08-05 DIAGNOSIS — F17210 Nicotine dependence, cigarettes, uncomplicated: Secondary | ICD-10-CM | POA: Diagnosis not present

## 2020-08-15 ENCOUNTER — Ambulatory Visit
Admission: RE | Admit: 2020-08-15 | Discharge: 2020-08-15 | Disposition: A | Discharge status: Home / Self Care | Payer: Medicare Other | Source: Ambulatory Visit | Attending: Family Medicine | Admitting: Family Medicine

## 2020-08-15 ENCOUNTER — Other Ambulatory Visit: Payer: Self-pay

## 2020-08-15 DIAGNOSIS — Z1231 Encounter for screening mammogram for malignant neoplasm of breast: Secondary | ICD-10-CM | POA: Diagnosis not present

## 2020-08-19 ENCOUNTER — Other Ambulatory Visit: Payer: Self-pay | Admitting: Family Medicine

## 2020-08-19 DIAGNOSIS — R928 Other abnormal and inconclusive findings on diagnostic imaging of breast: Secondary | ICD-10-CM

## 2020-08-30 ENCOUNTER — Other Ambulatory Visit: Payer: Self-pay

## 2020-08-30 DIAGNOSIS — Z5321 Procedure and treatment not carried out due to patient leaving prior to being seen by health care provider: Secondary | ICD-10-CM | POA: Insufficient documentation

## 2020-08-30 DIAGNOSIS — E119 Type 2 diabetes mellitus without complications: Secondary | ICD-10-CM | POA: Insufficient documentation

## 2020-08-30 DIAGNOSIS — W57XXXA Bitten or stung by nonvenomous insect and other nonvenomous arthropods, initial encounter: Secondary | ICD-10-CM | POA: Insufficient documentation

## 2020-08-30 DIAGNOSIS — R509 Fever, unspecified: Secondary | ICD-10-CM | POA: Insufficient documentation

## 2020-08-30 DIAGNOSIS — I10 Essential (primary) hypertension: Secondary | ICD-10-CM | POA: Insufficient documentation

## 2020-08-30 DIAGNOSIS — S80861A Insect bite (nonvenomous), right lower leg, initial encounter: Secondary | ICD-10-CM | POA: Insufficient documentation

## 2020-08-31 ENCOUNTER — Encounter (HOSPITAL_COMMUNITY): Payer: Self-pay

## 2020-08-31 ENCOUNTER — Emergency Department (HOSPITAL_COMMUNITY)
Admission: EM | Admit: 2020-08-31 | Discharge: 2020-08-31 | Disposition: A | Discharge status: Home / Self Care | Payer: Medicare Other | Attending: Emergency Medicine | Admitting: Emergency Medicine

## 2020-08-31 DIAGNOSIS — S80861A Insect bite (nonvenomous), right lower leg, initial encounter: Secondary | ICD-10-CM | POA: Diagnosis not present

## 2020-08-31 LAB — BASIC METABOLIC PANEL
Anion gap: 9 (ref 5–15)
BUN: 20 mg/dL (ref 6–20)
CO2: 25 mmol/L (ref 22–32)
Calcium: 8.8 mg/dL — ABNORMAL LOW (ref 8.9–10.3)
Chloride: 98 mmol/L (ref 98–111)
Creatinine, Ser: 0.69 mg/dL (ref 0.44–1.00)
GFR, Estimated: >60 mL/min (ref >60)
Glucose, Bld: 569 mg/dL (ref 70–99)
Potassium: 4.7 mmol/L (ref 3.5–5.1)
Sodium: 132 mmol/L — ABNORMAL LOW (ref 135–145)

## 2020-08-31 LAB — CBC WITH DIFFERENTIAL/PLATELET
Abs Immature Granulocytes: 0.04 10*3/uL (ref 0.00–0.07)
Basophils Absolute: 0.1 10*3/uL (ref 0.0–0.1)
Basophils Relative: 1 %
Eosinophils Absolute: 0.2 10*3/uL (ref 0.0–0.5)
Eosinophils Relative: 1 %
HCT: 48.7 % — ABNORMAL HIGH (ref 36.0–46.0)
Hemoglobin: 16 g/dL — ABNORMAL HIGH (ref 12.0–15.0)
Immature Granulocytes: 0 %
Lymphocytes Relative: 16 %
Lymphs Abs: 2 10*3/uL (ref 0.7–4.0)
MCH: 31 pg (ref 26.0–34.0)
MCHC: 32.9 g/dL (ref 30.0–36.0)
MCV: 94.4 fL (ref 80.0–100.0)
Monocytes Absolute: 1 10*3/uL (ref 0.1–1.0)
Monocytes Relative: 8 %
Neutro Abs: 9.3 10*3/uL — ABNORMAL HIGH (ref 1.7–7.7)
Neutrophils Relative %: 74 %
Platelets: 220 10*3/uL (ref 150–400)
RBC: 5.16 MIL/uL — ABNORMAL HIGH (ref 3.87–5.11)
RDW: 12.5 % (ref 11.5–15.5)
WBC: 12.5 10*3/uL — ABNORMAL HIGH (ref 4.0–10.5)
nRBC: 0 % (ref 0.0–0.2)

## 2020-08-31 LAB — CBG MONITORING, ED: Glucose-Capillary: 590 mg/dL (ref 70–99)

## 2020-08-31 MED ORDER — OXYCODONE-ACETAMINOPHEN 5-325 MG PO TABS
1.0000 | ORAL_TABLET | Freq: Once | ORAL | Status: DC
  Filled 2020-08-31: qty 1

## 2020-08-31 NOTE — ED Notes (Signed)
Called pt for vitals and no answer

## 2020-08-31 NOTE — ED Provider Notes (Signed)
MSE was initiated and I personally evaluated the patient and placed orders (if any) at  12:26 AM on August 31, 2020.  The patient appears stable so that the remainder of the MSE may be completed by another provider.  55 year old female who presents to the emergency department with a chief complaint of right lower leg wound.  She is concerned that she may have been bitten by a spider yesterday.  She did not see a spider crawling on her.  However, she developed redness and pain to the right lower leg that has rapidly worsened since yesterday.  The wound began having purulent drainage earlier today.  No fever or chills.  No numbness or weakness.  She has been able to ambulate.  She is a diabetic and has not been taking her home medications.  She states that her son has been ill and she has been trying to help get him care and has been neglecting her home medications.  She has also not taken her home losartan for the last few days.  On exam, there is a circular, erythematous, area noted to the right lower leg.  Purulent drainage is noticed from the central ulceration.  No eschar.  No red streaking.  Neurovascularly intact to the bilateral lower extremities.  Patient is tearful.    We will add on point-of-care CBG, which is 509 in triage.  Labs, including CBC and metabolic panel have been initiated in triage.  No indication for imaging at this time as I have a lower suspicion for osteomyelitis given symptom onset yesterday.  Discussed with patient that there care has been initiated.   Patient is requesting to have a dressing placed on the wound so that she can go home to be with her dogs I have discussed at length that if the patient left it would be [place] and discussed at length the risks, up to and including bacteremia and death if patient were to leave before her work-up was completed.  Patient has been counseled on need to stay for full evaluation.  Risks of leaving the emergency  department prior to completion of treatment were discussed. Patient was advised to inform ED staff if they are leaving before treatment is complete. The patient acknowledged these risks and time was allowed for questions.       Joanne Gavel, PA-C 08/31/20 0026    Ezequiel Essex, MD 08/31/20 [phone]

## 2020-08-31 NOTE — ED Triage Notes (Addendum)
Pt reports pian in right leg pt states she has been bite by an insect yesterday  .Pt states she think she been bitten by an "spider". Pt states she been having fever and chills and been cleaning it with peroxide. Pt reports she is an diabetic and have not been checking her CBG she does not have an glucometer   Pt states have an hx of HTN and have not been taking her medication

## 2020-08-31 NOTE — ED Notes (Signed)
Critical CBG 569 notified charge

## 2020-09-05 ENCOUNTER — Other Ambulatory Visit: Payer: Self-pay | Admitting: Obstetrics and Gynecology

## 2020-09-05 DIAGNOSIS — R928 Other abnormal and inconclusive findings on diagnostic imaging of breast: Secondary | ICD-10-CM

## 2020-09-06 ENCOUNTER — Other Ambulatory Visit: Payer: Self-pay | Admitting: Obstetrics and Gynecology

## 2020-09-06 ENCOUNTER — Other Ambulatory Visit: Payer: Self-pay

## 2020-09-06 ENCOUNTER — Ambulatory Visit: Payer: Medicare Other

## 2020-09-06 ENCOUNTER — Ambulatory Visit
Admission: RE | Admit: 2020-09-06 | Discharge: 2020-09-06 | Disposition: A | Discharge status: Home / Self Care | Payer: Medicare Other | Source: Ambulatory Visit | Attending: Family Medicine | Admitting: Family Medicine

## 2020-09-06 DIAGNOSIS — R928 Other abnormal and inconclusive findings on diagnostic imaging of breast: Secondary | ICD-10-CM

## 2020-09-16 ENCOUNTER — Ambulatory Visit
Admission: RE | Admit: 2020-09-16 | Discharge: 2020-09-16 | Disposition: A | Discharge status: Home / Self Care | Payer: Medicare Other | Source: Ambulatory Visit | Attending: Obstetrics and Gynecology | Admitting: Obstetrics and Gynecology

## 2020-09-16 ENCOUNTER — Other Ambulatory Visit: Payer: Self-pay

## 2020-09-16 DIAGNOSIS — R928 Other abnormal and inconclusive findings on diagnostic imaging of breast: Secondary | ICD-10-CM

## 2020-09-17 ENCOUNTER — Other Ambulatory Visit: Payer: Self-pay | Admitting: Obstetrics and Gynecology

## 2020-09-17 DIAGNOSIS — R928 Other abnormal and inconclusive findings on diagnostic imaging of breast: Secondary | ICD-10-CM

## 2020-09-23 ENCOUNTER — Other Ambulatory Visit: Payer: Self-pay

## 2020-09-23 ENCOUNTER — Ambulatory Visit
Admission: RE | Admit: 2020-09-23 | Discharge: 2020-09-23 | Disposition: A | Discharge status: Home / Self Care | Payer: Medicare Other | Source: Ambulatory Visit | Attending: Obstetrics and Gynecology | Admitting: Obstetrics and Gynecology

## 2020-09-23 ENCOUNTER — Other Ambulatory Visit: Payer: Self-pay | Admitting: Obstetrics and Gynecology

## 2020-09-23 DIAGNOSIS — R928 Other abnormal and inconclusive findings on diagnostic imaging of breast: Secondary | ICD-10-CM

## 2020-10-08 ENCOUNTER — Encounter (HOSPITAL_BASED_OUTPATIENT_CLINIC_OR_DEPARTMENT_OTHER): Payer: Medicare Other | Admitting: Internal Medicine

## 2020-10-18 ENCOUNTER — Ambulatory Visit: Payer: Self-pay | Admitting: Surgery

## 2020-10-18 DIAGNOSIS — R920 Mammographic microcalcification found on diagnostic imaging of breast: Secondary | ICD-10-CM

## 2020-10-18 NOTE — H&P (Signed)
History of Present Illness Courtney Bennett. Courtney Ghazarian Courtney Bennett; 10/18/2020 12:09 PM) The patient is a 55 year old female who presents with a breast mass. PCP - Dr. Durene Fruits Referred for microcalcifications right breast  This is a 55 year old female with diabetes and hyperlipidemia as well as ongoing tobacco abuse who presents with a recent screening mammogram that revealed a 5 mm cluster of microcalcifications in the right breast. She underwent diagnostic mammogram which revealed a 5 mm group of calcifications in the far posterior upper outer right breast. She had 2 different attempts at stereotactic biopsy but due to the posterior location, her very thin body habitus, and her small breasts, they were unable to successfully biopsy this area. A biopsy clip was placed. She is referred to [place] to discuss excisional biopsy of this area for diagnosis.  The patient has a family history of breast cancer her mother diagnosed in her late 96s as well as her paternal grandmother. No previous breast surgeries.  The patient continues to smoke about a half a pack per day.   Problem List/Past Medical Courtney Key K. Jaymie Misch, Courtney Bennett; 10/18/2020 12:09 PM) MICROCALCIFICATION OF RIGHT BREAST ON MAMMOGRAM (R92.0)  Past Surgical History (Jocelyn Bond, CMA; 10/18/2020 10:34 AM) Cesarean Section - Multiple Hysterectomy (not due to cancer) - Partial Open Inguinal Hernia Surgery Bilateral. Oral Surgery Resection of Small Bowel  Diagnostic Studies History (Jocelyn Bond, CMA; 10/18/2020 10:34 AM) Colonoscopy never Mammogram within last year Pap Smear 1-5 years ago  Allergies (Jocelyn Bond, CMA; 10/18/2020 10:40 AM) LaMICtal *ANTICONVULSANTS* Percocet *ANALGESICS - OPIOID* Allergies Reconciled  Medication History (Jocelyn Bond, CMA; 10/18/2020 10:43 AM) Aspirin (81MG  Tablet Chewable, Oral) Active. Atorvastatin Calcium (10MG  Tablet, Oral) Active. Baqsimi Two Pack (3MG /DOSE Powder, Nasal) Active. HumaLOG (100UNIT/ML  Soln Cartridge, Subcutaneous) Active. Losartan Potassium (25MG  Tablet, Oral) Active. Fluconazole (50MG  Tablet, Oral) Active. Albuterol (90MCG/ACT Aerosol Soln, Inhalation) Active. Gabapentin (100MG  Capsule, Oral) Active. Budesonide-Formoterol Fumarate (80-4.5MCG/ACT Aerosol, Inhalation) Active. Medications Reconciled  Social History Verdie Drown Bond, CMA; 10/18/2020 10:34 AM) Caffeine use Coffee, Tea. No alcohol use No drug use Tobacco use Current every day smoker.  Family History Karl Bales, CMA; 10/18/2020 10:34 AM) Breast Cancer Mother. Cancer Father, Mother, Sister. Colon Cancer Family Members In General. Depression Daughter, Mother, Son. Heart Disease Family Members In General. Prostate Cancer Family Members In General.  Pregnancy / Birth History Karl Bales, CMA; 10/18/2020 10:34 AM) Age at menarche 89 years. Age of menopause 51-55 Contraceptive History Oral contraceptives. Gravida 3 Length (months) of breastfeeding 7-12 Maternal age 37-20 Para 3  Other Problems Courtney Bennett. Courtney Kary, Courtney Bennett; 10/18/2020 12:09 PM) Back Pain Diabetes Mellitus Gastroesophageal Reflux Disease Inguinal Hernia Oophorectomy Right. Other disease, cancer, significant illness Thyroid Disease Ulcerative Colitis     Review of Systems ([place]; 10/18/2020 10:34 AM) General Present- Appetite Loss, Fatigue, Night Sweats and Weight Loss. Not Present- Chills, Fever and Weight Gain. Skin Present- Hives, Non-Healing Wounds and Ulcer. Not Present- Change in Wart/Mole, Dryness, Jaundice, New Lesions and Rash. HEENT Present- Seasonal Allergies. Not Present- Earache, Hearing Loss, Hoarseness, Nose Bleed, Oral Ulcers, Ringing in the Ears, Sinus Pain, Sore Throat, Visual Disturbances, Wears glasses/contact lenses and Yellow Eyes. Respiratory Present- Snoring. Not Present- Bloody sputum, Chronic Cough, Difficulty Breathing and Wheezing. Breast Not Present- Breast Mass, Breast  Pain, Nipple Discharge and Skin Changes. Cardiovascular Not Present- Chest Pain, Difficulty Breathing Lying Down, Leg Cramps, Palpitations, Rapid Heart Rate, Shortness of Breath and Swelling of Extremities. Gastrointestinal Present- Abdominal Pain, Gets full quickly at meals, Indigestion and Nausea.  Not Present- Bloating, Bloody Stool, Change in Bowel Habits, Chronic diarrhea, Constipation, Difficulty Swallowing, Excessive gas, Hemorrhoids, Rectal Pain and Vomiting. Female Genitourinary Present- Frequency and Pelvic Pain. Not Present- Nocturia, Painful Urination and Urgency. Musculoskeletal Present- Back Pain, Joint Pain and Muscle Weakness. Not Present- Joint Stiffness, Muscle Pain and Swelling of Extremities. Neurological Present- Headaches and Weakness. Not Present- Decreased Memory, Fainting, Numbness, Seizures, Tingling, Tremor and Trouble walking. Psychiatric Not Present- Anxiety, Bipolar, Change in Sleep Pattern, Depression, Fearful and Frequent crying. Endocrine Present- Heat Intolerance and Hot flashes. Not Present- Cold Intolerance, Excessive Hunger, Hair Changes and New Diabetes. Hematology Present- Easy Bruising and Persistent Infections. Not Present- Blood Thinners, Excessive bleeding, Gland problems and HIV.  Vitals (Jocelyn Bond CMA; 10/18/2020 10:40 AM) 10/18/2020 10:39 AM Weight: 104.5 lb Height: 63in Body Surface Area: 1.47 m Body Mass Index: 18.51 kg/m  Temp.: 98.24F  Pulse: 95 (Regular)  P.OX: 85% (Room air) BP: 122/82(Sitting, Left Arm, Standard)        Physical Exam Courtney Key K. Candy Leverett Courtney Bennett; 10/18/2020 12:10 PM)  The physical exam findings are as follows: Note:Constitutional: WDWN in NAD, conversant, no obvious deformities; resting comfortably Eyes: Pupils equal, round; sclera anicteric; moist conjunctiva; no lid lag HENT: Oral mucosa moist; good dentition Neck: No masses palpated, trachea midline; no thyromegaly Lungs: CTA bilaterally; normal respiratory  effort Breasts: significant symmetric ptosis; no palpable masses or lymphadenopathy; no nipple discharge CV: Regular rate and rhythm; no murmurs; extremities well-perfused with no edema Abd: +bowel sounds, soft, non-tender, no palpable organomegaly; no palpable hernias Musc: Normal gait; no apparent clubbing or cyanosis in extremities Lymphatic: No palpable cervical or axillary lymphadenopathy Skin: Warm, dry; no sign of jaundice Psychiatric - alert and oriented x 4; calm mood and affect    Assessment & Plan Courtney Key K. Marybel Alcott Courtney Bennett; 10/18/2020 11:04 AM)  MICROCALCIFICATION OF RIGHT BREAST ON MAMMOGRAM (R92.0)  Current Plans Schedule for Surgery - Right radioactive seed localized lumpectomy. The surgical procedure has been discussed with the patient. Potential risks, benefits, alternative treatments, and expected outcomes have been explained. All of the patient's questions at this time have been answered. The likelihood of reaching the patient's treatment goal is good. The patient understand the proposed surgical procedure and wishes to proceed.  Courtney Bennett. Georgette Dover, Courtney Bennett, [facility]  General/ Trauma Surgery   10/18/2020 12:11 PM

## 2020-10-21 ENCOUNTER — Other Ambulatory Visit: Payer: Self-pay | Admitting: Surgery

## 2020-10-21 DIAGNOSIS — R920 Mammographic microcalcification found on diagnostic imaging of breast: Secondary | ICD-10-CM

## 2020-10-23 NOTE — Progress Notes (Signed)
Ivey at Dr Tsuei's office made aware that pt would like to reschedule her surgery. I also told Ermalinda Barrios that patient's A1c was 14.1 and her sugars were >300 and would need better control before she gets rescheduled.

## 2020-10-29 ENCOUNTER — Inpatient Hospital Stay: Admission: RE | Admit: 2020-10-29 | Payer: Medicare Other | Source: Ambulatory Visit

## 2020-12-09 ENCOUNTER — Encounter (HOSPITAL_COMMUNITY): Payer: Self-pay | Admitting: Physician Assistant

## 2020-12-09 ENCOUNTER — Inpatient Hospital Stay: Admission: RE | Admit: 2020-12-09 | Payer: Medicare Other | Source: Ambulatory Visit

## 2020-12-09 NOTE — Progress Notes (Signed)
12/10/20 surgery was cancelled.

## 2020-12-10 ENCOUNTER — Ambulatory Visit (HOSPITAL_COMMUNITY): Admission: RE | Admit: 2020-12-10 | Payer: Medicare Other | Source: Home / Self Care | Admitting: Surgery

## 2020-12-10 SURGERY — BREAST LUMPECTOMY WITH RADIOACTIVE SEED LOCALIZATION
Anesthesia: General | Site: Breast | Laterality: Right

## 2021-02-04 ENCOUNTER — Other Ambulatory Visit: Payer: Self-pay | Admitting: Urgent Care

## 2021-02-04 DIAGNOSIS — E041 Nontoxic single thyroid nodule: Secondary | ICD-10-CM

## 2021-02-06 ENCOUNTER — Ambulatory Visit
Admission: RE | Admit: 2021-02-06 | Discharge: 2021-02-06 | Disposition: A | Discharge status: Home / Self Care | Payer: Medicare Other | Source: Ambulatory Visit | Attending: Urgent Care | Admitting: Urgent Care

## 2021-02-06 ENCOUNTER — Other Ambulatory Visit: Payer: Self-pay

## 2021-02-06 DIAGNOSIS — E041 Nontoxic single thyroid nodule: Secondary | ICD-10-CM

## 2021-08-31 IMAGING — CT CT HEAD W/O CM
4 series · 16 of 47 positions shown, 18 images · non-contrast
Comparison: None.

CLINICAL DATA: Altered mental status.

EXAM:
CT HEAD WITHOUT CONTRAST
TECHNIQUE: Contiguous axial images were obtained from the base of the skull
through the vertex without intravenous contrast.

[Series 3: head wo · axial · 0.54mm/px · z∈[-99,+31]mm · 7 of 36 slices shown, 9 images]
[im 5/36  brain]
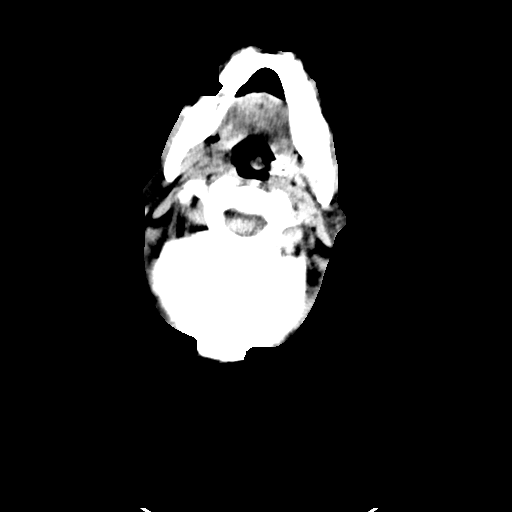
[im 5/36  bone]
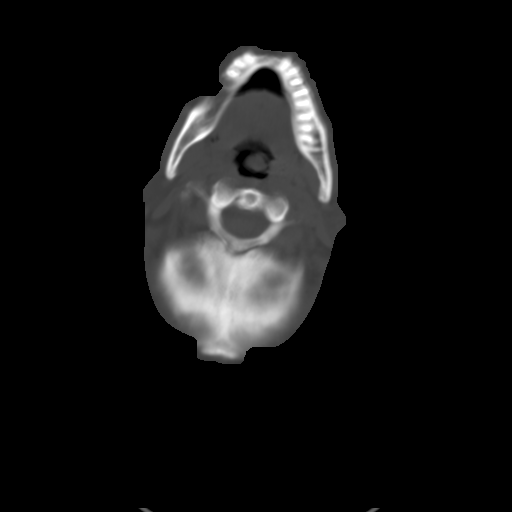
[im 9/36  brain]
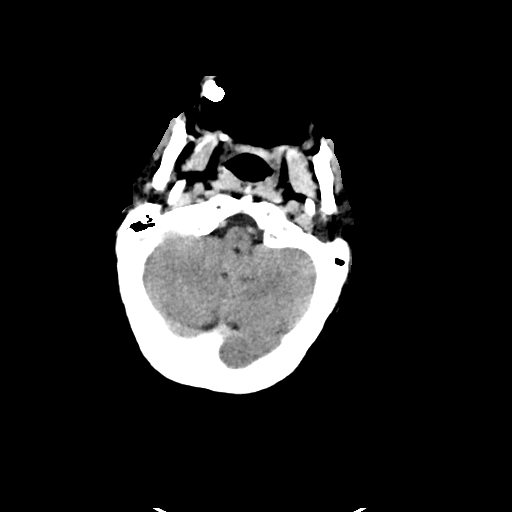
[im 14/36  brain]
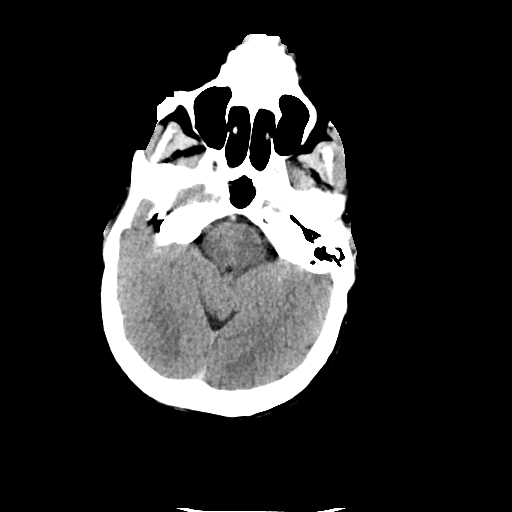
[im 18/36  brain]
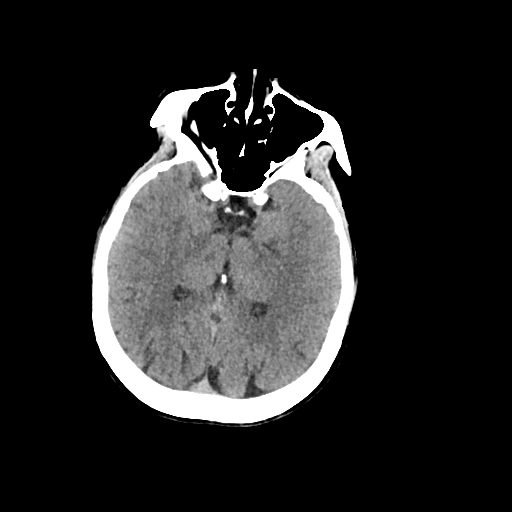
[im 22/36  brain]
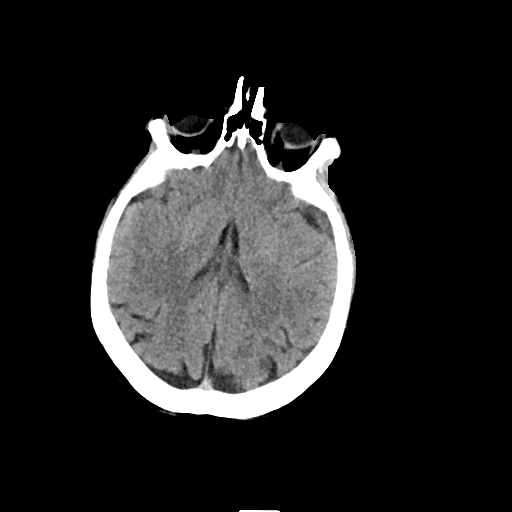
[im 22/36  bone]
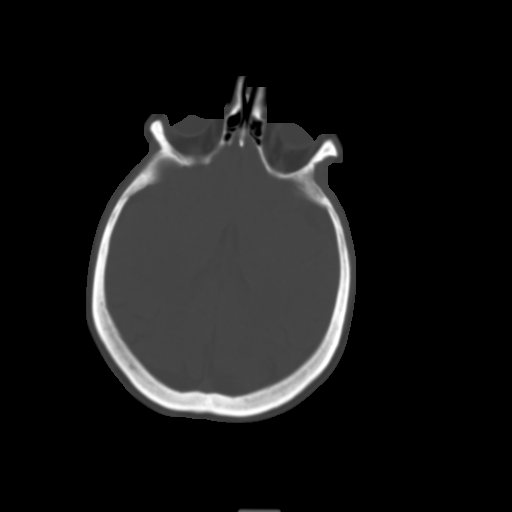
[im 27/36  brain]
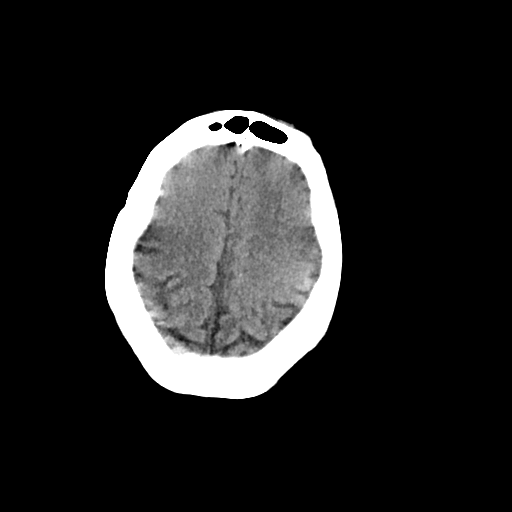
[im 31/36  brain]
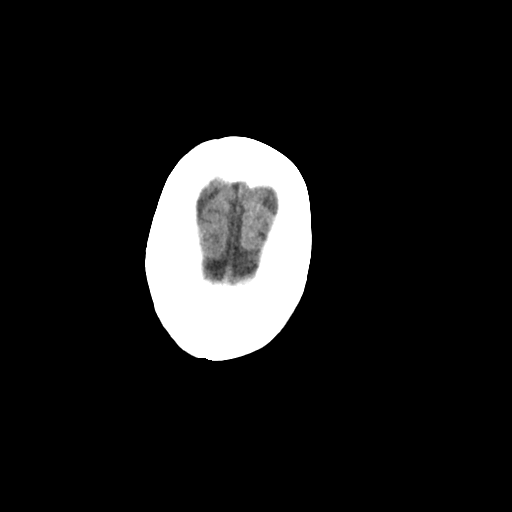

[Series 4: head bone · axial · 0.54mm/px · z∈[-103,-67]mm · 3 of 88 slices shown]
[im 9/88  bone]
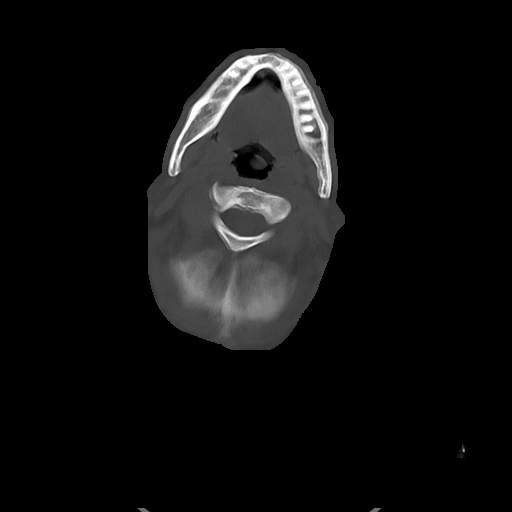
[im 18/88  bone]
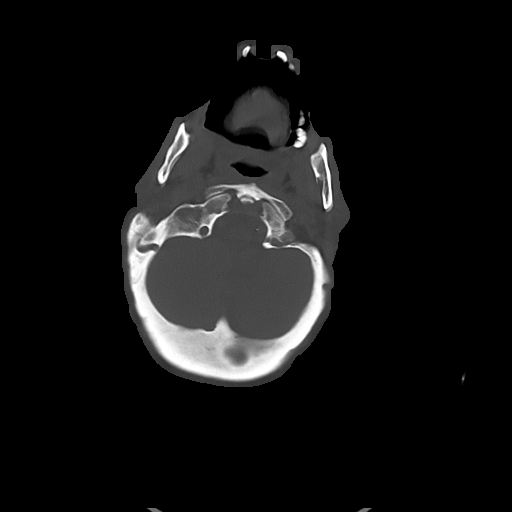
[im 27/88  bone]
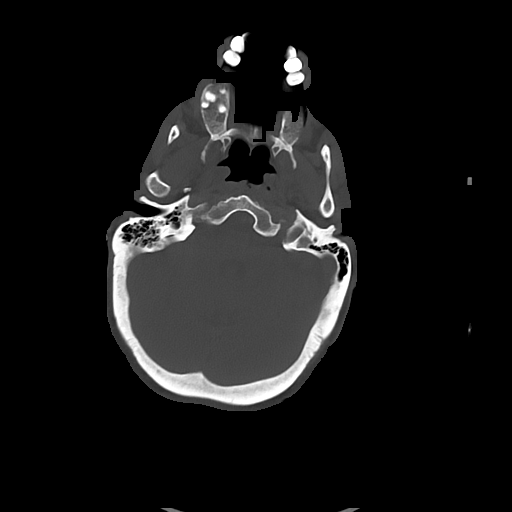

[Series 5: cor soft · coronal · 0.32mm/px · 3 of 72 slices shown]
[im 24/72  brain]
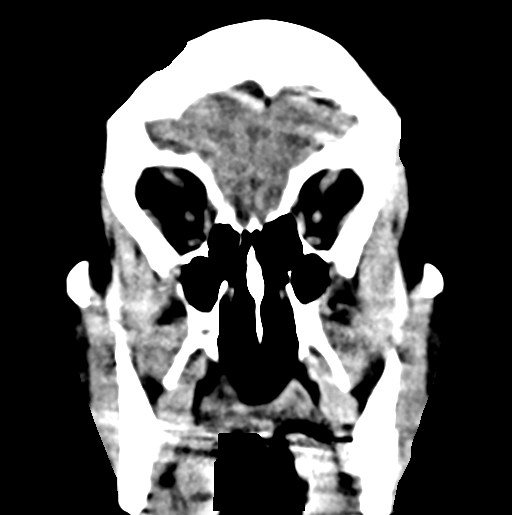
[im 32/72  brain]
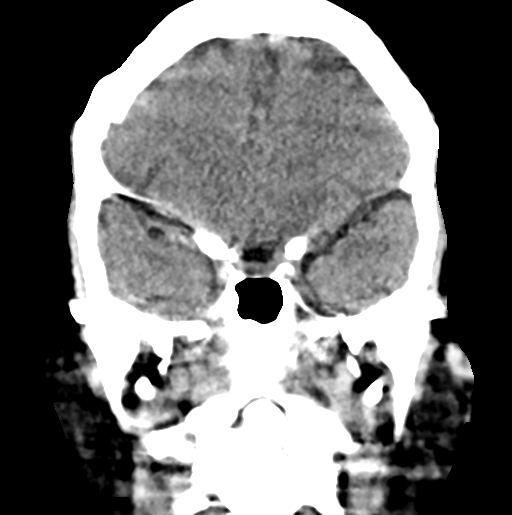
[im 40/72  brain]
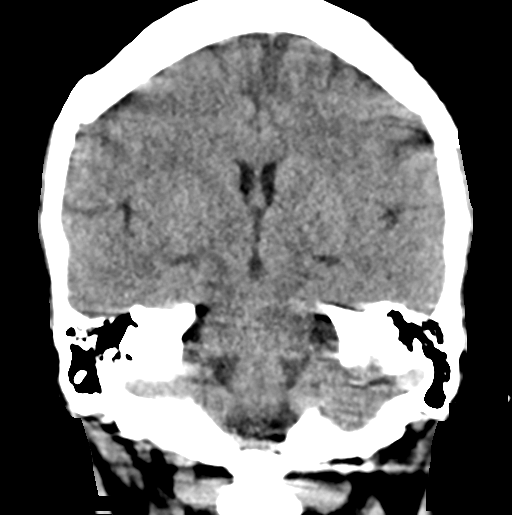

[Series 6: sag soft · sagittal · 0.32mm/px · 3 of 53 slices shown]
[im 18/53  brain]
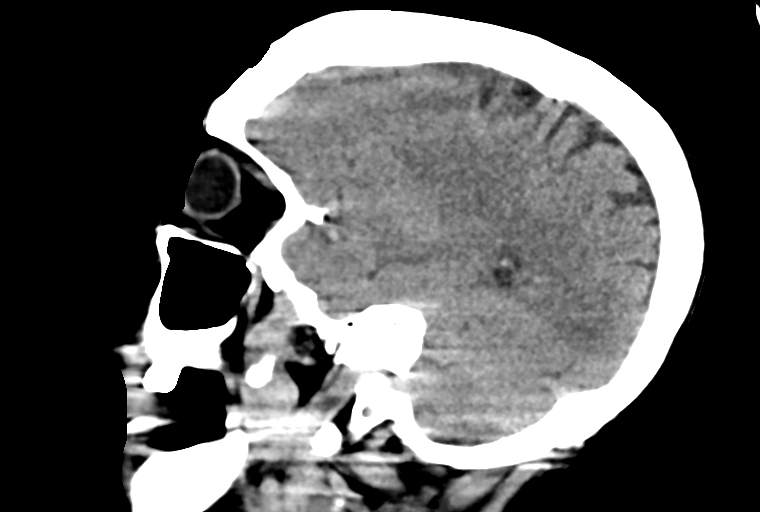
[im 27/53  brain]
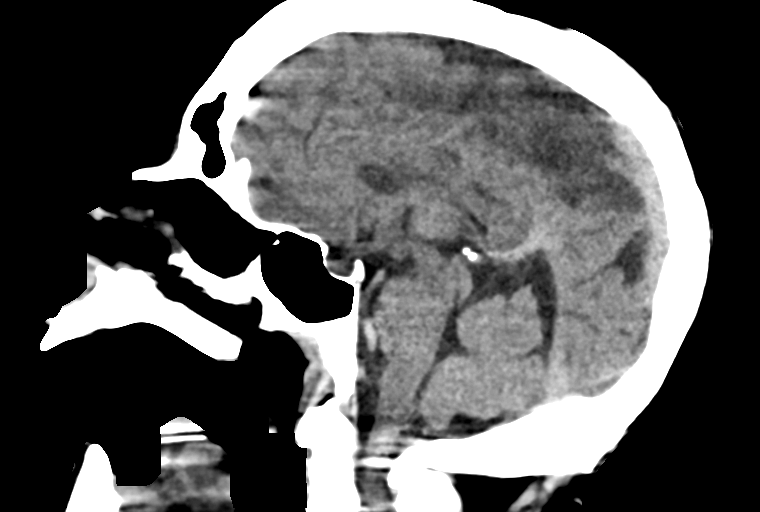
[im 35/53  brain]
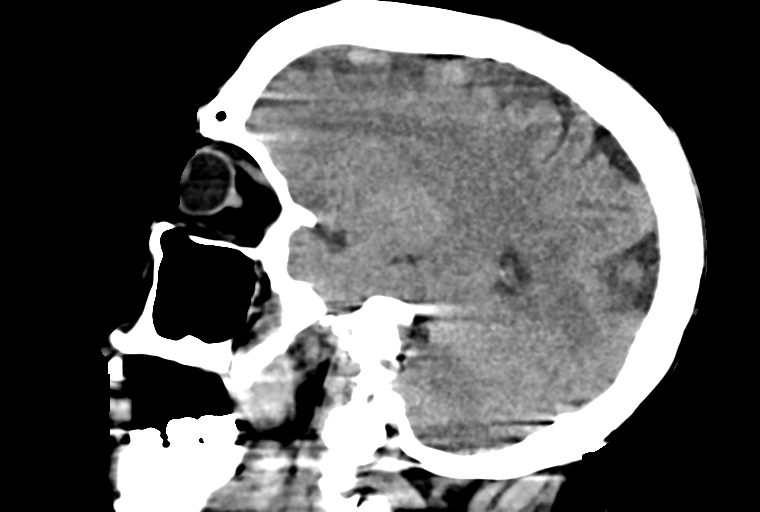

[16 of 47 positions shown; findings below may reference images not displayed]

FINDINGS: Brain: No evidence of acute infarction, hemorrhage, hydrocephalus,
extra-axial collection or mass lesion/mass effect.

Vascular: No hyperdense vessel or unexpected calcification.

Skull: Normal. Negative for fracture or focal lesion.

Sinuses/Orbits: No acute finding.

Other: None.
IMPRESSION: No acute intracranial pathology.

## 2021-08-31 IMAGING — CT CT ABD-PELV W/O CM
2 of 4 series · 16 of 46 positions shown, 18 images · non-contrast
Comparison: December 06, 2019

CLINICAL DATA: Abdominal pain with coffee ground emesis.

EXAM:
CT ABDOMEN AND PELVIS WITHOUT CONTRAST
TECHNIQUE: Multidetector CT imaging of the abdomen and pelvis was performed
following the standard protocol without IV contrast.

[Series 3: ap without · axial · non-contrast · 0.65mm/px · z∈[-783,-368]mm · 13 of 93 slices shown, 15 images]
[im 5/93  soft-tissue]
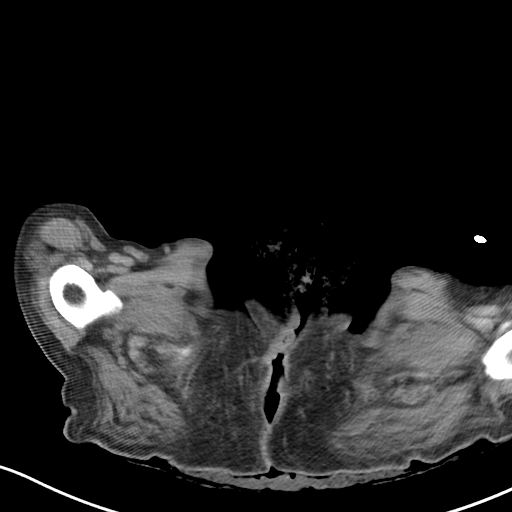
[im 5/93  bone]
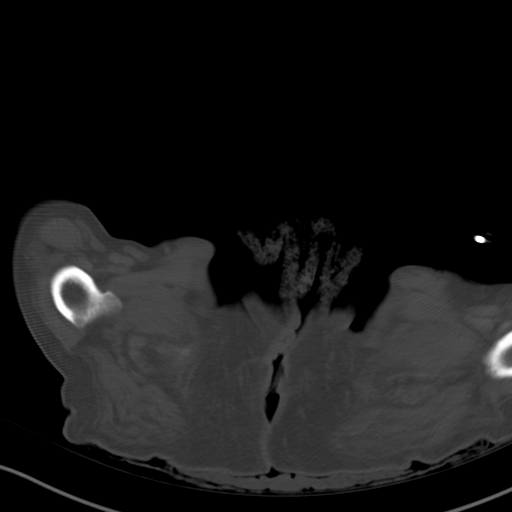
[im 15/93  soft-tissue]
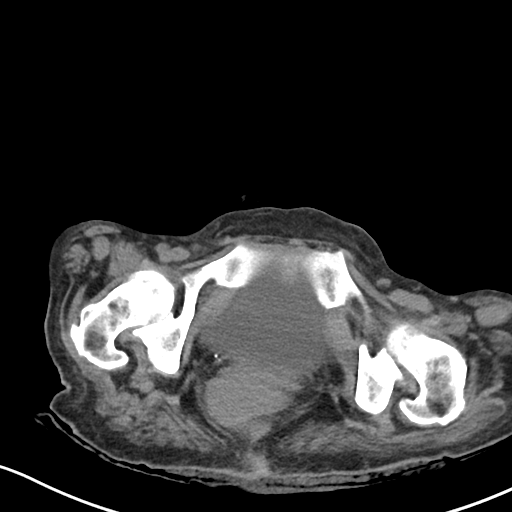
[im 20/93  soft-tissue]
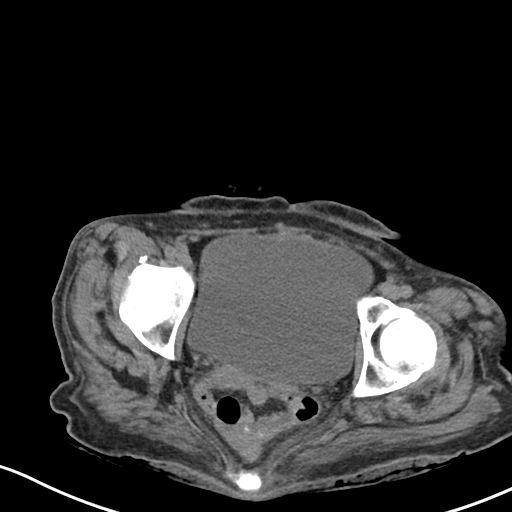
[im 25/93  soft-tissue]
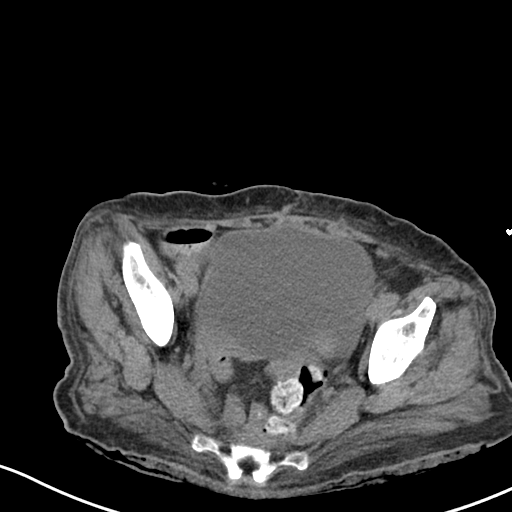
[im 34/93  soft-tissue]
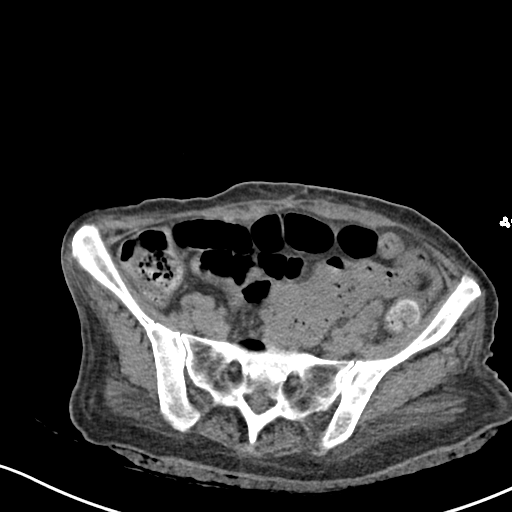
[im 39/93  soft-tissue]
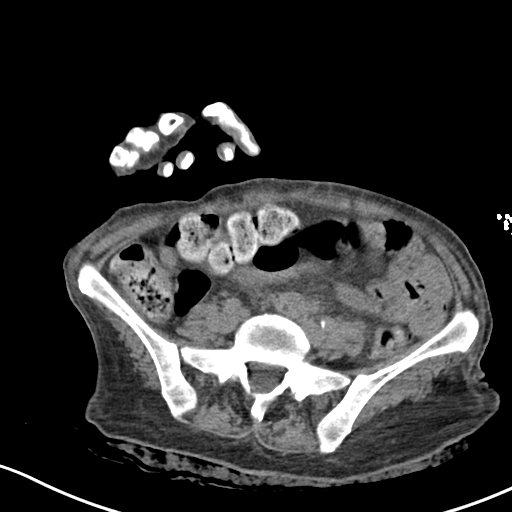
[im 49/93  soft-tissue]
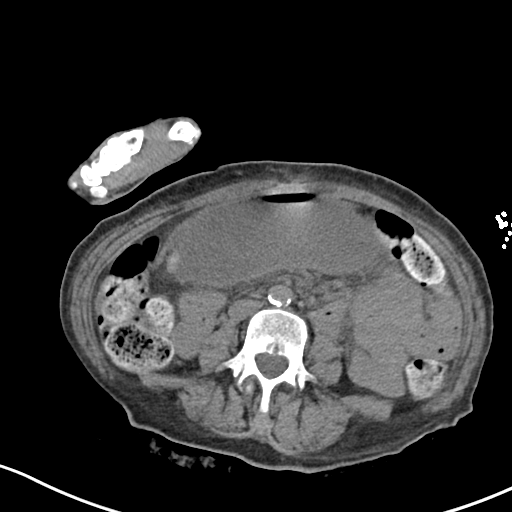
[im 54/93  soft-tissue]
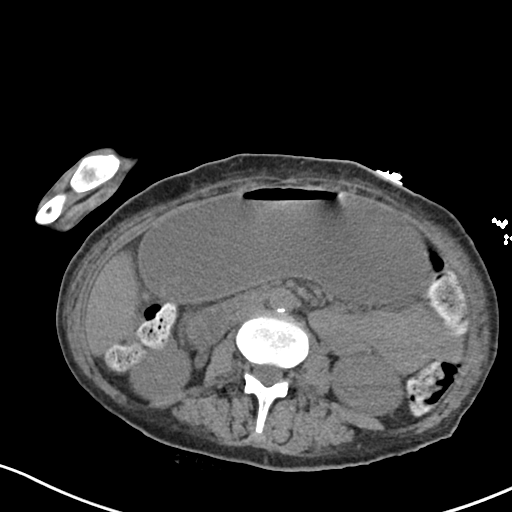
[im 59/93  soft-tissue]
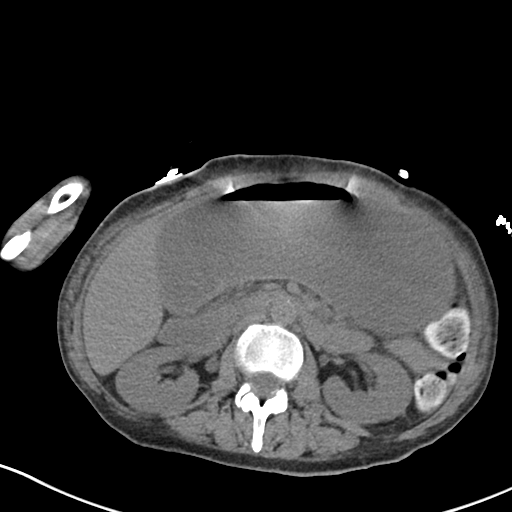
[im 59/93  bone]
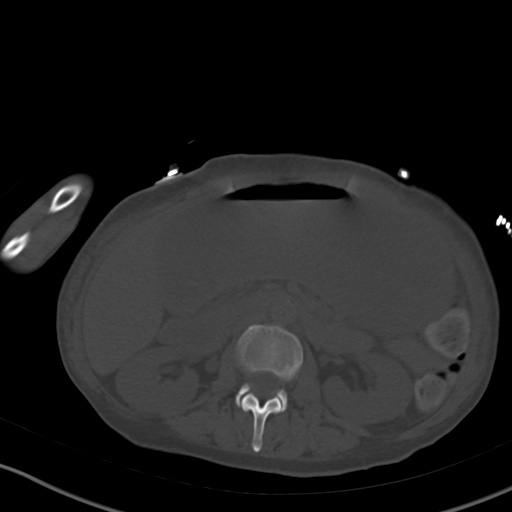
[im 68/93  soft-tissue]
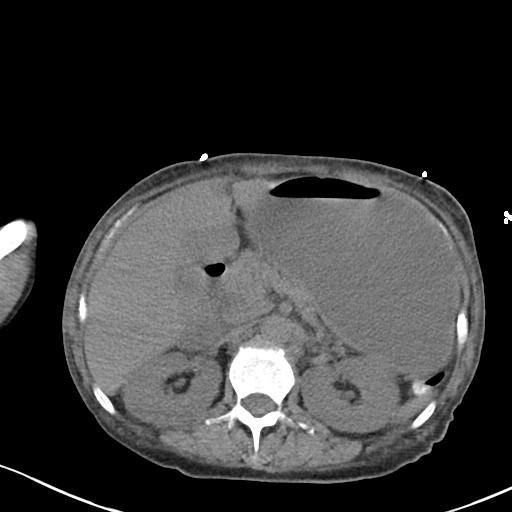
[im 73/93  soft-tissue]
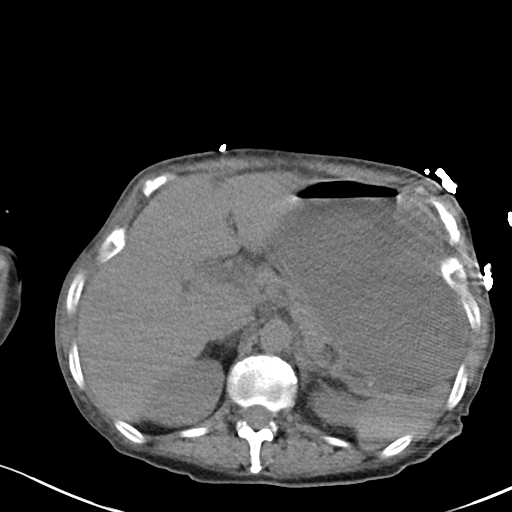
[im 78/93  soft-tissue]
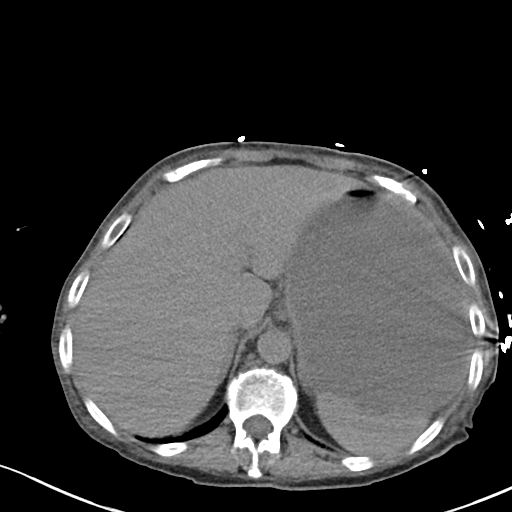
[im 88/93  soft-tissue]
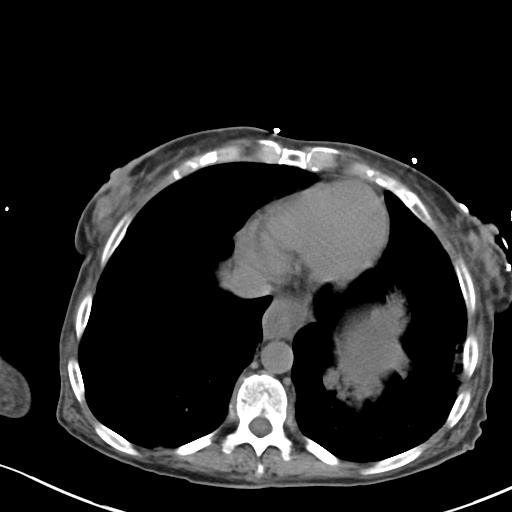

[Series 6: cor · coronal · 0.71mm/px · 3 of 82 slices shown]
[im 28/82  soft-tissue]
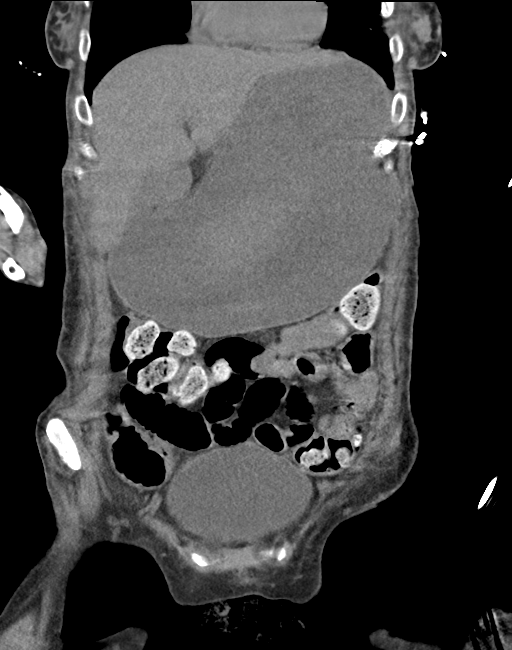
[im 37/82  soft-tissue]
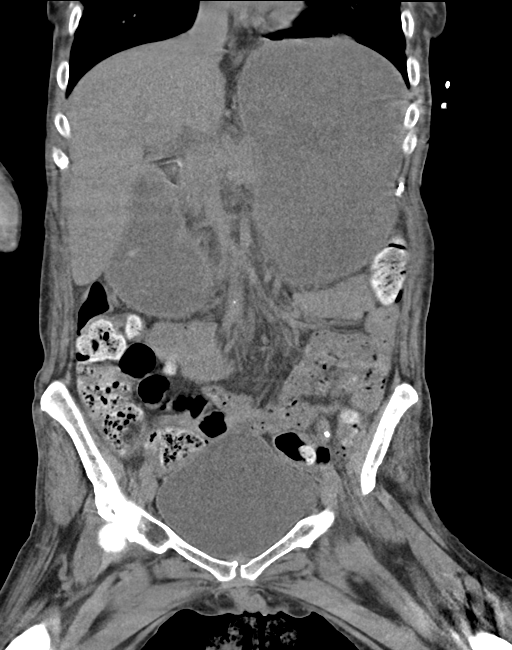
[im 46/82  soft-tissue]
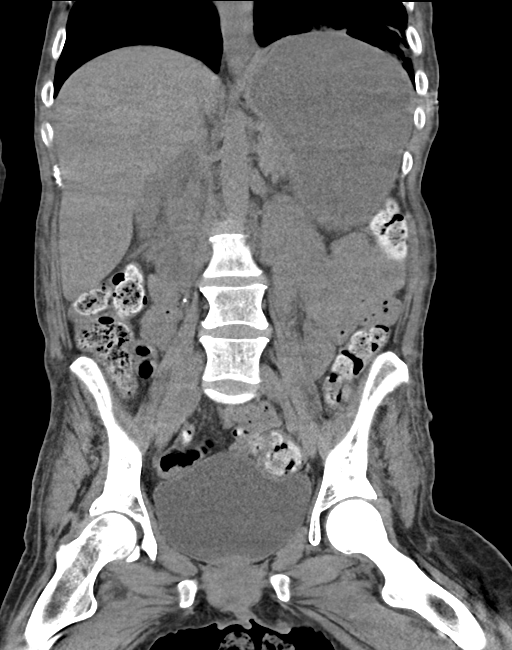

[16 of 46 positions shown; findings below may reference images not displayed]

FINDINGS: Lower chest: Mild patchy areas of atelectasis and/or infiltrate are
seen within the bilateral lung bases.

Hepatobiliary: No focal liver abnormality is seen. No gallstones,
gallbladder wall thickening, or biliary dilatation.

Pancreas: Unremarkable. No pancreatic ductal dilatation or
surrounding inflammatory changes.

Spleen: Normal in size without focal abnormality.

Adrenals/Urinary Tract: Adrenal glands are unremarkable. Kidneys are
normal, without renal calculi, focal lesion, or hydronephrosis.
Bladder is unremarkable.

Stomach/Bowel: The stomach is markedly distended. There is a small
hiatal hernia. Appendix appears normal. No evidence of bowel wall
thickening, distention, or inflammatory changes. Noninflamed
diverticula are seen within the proximal sigmoid colon.

Vascular/Lymphatic: There is moderate severity calcification of the
abdominal aorta and bilateral common iliac arteries. No enlarged
abdominal or pelvic lymph nodes.

Reproductive: Status post hysterectomy. No adnexal masses.

Other: No abdominal wall hernia or abnormality. No abdominopelvic
ascites.

Musculoskeletal: No acute or significant osseous findings.
IMPRESSION: 1. Mild patchy areas of bibasilar atelectasis and/or infiltrate.
2. Small hiatal hernia.
3. Marked severity gastric distension. Sequelae associated with
gastric outlet obstruction cannot be excluded.
4. Sigmoid diverticulosis.

Aortic Atherosclerosis ([id number].T).

## 2021-09-01 ENCOUNTER — Other Ambulatory Visit: Payer: Self-pay | Admitting: Family Medicine

## 2021-09-01 ENCOUNTER — Other Ambulatory Visit (HOSPITAL_COMMUNITY): Payer: Self-pay | Admitting: Family Medicine

## 2021-09-01 DIAGNOSIS — F17210 Nicotine dependence, cigarettes, uncomplicated: Secondary | ICD-10-CM

## 2021-09-02 ENCOUNTER — Other Ambulatory Visit: Payer: Self-pay | Admitting: Family Medicine

## 2021-09-02 DIAGNOSIS — N6310 Unspecified lump in the right breast, unspecified quadrant: Secondary | ICD-10-CM

## 2021-09-08 LAB — EXTERNAL GENERIC LAB PROCEDURE: COLOGUARD: NEGATIVE

## 2021-09-08 LAB — COLOGUARD: COLOGUARD: NEGATIVE

## 2021-09-12 ENCOUNTER — Other Ambulatory Visit: Payer: Self-pay | Admitting: Family Medicine

## 2021-09-12 DIAGNOSIS — N6452 Nipple discharge: Secondary | ICD-10-CM

## 2021-09-24 ENCOUNTER — Ambulatory Visit: Admission: RE | Admit: 2021-09-24 | Payer: Medicare Other | Source: Ambulatory Visit

## 2021-09-24 ENCOUNTER — Ambulatory Visit
Admission: RE | Admit: 2021-09-24 | Discharge: 2021-09-24 | Disposition: A | Discharge status: Home / Self Care | Payer: Medicare Other | Source: Ambulatory Visit | Attending: Family Medicine | Admitting: Family Medicine

## 2021-09-24 DIAGNOSIS — N6310 Unspecified lump in the right breast, unspecified quadrant: Secondary | ICD-10-CM

## 2021-10-08 ENCOUNTER — Other Ambulatory Visit: Payer: Self-pay | Admitting: Family Medicine

## 2021-10-08 DIAGNOSIS — E042 Nontoxic multinodular goiter: Secondary | ICD-10-CM

## 2021-10-17 ENCOUNTER — Ambulatory Visit
Admission: RE | Admit: 2021-10-17 | Discharge: 2021-10-17 | Disposition: A | Discharge status: Home / Self Care | Payer: Medicare Other | Source: Ambulatory Visit | Attending: Family Medicine | Admitting: Family Medicine

## 2021-10-17 DIAGNOSIS — E042 Nontoxic multinodular goiter: Secondary | ICD-10-CM

## 2021-10-20 ENCOUNTER — Other Ambulatory Visit: Payer: Self-pay | Admitting: Family Medicine

## 2021-11-10 ENCOUNTER — Ambulatory Visit
Admission: RE | Admit: 2021-11-10 | Discharge: 2021-11-10 | Disposition: A | Discharge status: Home / Self Care | Payer: Medicare Other | Source: Ambulatory Visit | Attending: Family Medicine | Admitting: Family Medicine

## 2021-11-10 ENCOUNTER — Inpatient Hospital Stay: Admission: RE | Admit: 2021-11-10 | Payer: Medicare Other | Source: Ambulatory Visit

## 2021-11-10 DIAGNOSIS — F17210 Nicotine dependence, cigarettes, uncomplicated: Secondary | ICD-10-CM

## 2022-01-22 DIAGNOSIS — L97519 Non-pressure chronic ulcer of other part of right foot with unspecified severity: Secondary | ICD-10-CM | POA: Insufficient documentation

## 2022-01-22 DIAGNOSIS — Z5900 Homelessness unspecified: Secondary | ICD-10-CM

## 2022-01-22 DIAGNOSIS — J449 Chronic obstructive pulmonary disease, unspecified: Secondary | ICD-10-CM | POA: Diagnosis present

## 2022-11-04 ENCOUNTER — Ambulatory Visit (INDEPENDENT_AMBULATORY_CARE_PROVIDER_SITE_OTHER): Payer: 59 | Admitting: Podiatry

## 2022-11-04 ENCOUNTER — Encounter: Payer: Self-pay | Admitting: Podiatry

## 2022-11-04 DIAGNOSIS — E1142 Type 2 diabetes mellitus with diabetic polyneuropathy: Secondary | ICD-10-CM | POA: Diagnosis not present

## 2022-11-04 DIAGNOSIS — M2010 Hallux valgus (acquired), unspecified foot: Secondary | ICD-10-CM | POA: Diagnosis not present

## 2022-11-04 NOTE — Progress Notes (Signed)
This patient presents to the office for diabetic foot exam. This patient says there is no pain or discomfort in her feet.  No history of infection or drainage.  This patient presents to the office for foot exam due to having a history of diabetes.  Vascular  Dorsalis pedis and posterior tibial pulses are palpable  B/L.  Capillary return  WNL.  Temperature gradient is  WNL.  Skin turgor  WNL  Sensorium  Senn Weinstein monofilament wire diminished .  Diminished tactile sensation.  Nail Exam  Patient has normal nails with no evidence of bacterial or fungal infection.  Orthopedic  Exam  Muscle tone and muscle strength  WNL.  No limitations of motion feet  B/L.  No crepitus or joint effusion noted.  Foot type is unremarkable and digits show no abnormalities.  HAV  B/L.  Skin  No open lesions.  Normal skin texture and turgor.   Diabetes with no complications  HAV  B/L.  Diabetic foot exam was performed.  There is no evidence of vascular pathology with neuropathy.  RTC  1 year.   Helane Gunther DPM

## 2022-11-04 NOTE — Progress Notes (Signed)
Patient presents today to be measured for diabetic shoes and insoles.  Patient was measured for 1 pair of diabetic shoes and 3 pairs of foam casted diabetic insoles.   Shoe size  58m  Wt 98lbs  Diabetes Doctor : dr Barbee [place] style [facility] [phone]   Re-appointment for regularly scheduled diabetic foot care visits or if they should experience any trouble with the shoes or insoles   Helane Gunther DPM

## 2022-11-05 ENCOUNTER — Other Ambulatory Visit: Payer: Self-pay | Admitting: Nurse Practitioner

## 2022-11-05 DIAGNOSIS — Z1231 Encounter for screening mammogram for malignant neoplasm of breast: Secondary | ICD-10-CM

## 2022-11-20 ENCOUNTER — Telehealth: Payer: Self-pay | Admitting: Podiatry

## 2022-11-20 NOTE — Telephone Encounter (Signed)
Called pt & lvm informing her that her inserts/shoes are not in they takes about 6 weeks to be ready.

## 2022-11-29 ENCOUNTER — Emergency Department (HOSPITAL_COMMUNITY): Payer: 59

## 2022-11-29 ENCOUNTER — Encounter (HOSPITAL_COMMUNITY): Payer: Self-pay

## 2022-11-29 ENCOUNTER — Observation Stay (HOSPITAL_COMMUNITY)
Admission: EM | Admit: 2022-11-29 | Discharge: 2022-12-01 | Disposition: A | Discharge status: Home / Self Care | Payer: 59 | Attending: Student in an Organized Health Care Education/Training Program | Admitting: Student in an Organized Health Care Education/Training Program

## 2022-11-29 DIAGNOSIS — F310 Bipolar disorder, current episode hypomanic: Secondary | ICD-10-CM | POA: Diagnosis present

## 2022-11-29 DIAGNOSIS — K310 Acute dilatation of stomach: Secondary | ICD-10-CM | POA: Diagnosis not present

## 2022-11-29 DIAGNOSIS — J690 Pneumonitis due to inhalation of food and vomit: Principal | ICD-10-CM | POA: Diagnosis present

## 2022-11-29 DIAGNOSIS — J9690 Respiratory failure, unspecified, unspecified whether with hypoxia or hypercapnia: Secondary | ICD-10-CM | POA: Diagnosis present

## 2022-11-29 DIAGNOSIS — E109 Type 1 diabetes mellitus without complications: Secondary | ICD-10-CM | POA: Diagnosis present

## 2022-11-29 DIAGNOSIS — E873 Alkalosis: Secondary | ICD-10-CM | POA: Diagnosis not present

## 2022-11-29 DIAGNOSIS — Z794 Long term (current) use of insulin: Secondary | ICD-10-CM | POA: Insufficient documentation

## 2022-11-29 DIAGNOSIS — R739 Hyperglycemia, unspecified: Secondary | ICD-10-CM

## 2022-11-29 DIAGNOSIS — I10 Essential (primary) hypertension: Secondary | ICD-10-CM | POA: Diagnosis not present

## 2022-11-29 DIAGNOSIS — K3189 Other diseases of stomach and duodenum: Secondary | ICD-10-CM | POA: Diagnosis present

## 2022-11-29 DIAGNOSIS — J449 Chronic obstructive pulmonary disease, unspecified: Secondary | ICD-10-CM | POA: Diagnosis present

## 2022-11-29 DIAGNOSIS — J9601 Acute respiratory failure with hypoxia: Secondary | ICD-10-CM | POA: Diagnosis not present

## 2022-11-29 DIAGNOSIS — Z5900 Homelessness unspecified: Secondary | ICD-10-CM

## 2022-11-29 DIAGNOSIS — Z91040 Latex allergy status: Secondary | ICD-10-CM | POA: Diagnosis not present

## 2022-11-29 DIAGNOSIS — Z79899 Other long term (current) drug therapy: Secondary | ICD-10-CM | POA: Insufficient documentation

## 2022-11-29 DIAGNOSIS — R112 Nausea with vomiting, unspecified: Secondary | ICD-10-CM | POA: Diagnosis present

## 2022-11-29 DIAGNOSIS — E1065 Type 1 diabetes mellitus with hyperglycemia: Secondary | ICD-10-CM | POA: Diagnosis not present

## 2022-11-29 LAB — URINALYSIS, W/ REFLEX TO CULTURE (INFECTION SUSPECTED)
Bilirubin Urine: NEGATIVE
Glucose, UA: >=500 mg/dL — AB
Hgb urine dipstick: NEGATIVE
Ketones, ur: NEGATIVE mg/dL
Leukocytes,Ua: NEGATIVE
Nitrite: NEGATIVE
Protein, ur: NEGATIVE mg/dL
Specific Gravity, Urine: 1.02 (ref 1.005–1.030)
pH: 7 (ref 5.0–8.0)

## 2022-11-29 LAB — COMPREHENSIVE METABOLIC PANEL
ALT: 11 U/L (ref 0–44)
AST: 14 U/L — ABNORMAL LOW (ref 15–41)
Albumin: 3 g/dL — ABNORMAL LOW (ref 3.5–5.0)
Alkaline Phosphatase: 61 U/L (ref 38–126)
Anion gap: 8 (ref 5–15)
BUN: 19 mg/dL (ref 6–20)
CO2: 28 mmol/L (ref 22–32)
Calcium: 8.6 mg/dL — ABNORMAL LOW (ref 8.9–10.3)
Chloride: 101 mmol/L (ref 98–111)
Creatinine, Ser: 0.83 mg/dL (ref 0.44–1.00)
GFR, Estimated: >60 mL/min (ref >60)
Glucose, Bld: 311 mg/dL — ABNORMAL HIGH (ref 70–99)
Potassium: 4.2 mmol/L (ref 3.5–5.1)
Sodium: 137 mmol/L (ref 135–145)
Total Bilirubin: 0.4 mg/dL (ref 0.3–1.2)
Total Protein: 5.4 g/dL — ABNORMAL LOW (ref 6.5–8.1)

## 2022-11-29 LAB — CBC WITH DIFFERENTIAL/PLATELET
Abs Immature Granulocytes: 0.02 10*3/uL (ref 0.00–0.07)
Basophils Absolute: 0.1 10*3/uL (ref 0.0–0.1)
Basophils Relative: 1 %
Eosinophils Absolute: 0.2 10*3/uL (ref 0.0–0.5)
Eosinophils Relative: 2 %
HCT: 44.3 % (ref 36.0–46.0)
Hemoglobin: 14.1 g/dL (ref 12.0–15.0)
Immature Granulocytes: 0 %
Lymphocytes Relative: 21 %
Lymphs Abs: 1.7 10*3/uL (ref 0.7–4.0)
MCH: 30.8 pg (ref 26.0–34.0)
MCHC: 31.8 g/dL (ref 30.0–36.0)
MCV: 96.7 fL (ref 80.0–100.0)
Monocytes Absolute: 0.8 10*3/uL (ref 0.1–1.0)
Monocytes Relative: 10 %
Neutro Abs: 5.3 10*3/uL (ref 1.7–7.7)
Neutrophils Relative %: 66 %
Platelets: 240 10*3/uL (ref 150–400)
RBC: 4.58 MIL/uL (ref 3.87–5.11)
RDW: 13.1 % (ref 11.5–15.5)
WBC: 7.9 10*3/uL (ref 4.0–10.5)
nRBC: 0 % (ref 0.0–0.2)

## 2022-11-29 LAB — CBG MONITORING, ED: Glucose-Capillary: 298 mg/dL — ABNORMAL HIGH (ref 70–99)

## 2022-11-29 LAB — I-STAT VENOUS BLOOD GAS, ED
Acid-Base Excess: 7 mmol/L — ABNORMAL HIGH (ref 0.0–2.0)
Bicarbonate: 30.1 mmol/L — ABNORMAL HIGH (ref 20.0–28.0)
Calcium, Ion: 1.03 mmol/L — ABNORMAL LOW (ref 1.15–1.40)
HCT: 42 % (ref 36.0–46.0)
Hemoglobin: 14.3 g/dL (ref 12.0–15.0)
O2 Saturation: 99 %
Potassium: 4.2 mmol/L (ref 3.5–5.1)
Sodium: 137 mmol/L (ref 135–145)
TCO2: 31 mmol/L (ref 22–32)
pCO2, Ven: 38.2 mmHg — ABNORMAL LOW (ref 44–60)
pH, Ven: 7.505 — ABNORMAL HIGH (ref 7.25–7.43)
pO2, Ven: 120 mmHg — ABNORMAL HIGH (ref 32–45)

## 2022-11-29 LAB — LIPASE, BLOOD: Lipase: 33 U/L (ref 11–51)

## 2022-11-29 LAB — TROPONIN I (HIGH SENSITIVITY): Troponin I (High Sensitivity): 27 ng/L — ABNORMAL HIGH (ref <18)

## 2022-11-29 MED ORDER — IOHEXOL 350 MG/ML SOLN
75.0000 mL | Freq: Once | INTRAVENOUS | Status: AC | PRN
  Administered 2022-11-29: 75 mL via INTRAVENOUS

## 2022-11-29 MED ORDER — ONDANSETRON HCL 4 MG/2ML IJ SOLN
4.0000 mg | Freq: Once | INTRAMUSCULAR | Status: AC
  Administered 2022-11-29: 4 mg via INTRAVENOUS
  Filled 2022-11-29: qty 2

## 2022-11-29 MED ORDER — IPRATROPIUM-ALBUTEROL 0.5-2.5 (3) MG/3ML IN SOLN
3.0000 mL | Freq: Once | RESPIRATORY_TRACT | Status: AC
Start: 2022-11-29 — End: 2022-11-29
  Administered 2022-11-29: 3 mL via RESPIRATORY_TRACT
  Filled 2022-11-29: qty 3

## 2022-11-29 MED ORDER — LACTATED RINGERS IV BOLUS
1000.0000 mL | Freq: Once | INTRAVENOUS | Status: AC
  Administered 2022-11-29: 1000 mL via INTRAVENOUS

## 2022-11-29 NOTE — ED Provider Notes (Signed)
[facility] EMERGENCY DEPARTMENT AT [facility] Provider Note   CSN: [id number] Arrival date & time: 11/29/22  1844     History  Chief Complaint  Patient presents with   Hyperglycemia   Heat Exposure    Courtney Bennett is a 57 y.o. female.  Patient is a 57 year old female with a history of diabetes on Lantus and Humalog, hypertension, bipolar disease and being unhoused currently living in her car who is presenting today with EMS due to complaints of elevated blood sugar, nausea, vomiting as well as complaints of some left-sided chest and abdominal pain.  Patient reports that she has been trying to find a job and she has had a hard time keeping her insulin because her car is not working and getting to the pharmacy is difficult.  Also it has been very hot in her car does not have air conditioning.  She reports that she has been been following up with her doctor has gotten back on some of her medications and her A1c is improving but today she started feeling weak having nausea and vomiting which is the first time that that is happened recently.  She currently reports feeling better than she had.  She denies any abdominal pain or chest pain currently.  No fevers.  Patient does have a cough but she is uncertain how long it has been going on.  She does report smoking but does not use inhalers.  The history is provided by the patient.  Hyperglycemia      Home Medications Prior to Admission medications   Medication Sig Start Date End Date Taking? Authorizing Provider  albuterol (VENTOLIN HFA) 108 (90 Base) MCG/ACT inhaler Inhale 1 puff into the lungs as needed.     [provider]  atorvastatin (LIPITOR) 20 MG tablet Take 1 tablet (20 mg total) by mouth daily. 12/15/19   Alwyn Ren, MD  blood glucose meter kit and supplies Dispense based on patient and insurance preference. Use up to four times daily as directed. (FOR ICD-10 E10.9, E11.9). 12/15/19   Alwyn Ren, MD  budesonide-formoterol [facility]) 160-4.5 MCG/ACT inhaler Inhale 2 puffs into the lungs 2 (two) times daily.    [provider]  chlorhexidine (PERIDEX) 0.12 % solution as directed. 11/30/19   [provider]  Continuous Blood Gluc Sensor (FREESTYLE LIBRE 14 DAY SENSOR) MISC 1 application by Subdermal route See admin instructions. Check blood glucose 1-2 times daily. E11.65 05/11/19   CiriglianoJearld Lesch, DO  [facility] B-12 500 MCG tablet Take 500 mcg by mouth daily. 09/26/19   [provider]  dapagliflozin propanediol (FARXIGA) 10 MG TABS tablet Take 10 mg by mouth daily before breakfast. 08/14/19   Romero Belling, MD  estrogens, conjugated, (PREMARIN) 0.3 MG tablet Take 1 tablet (0.3 mg total) by mouth daily. 06/16/19   Cirigliano, Jearld Lesch, DO  fluconazole (DIFLUCAN) 200 MG tablet Take 4 tablets (800 mg total) by mouth daily. 12/15/19   Alwyn Ren, MD  gabapentin (NEURONTIN) 300 MG capsule Take 300 mg by mouth 2 (two) times daily.     [provider]  glucose blood (ACCU-CHEK AVIVA PLUS) test strip 1 each by Other route daily. And lancets 2/day 08/14/19   Romero Belling, MD  insulin detemir (LEVEMIR) 100 UNIT/ML FlexPen Inject 6 Units into the skin 2 (two) times daily. 12/15/19   Alwyn Ren, MD  Insulin Pen Needle 32G X 4 MM MISC 6 Units by Does not apply route 2 (  two) times daily. 12/15/19   Alwyn Ren, MD  ketoconazole (NIZORAL) 2 % shampoo APPLY 1 APPLICATION TOPICALLY 2 (TWO) TIMES A WEEK. 07/13/19   Cirigliano, Mary K, DO  lisinopril (ZESTRIL) 40 MG tablet Take 40 mg by mouth daily.  02/27/19   [provider]  omeprazole (PRILOSEC) 40 MG capsule Take 40 mg by mouth daily.    [provider]  sertraline (ZOLOFT) 100 MG tablet Take 1.5 tablets (150 mg total) by mouth daily. 05/05/19   Cirigliano, Jearld Lesch, DO  traMADol (ULTRAM) 50 MG tablet Take 50 mg by mouth 3 (three) times daily as needed for moderate pain.     [provider]  venlafaxine XR (EFFEXOR-XR) 75 MG 24 hr capsule Take 1 capsule (75 mg total) by mouth daily with breakfast. 06/08/19   Cirigliano, Jearld Lesch, DO      Allergies    Metformin, Elemental sulfur, Lamictal [lamotrigine], Latex, Sulfa antibiotics, and Tape    Review of Systems   Review of Systems  Physical Exam Updated Vital Signs BP (!) 160/74   Pulse 81   Temp 98.2 F (36.8 C)   Resp 14   LMP 11/24/2014   SpO2 94%  Physical Exam Vitals and nursing note reviewed.  Constitutional:      General: She is not in acute distress.    Appearance: She is well-developed.  HENT:     Head: Normocephalic and atraumatic.     Mouth/Throat:     Mouth: Mucous membranes are dry.  Eyes:     Pupils: Pupils are equal, round, and reactive to light.  Cardiovascular:     Rate and Rhythm: Normal rate and regular rhythm.     Heart sounds: Normal heart sounds. No murmur heard.    No friction rub.  Pulmonary:     Effort: Pulmonary effort is normal.     Breath sounds: Examination of the right-lower field reveals rhonchi. Examination of the left-lower field reveals rhonchi. Rhonchi present. No wheezing or rales.  Abdominal:     General: Bowel sounds are normal. There is no distension.     Palpations: Abdomen is soft.     Tenderness: There is no abdominal tenderness. There is no guarding or rebound.  Musculoskeletal:        General: No tenderness. Normal range of motion.     Right lower leg: No edema.     Left lower leg: No edema.     Comments: No edema  Skin:    General: Skin is warm and dry.     Findings: No rash.  Neurological:     Mental Status: She is alert and oriented to person, place, and time.     Cranial Nerves: No cranial nerve deficit.  Psychiatric:        Behavior: Behavior normal.     ED Results / Procedures / Treatments   Labs (all labs ordered are listed, but only abnormal results are displayed) Labs Reviewed  COMPREHENSIVE METABOLIC PANEL - Abnormal;  Notable for the following components:      Result Value   Glucose, Bld 311 (*)    Calcium 8.6 (*)    Total Protein 5.4 (*)    Albumin 3.0 (*)    AST 14 (*)    All other components within normal limits  URINALYSIS, W/ REFLEX TO CULTURE (INFECTION SUSPECTED) - Abnormal; Notable for the following components:   APPearance HAZY (*)    Glucose, UA >=500 (*)    Bacteria, UA RARE (*)  All other components within normal limits  CBG MONITORING, ED - Abnormal; Notable for the following components:   Glucose-Capillary 298 (*)    All other components within normal limits  I-STAT VENOUS BLOOD GAS, ED - Abnormal; Notable for the following components:   pH, Ven 7.505 (*)    pCO2, Ven 38.2 (*)    pO2, Ven 120 (*)    Bicarbonate 30.1 (*)    Acid-Base Excess 7.0 (*)    Calcium, Ion 1.03 (*)    All other components within normal limits  TROPONIN I (HIGH SENSITIVITY) - Abnormal; Notable for the following components:   Troponin I (High Sensitivity) 27 (*)    All other components within normal limits  CBC WITH DIFFERENTIAL/PLATELET  LIPASE, BLOOD    EKG EKG Interpretation Date/Time:  Sunday November 29 2022 18:59:44 EDT Ventricular Rate:  92 PR Interval:  195 QRS Duration:  89 QT Interval:  351 QTC Calculation: 435 R Axis:   69  Text Interpretation: Sinus rhythm Prominent P waves, nondiagnostic Nonspecific T abnormalities, lateral leads No significant change since last tracing Confirmed by Gwyneth Sprout ([id number]) on 11/29/2022 7:31:22 PM  Radiology DG Chest Port 1 View  Result Date: 11/29/2022 CLINICAL DATA:  Cough EXAM: PORTABLE CHEST 1 VIEW COMPARISON:  11/16/2022 FINDINGS: Cardiac shadow is within normal limits. Aortic calcifications are noted. Lungs are well aerated bilaterally. No focal infiltrate or effusion is seen. No bony abnormality is noted. IMPRESSION: No active disease. Electronically Signed   By: Alcide Clever M.D.   On: 11/29/2022 19:53    Procedures Procedures     Medications Ordered in ED Medications  lactated ringers bolus 1,000 mL (0 mLs Intravenous Stopped 11/29/22 2057)  ondansetron (ZOFRAN) injection 4 mg (4 mg Intravenous Given 11/29/22 1958)  ipratropium-albuterol (DUONEB) 0.5-2.5 (3) MG/3ML nebulizer solution 3 mL (3 mLs Nebulization Given 11/29/22 2057)  ondansetron (ZOFRAN) injection 4 mg (4 mg Intravenous Given 11/29/22 2256)  iohexol (OMNIPAQUE) 350 MG/ML injection 75 mL (75 mLs Intravenous Contrast Given 11/29/22 2305)    ED Course/ Medical Decision Making/ A&P                             Medical Decision Making Amount and/or Complexity of Data Reviewed Labs: ordered. Decision-making details documented in ED Course. Radiology: ordered and independent interpretation performed. Decision-making details documented in ED Course. ECG/medicine tests: ordered and independent interpretation performed. Decision-making details documented in ED Course.  Risk Prescription drug management. Decision regarding hospitalization.   Pt with multiple medical problems and comorbidities and presenting today with a complaint that caries a high risk for morbidity and mortality.  Here today with multiple complete.  Concern for possible DKA versus hyperglycemia and dehydration.  Patient has no abdominal pain at this time to suggest appendicitis, diverticulitis, cholecystitis or pancreatitis.  She denies any alcohol use.  She does have a cough and will ensure no evidence of pneumonia.  Pt also hypoxic here to 85% without hx of hypoxi a.  Low suspicion for ACS at this time.  I independently interpreted patient's EKG and labs.  EKG with nonspecific T wave abnormalities but those are present on prior EKG.  Will give patient IV fluids and she is requesting food and water.  VBG with respiratory alkalosis, CBC, UA, CMP without acute findings except for blood sugar of 311.  Normal anion gap, troponin mildly elevated at 27.  Lipase within normal limits. 11:06 PM Patient  given DuoNeb however  when removing oxygen she still drops to 85% on room air.  No wheezing present now at this time.  I have independently visualized and interpreted pt's images today.  Chest x-ray today without obvious signs of infiltrate.  Given patient's new hypoxia, mild elevated troponin we will do a CTA to ensure no evidence of PE.  Also possibility for aspiration pneumonia as she does report frequent coughing when eating.  However she is afebrile and not displaying septic symptoms at this time.          Final Clinical Impression(s) / ED Diagnoses Final diagnoses:  None    Rx / DC Orders ED Discharge Orders     None         Gwyneth Sprout, MD 11/29/22 2307

## 2022-11-29 NOTE — ED Notes (Signed)
Pt's O2 dropped to 85% on RA, after duoneb per MD request. 3lpm placed back onto pt, O2 increased to 95%. EDP made aware.

## 2022-11-29 NOTE — ED Triage Notes (Addendum)
Pt BIB [facility] from [employer] [facility]. Pt CBG 323, c/o of nausea/vomiting and acid reflux. Reports nausea has been nauseous for several days but vomiting just started today. Reporting  chest pain and left sided abdominal pain.   BP 150/78 HR 90 CBG 323 93 % room air

## 2022-11-29 NOTE — ED Notes (Signed)
1935 - pt's O2 ranging 88-89% on RA. Pt placed on 2lpm Hanna, O2 would not go above 90%. O2 increased to 3lpm, O2 95%

## 2022-11-29 NOTE — ED Notes (Signed)
Placed pt on 2 L of O2 sat of 90% on room air

## 2022-11-30 ENCOUNTER — Other Ambulatory Visit: Payer: Self-pay

## 2022-11-30 DIAGNOSIS — J440 Chronic obstructive pulmonary disease with acute lower respiratory infection: Secondary | ICD-10-CM

## 2022-11-30 DIAGNOSIS — E109 Type 1 diabetes mellitus without complications: Secondary | ICD-10-CM | POA: Diagnosis not present

## 2022-11-30 DIAGNOSIS — J690 Pneumonitis due to inhalation of food and vomit: Secondary | ICD-10-CM | POA: Diagnosis not present

## 2022-11-30 DIAGNOSIS — J9690 Respiratory failure, unspecified, unspecified whether with hypoxia or hypercapnia: Secondary | ICD-10-CM | POA: Diagnosis present

## 2022-11-30 DIAGNOSIS — K219 Gastro-esophageal reflux disease without esophagitis: Secondary | ICD-10-CM | POA: Diagnosis not present

## 2022-11-30 DIAGNOSIS — K3189 Other diseases of stomach and duodenum: Secondary | ICD-10-CM | POA: Diagnosis present

## 2022-11-30 LAB — CBC
HCT: 48.3 % — ABNORMAL HIGH (ref 36.0–46.0)
Hemoglobin: 15.1 g/dL — ABNORMAL HIGH (ref 12.0–15.0)
MCH: 30.6 pg (ref 26.0–34.0)
MCHC: 31.3 g/dL (ref 30.0–36.0)
MCV: 97.8 fL (ref 80.0–100.0)
Platelets: 233 10*3/uL (ref 150–400)
RBC: 4.94 MIL/uL (ref 3.87–5.11)
RDW: 12.9 % (ref 11.5–15.5)
WBC: 7.9 10*3/uL (ref 4.0–10.5)
nRBC: 0 % (ref 0.0–0.2)

## 2022-11-30 LAB — BASIC METABOLIC PANEL
Anion gap: 12 (ref 5–15)
BUN: 16 mg/dL (ref 6–20)
CO2: 26 mmol/L (ref 22–32)
Calcium: 8.6 mg/dL — ABNORMAL LOW (ref 8.9–10.3)
Chloride: 98 mmol/L (ref 98–111)
Creatinine, Ser: 0.55 mg/dL (ref 0.44–1.00)
GFR, Estimated: >60 mL/min (ref >60)
Glucose, Bld: 400 mg/dL — ABNORMAL HIGH (ref 70–99)
Potassium: 4.9 mmol/L (ref 3.5–5.1)
Sodium: 136 mmol/L (ref 135–145)

## 2022-11-30 LAB — GLUCOSE, CAPILLARY
Glucose-Capillary: 316 mg/dL — ABNORMAL HIGH (ref 70–99)
Glucose-Capillary: 379 mg/dL — ABNORMAL HIGH (ref 70–99)
Glucose-Capillary: 378 mg/dL — ABNORMAL HIGH (ref 70–99)
Glucose-Capillary: 333 mg/dL — ABNORMAL HIGH (ref 70–99)
Glucose-Capillary: 296 mg/dL — ABNORMAL HIGH (ref 70–99)

## 2022-11-30 LAB — HIV ANTIBODY (ROUTINE TESTING W REFLEX): HIV Screen 4th Generation wRfx: NONREACTIVE

## 2022-11-30 MED ORDER — DICYCLOMINE HCL 10 MG PO CAPS
10.0000 mg | ORAL_CAPSULE | Freq: Three times a day (TID) | ORAL | Status: DC
  Administered 2022-11-30: 10 mg via ORAL
  Filled 2022-11-30: qty 1

## 2022-11-30 MED ORDER — ENOXAPARIN SODIUM 30 MG/0.3ML IJ SOSY: 30.0000 mg | PREFILLED_SYRINGE | INTRAMUSCULAR | Status: DC

## 2022-11-30 MED ORDER — INSULIN ASPART 100 UNIT/ML IJ SOLN
8.0000 [IU] | Freq: Three times a day (TID) | INTRAMUSCULAR | Status: DC
  Administered 2022-11-30 – 2022-12-01 (×3): 8 [IU] via SUBCUTANEOUS

## 2022-11-30 MED ORDER — METHYLPREDNISOLONE SODIUM SUCC 125 MG IJ SOLR
125.0000 mg | Freq: Once | INTRAMUSCULAR | Status: AC
  Administered 2022-11-30: 125 mg via INTRAVENOUS
  Filled 2022-11-30: qty 2

## 2022-11-30 MED ORDER — SODIUM CHLORIDE 0.9 % IV SOLN
3.0000 g | Freq: Three times a day (TID) | INTRAVENOUS | Status: DC
  Administered 2022-11-30: 3 g via INTRAVENOUS

## 2022-11-30 MED ORDER — AMOXICILLIN-POT CLAVULANATE 875-125 MG PO TABS
1.0000 | ORAL_TABLET | Freq: Two times a day (BID) | ORAL | Status: DC
  Administered 2022-11-30 – 2022-12-01 (×3): 1 via ORAL
  Filled 2022-11-30 (×3): qty 1

## 2022-11-30 MED ORDER — INSULIN ASPART 100 UNIT/ML IJ SOLN
0.0000 [IU] | Freq: Three times a day (TID) | INTRAMUSCULAR | Status: DC
  Administered 2022-11-30 (×2): 15 [IU] via SUBCUTANEOUS

## 2022-11-30 MED ORDER — PANTOPRAZOLE SODIUM 40 MG PO TBEC
40.0000 mg | DELAYED_RELEASE_TABLET | Freq: Every day | ORAL | Status: DC
  Administered 2022-11-30 – 2022-12-01 (×2): 40 mg via ORAL
  Filled 2022-11-30 (×2): qty 1

## 2022-11-30 MED ORDER — PREDNISONE 20 MG PO TABS
40.0000 mg | ORAL_TABLET | Freq: Every day | ORAL | Status: DC
  Administered 2022-11-30: 40 mg via ORAL
  Filled 2022-11-30: qty 2

## 2022-11-30 MED ORDER — INSULIN ASPART 100 UNIT/ML IJ SOLN
0.0000 [IU] | Freq: Three times a day (TID) | INTRAMUSCULAR | Status: DC
  Administered 2022-12-01: 3 [IU] via SUBCUTANEOUS

## 2022-11-30 MED ORDER — UMECLIDINIUM BROMIDE 62.5 MCG/ACT IN AEPB
1.0000 | INHALATION_SPRAY | Freq: Every day | RESPIRATORY_TRACT | Status: DC
  Administered 2022-12-01: 1 via RESPIRATORY_TRACT
  Filled 2022-11-30: qty 7

## 2022-11-30 MED ORDER — IPRATROPIUM-ALBUTEROL 0.5-2.5 (3) MG/3ML IN SOLN
3.0000 mL | Freq: Once | RESPIRATORY_TRACT | Status: AC
  Administered 2022-11-30: 3 mL via RESPIRATORY_TRACT
  Filled 2022-11-30: qty 3

## 2022-11-30 MED ORDER — IPRATROPIUM-ALBUTEROL 0.5-2.5 (3) MG/3ML IN SOLN
3.0000 mL | RESPIRATORY_TRACT | Status: DC
  Filled 2022-11-30: qty 3

## 2022-11-30 MED ORDER — ENOXAPARIN SODIUM 40 MG/0.4ML IJ SOSY
40.0000 mg | PREFILLED_SYRINGE | INTRAMUSCULAR | Status: DC
  Administered 2022-11-30 – 2022-12-01 (×2): 40 mg via SUBCUTANEOUS
  Filled 2022-11-30 (×2): qty 0.4

## 2022-11-30 MED ORDER — INSULIN ASPART 100 UNIT/ML IJ SOLN
0.0000 [IU] | Freq: Every day | INTRAMUSCULAR | Status: DC
  Administered 2022-11-30: 3 [IU] via SUBCUTANEOUS

## 2022-11-30 MED ORDER — PREDNISONE 20 MG PO TABS: 40.0000 mg | ORAL_TABLET | Freq: Every day | ORAL | Status: DC

## 2022-11-30 MED ORDER — MAGNESIUM SULFATE 2 GM/50ML IV SOLN
2.0000 g | Freq: Once | INTRAVENOUS | Status: AC
  Administered 2022-11-30: 2 g via INTRAVENOUS
  Filled 2022-11-30: qty 50

## 2022-11-30 MED ORDER — SODIUM CHLORIDE 0.9 % IV SOLN
100.0000 mg | Freq: Once | INTRAVENOUS | Status: AC
  Administered 2022-11-30: 100 mg via INTRAVENOUS
  Filled 2022-11-30: qty 100

## 2022-11-30 MED ORDER — INSULIN ASPART 100 UNIT/ML IJ SOLN: 8.0000 [IU] | Freq: Three times a day (TID) | INTRAMUSCULAR | Status: DC

## 2022-11-30 MED ORDER — IPRATROPIUM-ALBUTEROL 0.5-2.5 (3) MG/3ML IN SOLN
3.0000 mL | RESPIRATORY_TRACT | Status: DC | PRN
  Administered 2022-11-30: 3 mL via RESPIRATORY_TRACT
  Filled 2022-11-30: qty 3

## 2022-11-30 MED ORDER — ACETAMINOPHEN 325 MG PO TABS
650.0000 mg | ORAL_TABLET | Freq: Four times a day (QID) | ORAL | Status: DC | PRN
  Administered 2022-11-30: 650 mg via ORAL
  Filled 2022-11-30: qty 2

## 2022-11-30 MED ORDER — INSULIN GLARGINE-YFGN 100 UNIT/ML ~~LOC~~ SOLN
10.0000 [IU] | Freq: Every day | SUBCUTANEOUS | Status: DC
  Administered 2022-11-30: 10 [IU] via SUBCUTANEOUS
  Filled 2022-11-30 (×4): qty 0.1

## 2022-11-30 MED ORDER — INSULIN ASPART 100 UNIT/ML IJ SOLN: 6.0000 [IU] | Freq: Three times a day (TID) | INTRAMUSCULAR | Status: DC

## 2022-11-30 MED ORDER — POLYETHYLENE GLYCOL 3350 17 G PO PACK
17.0000 g | PACK | Freq: Every day | ORAL | Status: DC | PRN
  Administered 2022-11-30: 17 g via ORAL
  Filled 2022-11-30 (×2): qty 1

## 2022-11-30 NOTE — Progress Notes (Signed)
Pharmacy Antibiotic Note  Courtney Bennett is a 57 y.o. female admitted on 11/29/2022 with  N/V and abdominal pain .  Pharmacy has been consulted for Unasyn dosing for possible aspiration pneumonia. WBC WNL. Renal function good.   Plan: Unasyn 3g IV q8h  Height: 5\' 3"  (160 cm) Weight: 47.8 kg (105 lb 6.1 oz) IBW/kg (Calculated) : 52.4  Temp (24hrs), Avg:97.9 F (36.6 C), Min:97.5 F (36.4 C), Max:98.2 F (36.8 C)  Recent Labs  Lab 11/29/22 1857  WBC 7.9  CREATININE 0.83    Estimated Creatinine Clearance: 57.1 mL/min (by C-G formula based on SCr of 0.83 mg/dL).    Allergies  Allergen Reactions   Metformin Diarrhea   Elemental Sulfur Hives, Rash and Other (See Comments)    "Burns the skin," also   Lamictal [Lamotrigine] Rash   Latex Itching and Rash   Sulfa Antibiotics Rash   Tape Rash and Other (See Comments)    Irritates the skin    Abran Duke, PharmD, BCPS Clinical Pharmacist Phone: [phone]

## 2022-11-30 NOTE — Inpatient Diabetes Management (Signed)
Inpatient Diabetes Program Recommendations  AACE/ADA: New Consensus Statement on Inpatient Glycemic Control (2015)  Target Ranges:  Prepandial:   less than 140 mg/dL      Peak postprandial:   less than 180 mg/dL (1-2 hours)      Critically ill patients:  140 - 180 mg/dL   Lab Results  Component Value Date   GLUCAP 379 (H) 11/30/2022   HGBA1C 15.2 (H) 12/12/2019    Review of Glycemic Control  Latest Reference Range & Units 11/30/22 03:09 11/30/22 07:57  Glucose-Capillary 70 - 99 mg/dL 562 (H) 130 (H)  (H): Data is abnormally high Diabetes history: T1DM Outpatient Diabetes medications: Farxiga 10 mg every day, Levemir 12 units at bedtime, Novolog 8 units TID Current orders for Inpatient glycemic control: Semglee 10 units at bedtime, Novolog 0-15 units TID Prednisone 40 mg every day Solumedrol 125 mg x 1  Inpatient Diabetes Program Recommendations:    Consider adding: -Novolog 6 units TID (Assuming patient is consuming >50 % of meals) -carb modified diet if appropriate - A1C?  -Change Semglee to now  Thanks, Lujean Rave, MSN, RNC-OB Diabetes Coordinator [phone] (8a-5p)

## 2022-11-30 NOTE — TOC Initial Note (Signed)
Transition of Care [facility]) - Initial/Assessment Note    Patient Details  Name: Courtney Bennett MRN: [id number] Date of Birth: [birth year]  Transition of Care [facility]) CM/SW Contact:    Janae Bridgeman, RN Phone Number: 11/30/2022, 1:47 PM  Clinical Narrative:                 CM met with the patient at the bedside to discuss TOC needs.  The patient is currently homeless and has been living in her car since August 2023.  The patient states that she has been speaking with [facility] and hopes to receive a [employer].  The patient was provided with contact number to [facility] to follow up - [phone].  I called and spoke with [facility] and the voucher program does not provide assurance for safe housing for the patient for discharge reasons for the hospital.  I explained this to the patient and the patient will need to follow up with the [facility] and shelter list provided as a backup plan to the hotel voucher.  The [facility] is open 22 hours per day and will provide safe shelter for the patient. Patient is updated and aware.  The patient has Dexcom G6 Glucometer system and has it presently at the bedside.  The patient was agreeable to be set up with PCP - Appointment made with [facility] and [facility] on December 14, 2022 at 830 am.  The patient receives a disability check for 1110.00 per month and food stamps for 224.00 per month.  CM will continue to follow the patient for discharge needs.  Patient will need to discharge to [facility] once medically stable for discharge - since patient is currently waiting on Motel voucher program.  Expected Discharge Plan: Home/Self Care Barriers to Discharge: Continued Medical Work up   Patient Goals and CMS Choice Patient states their goals for this hospitalization and ongoing recovery are:: to get better CMS Medicare.gov Compare Post Acute Care list provided to:: Patient Choice offered to / list  presented to : Patient [place] ownership interest in [facility].provided to:: Patient    Expected Discharge Plan and Services   Discharge Planning Services: CM Consult Post Acute Care Choice: Resumption of Svcs/PTA Provider Living arrangements for the past 2 months: Homeless                                      Prior Living Arrangements/Services Living arrangements for the past 2 months: Homeless Lives with:: Self Patient language and need for interpreter reviewed:: Yes Do you feel safe going back to the place where you live?: Yes      Need for Family Participation in Patient Care: Yes (Comment) Care giver support system in place?: Yes (comment) Current home services: DME (Has G6 Glucometer) Criminal Activity/Legal Involvement Pertinent to Current Situation/Hospitalization: No - Comment as needed  Activities of Daily Living Home Assistive Devices/Equipment: None ADL Screening (condition at time of admission) Patient's cognitive ability adequate to safely complete daily activities?: Yes Is the patient deaf or have difficulty hearing?: No Does the patient have difficulty seeing, even when wearing glasses/contacts?: No Does the patient have difficulty concentrating, remembering, or making decisions?: No Patient able to express need for assistance with ADLs?: Yes Does the patient have difficulty dressing or bathing?: Yes Independently performs ADLs?: Yes (appropriate for developmental age) Does the patient have difficulty walking or  climbing stairs?: No Weakness of Legs: Both Weakness of Arms/Hands: Both  Permission Sought/Granted Permission sought to share information with : Case Manager, Photographer granted to share info w AGENCY: [facility] - [phone]        Emotional Assessment Appearance:: Appears stated age Attitude/Demeanor/Rapport: Gracious Affect (typically observed):  Accepting Orientation: : Oriented to Self, Oriented to Place, Oriented to  Time, Oriented to Situation Alcohol / Substance Use: Not Applicable Psych Involvement: No (comment)  Admission diagnosis:  Respiratory failure (HCC) [J96.90] Hyperglycemia [R73.9] Acute respiratory failure with hypoxia (HCC) [J96.01] Patient Active Problem List   Diagnosis Date Noted   T1DM (type 1 diabetes mellitus) (HCC) 11/30/2022   Gastric distention 11/30/2022   Hav (hallux abducto valgus), unspecified laterality 11/04/2022   COPD (chronic obstructive pulmonary disease) (HCC) 01/22/2022   Homeless 01/22/2022   Tobacco dependence 12/27/2019   Aspiration pneumonia (HCC) 12/10/2019   Paroxysmal atrial flutter (HCC) 12/10/2019   Fibromyalgia 02/27/2019   Bipolar affective disorder, current episode hypomanic (HCC) 08/10/2014   Paranoid (HCC) 08/10/2014   PCP:  Patient, No Pcp Per Pharmacy:   [id number] - [place], [place] - [address], [address] AT [facility] [address], [address] [place] [address] Phone: [phone] Fax: [phone]     Social Determinants of Health (SDOH) Social History: SDOH Screenings   Food Insecurity: Food Insecurity Present (11/30/2022)  Housing: High Risk (11/30/2022)  Transportation Needs: Unmet Transportation Needs (11/30/2022)  Utilities: Patient Declined (11/30/2022)  Depression (PHQ2-9): Medium Risk (06/16/2019)  Tobacco Use: Medium Risk (11/29/2022)   SDOH Interventions:     Readmission Risk Interventions     No data to display

## 2022-11-30 NOTE — Progress Notes (Signed)
Subjective: Patient states she is doing much better today.  She is off oxygen and doing well.  Denies any shortness of breath, cough.  States that she has acid reflux, but no more than usual.  She had 1 episode of emesis prior to admission, but has not been throwing up regularly.  Her social situation is complicated as she was without housing and has been living out of her car for a while.  She lives in [place] but does not have a PCP, pulmonologist, or GI doctor.  She says she does have an endocrinologist that helps manage her type 1 diabetes, but notes that that it is difficult to keep up with her insulin regimen given her social situation.  She states she came to the hospital to cool off since she lives in a car with no air conditioning, and also has been having difficulty with her insulin recently.  Objective:  Vital signs in last 24 hours: Vitals:   11/30/22 0253 11/30/22 0254 11/30/22 0736 11/30/22 0757  BP: (!) 145/73  120/64   Pulse: 76  72 79  Resp: 18  18 17   Temp: (!) 97.5 F (36.4 C)  (!) 97.5 F (36.4 C)   TempSrc: Oral     SpO2: 91%  (!) 86% 92%  Weight:  47.8 kg    Height:  5\' 3"  (1.6 m)     Physical Exam Constitutional:      Comments: cachectic  HENT:     Nose: Nose normal.  Cardiovascular:     Rate and Rhythm: Normal rate and regular rhythm.     Pulses: Normal pulses.     Heart sounds: Normal heart sounds.  Pulmonary:     Effort: Pulmonary effort is normal.     Breath sounds: Normal breath sounds.  Skin:    General: Skin is warm and dry.     Comments: Noted circular scabs across chest and back  Neurological:     General: No focal deficit present.     Mental Status: She is alert and oriented to person, place, and time.     Labs:  CBC: Reviewed, unremarkable BMP: Glucose 400, otherwise unremarkable HIV Screen, 4th Generation: Nonreactive Urinalysis: Glucose over 500, no signs of UTI  Assessment/Plan:  Principal Problem:   Aspiration pneumonia  (HCC) Active Problems:   Bipolar affective disorder, current episode hypomanic (HCC)   COPD (chronic obstructive pulmonary disease) (HCC)   Homeless   T1DM (type 1 diabetes mellitus) (HCC)   Gastric distention  Patient Summary: [name] is a 57 year old female with a past medical history of type 1 diabetes, bipolar disorder, and COPD who presented with nausea/vomiting/abdominal pain and was subsequently found to be hypoxic at 85%, possibly due to aspiration pneumonia in the setting of COPD.  Acute hypoxia, possibly secondary to aspiration pneumonia COPD Patient had gastric distention and vomiting yesterday and was subsequently found to be satting at 85% in the emergency department.  Her baseline oxygen status is likely lower due to COPD.  CT of the chest demonstrated new right middle lobe collapse.  No abnormal breath sounds on exam, no change in sputum quantity or quality, therefore COPD exacerbation less likely. -Start Incruse Ellipta (equivalent of spireva) -Continue DuoNebs q4h PRN -Discontinue prednisone -Will do ambulatory pulse oximetry today -Augmentin 875-125 mg twice daily for possible aspiration pneumonia -Recommend outpatient follow-up with pulmonology  Gastric distention GERD Patient reports a history of GERD managed by lifestyle changes.  Some gastric distention was noted on  CT and was present at the time of admission.  She says the symptoms are improved from yesterday and have returned to their baseline.  Given her poorly controlled diabetes, she is at risk for gastroparesis, which could be contributing to the symptoms.  She is able to tolerate food and drink this morning. -Start Protonix 40 mg daily -Patient is aware of trigger foods; recommend continuing to avoid those when possible.  Type 1 diabetes, poorly controlled She notes that she has difficulty with her insulin regimen given her housing situation.  She was previously admitted for DKA.  Hemoglobin A1c is  pending.  She says she has an endocrinologist locally who helps manage this. -Continue Semglee 10 units at bedtime and NovoLog 6 units 3 times daily -Continue to monitor glucose -Follow-up with endocrinology outpatient  Negative SDOH, Unhoused Patient has been living in her car with no air conditioner for a while, which has complicated her ability to manage her diabetes.  She is on disability, but it is not enough to cover her basic needs.  She denies having any family or support locally to help her. She previously lived in a house in [place], but said the conditions were terrible and she was evicted.  She had negative experiences with [facility], and and does not have any caseworker or Child psychotherapist currently helping her. -Will consult TOC for assistance  Diet: Normal VTE: Enoxaparin IVF: None Code: Full   Prior to Admission Living Arrangement: Homeless Anticipated Discharge Location: Homeless Barriers to Discharge: Medical Management  Annett Fabian, MD 11/30/2022, 12:08 PM Pager: [phone] After 5pm on weekdays and 1pm on weekends: On Call pager [phone]

## 2022-11-30 NOTE — ED Provider Notes (Signed)
Care assumed from the triplicate.  Patient with a history of diabetes, hypertension, homelessness here with elevated blood sugar, nausea, vomiting, left-sided chest pain and hypoxia.  Has been living in her car and not had her medications for several weeks.  Found to be hypoxic on room air.  Wheezing on exam.  Workup is consistent with hyperglycemia without DKA.  Hypoxic to 85% on room air.  CT scan is pending to rule out pulmonary embolism.  CT negative for pulmonary embolism. Does show evidence of coronary atherosclerosis, COPD with right middle lobe debris, pulmonary nodules.  Will need admission for ongoing hypoxia likely in setting of COPD exacerbation.  She is given additional nebulizers, steroids and empiric antibiotics.  D/w internal medicine residents.    Glynn Octave, MD 11/30/22 [phone]

## 2022-11-30 NOTE — ED Notes (Signed)
Pt continues to remove her nasal cannula. Pt educated multiple times on need for oxygen.

## 2022-11-30 NOTE — ED Notes (Signed)
ED TO INPATIENT HANDOFF REPORT  ED Nurse Name and Phone #: [phone]  S Name/Age/Gender Courtney Bennett 57 y.o. female Room/Bed: 024C/024C  Code Status   Code Status: Full Code  Home/SNF/Other homeless Patient oriented to: self, place, time, and situation Is this baseline? Yes   Triage Complete: Triage complete  Chief Complaint Respiratory failure [facility]) [J96.90]  Triage Note Pt BIB [facility] from [employer] [facility]. Pt CBG 323, c/o of nausea/vomiting and acid reflux. Reports nausea has been nauseous for several days but vomiting just started today. Reporting  chest pain and left sided abdominal pain.   BP 150/78 HR 90 CBG 323 93 % room air   Allergies Allergies  Allergen Reactions   Metformin Diarrhea   Elemental Sulfur Hives, Rash and Other (See Comments)    "Burns the skin," also   Lamictal [Lamotrigine] Rash   Latex Itching and Rash   Sulfa Antibiotics Rash   Tape Rash and Other (See Comments)    Irritates the skin    Level of Care/Admitting Diagnosis ED Disposition     ED Disposition  Admit   Condition  --   Comment  Hospital Area: [facility] [[id number]]  Level of Care: Med-Surg [16]  May place patient in observation at [facility] or [facility] if equivalent level of care is available:: Yes  Covid Evaluation: Confirmed COVID Negative  Diagnosis: Respiratory failure [facility]) [[id number]]  Admitting Physician: Tyson Alias [phone]  Attending Physician: Tyson Alias [phone]          B Medical/Surgery History Past Medical History:  Diagnosis Date   Bipolar 1 disorder (HCC)    Diabetes mellitus    Fibromyalgia    Hypertension    Past Surgical History:  Procedure Laterality Date   ABDOMINAL HYSTERECTOMY  01/2019   BREAST BIOPSY Right    CESAREAN SECTION     HERNIA REPAIR       A IV Location/Drains/Wounds Patient Lines/Drains/Airways Status     Active Line/Drains/Airways     Name Placement date  Placement time Site Days   Peripheral IV 11/29/22 20 G Right Forearm 11/29/22  1900  Forearm  1   Pressure Injury 12/11/19 Sacrum Upper;Right Deep Tissue Pressure Injury - Purple or maroon localized area of discolored intact skin or blood-filled blister due to damage of underlying soft tissue from pressure and/or shear. 12/11/19  0607  -- 1085            Intake/Output Last 24 hours No intake or output data in the 24 hours ending 11/30/22 0141  Labs/Imaging Results for orders placed or performed during the hospital encounter of 11/29/22 (from the past 48 hour(s))  CBG monitoring, ED     Status: Abnormal   Collection Time: 11/29/22  6:48 PM  Result Value Ref Range   Glucose-Capillary 298 (H) 70 - 99 mg/dL    Comment: Glucose reference range applies only to samples taken after fasting for at least 8 hours.  CBC with Differential/Platelet     Status: None   Collection Time: 11/29/22  6:57 PM  Result Value Ref Range   WBC 7.9 4.0 - 10.5 K/uL   RBC 4.58 3.87 - 5.11 MIL/uL   Hemoglobin 14.1 12.0 - 15.0 g/dL   HCT 29.5 62.1 - 30.8 %   MCV 96.7 80.0 - 100.0 fL   MCH 30.8 26.0 - 34.0 pg   MCHC 31.8 30.0 - 36.0 g/dL   RDW 65.7 84.6 - 96.2 %  Platelets 240 150 - 400 K/uL   nRBC 0.0 0.0 - 0.2 %   Neutrophils Relative % 66 %   Neutro Abs 5.3 1.7 - 7.7 K/uL   Lymphocytes Relative 21 %   Lymphs Abs 1.7 0.7 - 4.0 K/uL   Monocytes Relative 10 %   Monocytes Absolute 0.8 0.1 - 1.0 K/uL   Eosinophils Relative 2 %   Eosinophils Absolute 0.2 0.0 - 0.5 K/uL   Basophils Relative 1 %   Basophils Absolute 0.1 0.0 - 0.1 K/uL   Immature Granulocytes 0 %   Abs Immature Granulocytes 0.02 0.00 - 0.07 K/uL    Comment: Performed at [facility], [address], [place], [place] [address]  Comprehensive metabolic panel     Status: Abnormal   Collection Time: 11/29/22  6:57 PM  Result Value Ref Range   Sodium 137 135 - 145 mmol/L   Potassium 4.2 3.5 - 5.1 mmol/L   Chloride 101 98 - 111 mmol/L    CO2 28 22 - 32 mmol/L   Glucose, Bld 311 (H) 70 - 99 mg/dL    Comment: Glucose reference range applies only to samples taken after fasting for at least 8 hours.   BUN 19 6 - 20 mg/dL   Creatinine, Ser 6.04 0.44 - 1.00 mg/dL   Calcium 8.6 (L) 8.9 - 10.3 mg/dL   Total Protein 5.4 (L) 6.5 - 8.1 g/dL   Albumin 3.0 (L) 3.5 - 5.0 g/dL   AST 14 (L) 15 - 41 U/L   ALT 11 0 - 44 U/L   Alkaline Phosphatase 61 38 - 126 U/L   Total Bilirubin 0.4 0.3 - 1.2 mg/dL   GFR, Estimated >54 >09 mL/min    Comment: (NOTE) Calculated using the CKD-EPI Creatinine Equation (2021)    Anion gap 8 5 - 15    Comment: Performed at [facility], [address] [place], [place] [address]  Troponin I (High Sensitivity)     Status: Abnormal   Collection Time: 11/29/22  6:57 PM  Result Value Ref Range   Troponin I (High Sensitivity) 27 (H) <18 ng/L    Comment: (NOTE) Elevated high sensitivity troponin I (hsTnI) values and significant  changes across serial measurements may suggest ACS but many other  chronic and acute conditions are known to elevate hsTnI results.  Refer to the "Links" section for chest pain algorithms and additional  guidance. Performed at [facility], [address], [place], [place] [address]   Lipase, blood     Status: None   Collection Time: 11/29/22  6:57 PM  Result Value Ref Range   Lipase 33 11 - 51 U/L    Comment: Performed at [facility], [address] [place], [place] [address]  Urinalysis, w/ Reflex to Culture (Infection Suspected) -Urine, Clean Catch     Status: Abnormal   Collection Time: 11/29/22  7:31 PM  Result Value Ref Range   Specimen Source URINE, CLEAN CATCH    Color, Urine YELLOW YELLOW   APPearance HAZY (A) CLEAR   Specific Gravity, Urine 1.020 1.005 - 1.030   pH 7.0 5.0 - 8.0   Glucose, UA >=500 (A) NEGATIVE mg/dL   Hgb urine dipstick NEGATIVE NEGATIVE   Bilirubin Urine NEGATIVE NEGATIVE   Ketones, ur NEGATIVE NEGATIVE mg/dL   Protein, ur  NEGATIVE NEGATIVE mg/dL   Nitrite NEGATIVE NEGATIVE   Leukocytes,Ua NEGATIVE NEGATIVE   RBC / HPF 0-5 0 - 5 RBC/hpf  WBC, UA 0-5 0 - 5 WBC/hpf    Comment:        Reflex urine culture not performed if WBC <=10, OR if Squamous epithelial cells >5. If Squamous epithelial cells >5 suggest recollection.    Bacteria, UA RARE (A) NONE SEEN   Squamous Epithelial / HPF 0-5 0 - 5 /HPF   Mucus PRESENT     Comment: Performed at [facility], [address] [place], [place] [address]  I-Stat venous blood gas, ED     Status: Abnormal   Collection Time: 11/29/22  8:26 PM  Result Value Ref Range   pH, Ven 7.505 (H) 7.25 - 7.43   pCO2, Ven 38.2 (L) 44 - 60 mmHg   pO2, Ven 120 (H) 32 - 45 mmHg   Bicarbonate 30.1 (H) 20.0 - 28.0 mmol/L   TCO2 31 22 - 32 mmol/L   O2 Saturation 99 %   Acid-Base Excess 7.0 (H) 0.0 - 2.0 mmol/L   Sodium 137 135 - 145 mmol/L   Potassium 4.2 3.5 - 5.1 mmol/L   Calcium, Ion 1.03 (L) 1.15 - 1.40 mmol/L   HCT 42.0 36.0 - 46.0 %   Hemoglobin 14.3 12.0 - 15.0 g/dL   Sample type VENOUS    CT Angio Chest PE W and/or Wo Contrast  Result Date: 11/29/2022 CLINICAL DATA:  Pulmonary embolism suspected, high probability. Reports chest and left-sided abdominal pain, nausea, vomiting. EXAM: CT ANGIOGRAPHY CHEST CT ABDOMEN AND PELVIS WITH CONTRAST TECHNIQUE: Multidetector CT imaging of the chest was performed using the standard protocol during bolus administration of intravenous contrast. Multiplanar CT image reconstructions and MIPs were obtained to evaluate the vascular anatomy. Multidetector CT imaging of the abdomen and pelvis was performed using the standard protocol during bolus administration of intravenous contrast. RADIATION DOSE REDUCTION: This exam was performed according to the departmental dose-optimization program which includes automated exposure control, adjustment of the mA and/or kV according to patient size and/or use of iterative reconstruction technique.  CONTRAST:  75mL OMNIPAQUE IOHEXOL 350 MG/ML SOLN COMPARISON:  Portable chest today, portable chest 11/16/2022, portable chest 12/06/2019, chest CT without contrast 11/10/2021, abdomen and pelvis CT with contrast 09/22/2022, abdomen and pelvis CT without contrast 12/09/2019. FINDINGS: CTA CHEST FINDINGS Cardiovascular: Pulmonary arteries are normal in caliber and do not show evidence of emboli. The cardiac size is normal. There are patchy calcifications in the left main and LAD coronary arteries and trace calcification in the right coronary artery. There is no pericardial effusion. No venous dilatation. Mild scattered atherosclerosis noted aorta and great vessels. No aortic or great vessel aneurysm, flow-limiting stenosis or dissection. Mediastinum/Nodes: The trachea and main bronchi are clear. A borderline prominent right hilar lymph node is noted just inferior to the bronchus intermedius measuring 9 mm on 5:78, but no enlarged intrathoracic lymph nodes are seen. There are a few similar sized borderline prominent left axillary lymph nodes. The right axilla is obscured by spray artifact from dense contrast in the right axillosubclavian veins. The spray artifact also largely obscures the thyroid. Thyroid enlargement with multiple nodules and dystrophic calcifications again is noted. Largest individual nodule has been previously biopsied in the thyroid isthmus inferiorly and measures 4-5 cm. No obvious interval changes in the thyroid are seen. The most recent ultrasound report of 10/17/2021 indicated no other nodules requiring follow-up or biopsy. There is a small hiatal hernia, mucosal fold thickening noted in the EG junction increased over prior studies. Consider endoscopic follow-up. Lungs/Pleura: There is no pleural effusion,  thickening or pneumothorax. The lungs are mildly emphysematous with centrilobular changes predominating and additional paraseptal change in the upper apices. Diffuse bronchial thickening is  noted but is improved compared with 11/10/2021. There is interval new finding of collapse of the right middle lobe. The bronchus intermedius is clear but there does appear to be scattered debris partially filling the medial and lateral right middle lobe segmental bronchi. Downstream there is no aeration of the smaller-order bronchi. There is also no evidence of obstructing mass. There previously were widespread, predominantly peribronchovascular micronodular tree-in-bud interstitial changes. In the upper and lower lobes there were also scattered areas of ground-glass disease some with cavitation. Posteriorly in the left upper lobe apex there is an 8 mm nodule which is unchanged over the previous exam, with adjacent mild coarse interstitial scar-like opacity and is probably a nodular scar but warranting a follow-up study. The other numerous small, presumably inflammatory nodules noted on the prior study have cleared since. There are occasional small scattered areas of cavitation in the location of some of the prior ground-glass opacities but no cavity seen larger than about 5 mm. The rest of the lungs are clear. Musculoskeletal: There are mild degenerative changes of the spine. No acute osseous abnormality or aggressive bone lesion is seen. Review of the MIP images confirms the above findings. CT ABDOMEN and PELVIS FINDINGS Hepatobiliary: 21 cm in length liver with homogeneous enhancement. Unremarkable gallbladder, bile ducts allowing for gallbladder contraction. Pancreas: No abnormality. Spleen: No abnormality or splenomegaly. Adrenals/Urinary Tract: No abnormality. Stomach/Bowel: Mild dilatation of the stomach with fluid and food products. The fissural mild dilatation of the descending duodenal. There is a somewhat shallow aortomesenteric angle, and shortening of the distance between the aorta and SMA to 7 mm. This could be due to SMA compression although was not seen on the last CT. There are thickened folds in  the jejunum, increased. Fluid filling of normal caliber lower abdominal small bowel with mucosal enhancement. Findings consistent with nonspecific enteritis. The appendix is normal caliber. There is moderate retained stool to the distal descending colon. Advanced sigmoid diverticulosis without appreciable inflammatory change. Vascular/Lymphatic: Aortic atherosclerosis. No enlarged abdominal or pelvic lymph nodes. Reproductive: Status post hysterectomy. No adnexal masses. Other: Generalized body wall edema continues to be seen. Diffuse hazy appearance in the mesentery. Findings may be due to malnutrition/hepatic dysfunction. Otherwise could be congestive but was seen on the last CT and not significantly changed. There is noted interval weight loss since 2021. There is minimal deep pelvic ascites but no drainable collection. There is no free hemorrhage, free air or abscess. Musculoskeletal: No acute or significant osseous findings. Review of the MIP images confirms the above findings. IMPRESSION: 1. No evidence of arterial dilatation or embolus. 2. Aortic and coronary artery atherosclerosis. No aortic aneurysm or dissection. 3. COPD with interval new right middle lobe collapse, scattered debris in the medial and lateral right middle lobe segmental bronchi. No obstructing mass is seen. 4. Interval clearing of previous finding of bilateral nodules and ground-glass opacities, with occasional small subcentimeter areas of postinflammatory cavitation. 5. 8 mm nodule in the left upper lobe is unchanged since 1 year ago. A follow-up study is recommended 18-24 months from the date of that study. 6. Small hiatal hernia with mucosal fold thickening in the EG junction, increased over prior studies. Consider endoscopic follow-up. 7. Increased gastric dilatation with possible SMA compression. 8. CT findings of nonspecific enteritis. 9. Constipation, with sigmoid diverticulosis. 10. Diffuse body wall edema and diffuse hazy  appearance in the mesentery. Findings may be due to malnutrition/hepatic dysfunction. Otherwise could be congestive but was also seen on the last CT and not significantly changed. Electronically Signed   By: Almira Bar M.D.   On: 11/29/2022 23:54   CT ABDOMEN PELVIS W CONTRAST  Result Date: 11/29/2022 CLINICAL DATA:  Pulmonary embolism suspected, high probability. Reports chest and left-sided abdominal pain, nausea, vomiting. EXAM: CT ANGIOGRAPHY CHEST CT ABDOMEN AND PELVIS WITH CONTRAST TECHNIQUE: Multidetector CT imaging of the chest was performed using the standard protocol during bolus administration of intravenous contrast. Multiplanar CT image reconstructions and MIPs were obtained to evaluate the vascular anatomy. Multidetector CT imaging of the abdomen and pelvis was performed using the standard protocol during bolus administration of intravenous contrast. RADIATION DOSE REDUCTION: This exam was performed according to the departmental dose-optimization program which includes automated exposure control, adjustment of the mA and/or kV according to patient size and/or use of iterative reconstruction technique. CONTRAST:  75mL OMNIPAQUE IOHEXOL 350 MG/ML SOLN COMPARISON:  Portable chest today, portable chest 11/16/2022, portable chest 12/06/2019, chest CT without contrast 11/10/2021, abdomen and pelvis CT with contrast 09/22/2022, abdomen and pelvis CT without contrast 12/09/2019. FINDINGS: CTA CHEST FINDINGS Cardiovascular: Pulmonary arteries are normal in caliber and do not show evidence of emboli. The cardiac size is normal. There are patchy calcifications in the left main and LAD coronary arteries and trace calcification in the right coronary artery. There is no pericardial effusion. No venous dilatation. Mild scattered atherosclerosis noted aorta and great vessels. No aortic or great vessel aneurysm, flow-limiting stenosis or dissection. Mediastinum/Nodes: The trachea and main bronchi are clear. A  borderline prominent right hilar lymph node is noted just inferior to the bronchus intermedius measuring 9 mm on 5:78, but no enlarged intrathoracic lymph nodes are seen. There are a few similar sized borderline prominent left axillary lymph nodes. The right axilla is obscured by spray artifact from dense contrast in the right axillosubclavian veins. The spray artifact also largely obscures the thyroid. Thyroid enlargement with multiple nodules and dystrophic calcifications again is noted. Largest individual nodule has been previously biopsied in the thyroid isthmus inferiorly and measures 4-5 cm. No obvious interval changes in the thyroid are seen. The most recent ultrasound report of 10/17/2021 indicated no other nodules requiring follow-up or biopsy. There is a small hiatal hernia, mucosal fold thickening noted in the EG junction increased over prior studies. Consider endoscopic follow-up. Lungs/Pleura: There is no pleural effusion, thickening or pneumothorax. The lungs are mildly emphysematous with centrilobular changes predominating and additional paraseptal change in the upper apices. Diffuse bronchial thickening is noted but is improved compared with 11/10/2021. There is interval new finding of collapse of the right middle lobe. The bronchus intermedius is clear but there does appear to be scattered debris partially filling the medial and lateral right middle lobe segmental bronchi. Downstream there is no aeration of the smaller-order bronchi. There is also no evidence of obstructing mass. There previously were widespread, predominantly peribronchovascular micronodular tree-in-bud interstitial changes. In the upper and lower lobes there were also scattered areas of ground-glass disease some with cavitation. Posteriorly in the left upper lobe apex there is an 8 mm nodule which is unchanged over the previous exam, with adjacent mild coarse interstitial scar-like opacity and is probably a nodular scar but  warranting a follow-up study. The other numerous small, presumably inflammatory nodules noted on the prior study have cleared since. There are occasional small scattered areas of cavitation in the location of some  of the prior ground-glass opacities but no cavity seen larger than about 5 mm. The rest of the lungs are clear. Musculoskeletal: There are mild degenerative changes of the spine. No acute osseous abnormality or aggressive bone lesion is seen. Review of the MIP images confirms the above findings. CT ABDOMEN and PELVIS FINDINGS Hepatobiliary: 21 cm in length liver with homogeneous enhancement. Unremarkable gallbladder, bile ducts allowing for gallbladder contraction. Pancreas: No abnormality. Spleen: No abnormality or splenomegaly. Adrenals/Urinary Tract: No abnormality. Stomach/Bowel: Mild dilatation of the stomach with fluid and food products. The fissural mild dilatation of the descending duodenal. There is a somewhat shallow aortomesenteric angle, and shortening of the distance between the aorta and SMA to 7 mm. This could be due to SMA compression although was not seen on the last CT. There are thickened folds in the jejunum, increased. Fluid filling of normal caliber lower abdominal small bowel with mucosal enhancement. Findings consistent with nonspecific enteritis. The appendix is normal caliber. There is moderate retained stool to the distal descending colon. Advanced sigmoid diverticulosis without appreciable inflammatory change. Vascular/Lymphatic: Aortic atherosclerosis. No enlarged abdominal or pelvic lymph nodes. Reproductive: Status post hysterectomy. No adnexal masses. Other: Generalized body wall edema continues to be seen. Diffuse hazy appearance in the mesentery. Findings may be due to malnutrition/hepatic dysfunction. Otherwise could be congestive but was seen on the last CT and not significantly changed. There is noted interval weight loss since 2021. There is minimal deep pelvic ascites  but no drainable collection. There is no free hemorrhage, free air or abscess. Musculoskeletal: No acute or significant osseous findings. Review of the MIP images confirms the above findings. IMPRESSION: 1. No evidence of arterial dilatation or embolus. 2. Aortic and coronary artery atherosclerosis. No aortic aneurysm or dissection. 3. COPD with interval new right middle lobe collapse, scattered debris in the medial and lateral right middle lobe segmental bronchi. No obstructing mass is seen. 4. Interval clearing of previous finding of bilateral nodules and ground-glass opacities, with occasional small subcentimeter areas of postinflammatory cavitation. 5. 8 mm nodule in the left upper lobe is unchanged since 1 year ago. A follow-up study is recommended 18-24 months from the date of that study. 6. Small hiatal hernia with mucosal fold thickening in the EG junction, increased over prior studies. Consider endoscopic follow-up. 7. Increased gastric dilatation with possible SMA compression. 8. CT findings of nonspecific enteritis. 9. Constipation, with sigmoid diverticulosis. 10. Diffuse body wall edema and diffuse hazy appearance in the mesentery. Findings may be due to malnutrition/hepatic dysfunction. Otherwise could be congestive but was also seen on the last CT and not significantly changed. Electronically Signed   By: Almira Bar M.D.   On: 11/29/2022 23:54   DG Chest Port 1 View  Result Date: 11/29/2022 CLINICAL DATA:  Cough EXAM: PORTABLE CHEST 1 VIEW COMPARISON:  11/16/2022 FINDINGS: Cardiac shadow is within normal limits. Aortic calcifications are noted. Lungs are well aerated bilaterally. No focal infiltrate or effusion is seen. No bony abnormality is noted. IMPRESSION: No active disease. Electronically Signed   By: Alcide Clever M.D.   On: 11/29/2022 19:53    Pending Labs Unresulted Labs (From admission, onward)     Start     Ordered   11/30/22 0500  Basic metabolic panel  Tomorrow morning,   R         11/30/22 0125   11/30/22 0500  CBC  Tomorrow morning,   R        11/30/22 0125   11/30/22  0128  Hemoglobin A1c  Once,   R        11/30/22 0127   11/30/22 0124  HIV Antibody (routine testing w rflx)  (HIV Antibody (Routine testing w reflex) panel)  Once,   R        11/30/22 0125            Vitals/Pain Today's Vitals   11/30/22 0030 11/30/22 0033 11/30/22 0049 11/30/22 0100  BP: (!) 147/73   (!) 157/75  Pulse: 85 79 79 81  Resp: (!) 22 16 16 19   Temp:      TempSrc:      SpO2: (!) 89% 95% 96% 91%  PainSc:        Isolation Precautions No active isolations  Medications Medications  doxycycline (VIBRAMYCIN) 100 mg in sodium chloride 0.9 % 250 mL IVPB (100 mg Intravenous New Bag/Given 11/30/22 0107)  enoxaparin (LOVENOX) injection 40 mg (has no administration in time range)  lactated ringers bolus 1,000 mL (0 mLs Intravenous Stopped 11/29/22 2057)  ondansetron (ZOFRAN) injection 4 mg (4 mg Intravenous Given 11/29/22 1958)  ipratropium-albuterol (DUONEB) 0.5-2.5 (3) MG/3ML nebulizer solution 3 mL (3 mLs Nebulization Given 11/29/22 2057)  ondansetron (ZOFRAN) injection 4 mg (4 mg Intravenous Given 11/29/22 2256)  iohexol (OMNIPAQUE) 350 MG/ML injection 75 mL (75 mLs Intravenous Contrast Given 11/29/22 2305)  ipratropium-albuterol (DUONEB) 0.5-2.5 (3) MG/3ML nebulizer solution 3 mL (3 mLs Nebulization Given 11/30/22 0019)  methylPREDNISolone sodium succinate (SOLU-MEDROL) 125 mg/2 mL injection 125 mg (125 mg Intravenous Given 11/30/22 0015)  magnesium sulfate IVPB 2 g 50 mL (0 g Intravenous Stopped 11/30/22 0051)    Mobility walks     Focused Assessments Pulmonary Assessment Handoff:  Lung sounds:   O2 Device: Nasal Cannula O2 Flow Rate (L/min): 2 L/min    R Recommendations: See Admitting Provider Note  Report given to:   Additional Notes: pt homeless / not normally on O2 / currently on 2 lpm / uses bedside commode / a&o

## 2022-11-30 NOTE — Hospital Course (Addendum)
Nausea started today, felt really hot and vomited, thought she was having a heat stroke. Around 2:30 she had her vomiting episode. Living in car since last August. Vomited x2, not a lot volume wise. A lot of acid reflux, especially with tomato based pasta sauce. Chunks of food, orange-esque. Been living in the car, states nausea is due to heat and living in car. And felt hypoglycemic today, was shaking and sweating.   Abdominal pain: since she was a kid, could be related to constipation? Hernia surgery when she was a kid. PID? 14cm mass? Right ovarina neoplasm s/p hysterectomy. Pain on LLQ and goes up Left uQ. Happens after she eats. Sometimes stomach feels hot. Pain gets better after having a bowel movement.   No fevers, chills, new headaches,   Gastroperesis?   Bowel movements: Orangeish? Started recently. Last bowel movement today, orangey red color.   [facility] and thought she was having a heart attack  Chest Pain: GERD. Radiates across tehc ehst  Cigarrettes: half a pack to a pack off and on since 16. 2016 got sent home with oxygen.   Weight Loss: 140 before her surgery, 100, lowest has been is 78.   Meds:  - Novolog with meals 8U (Took 12-14U today)  - Lantus at night 12U - Intermittently losartan  -  Doxycycline started  - Aleve, Goody Powder  Social:  Lives in car  No family in the area, has some friends Normally independent in ADLs and IADLs Currently not working  Substances: NO use of illicit drugs, no smoking   7/1: States acid reflux bothering her but overall improving; does not have a PCP but endocrinologist in [place] taking care of meds; homeless since last August resulting in inadequate nutrition; yesterday first time vomiting and states it appeared bilious that only occurs after eating acidic food; came to hospital 2/2 heat exposure w/ no heat and difficulties w/ insulin; experiences w/ [facility] but wasn't helpful; does not have a Child psychotherapist or  Sports coach and hopes to discuss w/ social workers at [employer];

## 2022-11-30 NOTE — H&P (Addendum)
Date: 11/30/2022               Patient Name:  Courtney Bennett MRN: [id number]  DOB: 23-Jun-1965 Age / Sex: 57 y.o., female   PCP: Patient, No Pcp Per         Medical Service: Internal Medicine Teaching Service         Attending Physician: Dr. Oswaldo Done, Marquita Palms, *      First Contact: Dr. Annett Fabian, MD Pager [phone]    Second Contact: Dr. Elza Rafter, DO Pager [phone]         After Hours (After 5p/  First Contact Pager: [phone]  weekends / holidays): Second Contact Pager: [phone]   SUBJECTIVE   Chief Complaint: "Nausea, vomiting, dehydration, and abdominal pain"  History of Present Illness:   Patient is a 57 year old Female presenting to ED for nausea, vomiting, abdominal pain and unspecified chest pain. Patient reports she has been living in a car since last year of August. She reports it gets really hot in the car due to no air conditioning. She has been feeling dehydrated. She reports chronic nausea due to acid-reflux. Patient reports 2-3 episodes of vomiting today around 2:30 PM, non bloody, non bilious.Described it as small bouts with chunks of food. States she felt really hot in the car, felt like she was having a "heat stroke".Reports she felt hypoglycemic today after taking Lantus 12-14 units, states she was shaking and sweating. Patient reports chronic acid reflux worse with tomato based pasta sauce. Reports chronic abdominal pain mostly on the left lower and upper quadrants. Denies any current nausea, vomiting, or abdominal pain. Last bowel movement was this afternoon, stated it was orange color, denies any blood in bowel. Patient reports unspecified chest discomfort, attributes it to acid reflux. Patient denies any current cough or shortness of breath with activity.    ED Course: Patient is a 57 year old female presented to ED for nausea, vomiting, abdominal pain, and unspecified chest discomfort. She was found to be hypoxic at 85%. Labs showed elevated Gluc 311, CMP  and CBC within normal limits. UA negative for acute findings. She was given 1L LR, 2 doses of Zofran, and given DuoNebs. CT abdomen, pelvis noted COPD with new right middle lobe collapse, scattered debris in the medial and lateral right middle lobe segmental bronchi. Also notes increasing hiatal hernia and SMA compression from gastric dilatation. IMTS consulted for admission.   Meds:  Novolog with meals 8U  Lantus at night 12U Losartan  Doxycycline Aleve, Goody Powder prn  Past Medical History Type I diabetes with Hyperglycemia Hypertension Bipolar Disease  Past Surgical History:  Procedure Laterality Date   ABDOMINAL HYSTERECTOMY  01/2019   BREAST BIOPSY Right    CESAREAN SECTION     HERNIA REPAIR      Social:  Lives in: Car since last year August Occupation: Unemployed  Support: No family in the area, has some friends Level of Function: Normally independent in ADLs and IADLs PCP: [facility] Substances: Smokes 1/2 pack to a pack cigarettes since age of 42, drinks occasionally, no use of illicit substances   Family History:  Father - heart disease, bladder cancer, Diabetes Mother- lung cancer, breast cancer, and bipolar disorder  Allergies: Allergies as of 11/29/2022 - Review Complete 11/29/2022  Allergen Reaction Noted   Metformin Diarrhea 02/27/2019   Elemental sulfur Hives, Rash, and Other (See Comments) 04/05/2019   Lamictal [lamotrigine] Rash 08/08/2014   Latex Itching and Rash 11/01/2011   Sulfa antibiotics  Rash 11/07/2019   Tape Rash and Other (See Comments) 12/10/2019    Review of Systems: A complete ROS was negative except as per HPI.   OBJECTIVE:   Physical Exam: Blood pressure 138/71, pulse 78, temperature 98.2 F (36.8 C), resp. rate 19, last menstrual period 11/24/2014, SpO2 93 %.  Constitutional: cachetic, in no acute distress  HENT: normocephalic atraumatic, mucous membranes dry Cardiovascular: regular rate and rhythm, no m/r/g. No  lower extremity edema noted bilaterally. +2 dp and radial pulses.  Pulmonary/Chest: + Expiratory wheeze Right upper and left upper lobe, + decreased lung sounds in left lower lobe Abdominal: soft, non-tender, non-distended MSK: range of motion intact Neurological: alert & oriented x 3, 5/5 strength in bilateral upper and lower extremities,  Skin: warm and dry Psych: slightly anxious mood and effect  Labs: CBC    Component Value Date/Time   WBC 7.9 11/29/2022 1857   RBC 4.58 11/29/2022 1857   HGB 14.3 11/29/2022 2026   HGB 16.3 (H) 05/02/2019 1424   HCT 42.0 11/29/2022 2026   HCT 49.2 (H) 05/02/2019 1424   PLT 240 11/29/2022 1857   PLT 264 05/02/2019 1424   MCV 96.7 11/29/2022 1857   MCV 90 05/02/2019 1424   MCH 30.8 11/29/2022 1857   MCHC 31.8 11/29/2022 1857   RDW 13.1 11/29/2022 1857   RDW 13.2 05/02/2019 1424   LYMPHSABS 1.7 11/29/2022 1857   MONOABS 0.8 11/29/2022 1857   EOSABS 0.2 11/29/2022 1857   BASOSABS 0.1 11/29/2022 1857     CMP     Component Value Date/Time   NA 137 11/29/2022 2026   NA 135 05/02/2019 1424   K 4.2 11/29/2022 2026   CL 101 11/29/2022 1857   CO2 28 11/29/2022 1857   GLUCOSE 311 (H) 11/29/2022 1857   BUN 19 11/29/2022 1857   BUN 13 05/02/2019 1424   CREATININE 0.83 11/29/2022 1857   CALCIUM 8.6 (L) 11/29/2022 1857   PROT 5.4 (L) 11/29/2022 1857   PROT 7.0 05/02/2019 1424   ALBUMIN 3.0 (L) 11/29/2022 1857   ALBUMIN 4.5 05/02/2019 1424   AST 14 (L) 11/29/2022 1857   ALT 11 11/29/2022 1857   ALKPHOS 61 11/29/2022 1857   BILITOT 0.4 11/29/2022 1857   BILITOT 0.3 05/02/2019 1424   GFRNONAA >60 11/29/2022 1857   GFRAA >60 12/15/2019 0135    Imaging: CXR negative for active disease CT Abdomen pelvis and CT angio showed COPD with interval new right middle lobe collapse, scattered debris in the medial and lateral right middle lobe segmental bronchi. Interval clearing of previous finding of bilateral nodules and ground-glass opacities, with  occasional small subcentimeter areas of post inflammatory cavitation. Unchanged 8 mm nodule in the left upper lobe. Small hiatal hernia with mucosal fold thickening in the EG junction that has increased compared to the prior studies. Increased gastric dilatation with possible SMA compression. Constipation with sigmoid diverticulosis.   EKG: personally reviewed my interpretation is normal rate and sinus rhythm, no change compared to Prior EKG.  ASSESSMENT & PLAN:   Assessment & Plan by Problem:   Courtney Bennett is a 57 y.o. person living with a history of type I diabetes with hyperglycemia on Lantus and Novolog, hypertension, bipolar disease who presented with complains of nausea, vomiting,abdominal pain and unspecified chest discomfort and admitted for acute hypoxia on hospital day 0  Acute Hypoxic respiratory failure likely multifactorial   Presented to ED with new-onset hypoxia 85%, currently on 3L O2 via nasal cannula, currently denies  any shortness of breath, cough, or fevers. CT Abdomen pelvis showed new right middle lobe collapse, scattered debris in the medial and lateral right middle lobe segmental bronchi. In the upper and lower lobes, scattered areas of ground glass disease noted with some cavitation. Patient continues to smoke 1/2 pack to pack a day and currently not on any bronchodilators. Considering possible worsening COPD as patient continues to smoke, not on any bronchodilators, and image findings show mildly emphysematous with centrilobular changes and collapse of the right middle lobe. Also considering aspiration pneumonia as imaging showed scattered debris in the medial and lateral right middle lobe segmental bronchi, symptoms of multiple bouts of vomiting, and history of aspiration pneumonia that required intubation.     Plan: Continue DuoNebs q4 Start on Unasyn for aspiration pneumonia  Wean O2 as patient tolerates Will likely require bronchodilators upon discharge    Nausea Vomiting Metabolic Alkalosis Presented with symptoms of nausea and multiple bouts of non bloody, non bilious vomiting, described it as chunks of food. Reports chronic nausea due to acid-reflux and extreme weight loss. She also reports loss of appetite, early satiety likely due to gastroparesis. Imaging showed small hiatal hernia with mucosal fold thickening in the EG junction that has increased compared to previous studies. Also noted gastric dilatation with possible SMA compression. Her symptoms of nausea and new-onset vomiting is likely secondary to multifactorial etiologies such as worsening hiatal hernia vs. Likely SMA compression from extreme weight loss vs. Living conditions. Patient mentioned that it is hard for her to calculate insulin dose and she has poor access to food. She lives in a car without air conditioner. The weather has been hot lately, and she felt like she was having a "heat stroke" today. Currently, patient denies any nausea or vomiting.   Plan:  Monitor bowel movements Encourage PO intake  Nodule on CT scan  8mm nodule noted in the left upper lobe unchanged since last year Follow up study is recommended in 18 to 24 months   Type I diabetes with Hyperglycemia Patient with known history of Diabetes on Lantus and Novolog. She reports taking 12 to 14 unites of Novolog post-meal today; she felt extremely shaky and diaphoretic.   Plan: Will order Hgb A1C  Continue Lantus and sliding scale  Monitor glucose checks in the setting of prednisone  Hypertension Patient with history of hypertension, non-compliant with losartan  Plan: Consider starting losartan if patient becomes hypertensive  Diet: Normal VTE: Enoxaparin IVF: None,None Code: Full  Prior to Admission Living Arrangement: Homeless Anticipated Discharge Location: Homeless Barriers to Discharge: Medical Management  Dispo: Admit patient to Observation with expected length of stay less than 2  midnights.  Signed: Jeral Pinch, DO Internal Medicine Resident PGY-1  11/30/2022, 4:47 AM

## 2022-12-01 ENCOUNTER — Other Ambulatory Visit (HOSPITAL_COMMUNITY): Payer: Self-pay

## 2022-12-01 DIAGNOSIS — J440 Chronic obstructive pulmonary disease with acute lower respiratory infection: Secondary | ICD-10-CM | POA: Diagnosis not present

## 2022-12-01 DIAGNOSIS — E1110 Type 2 diabetes mellitus with ketoacidosis without coma: Secondary | ICD-10-CM | POA: Insufficient documentation

## 2022-12-01 DIAGNOSIS — E109 Type 1 diabetes mellitus without complications: Secondary | ICD-10-CM | POA: Diagnosis not present

## 2022-12-01 DIAGNOSIS — K219 Gastro-esophageal reflux disease without esophagitis: Secondary | ICD-10-CM | POA: Diagnosis not present

## 2022-12-01 DIAGNOSIS — J690 Pneumonitis due to inhalation of food and vomit: Secondary | ICD-10-CM | POA: Diagnosis not present

## 2022-12-01 DIAGNOSIS — E119 Type 2 diabetes mellitus without complications: Secondary | ICD-10-CM | POA: Insufficient documentation

## 2022-12-01 LAB — BASIC METABOLIC PANEL
Anion gap: 6 (ref 5–15)
BUN: 18 mg/dL (ref 6–20)
CO2: 27 mmol/L (ref 22–32)
Calcium: 8.3 mg/dL — ABNORMAL LOW (ref 8.9–10.3)
Chloride: 102 mmol/L (ref 98–111)
Creatinine, Ser: 0.51 mg/dL (ref 0.44–1.00)
GFR, Estimated: >60 mL/min (ref >60)
Glucose, Bld: 208 mg/dL — ABNORMAL HIGH (ref 70–99)
Potassium: 3.8 mmol/L (ref 3.5–5.1)
Sodium: 135 mmol/L (ref 135–145)

## 2022-12-01 LAB — CBC
HCT: 41.9 % (ref 36.0–46.0)
Hemoglobin: 13.4 g/dL (ref 12.0–15.0)
MCH: 30.9 pg (ref 26.0–34.0)
MCHC: 32 g/dL (ref 30.0–36.0)
MCV: 96.5 fL (ref 80.0–100.0)
Platelets: 215 10*3/uL (ref 150–400)
RBC: 4.34 MIL/uL (ref 3.87–5.11)
RDW: 12.8 % (ref 11.5–15.5)
WBC: 10.1 10*3/uL (ref 4.0–10.5)
nRBC: 0 % (ref 0.0–0.2)

## 2022-12-01 LAB — HEMOGLOBIN A1C
Hgb A1c MFr Bld: 12 % — ABNORMAL HIGH (ref 4.8–5.6)
Mean Plasma Glucose: 298 mg/dL

## 2022-12-01 LAB — GLUCOSE, CAPILLARY
Glucose-Capillary: 204 mg/dL — ABNORMAL HIGH (ref 70–99)
Glucose-Capillary: 129 mg/dL — ABNORMAL HIGH (ref 70–99)

## 2022-12-01 MED ORDER — AMOXICILLIN-POT CLAVULANATE 875-125 MG PO TABS
1.0000 | ORAL_TABLET | Freq: Two times a day (BID) | ORAL | 0 refills | Status: AC
  Filled 2022-12-01: qty 6, 3d supply, fill #0

## 2022-12-01 MED ORDER — ALBUTEROL SULFATE HFA 108 (90 BASE) MCG/ACT IN AERS
1.0000 | INHALATION_SPRAY | Freq: Four times a day (QID) | RESPIRATORY_TRACT | 0 refills | Status: DC | PRN
  Filled 2022-12-01: qty 18, 25d supply, fill #0

## 2022-12-01 MED ORDER — INSULIN GLARGINE-YFGN 100 UNIT/ML ~~LOC~~ SOLN
14.0000 [IU] | Freq: Every day | SUBCUTANEOUS | Status: DC
  Filled 2022-12-01: qty 0.14

## 2022-12-01 MED ORDER — INSULIN PEN NEEDLE 32G X 4 MM MISC
2.0000 | Freq: Two times a day (BID) | 0 refills | Status: DC
  Filled 2022-12-01: qty 200, 100d supply, fill #0

## 2022-12-01 MED ORDER — HUMALOG KWIKPEN 200 UNIT/ML ~~LOC~~ SOPN
8.0000 [IU] | PEN_INJECTOR | Freq: Three times a day (TID) | SUBCUTANEOUS | 0 refills | Status: DC
  Filled 2022-12-01: qty 6, 25d supply, fill #0

## 2022-12-01 MED ORDER — ACETAMINOPHEN 500 MG PO TABS: 1000.0000 mg | ORAL_TABLET | Freq: Four times a day (QID) | ORAL | Status: DC | PRN

## 2022-12-01 MED ORDER — SIMETHICONE 80 MG PO CHEW
80.0000 mg | CHEWABLE_TABLET | Freq: Once | ORAL | Status: AC
  Administered 2022-12-01: 80 mg via ORAL
  Filled 2022-12-01: qty 1

## 2022-12-01 MED ORDER — LANTUS SOLOSTAR 100 UNIT/ML ~~LOC~~ SOPN
14.0000 [IU] | PEN_INJECTOR | Freq: Every day | SUBCUTANEOUS | 0 refills | Status: DC
  Filled 2022-12-01: qty 15, 107d supply, fill #0

## 2022-12-01 MED ORDER — ACCU-CHEK AVIVA PLUS VI STRP
1.0000 | ORAL_STRIP | Freq: Every day | 0 refills | Status: DC
  Filled 2022-12-01: qty 100, 50d supply, fill #0

## 2022-12-01 MED ORDER — LOSARTAN POTASSIUM 50 MG PO TABS
50.0000 mg | ORAL_TABLET | Freq: Every day | ORAL | Status: DC
  Administered 2022-12-01: 50 mg via ORAL
  Filled 2022-12-01: qty 1

## 2022-12-01 MED ORDER — PANTOPRAZOLE SODIUM 40 MG PO TBEC
40.0000 mg | DELAYED_RELEASE_TABLET | Freq: Every day | ORAL | 0 refills | Status: DC
  Filled 2022-12-01: qty 30, 30d supply, fill #0

## 2022-12-01 MED ORDER — UMECLIDINIUM BROMIDE 62.5 MCG/ACT IN AEPB
1.0000 | INHALATION_SPRAY | Freq: Every day | RESPIRATORY_TRACT | 0 refills | Status: DC
  Filled 2022-12-01: qty 30, 30d supply, fill #0

## 2022-12-01 NOTE — Plan of Care (Signed)

## 2022-12-01 NOTE — Discharge Summary (Cosign Needed)
Name: Courtney Bennett MRN: [id number] DOB: February 28, 1966 57 y.o. PCP: Patient, No Pcp Per  Date of Admission: 11/29/2022  6:44 PM Date of Discharge:  12/01/2022 Attending Physician: Dr. Oswaldo Done  DISCHARGE DIAGNOSIS:  Primary Problem: Aspiration pneumonia [facility])   Hospital Problems: Principal Problem:   Aspiration pneumonia (HCC) Active Problems:   Bipolar affective disorder, current episode hypomanic (HCC)   COPD (chronic obstructive pulmonary disease) (HCC)   Homeless   T1DM (type 1 diabetes mellitus) (HCC)   Gastric distention    DISCHARGE MEDICATIONS:   Allergies as of 12/01/2022       Reactions   Metformin Diarrhea   Elemental Sulfur Hives, Rash, Other (See Comments)   "Burns the skin," also   Lamictal [lamotrigine] Rash   Latex Itching, Rash   Sulfa Antibiotics Rash   Tape Rash, Other (See Comments)   Irritates the skin        Medication List     STOP taking these medications    budesonide-formoterol 160-4.5 MCG/ACT inhaler Commonly known as: SYMBICORT   dicyclomine 20 MG tablet Commonly known as: BENTYL   doxycycline 100 MG tablet Commonly known as: VIBRA-TABS   Farxiga 10 MG Tabs tablet Generic drug: dapagliflozin propanediol   fluconazole 200 MG tablet Commonly known as: DIFLUCAN   griseofulvin 500 MG tablet Commonly known as: GRIFULVIN V   insulin detemir 100 UNIT/ML FlexPen Commonly known as: LEVEMIR   lisinopril 40 MG tablet Commonly known as: ZESTRIL   omeprazole 40 MG capsule Commonly known as: PRILOSEC   traMADol 50 MG tablet Commonly known as: ULTRAM   venlafaxine XR 75 MG 24 hr capsule Commonly known as: EFFEXOR-XR       TAKE these medications    Accu-Chek Aviva Plus test strip Generic drug: glucose blood 1 each by Other route daily. And lancets 2/day   albuterol 108 (90 Base) MCG/ACT inhaler Commonly known as: VENTOLIN HFA Inhale 1-2 puffs into the lungs every 6 (six) hours as needed. What changed:  how much to  take when to take this   amoxicillin-clavulanate 875-125 MG tablet Commonly known as: AUGMENTIN Take 1 tablet by mouth every 12 (twelve) hours for 3 days. Start taking on: December 02, 2022   atorvastatin 20 MG tablet Commonly known as: LIPITOR Take 1 tablet (20 mg total) by mouth daily.   blood glucose meter kit and supplies Dispense based on patient and insurance preference. Use up to four times daily as directed. (FOR ICD-10 E10.9, E11.9).   chlorhexidine 0.12 % solution Commonly known as: PERIDEX as directed.   [employer]S B-12 500 MCG tablet Generic drug: cyanocobalamin Take 500 mcg by mouth daily.   estrogens (conjugated) 0.3 MG tablet Commonly known as: Premarin Take 1 tablet (0.3 mg total) by mouth daily.   FreeStyle Libre 14 Day Sensor Misc 1 application by Subdermal route See admin instructions. Check blood glucose 1-2 times daily. E11.65   gabapentin 300 MG capsule Commonly known as: NEURONTIN Take 300 mg by mouth 2 (two) times daily.   HumaLOG KwikPen 200 UNIT/ML KwikPen Generic drug: insulin lispro Inject 8 Units into the skin 3 (three) times daily with meals. 8 units 3 times per day before meals + correctional insulin MDD 50 units per day Subcutaneous for 30 days What changed:  how much to take how to take this when to take this Another medication with the same name was removed. Continue taking this medication, and follow the directions you see here.   Insulin Pen Needle 32G X  4 MM Misc Use 2 times per day What changed: how much to take   ketoconazole 2 % shampoo Commonly known as: NIZORAL APPLY 1 APPLICATION TOPICALLY 2 (TWO) TIMES A WEEK.   Lantus SoloStar 100 UNIT/ML Solostar Pen Generic drug: insulin glargine Inject 14 Units into the skin daily. What changed:  how much to take when to take this   losartan 50 MG tablet Commonly known as: COZAAR Take 50 mg by mouth daily.   mupirocin ointment 2 % Commonly known as: BACTROBAN Apply 1 Application  topically 2 (two) times daily.   pantoprazole 40 MG tablet Commonly known as: PROTONIX Take 1 tablet (40 mg total) by mouth daily. Start taking on: December 02, 2022   Rybelsus 7 MG Tabs Generic drug: Semaglutide Take 7 mg by mouth daily.   sertraline 100 MG tablet Commonly known as: ZOLOFT Take 1.5 tablets (150 mg total) by mouth daily.   umeclidinium bromide 62.5 MCG/ACT Aepb Commonly known as: INCRUSE ELLIPTA Inhale 1 puff into the lungs daily. Start taking on: December 02, 2022        DISPOSITION AND FOLLOW-UP:  Ms.Courtney Bennett was discharged from [facility] in [place] condition. At the hospital follow up visit please address:  COPD/aspiration pneumonia: Treated on a 5-day course of Augmentin, started on Incruse, and sent home on albuterol.  Recommend repeat CT for right middle lobe collapse. Type 2 diabetes, uncontrolled: Stopped SGLT2, increased long-acting insulin.  Follow-up to assess blood sugars. Gastric distention: Started on PPI.  Gastric distention demonstrated on imaging.  Needs outpatient follow-up with GI.  Follow-up Recommendations: Consults: Pulmonary medicine, Gastroenterology Labs: None Studies: Follow up CT chest regarding R middle lobe collapse Medications: Patient needs assistant with diabetic management  Follow-up Appointments:  Follow-up Information     [facility]. Go on 12/14/2022.   Why: You are scheduled for a hospital follow up on December 14, 2022 at 830 am. Contact information: [address] [place] [address] [phone]                HOSPITAL COURSE:  Patient Summary: COPD/aspiration pneumonia RML Collapse Prior to admission, patient vomited several times and was found to be satting at 85% in the emergency department. CT of the chest demonstrated new right middle lobe collapse. There were no abnormal breath sounds on exam, and no changes in sputum quantity  or quality.  She was started on 3 L nasal cannula, and her respiratory status improved.  We started her on Incruse Ellipta and DuoNebs.  We also started her on Augmentin for concern of possible aspiration pneumonia in the setting of emesis.  On day 1 of hospitalization, she was taken off of oxygen and her O2 sats remained above 90.  She passed an ambulatory walk test without a need for supplemental oxygen.  She will need outpatient follow-up with pulmonology for management of her COPD and follow-up on her RML collapse, which may be due to aspiration event, but may need bronchoscopy to rule out endobronchial lesion.   Gastric distention GERD, LES thickening On admission, patient reported gastric distention.  This was demonstrated on CT exam. Given her poorly controlled diabetes, she is at risk for gastroparesis, which could be contributing to her symptoms.   She has a history of GERD previously managed by diet changes.  We started her on Protonix 40 mg twice daily, which improved her symptoms.  She will need outpatient follow-up with gastroenterology  to evaluate her reflux symptoms and LES thickening seen on CT imaging. Given tobacco use history she is at risk for esophageal malignancy, and given poorly controlled diabetes she is at risk for gastroparesis.    Type 2 diabetes, poorly controlled History of DKA Hemoglobin A1c was 12 on admission.  A few notes mention type 1 diabetes, but further chart review from earlier notes indicates that she has type 2 diabetes. She notes that she has difficulty with her insulin regimen given her housing situation, living out of her car.  She was previously admitted for DKA. She says she has an endocrinologist locally who helps manage this.  We discontinued her SGLT2 given history of DKA, as this can increase risk for euglycemic DKA.  We also increased her long-acting insulin for better glycemic control.   Social Determinants of Health, Living Situation Patient has been  living in her car with no air conditioner for a while, which has complicated her ability to manage her chronic health conditions.  She is on disability, but it is not enough to cover her basic needs.  She denies having any family or support locally to help her. She previously lived in a house in [place], but said the conditions were terrible and she was evicted.  She had negative experiences with [facility], and and does not have any caseworker or social worker currently helping her.  Our social work team has provided her with resources to help with her situation.   DISCHARGE INSTRUCTIONS:   Discharge Instructions     Ambulatory referral to Gastroenterology   Complete by: As directed    What is the reason for referral?: Other Comment - Gastric distension   Ambulatory referral to Pulmonology   Complete by: As directed    Reason for referral: Asthma/COPD   Call MD for:  difficulty breathing, headache or visual disturbances   Complete by: As directed    Call MD for:  persistant dizziness or light-headedness   Complete by: As directed    Call MD for:  persistant nausea and vomiting   Complete by: As directed    Diet - low sodium heart healthy   Complete by: As directed    Discharge instructions   Complete by: As directed    It was a pleasure to care of you at [facility].  You were admitted for difficulty breathing and low oxygen, possibly due to a pneumonia, and were treated for this.  We started you on some new medications as listed below:  For your COPD, please continue Incruse Ellipta 1 puff daily and start albuterol 1 to 2 puffs every 6 hours as needed.  We started an antibiotic, Augmentin, in the hospital.  Please continue to take 1 tablet every 12 hours for the next 3 days.  For your acid reflux, we have prescribed Protonix 40 mg.  Please take 1 tablet daily.  For your diabetes, we have slightly changed your insulin regimen.  Please use Lantus 14 units nightly, with 8 units of  Humalog 3 times daily with meals.  You can continue taking your other home medications as prescribed including the Zoloft, Rybelsus, losartan, gabapentin, and Lipitor.  You have a hospital follow-up appointment on July 15 at 8:30 AM with [facility].  We will be placing a referral to pulmonology and GI doctors.  We recommend you follow-up soon with your endocrinologist for your diabetes management.  Our social work team has given you resources for housing and other  services, that we recommend you consider.   Increase activity slowly   Complete by: As directed        SUBJECTIVE:   Patient was resting at the time of exam. She says she is not have any shortness of breath or breathing complaints.  Worked with PT this morning.  Ate dinner and breakfast.  Medically stable for discharge today.    Discharge Vitals:   BP (!) 142/74 (BP Location: Left Arm)   Pulse 84   Temp 98.1 F (36.7 C)   Resp 18   Ht 5\' 3"  (1.6 m)   Wt 47.8 kg   LMP 11/24/2014   SpO2 90%   BMI 18.67 kg/m   OBJECTIVE:  Physical Exam Constitutional:      Appearance: Normal appearance.  HENT:     Head: Normocephalic and atraumatic.  Cardiovascular:     Rate and Rhythm: Normal rate and regular rhythm.     Pulses: Normal pulses.     Heart sounds: Normal heart sounds.  Pulmonary:     Effort: Pulmonary effort is normal.     Breath sounds: Normal breath sounds.  Abdominal:     Palpations: Abdomen is soft.  Neurological:     General: No focal deficit present.     Mental Status: She is alert and oriented to person, place, and time.      Pertinent Labs, Studies, and Procedures:     Latest Ref Rng & Units 12/01/2022    4:21 AM 11/30/2022    6:41 AM 11/29/2022    8:26 PM  CBC  WBC 4.0 - 10.5 K/uL 10.1  7.9    Hemoglobin 12.0 - 15.0 g/dL 16.1  09.6  04.5   Hematocrit 36.0 - 46.0 % 41.9  48.3  42.0   Platelets 150 - 400 K/uL 215  233         Latest Ref Rng & Units 12/01/2022    4:21 AM  11/30/2022    6:41 AM 11/29/2022    8:26 PM  CMP  Glucose 70 - 99 mg/dL 409  811    BUN 6 - 20 mg/dL 18  16    Creatinine 9.14 - 1.00 mg/dL 7.82  9.56    Sodium 213 - 145 mmol/L 135  136  137   Potassium 3.5 - 5.1 mmol/L 3.8  4.9  4.2   Chloride 98 - 111 mmol/L 102  98    CO2 22 - 32 mmol/L 27  26    Calcium 8.9 - 10.3 mg/dL 8.3  8.6      Signed: Annett Fabian, MD Internal Medicine Resident, PGY-1 [facility]  Pager: [phone] 1:37 PM, 12/01/2022

## 2022-12-01 NOTE — Progress Notes (Signed)
O2 sat 88% on room air.  Productive cough thick clear and small.  O2 [place] placed and is at 2 liters.  O2 sat now 91-93%.

## 2022-12-01 NOTE — TOC Transition Note (Signed)
Transition of Care [facility]) - CM/SW Discharge Note   Patient Details  Name: Courtney Bennett MRN: [id number] Date of Birth: 10-22-1965  [facility] of [facility]) CM/SW Contact:  Janae Bridgeman, RN Phone Number: 12/01/2022, 12:28 PM   Clinical Narrative:    CM met with the patient at the bedside to discuss patient's discharge to the community today.  When I entered the hospital room - patient turned her head away from me and would not make eye contact or speak with me.    I offered transportation assistance by bus tickets and the patient did not respond.  When I spoke with the patient yesterday - she was aware that patient would be discharged back to the community once she was medically stable to discharge.  The patient's bedside nurse was provided with 4 bus tickets to assist with transportation to her car or shelter of her choice.  Shelter resources were provided to the patient at the bedside yesterday.  PCP follow up is noted in the AVS for the patient as well.   Final next level of care: Home/Self Care Barriers to Discharge: Continued Medical Work up   Patient Goals and CMS Choice CMS Medicare.gov Compare Post Acute Care list provided to:: Patient Choice offered to / list presented to : Patient  Discharge Placement                         Discharge Plan and Services Additional resources added to the After Visit Summary for     Discharge Planning Services: CM Consult Post Acute Care Choice: Resumption of Svcs/PTA Provider                               Social Determinants of Health (SDOH) Interventions SDOH Screenings   Food Insecurity: Food Insecurity Present (11/30/2022)  Housing: High Risk (11/30/2022)  Transportation Needs: Unmet Transportation Needs (11/30/2022)  Utilities: Patient Declined (11/30/2022)  Depression (PHQ2-9): Medium Risk (06/16/2019)  Tobacco Use: Medium Risk (11/29/2022)     Readmission Risk Interventions     No data to display

## 2022-12-01 NOTE — Inpatient Diabetes Management (Addendum)
Inpatient Diabetes Program Recommendations  AACE/ADA: New Consensus Statement on Inpatient Glycemic Control (2015)  Target Ranges:  Prepandial:   less than 140 mg/dL      Peak postprandial:   less than 180 mg/dL (1-2 hours)      Critically ill patients:  140 - 180 mg/dL   Lab Results  Component Value Date   GLUCAP 204 (H) 12/01/2022   HGBA1C 12.0 (H) 11/30/2022    Review of Glycemic Control  Latest Reference Range & Units 11/30/22 12:23 11/30/22 16:24 11/30/22 20:19 12/01/22 07:21  Glucose-Capillary 70 - 99 mg/dL 161 (H) 096 (H) 045 (H) 204 (H)  (H): Data is abnormally high Diabetes history: T1DM Outpatient Diabetes medications: Farxiga 10 mg every day, Levemir 12 units at bedtime, Novolog 8 units TID Current orders for Inpatient glycemic control: Semglee 10 units at bedtime, Novolog 0-9 units TID Novolog 8 units TID Prednisone 40 mg x 1 Solumedrol 125 mg x 1   Inpatient Diabetes Program Recommendations:     Consider increasing Semglee to 14 units at bedtime.   Attempted to reach patient regarding A1C. Will reattempt. Addendum: Spoke with patient regarding outpatient diabetes management. Patient has struggled to gain control of diabetes due to social situation and difficulty keeping insulin cool in her car.  Reviewed patient's current A1c of 12.0%. Explained what a A1c is and what it measures. Also reviewed goal A1c with patient, importance of good glucose control @ home, and blood sugar goals. Reviewed basic patho of DM, survival skills, interventions, vascular changes, and commorbidities. Patient will need a meter and more testing supplies as well as new scripts for insulin pens. Reviewed options and alternatives for keeping insulin cool. Patient tearful and frustrated; continued with encouragement and listening.  Secure chat sent to team with DC needs.  Thanks, Lujean Rave, MSN, RNC-OB Diabetes Coordinator [phone] (8a-5p)
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
## 2022-12-01 NOTE — TOC Benefit Eligibility Note (Signed)
Pharmacy Patient Advocate Encounter  Insurance verification completed.    The patient is insured through [facility]   Ran test claim for Incruse Ellipta 62.5 mcg and the current 30 day co-pay is $0.00.  Ran test claim for Albuterol Inhaler  and the current 30 day co-pay is $0.00.   This test claim was processed through [facility]- copay amounts may vary at other pharmacies due to pharmacy/plan contracts, or as the patient moves through the different stages of their insurance plan.    Roland Earl, CPHT Pharmacy Patient Advocate Specialist [facility] Patient Advocate Team Direct Number: [phone]  Fax: [phone]

## 2022-12-02 ENCOUNTER — Other Ambulatory Visit (HOSPITAL_COMMUNITY): Payer: Self-pay

## 2022-12-11 ENCOUNTER — Telehealth: Payer: Self-pay | Admitting: Podiatry

## 2022-12-11 NOTE — Telephone Encounter (Signed)
Spoke with patient she is going to see if she can get someone to bring her to try on her diabetic shoes, she will call back in a little while.

## 2022-12-14 ENCOUNTER — Ambulatory Visit: Payer: Self-pay

## 2022-12-14 ENCOUNTER — Inpatient Hospital Stay: Payer: Self-pay | Admitting: Family Medicine
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
## 2022-12-14 NOTE — Telephone Encounter (Signed)
 Noted  

## 2022-12-18 ENCOUNTER — Other Ambulatory Visit: Payer: Self-pay | Admitting: Nurse Practitioner

## 2022-12-18 DIAGNOSIS — N644 Mastodynia: Secondary | ICD-10-CM

## 2022-12-21 ENCOUNTER — Ambulatory Visit (INDEPENDENT_AMBULATORY_CARE_PROVIDER_SITE_OTHER): Payer: 59 | Admitting: Podiatry

## 2022-12-21 DIAGNOSIS — E1142 Type 2 diabetes mellitus with diabetic polyneuropathy: Secondary | ICD-10-CM

## 2022-12-21 DIAGNOSIS — M2012 Hallux valgus (acquired), left foot: Secondary | ICD-10-CM

## 2022-12-21 DIAGNOSIS — M2011 Hallux valgus (acquired), right foot: Secondary | ICD-10-CM

## 2022-12-21 DIAGNOSIS — M2010 Hallux valgus (acquired), unspecified foot: Secondary | ICD-10-CM

## 2022-12-21 NOTE — Progress Notes (Signed)

## 2022-12-23 ENCOUNTER — Other Ambulatory Visit: Payer: Self-pay | Admitting: Nurse Practitioner

## 2022-12-23 DIAGNOSIS — N644 Mastodynia: Secondary | ICD-10-CM

## 2022-12-28 ENCOUNTER — Encounter (HOSPITAL_COMMUNITY): Payer: Self-pay

## 2022-12-28 ENCOUNTER — Other Ambulatory Visit: Payer: Self-pay

## 2022-12-28 ENCOUNTER — Observation Stay (HOSPITAL_COMMUNITY)
Admission: EM | Admit: 2022-12-28 | Discharge: 2022-12-31 | Disposition: A | Discharge status: Home / Self Care | Payer: 59 | Attending: Family Medicine | Admitting: Family Medicine

## 2022-12-28 ENCOUNTER — Emergency Department (HOSPITAL_COMMUNITY): Payer: 59

## 2022-12-28 DIAGNOSIS — Z91040 Latex allergy status: Secondary | ICD-10-CM | POA: Insufficient documentation

## 2022-12-28 DIAGNOSIS — Z87891 Personal history of nicotine dependence: Secondary | ICD-10-CM | POA: Diagnosis not present

## 2022-12-28 DIAGNOSIS — I10 Essential (primary) hypertension: Secondary | ICD-10-CM | POA: Insufficient documentation

## 2022-12-28 DIAGNOSIS — Z794 Long term (current) use of insulin: Secondary | ICD-10-CM | POA: Insufficient documentation

## 2022-12-28 DIAGNOSIS — Z79899 Other long term (current) drug therapy: Secondary | ICD-10-CM | POA: Insufficient documentation

## 2022-12-28 DIAGNOSIS — R079 Chest pain, unspecified: Principal | ICD-10-CM

## 2022-12-28 DIAGNOSIS — R0789 Other chest pain: Principal | ICD-10-CM | POA: Insufficient documentation

## 2022-12-28 DIAGNOSIS — Z5900 Homelessness unspecified: Secondary | ICD-10-CM

## 2022-12-28 DIAGNOSIS — E1110 Type 2 diabetes mellitus with ketoacidosis without coma: Secondary | ICD-10-CM | POA: Insufficient documentation

## 2022-12-28 DIAGNOSIS — E119 Type 2 diabetes mellitus without complications: Secondary | ICD-10-CM | POA: Diagnosis present

## 2022-12-28 DIAGNOSIS — E43 Unspecified severe protein-calorie malnutrition: Secondary | ICD-10-CM | POA: Diagnosis not present

## 2022-12-28 DIAGNOSIS — J449 Chronic obstructive pulmonary disease, unspecified: Secondary | ICD-10-CM | POA: Diagnosis not present

## 2022-12-28 LAB — CBC
HCT: 45.3 % (ref 36.0–46.0)
Hemoglobin: 14.8 g/dL (ref 12.0–15.0)
MCH: 31.2 pg (ref 26.0–34.0)
MCHC: 32.7 g/dL (ref 30.0–36.0)
MCV: 95.6 fL (ref 80.0–100.0)
Platelets: 204 10*3/uL (ref 150–400)
RBC: 4.74 MIL/uL (ref 3.87–5.11)
RDW: 13 % (ref 11.5–15.5)
WBC: 5.4 10*3/uL (ref 4.0–10.5)
nRBC: 0 % (ref 0.0–0.2)

## 2022-12-28 LAB — BASIC METABOLIC PANEL
Anion gap: 9 (ref 5–15)
BUN: 22 mg/dL — ABNORMAL HIGH (ref 6–20)
CO2: 24 mmol/L (ref 22–32)
Calcium: 8.9 mg/dL (ref 8.9–10.3)
Chloride: 98 mmol/L (ref 98–111)
Creatinine, Ser: 0.73 mg/dL (ref 0.44–1.00)
GFR, Estimated: >60 mL/min (ref >60)
Glucose, Bld: 424 mg/dL — ABNORMAL HIGH (ref 70–99)
Potassium: 4.4 mmol/L (ref 3.5–5.1)
Sodium: 131 mmol/L — ABNORMAL LOW (ref 135–145)

## 2022-12-28 LAB — TROPONIN I (HIGH SENSITIVITY): Troponin I (High Sensitivity): 23 ng/L — ABNORMAL HIGH (ref <18)

## 2022-12-28 LAB — CBG MONITORING, ED: Glucose-Capillary: 398 mg/dL — ABNORMAL HIGH (ref 70–99)

## 2022-12-28 NOTE — ED Notes (Signed)
Save blue tube in main lab °

## 2022-12-28 NOTE — ED Triage Notes (Signed)
Pt c/o left sided chest pain that started about an hour ago.

## 2022-12-29 ENCOUNTER — Observation Stay (HOSPITAL_COMMUNITY): Payer: 59

## 2022-12-29 DIAGNOSIS — R0789 Other chest pain: Secondary | ICD-10-CM | POA: Diagnosis present

## 2022-12-29 DIAGNOSIS — R079 Chest pain, unspecified: Secondary | ICD-10-CM

## 2022-12-29 DIAGNOSIS — I10 Essential (primary) hypertension: Secondary | ICD-10-CM | POA: Insufficient documentation

## 2022-12-29 LAB — ECHOCARDIOGRAM COMPLETE
Area-P 1/2: 3.65 cm2
Height: 63 in
S' Lateral: 2.8 cm
Weight: 1712 oz

## 2022-12-29 LAB — GLUCOSE, CAPILLARY
Glucose-Capillary: 326 mg/dL — ABNORMAL HIGH (ref 70–99)
Glucose-Capillary: 208 mg/dL — ABNORMAL HIGH (ref 70–99)
Glucose-Capillary: 127 mg/dL — ABNORMAL HIGH (ref 70–99)

## 2022-12-29 LAB — LIPID PANEL
Cholesterol: 134 mg/dL (ref 0–200)
HDL: 50 mg/dL (ref >40)
LDL Cholesterol: 67 mg/dL (ref 0–99)
Total CHOL/HDL Ratio: 2.7 RATIO
Triglycerides: 84 mg/dL (ref <150)
VLDL: 17 mg/dL (ref 0–40)

## 2022-12-29 LAB — CBG MONITORING, ED
Glucose-Capillary: 317 mg/dL — ABNORMAL HIGH (ref 70–99)
Glucose-Capillary: 304 mg/dL — ABNORMAL HIGH (ref 70–99)
Glucose-Capillary: 44 mg/dL — CL (ref 70–99)
Glucose-Capillary: 65 mg/dL — ABNORMAL LOW (ref 70–99)
Glucose-Capillary: 80 mg/dL (ref 70–99)
Glucose-Capillary: 85 mg/dL (ref 70–99)

## 2022-12-29 LAB — MRSA NEXT GEN BY PCR, NASAL: MRSA by PCR Next Gen: NOT DETECTED

## 2022-12-29 LAB — D-DIMER, QUANTITATIVE: D-Dimer, Quant: <0.27 ug/mL-FEU (ref 0.00–0.50)

## 2022-12-29 MED ORDER — INSULIN GLARGINE-YFGN 100 UNIT/ML ~~LOC~~ SOLN: 14.0000 [IU] | Freq: Every day | SUBCUTANEOUS | Status: DC

## 2022-12-29 MED ORDER — GABAPENTIN 100 MG PO CAPS
100.0000 mg | ORAL_CAPSULE | Freq: Two times a day (BID) | ORAL | Status: DC
  Administered 2022-12-29: 100 mg via ORAL
  Filled 2022-12-29 (×4): qty 1

## 2022-12-29 MED ORDER — PANTOPRAZOLE SODIUM 40 MG PO TBEC
40.0000 mg | DELAYED_RELEASE_TABLET | Freq: Two times a day (BID) | ORAL | Status: DC
  Administered 2022-12-29 – 2022-12-31 (×5): 40 mg via ORAL
  Filled 2022-12-29 (×5): qty 1

## 2022-12-29 MED ORDER — LACTATED RINGERS IV SOLN: INTRAVENOUS | Status: DC

## 2022-12-29 MED ORDER — UMECLIDINIUM BROMIDE 62.5 MCG/ACT IN AEPB
1.0000 | INHALATION_SPRAY | Freq: Every day | RESPIRATORY_TRACT | Status: DC
  Administered 2022-12-30 – 2022-12-31 (×2): 1 via RESPIRATORY_TRACT
  Filled 2022-12-29: qty 7

## 2022-12-29 MED ORDER — OXYCODONE HCL 5 MG PO TABS: 5.0000 mg | ORAL_TABLET | ORAL | Status: DC | PRN

## 2022-12-29 MED ORDER — ENSURE ENLIVE PO LIQD: 237.0000 mL | Freq: Two times a day (BID) | ORAL | Status: DC

## 2022-12-29 MED ORDER — ALBUTEROL SULFATE (2.5 MG/3ML) 0.083% IN NEBU: 2.5000 mg | INHALATION_SOLUTION | RESPIRATORY_TRACT | Status: DC | PRN

## 2022-12-29 MED ORDER — ONDANSETRON HCL 4 MG/2ML IJ SOLN: 4.0000 mg | Freq: Four times a day (QID) | INTRAMUSCULAR | Status: DC | PRN

## 2022-12-29 MED ORDER — NITROGLYCERIN 0.4 MG SL SUBL: 0.4000 mg | SUBLINGUAL_TABLET | SUBLINGUAL | Status: DC | PRN

## 2022-12-29 MED ORDER — ASPIRIN 81 MG PO TBEC: 81.0000 mg | DELAYED_RELEASE_TABLET | Freq: Every day | ORAL | Status: DC

## 2022-12-29 MED ORDER — DEXTROSE 50 % IV SOLN
INTRAVENOUS | Status: AC
  Filled 2022-12-29: qty 50

## 2022-12-29 MED ORDER — KETOROLAC TROMETHAMINE 15 MG/ML IJ SOLN: 15.0000 mg | Freq: Four times a day (QID) | INTRAMUSCULAR | Status: DC | PRN

## 2022-12-29 MED ORDER — INSULIN ASPART 100 UNIT/ML IJ SOLN
0.0000 [IU] | INTRAMUSCULAR | Status: DC
  Administered 2022-12-29: 7 [IU] via SUBCUTANEOUS
  Filled 2022-12-29: qty 0.09

## 2022-12-29 MED ORDER — MELATONIN 5 MG PO TABS: 5.0000 mg | ORAL_TABLET | Freq: Every evening | ORAL | Status: DC | PRN

## 2022-12-29 MED ORDER — ASPIRIN 81 MG PO CHEW
324.0000 mg | CHEWABLE_TABLET | Freq: Once | ORAL | Status: AC
  Administered 2022-12-29: 324 mg via ORAL
  Filled 2022-12-29: qty 4

## 2022-12-29 MED ORDER — INSULIN GLARGINE-YFGN 100 UNIT/ML ~~LOC~~ SOLN
5.0000 [IU] | Freq: Every day | SUBCUTANEOUS | Status: DC
  Administered 2022-12-30 – 2022-12-31 (×2): 5 [IU] via SUBCUTANEOUS
  Filled 2022-12-29 (×2): qty 0.05

## 2022-12-29 MED ORDER — GLUCAGON HCL RDNA (DIAGNOSTIC) 1 MG IJ SOLR
INTRAMUSCULAR | Status: AC
  Filled 2022-12-29: qty 1

## 2022-12-29 MED ORDER — ASPIRIN 81 MG PO TBEC
81.0000 mg | DELAYED_RELEASE_TABLET | Freq: Every day | ORAL | Status: DC
  Administered 2022-12-29 – 2022-12-31 (×3): 81 mg via ORAL
  Filled 2022-12-29 (×3): qty 1

## 2022-12-29 MED ORDER — ENOXAPARIN SODIUM 40 MG/0.4ML IJ SOSY
40.0000 mg | PREFILLED_SYRINGE | INTRAMUSCULAR | Status: DC
  Administered 2022-12-29 – 2022-12-31 (×3): 40 mg via SUBCUTANEOUS
  Filled 2022-12-29 (×3): qty 0.4

## 2022-12-29 MED ORDER — ACETAMINOPHEN 325 MG PO TABS: 650.0000 mg | ORAL_TABLET | ORAL | Status: DC | PRN

## 2022-12-29 MED ORDER — INSULIN GLARGINE-YFGN 100 UNIT/ML ~~LOC~~ SOLN
7.0000 [IU] | Freq: Once | SUBCUTANEOUS | Status: AC
  Administered 2022-12-29: 7 [IU] via SUBCUTANEOUS
  Filled 2022-12-29: qty 0.07

## 2022-12-29 MED ORDER — KETOROLAC TROMETHAMINE 30 MG/ML IJ SOLN
15.0000 mg | Freq: Once | INTRAMUSCULAR | Status: DC
  Filled 2022-12-29: qty 1

## 2022-12-29 MED ORDER — LOSARTAN POTASSIUM 50 MG PO TABS
50.0000 mg | ORAL_TABLET | Freq: Every day | ORAL | Status: DC
  Administered 2022-12-29 – 2022-12-31 (×3): 50 mg via ORAL
  Filled 2022-12-29: qty 2
  Filled 2022-12-29 (×2): qty 1

## 2022-12-29 MED ORDER — ATORVASTATIN CALCIUM 20 MG PO TABS
20.0000 mg | ORAL_TABLET | Freq: Every day | ORAL | Status: DC
  Filled 2022-12-29 (×2): qty 1
  Filled 2022-12-29: qty 2

## 2022-12-29 MED ORDER — DEXTROSE 50 % IV SOLN: 25.0000 g | INTRAVENOUS | Status: AC

## 2022-12-29 MED ORDER — INSULIN ASPART 100 UNIT/ML IJ SOLN
0.0000 [IU] | INTRAMUSCULAR | Status: DC
  Administered 2022-12-29: 2 [IU] via SUBCUTANEOUS
  Administered 2022-12-29: 4 [IU] via SUBCUTANEOUS
  Administered 2022-12-30: 5 [IU] via SUBCUTANEOUS
  Administered 2022-12-30: 3 [IU] via SUBCUTANEOUS
  Administered 2022-12-30 (×3): 1 [IU] via SUBCUTANEOUS
  Administered 2022-12-31: 2 [IU] via SUBCUTANEOUS
  Administered 2022-12-31: 3 [IU] via SUBCUTANEOUS
  Filled 2022-12-29: qty 0.06

## 2022-12-29 MED ORDER — HYDRALAZINE HCL 20 MG/ML IJ SOLN: 5.0000 mg | Freq: Four times a day (QID) | INTRAMUSCULAR | Status: DC | PRN

## 2022-12-29 NOTE — ED Provider Notes (Signed)
[place] EMERGENCY DEPARTMENT AT [facility] Provider Note   CSN: [id number] Arrival date & time: 12/28/22  2127     History  Chief Complaint  Patient presents with   Chest Pain    Courtney Bennett is a 57 y.o. female.  Patient with a history of bipolar disorder, diabetes, hypertension, fibromyalgia here with intermittent chest pain since approximately 9 PM.  She is a poor historian.  She states she developed left-sided chest pain that feels like a "something sitting on me" that radiates to her upper back and arm.  Symptoms last a few seconds at a time but recur frequently.  She is not certain how long the pain lasts.  Is associate with some shortness of breath and nausea.  No fever or diaphoresis.  No cough or fever.  No leg pain or leg swelling. She is uncertain of her cardiac history.  Denies ever having a heart attack.  Does not think she is ever had a stress test.  She is currently homeless and living in her car. Denies any alcohol or cocaine abuse States she still having a dull ache in her chest currently.  Nothing makes it better or worse.  She reports EMS gave her some albuterol without improvement  The history is provided by the patient and the EMS personnel.  Chest Pain Associated symptoms: shortness of breath   Associated symptoms: no abdominal pain, no dizziness, no fever, no headache, no nausea, no vomiting and no weakness        Home Medications Prior to Admission medications   Medication Sig Start Date End Date Taking? Authorizing Provider  albuterol (VENTOLIN HFA) 108 (90 Base) MCG/ACT inhaler Inhale 1-2 puffs into the lungs every 6 (six) hours as needed. 12/01/22   Atway, Rayann N, DO  atorvastatin (LIPITOR) 20 MG tablet Take 1 tablet (20 mg total) by mouth daily. 12/15/19   Alwyn Ren, MD  blood glucose meter kit and supplies Dispense based on patient and insurance preference. Use up to four times daily as directed. (FOR ICD-10 E10.9, E11.9).  12/15/19   Alwyn Ren, MD  chlorhexidine (PERIDEX) 0.12 % solution as directed. 11/30/19   [provider]  Continuous Blood Gluc Sensor (FREESTYLE LIBRE 14 DAY SENSOR) MISC 1 application by Subdermal route See admin instructions. Check blood glucose 1-2 times daily. E11.65 05/11/19   CiriglianoJearld Lesch, DO  [employer] B-12 500 MCG tablet Take 500 mcg by mouth daily. 09/26/19   [provider]  estrogens, conjugated, (PREMARIN) 0.3 MG tablet Take 1 tablet (0.3 mg total) by mouth daily. 06/16/19   Cirigliano, Jearld Lesch, DO  gabapentin (NEURONTIN) 300 MG capsule Take 300 mg by mouth 2 (two) times daily.     [provider]  glucose blood (ACCU-CHEK AVIVA PLUS) test strip Use twice daily 12/01/22   Atway, Rayann N, DO  HUMALOG KWIKPEN 200 UNIT/ML KwikPen Inject 8 Units into the skin 3 (three) times daily with meals. 8 units 3 times per day before meals + correctional insulin MDD 50 units per day Subcutaneous for 30 days 12/01/22   Atway, Rayann N, DO  Insulin Pen Needle 32G X 4 MM MISC Use 2 times per day 12/01/22   Atway, Rayann N, DO  ketoconazole (NIZORAL) 2 % shampoo APPLY 1 APPLICATION TOPICALLY 2 (TWO) TIMES A WEEK. 07/13/19   Cirigliano, Mary K, DO  LANTUS SOLOSTAR 100 UNIT/ML Solostar Pen Inject 14 Units into the skin daily. 12/01/22   Chauncey Mann,  DO  losartan (COZAAR) 50 MG tablet Take 50 mg by mouth daily. 10/13/22   [provider]  mupirocin ointment (BACTROBAN) 2 % Apply 1 Application topically 2 (two) times daily.    [provider]  pantoprazole (PROTONIX) 40 MG tablet Take 1 tablet (40 mg total) by mouth daily. 12/02/22   Atway, Rayann N, DO  RYBELSUS 7 MG TABS Take 7 mg by mouth daily. 06/10/22   [provider]  sertraline (ZOLOFT) 100 MG tablet Take 1.5 tablets (150 mg total) by mouth daily. 05/05/19   Cirigliano, Jearld Lesch, DO  umeclidinium bromide (INCRUSE ELLIPTA) 62.5 MCG/ACT AEPB Inhale 1 puff into the lungs daily. 12/02/22   Atway, Rayann N, DO       Allergies    Metformin, Elemental sulfur, Lamictal [lamotrigine], Latex, Sulfa antibiotics, and Tape    Review of Systems   Review of Systems  Constitutional:  Negative for activity change, appetite change and fever.  HENT:  Negative for congestion and rhinorrhea.   Respiratory:  Positive for chest tightness and shortness of breath.   Cardiovascular:  Positive for chest pain. Negative for leg swelling.  Gastrointestinal:  Negative for abdominal pain, nausea and vomiting.  Genitourinary:  Negative for dysuria and hematuria.  Musculoskeletal:  Negative for arthralgias and myalgias.  Skin:  Negative for rash.  Neurological:  Negative for dizziness, weakness and headaches.   all other systems are negative except as noted in the HPI and PMH.    Physical Exam Updated Vital Signs BP (!) 149/90 (BP Location: Right Arm)   Pulse 62   Temp (!) 97.4 F (36.3 C) (Oral)   Resp 16   Ht 5\' 3"  (1.6 m)   Wt 48.5 kg   LMP 11/24/2014   SpO2 97%   BMI 18.95 kg/m  Physical Exam Vitals and nursing note reviewed.  Constitutional:      General: She is not in acute distress.    Appearance: She is well-developed.  HENT:     Head: Normocephalic and atraumatic.     Mouth/Throat:     Pharynx: No oropharyngeal exudate.  Eyes:     Conjunctiva/sclera: Conjunctivae normal.     Pupils: Pupils are equal, round, and reactive to light.  Neck:     Comments: No meningismus. Cardiovascular:     Rate and Rhythm: Normal rate and regular rhythm.     Heart sounds: Normal heart sounds. No murmur heard. Pulmonary:     Effort: Pulmonary effort is normal. No respiratory distress.     Breath sounds: Wheezing present.  Chest:     Chest wall: No tenderness.  Abdominal:     Palpations: Abdomen is soft.     Tenderness: There is no abdominal tenderness. There is no guarding or rebound.  Musculoskeletal:        General: No tenderness. Normal range of motion.     Cervical back: Normal range of motion and  neck supple.  Skin:    General: Skin is warm.  Neurological:     Mental Status: She is alert and oriented to person, place, and time.     Cranial Nerves: No cranial nerve deficit.     Motor: No abnormal muscle tone.     Coordination: Coordination normal.     Comments:  5/5 strength throughout. CN 2-12 intact.Equal grip strength.   Psychiatric:        Behavior: Behavior normal.     ED Results / Procedures / Treatments   Labs (all labs ordered  are listed, but only abnormal results are displayed) Labs Reviewed  BASIC METABOLIC PANEL - Abnormal; Notable for the following components:      Result Value   Sodium 131 (*)    Glucose, Bld 424 (*)    BUN 22 (*)    All other components within normal limits  CBG MONITORING, ED - Abnormal; Notable for the following components:   Glucose-Capillary 398 (*)    All other components within normal limits  CBG MONITORING, ED - Abnormal; Notable for the following components:   Glucose-Capillary 317 (*)    All other components within normal limits  TROPONIN I (HIGH SENSITIVITY) - Abnormal; Notable for the following components:   Troponin I (High Sensitivity) 23 (*)    All other components within normal limits  TROPONIN I (HIGH SENSITIVITY) - Abnormal; Notable for the following components:   Troponin I (High Sensitivity) 21 (*)    All other components within normal limits  CBC  D-DIMER, QUANTITATIVE    EKG EKG Interpretation Date/Time:  Monday December 28 2022 21:36:25 EDT Ventricular Rate:  78 PR Interval:  194 QRS Duration:  100 QT Interval:  360 QTC Calculation: 410 R Axis:   72  Text Interpretation: Sinus rhythm Right atrial enlargement Probable LVH with secondary repol abnrm No significant change was found Confirmed by Glynn Octave [phone]) on 12/29/2022 2:32:00 AM  Radiology DG Chest 2 View  Result Date: 12/28/2022 CLINICAL DATA:  Left-sided chest pain. EXAM: CHEST - 2 VIEW COMPARISON:  December 05, 2022 FINDINGS: The heart size and  mediastinal contours are within normal limits. There is no evidence of an acute infiltrate, pleural effusion or pneumothorax. The visualized skeletal structures are unremarkable. IMPRESSION: No active cardiopulmonary disease. Electronically Signed   By: Aram Candela M.D.   On: 12/28/2022 22:20    Procedures Procedures    Medications Ordered in ED Medications  aspirin chewable tablet 324 mg (has no administration in time range)  ketorolac (TORADOL) 30 MG/ML injection 15 mg (has no administration in time range)    ED Course/ Medical Decision Making/ A&P                             Medical Decision Making Amount and/or Complexity of Data Reviewed Independent Historian: EMS Labs: ordered. Decision-making details documented in ED Course. Radiology: ordered and independent interpretation performed. Decision-making details documented in ED Course. ECG/medicine tests: ordered and independent interpretation performed. Decision-making details documented in ED Course.  Risk OTC drugs. Prescription drug management. Decision regarding hospitalization.   Intermittent left-sided chest pain since 9 PM.  Vitals are stable.  EKG shows T wave inversions laterally consistent with previous.  Aspirin given. HEART score 4   Patient is a poor historian.  Her labs show hyperglycemia without DKA.  Troponin minimally elevated 21.  Chest x-ray negative for infiltrate.  D-dimer negative.  Low suspicion for PE or aortic dissection.  Still having chest pain but declines aspirin, nitroglycerin, Toradol.  Will trend troponin.  Will be reasonable to admit patient for echocardiogram given her elevated troponin and risk factors and heart score 4.  Low suspicion for PE or aortic dissection.  Will admit for chest pain rule out.  Minimally elevated troponin.  Discussed with Dr. Margo Aye.        Final Clinical Impression(s) / ED Diagnoses Final diagnoses:  None    Rx / DC Orders ED Discharge Orders      None  Glynn Octave, MD 12/29/22 [phone]

## 2022-12-29 NOTE — ED Notes (Signed)
Bishop Limbo, NP notified of pt bilateral finger/hand tingling that has been ongoing intermittently for several days

## 2022-12-29 NOTE — Progress Notes (Signed)
  Echocardiogram 2D Echocardiogram has been performed.  Leda Roys RDCS 12/29/2022, 2:50 PM

## 2022-12-29 NOTE — H&P (Addendum)
History and Physical    Patient: Courtney Bennett NWG:[id number] DOB: Oct 08, 1965 DOA: 12/28/2022 DOS: the patient was seen and examined on 12/29/2022 PCP: Patient, No Pcp Per  Patient coming from: Homeless  Chief Complaint:  Chief Complaint  Patient presents with   Chest Pain   HPI: Courtney Bennett is a 57 y.o. female with medical history significant of  bipolar disorder, diabetes, hypertension, and fibromyalgia.  She presented to [facility] ED with reports of intermittent chest pain that started at ~2100 last night. She reports the pain is left-sided and feels as though something is sitting on her chest with radiation to her upper back and (L)arm.  Her symptoms last for a few seconds at a time and resolve spontaneously with frequent recurrence.  She reports associated shortness of breath and nausea with her episodes of chest pain.  She denies diaphoresis, abdominal pain, dizziness, cough, fever, leg pain, or leg swelling.  There are no associated aggravating or alleviating factors with the chest pain, it is not reproducible on exam, and it was not improved with albuterol given by EMS.  ED Course: Labs in ED remarkable for troponin of 23, with repeat of 21, sodium 131, glucose 424, anion gap 9, BUN 22, and D-dimer <0.27.  Chest x-ray clear.  She was given 324 mg of aspirin and IV Toradol in ED. [facility] contacted for admission.    Review of Systems: As mentioned in the history of present illness. All other systems reviewed and are negative. Past Medical History:  Diagnosis Date   Bipolar 1 disorder (HCC)    Diabetes mellitus    Fibromyalgia    Hypertension    Past Surgical History:  Procedure Laterality Date   ABDOMINAL HYSTERECTOMY  01/2019   BREAST BIOPSY Right    CESAREAN SECTION     HERNIA REPAIR     Social History:  reports that she quit smoking about 8 years ago. Her smoking use included cigarettes. She has never used smokeless tobacco. She reports current alcohol use. She reports  that she does not use drugs.  Allergies  Allergen Reactions   Metformin Diarrhea   Elemental Sulfur Hives, Rash and Other (See Comments)    "Burns the skin," also   Lamictal [Lamotrigine] Rash   Latex Itching and Rash   Sulfa Antibiotics Rash   Tape Rash and Other (See Comments)    Irritates the skin    Family History  Problem Relation Age of Onset   Diabetes Father    Breast cancer Mother    Breast cancer Paternal Grandmother     Prior to Admission medications   Medication Sig Start Date End Date Taking? Authorizing Provider  albuterol (VENTOLIN HFA) 108 (90 Base) MCG/ACT inhaler Inhale 1-2 puffs into the lungs every 6 (six) hours as needed. 12/01/22   Atway, Rayann N, DO  atorvastatin (LIPITOR) 20 MG tablet Take 1 tablet (20 mg total) by mouth daily. 12/15/19   Alwyn Ren, MD  blood glucose meter kit and supplies Dispense based on patient and insurance preference. Use up to four times daily as directed. (FOR ICD-10 E10.9, E11.9). 12/15/19   Alwyn Ren, MD  chlorhexidine (PERIDEX) 0.12 % solution as directed. 11/30/19   [provider]  Continuous Blood Gluc Sensor (FREESTYLE LIBRE 14 DAY SENSOR) MISC 1 application by Subdermal route See admin instructions. Check blood glucose 1-2 times daily. E11.65 05/11/19   CiriglianoJearld Lesch, DO  [employer] B-12 500 MCG tablet Take 500 mcg by mouth  daily. 09/26/19   [provider]  estrogens, conjugated, (PREMARIN) 0.3 MG tablet Take 1 tablet (0.3 mg total) by mouth daily. 06/16/19   Cirigliano, Jearld Lesch, DO  gabapentin (NEURONTIN) 300 MG capsule Take 300 mg by mouth 2 (two) times daily.     [provider]  glucose blood (ACCU-CHEK AVIVA PLUS) test strip Use twice daily 12/01/22   Atway, Rayann N, DO  HUMALOG KWIKPEN 200 UNIT/ML KwikPen Inject 8 Units into the skin 3 (three) times daily with meals. 8 units 3 times per day before meals + correctional insulin MDD 50 units per day Subcutaneous for 30 days 12/01/22    Atway, Rayann N, DO  Insulin Pen Needle 32G X 4 MM MISC Use 2 times per day 12/01/22   Atway, Rayann N, DO  ketoconazole (NIZORAL) 2 % shampoo APPLY 1 APPLICATION TOPICALLY 2 (TWO) TIMES A WEEK. 07/13/19   Cirigliano, Mary K, DO  LANTUS SOLOSTAR 100 UNIT/ML Solostar Pen Inject 14 Units into the skin daily. 12/01/22   Atway, Rayann N, DO  losartan (COZAAR) 50 MG tablet Take 50 mg by mouth daily. 10/13/22   [provider]  mupirocin ointment (BACTROBAN) 2 % Apply 1 Application topically 2 (two) times daily.    [provider]  pantoprazole (PROTONIX) 40 MG tablet Take 1 tablet (40 mg total) by mouth daily. 12/02/22   Atway, Rayann N, DO  RYBELSUS 7 MG TABS Take 7 mg by mouth daily. 06/10/22   [provider]  sertraline (ZOLOFT) 100 MG tablet Take 1.5 tablets (150 mg total) by mouth daily. 05/05/19   Cirigliano, Jearld Lesch, DO  umeclidinium bromide (INCRUSE ELLIPTA) 62.5 MCG/ACT AEPB Inhale 1 puff into the lungs daily. 12/02/22   Chauncey Mann, DO    Physical Exam: Vitals:   12/28/22 2132 12/28/22 2132 12/29/22 0237 12/29/22 0609  BP:  139/85 (!) 149/90 (!) 168/77  Pulse:  78 62 66  Resp:  14 16 16   Temp:  97.8 F (36.6 C) (!) 97.4 F (36.3 C) 97.6 F (36.4 C)  TempSrc:  Oral Oral Oral  SpO2: 96% 97% 97% 96%  Weight:      Height:        Constitutional: NAD Eyes: PERRL, lids and conjunctivae normal ENMT: Mucous membranes are dry. Posterior pharynx clear of any exudate or lesions.  Neck: normal, supple, no masses, no thyromegaly Respiratory: clear to auscultation bilaterally, no wheezing, no crackles. Normal respiratory effort. No accessory muscle use.  Cardiovascular: Regular rate and rhythm, no murmurs / rubs / gallops. No extremity edema. 2+ radial and pedal pulses.   Abdomen: no tenderness, no masses palpated. No hepatosplenomegaly. Bowel sounds positive.  Musculoskeletal: No joint deformity upper and lower extremities. Good ROM, no contractures. Normal muscle tone.   Skin: no rashes, lesions, ulcers.  Neurologic:Alert and oriented x 3.   Data Reviewed: CBC    Component Value Date/Time   WBC 5.4 12/28/2022 2140   RBC 4.74 12/28/2022 2140   HGB 14.8 12/28/2022 2140   HGB 16.3 (H) 05/02/2019 1424   HCT 45.3 12/28/2022 2140   HCT 49.2 (H) 05/02/2019 1424   PLT 204 12/28/2022 2140   PLT 264 05/02/2019 1424   MCV 95.6 12/28/2022 2140   MCV 90 05/02/2019 1424   MCH 31.2 12/28/2022 2140   MCHC 32.7 12/28/2022 2140   RDW 13.0 12/28/2022 2140   RDW 13.2 05/02/2019 1424   LYMPHSABS 1.7 11/29/2022 1857   MONOABS 0.8 11/29/2022 1857   EOSABS 0.2  11/29/2022 1857   BASOSABS 0.1 11/29/2022 1857      Latest Ref Rng & Units 12/28/2022    9:40 PM 12/01/2022    4:21 AM 11/30/2022    6:41 AM  BMP  Glucose 70 - 99 mg/dL 161  096  045   BUN 6 - 20 mg/dL 22  18  16    Creatinine 0.44 - 1.00 mg/dL 4.09  8.11  9.14   Sodium 135 - 145 mmol/L 131  135  136   Potassium 3.5 - 5.1 mmol/L 4.4  3.8  4.9   Chloride 98 - 111 mmol/L 98  102  98   CO2 22 - 32 mmol/L 24  27  26    Calcium 8.9 - 10.3 mg/dL 8.9  8.3  8.6    Cardiac Panel (last 3 results) Recent Labs    12/28/22 0256 12/28/22 2140  TROPONINIHS 21* 23*   D-Dimer    Component Value Date/Time   D-DIMER <0.27 12/29/2022 0256       EKG Interpretation: NSR HR 78 with lateral T-wave inversions that were present on prior EKGs  Assessment and Plan: #Atypical Chest Pain - s/p 324 mg ASA, Start ASA 81 mg daily - Atorvastatin 20 mg daily - Lipid panel - Hemoglobin A1c :12.0 on 11/30/22, will not repeat - SL Nitro PRN - PRN Analgesia - Spoke with Dr Excell Seltzer with Cardiology who recommended an ECHO. If normal does not think she needs additional inpatient workup with Troponin currently being in range with recent Troponin levels   #Type II Diabetes Mellitus, uncontrolled Glucose 424 on BMP last night. Endorses polydipsia. A1C 12.0 in July. Hypoglycemic event this morning after receiving 7U Semglee (50% home  dose) and 7U Novolog for CBG of 304. - Reduce Semglee to 5U daily - Sensitive SSI - q4H CBG monitoring while NPO - LR at 75 mL/hr while NPO  #Hyperlipidemia - Continue home Atorvastatin - Lipid panel  #Hypertension SBP 160s-170s this AM - Prescribed Losartan, non-compliant. Will reorder with documented elevated Bps. - PRN hydralazine while NPO  #COPD Stable - Continue home Incruse Ellipta - PRN nebulizer while in hospital  #Bipolar Affective Disorder Does not appear to be on outpatient medication   VTE prophylaxis: Lovenox  GI prophylaxis: Protonix Diet: NPO pending Swallow Evaluation Access: PIV Lines: NONE Code Status: FULL Telemetry: Yes Disposition: Observation  Advance Care Planning: Code Status: Full Code  Consults: Spoke with Cardiology, will formally consult if ECHO abnormal  Family Communication: None at bedside  Severity of Illness: The appropriate patient status for this patient is OBSERVATION. Observation status is judged to be reasonable and necessary in order to provide the required intensity of service to ensure the patient's safety. The patient's presenting symptoms, physical exam findings, and initial radiographic and laboratory data in the context of their medical condition is felt to place them at decreased risk for further clinical deterioration. Furthermore, it is anticipated that the patient will be medically stable for discharge from the hospital within 2 midnights of admission.   To reach the provider On-Call:   7AM- 7PM see care teams to locate the attending and reach out to them via [email or web address] Password: [email or web address] 7PM-7AM contact night-coverage If you still have difficulty reaching the appropriate provider, please page the [facility] (Director on Call) for [facility] on amion for assistance  This document was prepared using Conservation officer, historic buildings and may include unintentional dictation errors.  Bishop Limbo FNP-BC, PMHNP-BC Nurse  Practitioner [facility]

## 2022-12-29 NOTE — Progress Notes (Signed)
Clinical swallow evaluation completed.  Patient presents with baseline congested cough which she attributes to smoking.  She also reports fairly recent dental extraction bilateral and some discomfort with consumption.  Patient's articulation was clear and voice was strong.  She was able to self-feed [employer] cookie, [place] ice, apple juice mixed with [place] ice and water.  Patient with some wincing reporting displeasure with the taste, but no indication of airway compromise.  She endorses some dysphagia stating she has a hard time swallowing and feels like things are sticking in her pharynx which she attributes to her thyroid nodules.  Has history of reflux and took a PPI which she reported had improved her symptoms but she stopped taking it due to losing the medication, thus causing SLP to suspect her history of hiatal hernia and reflux may be primary source of her dysphagia symptoms.  Recommend regular thin diet.  Of note patient had prior modified barium swallow test in 2021 when she had DKA and was intubated for a few days.  That MBS study showed normal swallow.  Informed RN of recommendations and left swallow precautions signs in the room with the patient using teach back for precautions.    Rolena Infante, MS [facility] SLP Acute [employer] [phone]

## 2022-12-29 NOTE — ED Notes (Signed)
Pt requesting social work for her home living situation. Bishop Limbo, PA notified of pt request

## 2022-12-29 NOTE — ED Notes (Signed)
ED TO INPATIENT HANDOFF REPORT  ED Nurse Name and Phone #: Thamas Jaegers Name/Age/Gender Courtney Bennett 57 y.o. female Room/Bed: WA07/WA07  Code Status   Code Status: Full Code  Home/SNF/Other Home; social work consulted due to pt being homeless and needing a place to live Patient oriented to: self, place, time, and situation Is this baseline? Yes   Triage Complete: Triage complete  Chief Complaint Atypical chest pain [R07.89]  Triage Note Pt c/o left sided chest pain that started about an hour ago.    Allergies Allergies  Allergen Reactions   Metformin Diarrhea   Elemental Sulfur Hives, Rash and Other (See Comments)    "Burns the skin," also   Lamictal [Lamotrigine] Rash   Latex Itching and Rash   Sulfa Antibiotics Rash   Tape Rash and Other (See Comments)    Irritates the skin    Level of Care/Admitting Diagnosis ED Disposition     ED Disposition  Admit   Condition  --   Comment  Hospital Area: [facility] [[id number]]  Level of Care: Progressive [102]  Admit to Progressive based on following criteria: CARDIOVASCULAR & THORACIC of moderate stability with acute coronary syndrome symptoms/low risk myocardial infarction/hypertensive urgency/arrhythmias/heart failure potentially compromising stability and stable post cardiovascular intervention patients.  May place patient in observation at [facility] or [facility] if equivalent level of care is available:: Yes  Covid Evaluation: Asymptomatic - no recent exposure (last 10 days) testing not required  Diagnosis: Atypical chest pain [297556]  Admitting Physician: Darlin Drop [[id number]]  Attending Physician: Darlin Drop [[id number]]          B Medical/Surgery History Past Medical History:  Diagnosis Date   Bipolar 1 disorder (HCC)    Diabetes mellitus    Fibromyalgia    Hypertension    Past Surgical History:  Procedure Laterality Date   ABDOMINAL HYSTERECTOMY  01/2019   BREAST BIOPSY  Right    CESAREAN SECTION     HERNIA REPAIR       A IV Location/Drains/Wounds Patient Lines/Drains/Airways Status     Active Line/Drains/Airways     Name Placement date Placement time Site Days   Peripheral IV 12/29/22 20 G Anterior;Right Forearm 12/29/22  0257  Forearm  less than 1            Intake/Output Last 24 hours No intake or output data in the 24 hours ending 12/29/22 1507  Labs/Imaging Results for orders placed or performed during the hospital encounter of 12/28/22 (from the past 48 hour(s))  Troponin I (High Sensitivity)     Status: Abnormal   Collection Time: 12/28/22  2:56 AM  Result Value Ref Range   Troponin I (High Sensitivity) 21 (H) <18 ng/L    Comment: (NOTE) Elevated high sensitivity troponin I (hsTnI) values and significant  changes across serial measurements may suggest ACS but many other  chronic and acute conditions are known to elevate hsTnI results.  Refer to the "Links" section for chest pain algorithms and additional  guidance. Performed at [facility], [address] [place], [place] [address]   CBG monitoring, ED     Status: Abnormal   Collection Time: 12/28/22  9:39 PM  Result Value Ref Range   Glucose-Capillary 398 (H) 70 - 99 mg/dL    Comment: Glucose reference range applies only to samples taken after fasting for at least 8 hours.  Basic metabolic panel     Status: Abnormal   Collection  Time: 12/28/22  9:40 PM  Result Value Ref Range   Sodium 131 (L) 135 - 145 mmol/L   Potassium 4.4 3.5 - 5.1 mmol/L   Chloride 98 98 - 111 mmol/L   CO2 24 22 - 32 mmol/L   Glucose, Bld 424 (H) 70 - 99 mg/dL    Comment: Glucose reference range applies only to samples taken after fasting for at least 8 hours.   BUN 22 (H) 6 - 20 mg/dL   Creatinine, Ser 1.61 0.44 - 1.00 mg/dL   Calcium 8.9 8.9 - 09.6 mg/dL   GFR, Estimated >04 >54 mL/min    Comment: (NOTE) Calculated using the CKD-EPI Creatinine Equation (2021)    Anion gap 9  5 - 15    Comment: Performed at [facility], [address] [place], [place] [address]  CBC     Status: None   Collection Time: 12/28/22  9:40 PM  Result Value Ref Range   WBC 5.4 4.0 - 10.5 K/uL   RBC 4.74 3.87 - 5.11 MIL/uL   Hemoglobin 14.8 12.0 - 15.0 g/dL   HCT 91.4 78.2 - 95.6 %   MCV 95.6 80.0 - 100.0 fL   MCH 31.2 26.0 - 34.0 pg   MCHC 32.7 30.0 - 36.0 g/dL   RDW 21.3 08.6 - 57.8 %   Platelets 204 150 - 400 K/uL   nRBC 0.0 0.0 - 0.2 %    Comment: Performed at [facility], [address], [place], [place] [address]  Troponin I (High Sensitivity)     Status: Abnormal   Collection Time: 12/28/22  9:40 PM  Result Value Ref Range   Troponin I (High Sensitivity) 23 (H) <18 ng/L    Comment: (NOTE) Elevated high sensitivity troponin I (hsTnI) values and significant  changes across serial measurements may suggest ACS but many other  chronic and acute conditions are known to elevate hsTnI results.  Refer to the "Links" section for chest pain algorithms and additional  guidance. Performed at [facility], [address] [place], [place] [address]   Lipid panel     Status: None   Collection Time: 12/28/22  9:40 PM  Result Value Ref Range   Cholesterol 134 0 - 200 mg/dL   Triglycerides 84 <132 mg/dL   HDL 50 >44 mg/dL   Total CHOL/HDL Ratio 2.7 RATIO   VLDL 17 0 - 40 mg/dL   LDL Cholesterol 67 0 - 99 mg/dL    Comment:        Total Cholesterol/HDL:CHD Risk Coronary Heart Disease Risk Table                     Men   Women  1/2 Average Risk   3.4   3.3  Average Risk       5.0   4.4  2 X Average Risk   9.6   7.1  3 X Average Risk  23.4   11.0        Use the calculated Patient Ratio above and the CHD Risk Table to determine the patient's CHD Risk.        ATP III CLASSIFICATION (LDL):  <100     mg/dL   Optimal  010-272  mg/dL   Near or Above                    Optimal  130-159  mg/dL   Borderline  536-644  mg/dL  High  >190     mg/dL   Very High Performed at [facility], [address] [place], [place] [address]   D-dimer, quantitative     Status: None   Collection Time: 12/29/22  2:56 AM  Result Value Ref Range   D-Dimer, Quant <0.27 0.00 - 0.50 ug/mL-FEU    Comment: (NOTE) At the manufacturer cut-off value of 0.5 g/mL FEU, this assay has a negative predictive value of 95-100%.This assay is intended for use in conjunction with a clinical pretest probability (PTP) assessment model to exclude pulmonary embolism (PE) and deep venous thrombosis (DVT) in outpatients suspected of PE or DVT. Results should be correlated with clinical presentation. Performed at [facility], [address] [place], [place] [address]   CBG monitoring, ED     Status: Abnormal   Collection Time: 12/29/22  3:10 AM  Result Value Ref Range   Glucose-Capillary 317 (H) 70 - 99 mg/dL    Comment: Glucose reference range applies only to samples taken after fasting for at least 8 hours.  CBG monitoring, ED     Status: Abnormal   Collection Time: 12/29/22  8:01 AM  Result Value Ref Range   Glucose-Capillary 304 (H) 70 - 99 mg/dL    Comment: Glucose reference range applies only to samples taken after fasting for at least 8 hours.  CBG monitoring, ED     Status: Abnormal   Collection Time: 12/29/22 10:41 AM  Result Value Ref Range   Glucose-Capillary 44 (LL) 70 - 99 mg/dL    Comment: Glucose reference range applies only to samples taken after fasting for at least 8 hours.   Comment 1 Notify RN   CBG monitoring, ED     Status: Abnormal   Collection Time: 12/29/22 10:49 AM  Result Value Ref Range   Glucose-Capillary 65 (L) 70 - 99 mg/dL    Comment: Glucose reference range applies only to samples taken after fasting for at least 8 hours.  CBG monitoring, ED     Status: None   Collection Time: 12/29/22 11:08 AM  Result Value Ref Range   Glucose-Capillary 80 70 - 99 mg/dL     Comment: Glucose reference range applies only to samples taken after fasting for at least 8 hours.  CBG monitoring, ED     Status: None   Collection Time: 12/29/22 12:02 PM  Result Value Ref Range   Glucose-Capillary 85 70 - 99 mg/dL    Comment: Glucose reference range applies only to samples taken after fasting for at least 8 hours.   DG Chest 2 View  Result Date: 12/28/2022 CLINICAL DATA:  Left-sided chest pain. EXAM: CHEST - 2 VIEW COMPARISON:  December 05, 2022 FINDINGS: The heart size and mediastinal contours are within normal limits. There is no evidence of an acute infiltrate, pleural effusion or pneumothorax. The visualized skeletal structures are unremarkable. IMPRESSION: No active cardiopulmonary disease. Electronically Signed   By: Aram Candela M.D.   On: 12/28/2022 22:20    Pending Labs Unresulted Labs (From admission, onward)     Start     Ordered   12/30/22 0500  CBC  Tomorrow morning,   R        12/29/22 0909   12/30/22 0500  Magnesium  Tomorrow morning,   R        12/29/22 0909   12/30/22 0500  Comprehensive metabolic panel  Tomorrow morning,   R        12/29/22 1337  Vitals/Pain Today's Vitals   12/29/22 1300 12/29/22 1335 12/29/22 1400 12/29/22 1500  BP: 120/81  102/66 132/66  Pulse: 80  77 72  Resp: 18   13  Temp:  97.9 F (36.6 C)    TempSrc:  Oral    SpO2: 98%  96% 91%  Weight:      Height:      PainSc:        Isolation Precautions No active isolations  Medications Medications  ketorolac (TORADOL) 30 MG/ML injection 15 mg (15 mg Intravenous Patient Refused/Not Given 12/29/22 0257)  pantoprazole (PROTONIX) EC tablet 40 mg (40 mg Oral Given 12/29/22 1353)  acetaminophen (TYLENOL) tablet 650 mg (has no administration in time range)  ondansetron (ZOFRAN) injection 4 mg (has no administration in time range)  enoxaparin (LOVENOX) injection 40 mg (40 mg Subcutaneous Given 12/29/22 1351)  melatonin tablet 5 mg (has no administration in time  range)  atorvastatin (LIPITOR) tablet 20 mg (20 mg Oral Patient Refused/Not Given 12/29/22 1355)  albuterol (PROVENTIL) (2.5 MG/3ML) 0.083% nebulizer solution 2.5 mg (has no administration in time range)  ketorolac (TORADOL) 15 MG/ML injection 15 mg (has no administration in time range)  oxyCODONE (Oxy IR/ROXICODONE) immediate release tablet 5 mg (has no administration in time range)  nitroGLYCERIN (NITROSTAT) SL tablet 0.4 mg (has no administration in time range)  lactated ringers infusion ( Intravenous New Bag/Given 12/29/22 1351)  losartan (COZAAR) tablet 50 mg (50 mg Oral Given 12/29/22 1353)  hydrALAZINE (APRESOLINE) injection 5 mg (has no administration in time range)  aspirin EC tablet 81 mg (81 mg Oral Given 12/29/22 1354)  umeclidinium bromide (INCRUSE ELLIPTA) 62.5 MCG/ACT 1 puff (has no administration in time range)  dextrose 50 % solution 25 g (25 g Intravenous Not Given 12/29/22 1315)  insulin aspart (novoLOG) injection 0-6 Units ( Subcutaneous Not Given 12/29/22 1315)  insulin glargine-yfgn (SEMGLEE) injection 5 Units (has no administration in time range)  gabapentin (NEURONTIN) capsule 100 mg (100 mg Oral Patient Refused/Not Given 12/29/22 1354)  aspirin chewable tablet 324 mg (324 mg Oral Given 12/29/22 0257)  insulin glargine-yfgn (SEMGLEE) injection 7 Units (7 Units Subcutaneous Given 12/29/22 0850)    Mobility walks with device     Focused Assessments     R Recommendations: See Admitting Provider Note  Report given to:   Additional Notes:

## 2022-12-29 NOTE — Progress Notes (Signed)
12/29/22 1300  SLP Visit Information  SLP Received On 12/29/22  Subjective  Subjective pt awakein bed, son Minerva Areola at bedside with his helper  Patient/Family Stated Goal for pt to be able to eat  General Information  Date of Onset 12/29/22  HPI Patient is a 57 year old female admitted to [facility] long hospital with transient left-sided chest pain.  Past medical history significant for bipolar disorder, hypertension, diabetes, PE and fibromyalgia.  Patient also endorses history of thyroid nodules.  Prior hospital admission for DKA required intubation in 2021, after which patient underwent a modified barium swallow study that showed normal function.  Patient endorses dysphagia stating she feels like it is difficult for her to swallow, feels like things stick in her throat.  Reports that she took a PPI previously but lost the medication so has not been taking it.  States that the PPI was helpful with her dysphagia however.  Type of Study Bedside Swallow Evaluation  Previous Swallow Assessment See HPI  Diet Prior to this Study NPO  Temperature Spikes Noted No  Respiratory Status Room air  History of Recent Intubation No  Behavior/Cognition Alert;Cooperative;Pleasant mood (Patient somewhat tearful)  Oral Cavity Assessment WFL  Oral Care Completed by SLP Yes (Patient brushed her own teeth with SLP set up assist)  Oral Cavity - Dentition Adequate natural dentition  Vision Functional for self-feeding  Self-Feeding Abilities Able to feed self  Patient Positioning Upright in bed  Baseline Vocal Quality Normal  Volitional Cough Strong  Volitional Swallow Able to elicit  Pain Assessment  Pain Assessment Faces  Pain Score 3  Pain Location Patient reports discomfort in her right arm.  RN made aware  Pain Descriptors / Indicators Pins and needles  Pain Intervention(s) Limited activity within patient's tolerance  Oral Assessment (Complete on admission/transfer/every shift)  Oral Assessment  (WDL) WDL   Lips Symmetrical  Teeth Intact;Other (Comment) (Some missing)  Tongue Pink  Mucous Membrane(s) Moist  Saliva Moist, saliva free flowing  Level of Consciousness Alert  Is patient on any of following O2 devices? None of the above  Nutritional status No high risk factors  Oral Assessment Risk  Low Risk  Oral Motor/Sensory Function  Overall Oral Motor/Sensory Function WFL  Ice Chips  Ice chips NT  Thin Liquid  Thin Liquid WFL  Nectar Thick Liquid  Nectar Thick Liquid NT  Honey Thick Liquid  Honey Thick Liquid NT  Puree  Puree WFL  Presentation Spoon;Self Fed  Solid  Solid WFL  Presentation Self Fed  SLP - End of Session  Patient left in bed;with call bell/phone within reach;with bed alarm set;with family/visitor present  Nurse Communication Aspiration precautions reviewed;Diet recommendation  Suspected Esophageal Findings  Suspected Esophageal Findings Belching  Individuals Consulted  Consulted and Agree with Results and Recommendations Patient;Family member/caregiver;RN  Family Member Consulted son, Minerva Areola  Progression Toward Goals  Progression toward goals Progressing toward goals  SLP Time Calculation  SLP Start Time (ACUTE ONLY) 1235  SLP Stop Time (ACUTE ONLY) 1300  SLP Time Calculation (min) (ACUTE ONLY) 25 min  SLP Evaluations  $ SLP Speech Visit 1 Visit  SLP Evaluations  $BSS Swallow 1 Procedure       12/29/22 1552  SLP Assessment  Clinical Impression Statement (ACUTE ONLY) Patient presents with baseline congested cough which she attributes to smoking.  She also reports fairly recent dental extraction bilateral and some discomfort with consumption.  Patient's articulation was clear and voice was strong.  She was able to self-feed  [employer] cookie, [place] ice, apple juice mixed with [place] ice and water.  Patient with some wincing reporting displeasure with the taste, but no indication of airway compromise.  She endorses some dysphagia stating she has a hard time  swallowing and feels like things are sticking in her pharynx which she attributes to her thyroid nodules.  Has history of reflux and took a PPI which she reported had improved her symptoms but she stopped taking it due to losing the medication, thus causing SLP to suspect her history of hiatal hernia and reflux may be primary source of her dysphagia symptoms.  Recommend regular thin diet.  Of note patient had prior modified barium swallow test in 2021 when she had DKA and was intubated for a few days.  That MBS study showed normal swallow.  Informed RN of recommendations and left swallow precautions signs in the room with the patient using teach back for precautions.  Given pt's report of dysphagia and h/o asp pna, will follow up briefly for po tolerance, indication for instrumental evaluation.  SLP Visit Diagnosis Dysphagia, unspecified (R13.10)  Impact on safety and function Mild aspiration risk  Other Related Risk Factors History of pneumonia;History of GERD;Decreased respiratory status  Swallow Evaluation Recommendations  SLP Diet Recommendations Regular;Thin liquid  Liquid Administration via Cup;Straw  Medication Administration Whole meds with liquid  Supervision Patient able to self feed  Compensations Slow rate;Small sips/bites  Postural Changes Seated upright at 90 degrees;Remain upright for at least 30 minutes after po intake  Treatment Plan  Oral Care Recommendations Oral care BID  Treatment Recommendations Therapy as outlined in treatment plan below  Follow Up Recommendations Follow physician's recommendations for discharge plan and follow up therapies  Functional Status Assessment Patient has had a recent decline in their functional status and demonstrates the ability to make significant improvements in function in a reasonable and predictable amount of time.  Speech Therapy Frequency (ACUTE ONLY) min 1 x/week  Treatment Duration 1 week  Interventions Aspiration precaution  training;Compensatory techniques;Patient/family education  Prognosis  Prognosis for improved oropharyngeal function Good   Rolena Infante, MS [facility] SLP Acute Rehab Services Office [phone]

## 2022-12-30 ENCOUNTER — Other Ambulatory Visit: Payer: 59

## 2022-12-30 DIAGNOSIS — R0789 Other chest pain: Secondary | ICD-10-CM | POA: Diagnosis not present

## 2022-12-30 LAB — GLUCOSE, CAPILLARY
Glucose-Capillary: 186 mg/dL — ABNORMAL HIGH (ref 70–99)
Glucose-Capillary: 195 mg/dL — ABNORMAL HIGH (ref 70–99)
Glucose-Capillary: 195 mg/dL — ABNORMAL HIGH (ref 70–99)
Glucose-Capillary: 253 mg/dL — ABNORMAL HIGH (ref 70–99)
Glucose-Capillary: 289 mg/dL — ABNORMAL HIGH (ref 70–99)
Glucose-Capillary: 361 mg/dL — ABNORMAL HIGH (ref 70–99)

## 2022-12-30 MED ORDER — SIMETHICONE 80 MG PO CHEW
80.0000 mg | CHEWABLE_TABLET | Freq: Four times a day (QID) | ORAL | Status: DC | PRN
  Administered 2022-12-30: 80 mg via ORAL
  Filled 2022-12-30: qty 1

## 2022-12-30 MED ORDER — GLUCERNA SHAKE PO LIQD
237.0000 mL | Freq: Three times a day (TID) | ORAL | Status: DC
  Administered 2022-12-30 – 2022-12-31 (×3): 237 mL via ORAL
  Filled 2022-12-30 (×5): qty 237

## 2022-12-30 NOTE — TOC Progression Note (Addendum)
Transition of Care [facility]) - Progression Note    Patient Details  Name: Courtney Bennett MRN: [id number] Date of Birth: [birth year]  Transition of Care [facility]) CM/SW Contact  Geni Bers, RN Phone Number: 12/30/2022, 3:50 PM  Clinical Narrative:    Spoke with pt concerning discharge, homeless shelters, and medications. Pt has been homeless for a year. Pt states that she has called all the homeless shelter and housing authority. Recommend pt to [facility] to [facility] for assistance. Homeless shelters added to AVS.         Expected Discharge Plan and Services                                               Social Determinants of Health (SDOH) Interventions SDOH Screenings   Food Insecurity: Food Insecurity Present (12/29/2022)  Housing: High Risk (12/29/2022)  Transportation Needs: Unmet Transportation Needs (12/29/2022)  Utilities: Not At Risk (12/29/2022)  Depression (PHQ2-9): Medium Risk (06/16/2019)  Tobacco Use: Medium Risk (12/28/2022)    Readmission Risk Interventions     No data to display

## 2022-12-30 NOTE — Evaluation (Signed)
Physical Therapy Evaluation Patient Details Name: Courtney Bennett MRN: [id number] DOB: 01-12-1966 Today's Date: 12/30/2022  History of Present Illness  57 yo female admitted with chest pain. Hx of DM, bipolar d/o, fibromyalgia. Pt is homeless  Clinical Impression  On eval, pt was Supv level for mobility. She walked ~100 feet with support of 2 hands on IV pole. Ambulation distance was limited by pt report of pain, dyspnea. She continues to report L side, L chest, L UE, L lower back pain. She reports she does not feel well. Pt will need to be able to mobilize with mod independence in order to safely d/c back into community since she reports being homeless. Will plan to follow pt during this hospital stay.        If plan is discharge home, recommend the following: Assist for transportation   Can travel by private vehicle        Equipment Recommendations [place] (pt reports she lost her cane.)  Recommendations for Other Services       Functional Status Assessment Patient has had a recent decline in their functional status and demonstrates the ability to make significant improvements in function in a reasonable and predictable amount of time.     Precautions / Restrictions Restrictions Weight Bearing Restrictions: No      Mobility  Bed Mobility Overal bed mobility: Modified Independent                  Transfers Overall transfer level: Modified independent                      Ambulation/Gait Ambulation/Gait assistance: Supervision Gait Distance (Feet): 100 Feet Assistive device: IV Pole (2 hands supprted on IV pole) Gait Pattern/deviations: Step-through pattern, Decreased stride length       General Gait Details: O2 94% RA. Pt reported continuing pain, dyspnea with activity.  Stairs            Wheelchair Mobility     Tilt Bed    Modified Rankin (Stroke Patients Only)       Balance Overall balance assessment: Mild deficits observed, not  formally tested                                           Pertinent Vitals/Pain Pain Assessment Pain Assessment: 0-10 Pain Score: 8  Pain Location: L side, L chest, L lower back, L UE Pain Descriptors / Indicators: Aching, Sharp Pain Intervention(s): Limited activity within patient's tolerance, Monitored during session, Repositioned    Home Living Family/patient expects to be discharged to:: Shelter/Homeless                        Prior Function Prior Level of Function : Independent/Modified Independent             Mobility Comments: pt reprots she had a cane but she left it at [employer]        Extremity/Trunk Assessment   Upper Extremity Assessment Upper Extremity Assessment: Overall WFL for tasks assessed    Lower Extremity Assessment Lower Extremity Assessment: Generalized weakness    Cervical / Trunk Assessment Cervical / Trunk Assessment: Normal  Communication   Communication: No difficulties  Cognition Arousal/Alertness: Awake/alert Behavior During Therapy: WFL for tasks assessed/performed Overall Cognitive Status: Within Functional Limits for tasks  assessed                                          General Comments      Exercises     Assessment/Plan    PT Assessment Patient needs continued PT services  PT Problem List Decreased activity tolerance;Decreased balance;Decreased mobility;Pain       PT Treatment Interventions DME instruction;Therapeutic exercise;Gait training;Balance training;Functional mobility training;Therapeutic activities;Patient/family education    PT Goals (Current goals can be found in the Care Plan section)  Acute Rehab PT Goals Patient Stated Goal: to feel better PT Goal Formulation: With patient Time For Goal Achievement: 01/13/23 Potential to Achieve Goals: Good    Frequency Min 1X/week     Co-evaluation               AM-PAC PT "6 Clicks"  Mobility  Outcome Measure Help needed turning from your back to your side while in a flat bed without using bedrails?: None Help needed moving from lying on your back to sitting on the side of a flat bed without using bedrails?: None Help needed moving to and from a bed to a chair (including a wheelchair)?: None Help needed standing up from a chair using your arms (e.g., wheelchair or bedside chair)?: None Help needed to walk in hospital room?: A Little Help needed climbing 3-5 steps with a railing? : A Little 6 Click Score: 22    End of Session   Activity Tolerance: Patient limited by pain;Patient limited by fatigue Patient left: in bed;with call bell/phone within reach;with bed alarm set   PT Visit Diagnosis: Pain;Difficulty in walking, not elsewhere classified (R26.2)    Time: 1203-1219 PT Time Calculation (min) (ACUTE ONLY): 16 min   Charges:   PT Evaluation $PT Eval Low Complexity: 1 Low   PT General Charges $$ ACUTE PT VISIT: 1 Visit            Faye Ramsay, PT Acute Rehabilitation  Office: [phone]
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
## 2022-12-30 NOTE — Progress Notes (Signed)
PROGRESS NOTE    Courtney Bennett  ZOX:[id number] DOB: 1965-06-22 DOA: 12/28/2022 PCP: Patient, No Pcp Per   Brief Narrative:  This 57 years old female with PMH significant for bipolar disorder, diabetes mellitus, hypertension, fibromyalgia presented in the ED with complaints of intermittent chest pain that started at 9 PM last night.She reports chest pain is left-sided and feels like something is sitting on her chest with radiation of pain to her upper arm and left back.  Her symptoms lasted for few seconds at a time and resolved spontaneously with frequent recurrence.  She also reported associated shortness of breath and nausea with episodes of chest pain.  Workup in the ED reveals troponin 23 > 41.  Chest pain is nonreproducible.Chest x-ray unremarkable.  Patient was given aspirin and Toradol and admitted for further evaluation.  Assessment & Plan:   Principal Problem:   Atypical chest pain Active Problems:   COPD (chronic obstructive pulmonary disease) (HCC)   Homeless   DM (diabetes mellitus) type 2, uncontrolled, with ketoacidosis (HCC)   Essential hypertension  Atypical chest pain: Patient presented with atypical chest pain, intermittent. Troponin 23 > 21 Continue aspirin, Lipitor. Hemoglobin A1c 12.0 on 7/1/ 24. Continue sublingual nitro.   Case discussed with cardiologist Dr. Excell Seltzer who recommended echo.  If echo is normal,  no ischemic workup needed.   Diabetes mellitus type 2 uncontrolled: Patient endorses polydipsia.  Hemoglobin A1c 12.0. Patient had hypoglycemic event in the morning after receiving 7 units Semglee. Reduce Semglee to 5U daily Sensitive SSI q4H CBG monitoring while NPO LR at 75 mL/hr while NPO   Hyperlipidemia: Continue home Atorvastatin Lipid panel   Essential Hypertension: Prescribed Losartan, been non-compliant. PRN hydralazine while NPO   COPD: Stable. Continue home Incruse Ellipta PRN nebulizer while in hospital   Bipolar Affective  Disorder Does not appear to be on outpatient medication.   DVT prophylaxis: Lovenox Code Status: Full code Family Communication: No family at bed side. Disposition Plan:   Status is: Observation The patient remains OBS appropriate and will d/c before 2 midnights.   Admitted for atypical chest pain.  Echo is within normal limits.  Troponin flat.  Patient is medically clear but patient is homeless requires placement.  TOC notified.  Consultants:  Cardiology  Procedures: Echocardiogram.  Antimicrobials:  Anti-infectives (From admission, onward)    None      Subjective: Patient was seen and examined at bedside.  Overnight events noted.   Patient reports doing much better.  She still reports having left-sided chest pain, States she is homeless and has no place to live.  Objective: Vitals:   12/29/22 2315 12/30/22 0806 12/30/22 0831 12/30/22 1223  BP: 122/70 (!) 152/84  (!) 151/77  Pulse: 70 62  70  Resp: 17 16  16   Temp: 98.1 F (36.7 C) 97.7 F (36.5 C)  98.3 F (36.8 C)  TempSrc: Oral Oral  Oral  SpO2: 94% 94% 95% 95%  Weight:      Height:        Intake/Output Summary (Last 24 hours) at 12/30/2022 1314 Last data filed at 12/30/2022 1210 Gross per 24 hour  Intake 1963.76 ml  Output 900 ml  Net 1063.76 ml   Filed Weights   12/28/22 2131 12/29/22 1600  Weight: 48.5 kg 44 kg    Examination:  General exam: Appears calm and comfortable, deconditioned, not in any distress. Respiratory system: CTA bilaterally. Respiratory effort normal. RR 15 Cardiovascular system: S1 & S2 heard, RRR. No JVD,  murmurs, rubs, gallops or clicks.  Gastrointestinal system: Abdomen is non distended, soft and nontender. Normal bowel sounds heard. Central nervous system: Alert and oriented x 3. No focal neurological deficits. Extremities: No edema, no cyanosis, no clubbing Skin: No rashes, lesions or ulcers Psychiatry: Judgement and insight appear normal. Mood & affect appropriate.      Data Reviewed: I have personally reviewed following labs and imaging studies  CBC: Recent Labs  Lab 12/28/22 2140 12/30/22 0515  WBC 5.4 6.1  HGB 14.8 14.9  HCT 45.3 46.1*  MCV 95.6 96.4  PLT 204 187   Basic Metabolic Panel: Recent Labs  Lab 12/28/22 2140 12/30/22 0515  NA 131* 135  K 4.4 4.1  CL 98 101  CO2 24 26  GLUCOSE 424* 233*  BUN 22* 24*  CREATININE 0.73 0.52  CALCIUM 8.9 8.3*  MG  --  1.9   GFR: Estimated Creatinine Clearance: 54.5 mL/min (by C-G formula based on SCr of 0.52 mg/dL). Liver Function Tests: Recent Labs  Lab 12/30/22 0515  AST 13*  ALT 14  ALKPHOS 67  BILITOT 0.3  PROT 5.5*  ALBUMIN 3.1*   No results for input(s): "LIPASE", "AMYLASE" in the last 168 hours. No results for input(s): "AMMONIA" in the last 168 hours. Coagulation Profile: No results for input(s): "INR", "PROTIME" in the last 168 hours. Cardiac Enzymes: No results for input(s): "CKTOTAL", "CKMB", "CKMBINDEX", "TROPONINI" in the last 168 hours. BNP (last 3 results) No results for input(s): "PROBNP" in the last 8760 hours. HbA1C: No results for input(s): "HGBA1C" in the last 72 hours. CBG: Recent Labs  Lab 12/29/22 2035 12/29/22 2317 12/30/22 0415 12/30/22 0743 12/30/22 1153  GLUCAP 208* 127* 195* 186* 361*   Lipid Profile: Recent Labs    12/28/22 2140  CHOL 134  HDL 50  LDLCALC 67  TRIG 84  CHOLHDL 2.7   Thyroid Function Tests: No results for input(s): "TSH", "T4TOTAL", "FREET4", "T3FREE", "THYROIDAB" in the last 72 hours. Anemia Panel: No results for input(s): "VITAMINB12", "FOLATE", "FERRITIN", "TIBC", "IRON", "RETICCTPCT" in the last 72 hours. Sepsis Labs: No results for input(s): "PROCALCITON", "LATICACIDVEN" in the last 168 hours.  Recent Results (from the past 240 hour(s))  MRSA Next Gen by PCR, Nasal     Status: None   Collection Time: 12/29/22  5:41 PM   Specimen: Nasal Mucosa; Nasal Swab  Result Value Ref Range Status   MRSA by PCR Next  Gen NOT DETECTED NOT DETECTED Final    Comment: (NOTE) The GeneXpert MRSA Assay (FDA approved for NASAL specimens only), is one component of a comprehensive MRSA colonization surveillance program. It is not intended to diagnose MRSA infection nor to guide or monitor treatment for MRSA infections. Test performance is not FDA approved in patients less than 84 years old. Performed at [facility], [address], [place], [place] [address]     Radiology Studies: ECHOCARDIOGRAM COMPLETE  Result Date: 12/29/2022    ECHOCARDIOGRAM REPORT   Patient Name:   DOMINI DHILLON Date of Exam: 12/29/2022 Medical Rec #:  [id number]       Height:       63.0 in Accession #:    [id number]      Weight:       107.0 lb Date of Birth:  April 21, 1966       BSA:          1.482 m Patient Age:    56 years        BP:  102/66 mmHg Patient Gender: F               HR:           64 bpm. Exam Location:  Inpatient Procedure: 2D Echo, Cardiac Doppler and Color Doppler Indications:    Chest Pain  History:        Patient has prior history of Echocardiogram examinations, most                 recent 12/12/2019. Risk Factors:Hypertension and Diabetes.  Sonographer:    Harriette Bouillon RDCS Referring Phys: [id number] KATY L FOUST IMPRESSIONS  1. Left ventricular ejection fraction, by estimation, is 60 to 65%. The left ventricle has normal function. The left ventricle has no regional wall motion abnormalities. There is mild concentric left ventricular hypertrophy. Left ventricular diastolic parameters are consistent with Grade I diastolic dysfunction (impaired relaxation).  2. Right ventricular systolic function is normal. The right ventricular size is normal. Tricuspid regurgitation signal is inadequate for assessing PA pressure.  3. Left atrial size was mildly dilated.  4. The mitral valve is normal in structure. No evidence of mitral valve regurgitation. No evidence of mitral stenosis.  5. The aortic valve is tricuspid.  Aortic valve regurgitation is not visualized. No aortic stenosis is present.  6. The inferior vena cava is normal in size with <50% respiratory variability, suggesting right atrial pressure of 8 mmHg. FINDINGS  Left Ventricle: Left ventricular ejection fraction, by estimation, is 60 to 65%. The left ventricle has normal function. The left ventricle has no regional wall motion abnormalities. The left ventricular internal cavity size was normal in size. There is  mild concentric left ventricular hypertrophy. Left ventricular diastolic parameters are consistent with Grade I diastolic dysfunction (impaired relaxation). Right Ventricle: The right ventricular size is normal. No increase in right ventricular wall thickness. Right ventricular systolic function is normal. Tricuspid regurgitation signal is inadequate for assessing PA pressure. Left Atrium: Left atrial size was mildly dilated. Right Atrium: Right atrial size was normal in size. Pericardium: There is no evidence of pericardial effusion. Mitral Valve: The mitral valve is normal in structure. No evidence of mitral valve regurgitation. No evidence of mitral valve stenosis. Tricuspid Valve: The tricuspid valve is normal in structure. Tricuspid valve regurgitation is not demonstrated. Aortic Valve: The aortic valve is tricuspid. Aortic valve regurgitation is not visualized. No aortic stenosis is present. Pulmonic Valve: The pulmonic valve was normal in structure. Pulmonic valve regurgitation is not visualized. Aorta: The aortic root is normal in size and structure. Venous: The inferior vena cava is normal in size with less than 50% respiratory variability, suggesting right atrial pressure of 8 mmHg. IAS/Shunts: No atrial level shunt detected by color flow Doppler.  LEFT VENTRICLE PLAX 2D LVIDd:         4.20 cm   Diastology LVIDs:         2.80 cm   LV e' medial:    6.31 cm/s LV PW:         1.00 cm   LV E/e' medial:  12.1 LV IVS:        1.00 cm   LV e' lateral:   8.49  cm/s LVOT diam:     1.90 cm   LV E/e' lateral: 9.0 LV SV:         56 LV SV Index:   38 LVOT Area:     2.84 cm  RIGHT VENTRICLE  IVC RV S prime:     7.51 cm/s  IVC diam: 2.00 cm TAPSE (M-mode): 1.3 cm LEFT ATRIUM           Index        RIGHT ATRIUM          Index LA diam:      3.80 cm 2.56 cm/m   RA Area:     7.08 cm LA Vol (A2C): 33.0 ml 22.26 ml/m  RA Volume:   13.00 ml 8.77 ml/m LA Vol (A4C): 45.5 ml 30.70 ml/m  AORTIC VALVE LVOT Vmax:   122.00 cm/s LVOT Vmean:  75.300 cm/s LVOT VTI:    0.198 m  AORTA Ao Root diam: 3.00 cm Ao Asc diam:  2.50 cm MITRAL VALVE MV Area (PHT): 3.65 cm    SHUNTS MV Decel Time: 208 msec    Systemic VTI:  0.20 m MV E velocity: 76.30 cm/s  Systemic Diam: 1.90 cm MV A velocity: 70.30 cm/s MV E/A ratio:  1.09 Dalton McleanMD Electronically signed by Wilfred Lacy Signature Date/Time: 12/29/2022/6:02:37 PM    Final    DG Chest 2 View  Result Date: 12/28/2022 CLINICAL DATA:  Left-sided chest pain. EXAM: CHEST - 2 VIEW COMPARISON:  December 05, 2022 FINDINGS: The heart size and mediastinal contours are within normal limits. There is no evidence of an acute infiltrate, pleural effusion or pneumothorax. The visualized skeletal structures are unremarkable. IMPRESSION: No active cardiopulmonary disease. Electronically Signed   By: Aram Candela M.D.   On: 12/28/2022 22:20    Scheduled Meds:  aspirin EC  81 mg Oral Daily   atorvastatin  20 mg Oral Daily   enoxaparin (LOVENOX) injection  40 mg Subcutaneous Q24H   feeding supplement (GLUCERNA SHAKE)  237 mL Oral TID BM   gabapentin  100 mg Oral BID   insulin aspart  0-6 Units Subcutaneous Q4H   insulin glargine-yfgn  5 Units Subcutaneous Daily   ketorolac  15 mg Intravenous Once   losartan  50 mg Oral Daily   pantoprazole  40 mg Oral BID   umeclidinium bromide  1 puff Inhalation Daily   Continuous Infusions:  lactated ringers 75 mL/hr at 12/30/22 1000     LOS: 0 days    Time spent: 50 Mins    Willeen Niece, MD [facility]   If 7PM-7AM, please contact night-coverage

## 2022-12-30 NOTE — Plan of Care (Signed)
  Problem: Education: Goal: Ability to describe self-care measures that may prevent or decrease complications (Diabetes Survival Skills Education) will improve Outcome: Adequate for Discharge Goal: Individualized Educational Video(s) Outcome: Not Applicable   Problem: Coping: Goal: Ability to adjust to condition or change in health will improve Outcome: Progressing   Problem: Fluid Volume: Goal: Ability to maintain a balanced intake and output will improve Outcome: Adequate for Discharge   Problem: Health Behavior/Discharge Planning: Goal: Ability to identify and utilize available resources and services will improve Outcome: Progressing Goal: Ability to manage health-related needs will improve Outcome: Progressing   Problem: Metabolic: Goal: Ability to maintain appropriate glucose levels will improve Outcome: Progressing   Problem: Nutritional: Goal: Maintenance of adequate nutrition will improve Outcome: Progressing Goal: Progress toward achieving an optimal weight will improve Outcome: Progressing   Problem: Skin Integrity: Goal: Risk for impaired skin integrity will decrease Outcome: Progressing   Problem: Tissue Perfusion: Goal: Adequacy of tissue perfusion will improve Outcome: Progressing   Problem: Education: Goal: Understanding of cardiac disease, CV risk reduction, and recovery process will improve Outcome: Progressing Goal: Individualized Educational Video(s) Outcome: Not Applicable   Problem: Activity: Goal: Ability to tolerate increased activity will improve Outcome: Progressing   Problem: Cardiac: Goal: Ability to achieve and maintain adequate cardiovascular perfusion will improve Outcome: Progressing   Problem: Health Behavior/Discharge Planning: Goal: Ability to safely manage health-related needs after discharge will improve Outcome: Progressing   Problem: Education: Goal: Knowledge of General Education information will improve Description:  Including pain rating scale, medication(s)/side effects and non-pharmacologic comfort measures Outcome: Progressing   Problem: Health Behavior/Discharge Planning: Goal: Ability to manage health-related needs will improve Outcome: Progressing   Problem: Clinical Measurements: Goal: Ability to maintain clinical measurements within normal limits will improve Outcome: Progressing Goal: Will remain free from infection Outcome: Progressing Goal: Diagnostic test results will improve Outcome: Progressing Goal: Respiratory complications will improve Outcome: Progressing Goal: Cardiovascular complication will be avoided Outcome: Progressing   Problem: Nutrition: Goal: Adequate nutrition will be maintained Outcome: Progressing   Problem: Coping: Goal: Level of anxiety will decrease Outcome: Progressing   Problem: Elimination: Goal: Will not experience complications related to bowel motility Outcome: Progressing Goal: Will not experience complications related to urinary retention Outcome: Progressing   Problem: Pain Managment: Goal: General experience of comfort will improve Outcome: Progressing   Problem: Safety: Goal: Ability to remain free from injury will improve Outcome: Progressing   Problem: Skin Integrity: Goal: Risk for impaired skin integrity will decrease Outcome: Progressing

## 2022-12-30 NOTE — Telephone Encounter (Signed)
Lmom for pt to call back to schedule picking up diabetic shoes   

## 2022-12-30 NOTE — Inpatient Diabetes Management (Signed)
Inpatient Diabetes Program Recommendations  AACE/ADA: New Consensus Statement on Inpatient Glycemic Control (2015)  Target Ranges:  Prepandial:   less than 140 mg/dL      Peak postprandial:   less than 180 mg/dL (1-2 hours)      Critically ill patients:  140 - 180 mg/dL   Lab Results  Component Value Date   GLUCAP 361 (H) 12/30/2022   HGBA1C 12.0 (H) 11/30/2022    Review of Glycemic Control  Latest Reference Range & Units 12/30/22 07:43 12/30/22 11:53  Glucose-Capillary 70 - 99 mg/dL 161 (H) 096 (H)  (H): Data is abnormally high  Diabetes history: DM2 Outpatient Diabetes medications:  Lantus 14 units every day Humalog 8 units TID and SSI Rybelsus 7 mg QD Current orders for Inpatient glycemic control:  Semglee 5 every day Novolog 0-6 units Q4H  Inpatient Diabetes Program Recommendations:    Novolog 0-6 units TID and 0-5 units at bedtime Novolog 3 units TID with meals if she consumes at least 50%  Will continue to follow while inpatient.  Thank you, Dulce Sellar, MSN, CDCES Diabetes Coordinator Inpatient Diabetes Program [phone] (team pager from 8a-5p)

## 2022-12-31 DIAGNOSIS — R0789 Other chest pain: Secondary | ICD-10-CM | POA: Diagnosis not present

## 2022-12-31 DIAGNOSIS — E43 Unspecified severe protein-calorie malnutrition: Secondary | ICD-10-CM | POA: Insufficient documentation

## 2022-12-31 LAB — GLUCOSE, CAPILLARY
Glucose-Capillary: 268 mg/dL — ABNORMAL HIGH (ref 70–99)
Glucose-Capillary: 248 mg/dL — ABNORMAL HIGH (ref 70–99)

## 2022-12-31 MED ORDER — NITROGLYCERIN 0.4 MG SL SUBL: 0.4000 mg | SUBLINGUAL_TABLET | SUBLINGUAL | 0 refills | Status: DC | PRN

## 2022-12-31 MED ORDER — GABAPENTIN 100 MG PO CAPS: 100.0000 mg | ORAL_CAPSULE | Freq: Two times a day (BID) | ORAL | 1 refills | Status: DC

## 2022-12-31 MED ORDER — ASPIRIN 81 MG PO TBEC: 81.0000 mg | DELAYED_RELEASE_TABLET | Freq: Every day | ORAL | 12 refills | Status: DC

## 2022-12-31 MED ORDER — PANTOPRAZOLE SODIUM 40 MG PO TBEC: 40.0000 mg | DELAYED_RELEASE_TABLET | Freq: Two times a day (BID) | ORAL | 0 refills | Status: DC

## 2022-12-31 MED ORDER — ENSURE MAX PROTEIN PO LIQD
11.0000 [oz_av] | Freq: Two times a day (BID) | ORAL | Status: DC
  Administered 2022-12-31: 11 [oz_av] via ORAL
  Filled 2022-12-31 (×2): qty 330

## 2022-12-31 NOTE — Progress Notes (Signed)
Initial Nutrition Assessment  DOCUMENTATION CODES:   Severe malnutrition in context of chronic illness, Underweight  INTERVENTION:   -Ensure MAX Protein po BID, each supplement provides 150 kcal and 30 grams of protein   -Provided Ensure coupon booklet  NUTRITION DIAGNOSIS:   Severe Malnutrition related to social / environmental circumstances as evidenced by severe fat depletion, severe muscle depletion, energy intake < or equal to 75% for > or equal to 1 month.  GOAL:   Patient will meet greater than or equal to 90% of their needs  MONITOR:   PO intake, Supplement acceptance, Labs, Weight trends, I & O's  REASON FOR ASSESSMENT:   Malnutrition Screening Tool    ASSESSMENT:   57 years old female with PMH significant for bipolar disorder, diabetes mellitus, hypertension, fibromyalgia presented in the ED with complaints of intermittent chest pain that started at 9 PM last night.She reports chest pain is left-sided and feels like something is sitting on her chest with radiation of pain to her upper arm and left back.  Patient in room, eating lunch. Completed 100% of the meal. Reports good appetite. Pt is homeless. Tries to drink Premier Protein if she can afford it. She does not like the Glucerna shakes, RD ordered Ensure Max instead. Provided coupon booklet as well for Ensure to help with cost.  Pt reports some issues with swallowing, gets choked up with food, did a bit while RD was in room.  Pt knows about diabetes diet, is aware of foods that increase blood sugar. Does her best given her social situation.   Per weight records, weight is trending down since 7/1.   Medications reviewed.  Labs reviewed: CBGs: 186-361   NUTRITION - FOCUSED PHYSICAL EXAM:  Flowsheet Row Most Recent Value  Orbital Region Severe depletion  Upper Arm Region Severe depletion  Thoracic and Lumbar Region Severe depletion  Buccal Region Moderate depletion  Temple Region Moderate depletion   Clavicle Bone Region Severe depletion  Clavicle and Acromion Bone Region Severe depletion  Scapular Bone Region Severe depletion  Dorsal Hand Severe depletion  Patellar Region Severe depletion  Anterior Thigh Region Severe depletion  Posterior Calf Region Severe depletion  Edema (RD Assessment) None  Hair Reviewed  Eyes Reviewed  Mouth Reviewed  [missing teeth, ill fitting partial]  Skin Reviewed  [dry, flaky]  Nails Reviewed       Diet Order:   Diet Order             Diet - low sodium heart healthy           Diet Carb Modified           Diet regular Room service appropriate? Yes with Assist; Fluid consistency: Thin  Diet effective now                   EDUCATION NEEDS:   Education needs have been addressed  Skin:  Skin Assessment: Reviewed RN Assessment  Last BM:  7/31 -type 4  Height:   Ht Readings from Last 1 Encounters:  12/29/22 5\' 3"  (1.6 m)    Weight:   Wt Readings from Last 1 Encounters:  12/29/22 44 kg    BMI:  Body mass index is 17.18 kg/m.  Estimated Nutritional Needs:   Kcal:  1600-1800  Protein:  75-90g  Fluid:  1.8L/day   Tilda Franco, MS, RD, LDN Inpatient Clinical Dietitian Contact information available via Amion

## 2022-12-31 NOTE — Discharge Instructions (Signed)
Advised to follow-up with primary care physician in 1 week. Advised to continue current medications as prescribed.   Patient was provided resources for homeless shelters.

## 2022-12-31 NOTE — Patient Instructions (Signed)
Community Resources *Call 211 to get information on social services in [place] area*  *Chief Technology Officer App*   Night Resources: [employer]: [phone] ext [phone] or ext [phone]] Address: [address] [place], [place]  [employer] Access to case management  [facility]: [phone] Temporary housing for families searching for other housing options Case management   [employer] [facility]:  [phone] Shelter for women and their children Daily meals Case management Daycare Day Resources: [facility] Strategy [facility]): [phone] Address: [address] Case management Medical clinic for uninsured Environmental consultant and Systems developer (laundry) Phone and mail access Replacement IDs [employer] lockers [employer] winter warming center  [facility]: [phone] Address: [address] [place], [place] Open Monday through Friday 8am-4:30pm Breakfast every Saturday 8:00am-9:00am Cooling/warming shelter (based on weather) Household and wellness products [facility] Case Education administrator *Call 211 to get information on food pantry locations in [place] area*  *Greater Dietitian App*   [facility] [employer] [facility]: [phone] ext [phone]] Open-door-policy (everyone is welcome, without question or qualification) Lunch everyday @ 10:30 (including holidays) Breakfast and dinner for [employer] guests  [facility] [employer] [facility]:  [phone] "Choice pantry", guests get to choose foods Emergency groceries Monday-Friday 9:00am-3:00pm   -Guests can come once every seven days

## 2022-12-31 NOTE — Progress Notes (Signed)
Mobility Specialist - Progress Note   12/31/22 1037  Mobility  Activity Ambulated with assistance in hallway  Level of Assistance Standby assist, set-up cues, supervision of patient - no hands on  Assistive Device Other (Comment) (Hallway Rails)  Distance Ambulated (ft) 250 ft  Range of Motion/Exercises Active  Activity Response Tolerated well  Mobility Referral Yes  $Mobility charge 1 Mobility  Mobility Specialist Start Time (ACUTE ONLY) 1020  Mobility Specialist Stop Time (ACUTE ONLY) 1037  Mobility Specialist Time Calculation (min) (ACUTE ONLY) 17 min   Pt was found in bed and agreeable to ambulate. C/o of back pain during session which limited her distance. At EOS returned recliner chair with all needs met. Call bell in reach and RN notified.  Billey Chang Mobility Specialist

## 2022-12-31 NOTE — Progress Notes (Signed)
Pt was given and explained discharge instructions. Pt provided clothing from the chaplain. PT provided bus pass and resources for shelters. IV removed. Tele removed. Pt dressed in clothing provided. Pt ambulated to main entrance with staff for d/c.

## 2022-12-31 NOTE — Progress Notes (Signed)
Chaplain provided [place] with clothes from our clothing closet.     12/31/22 1500  Spiritual Encounters  Type of Visit Follow up  Reason for visit Routine spiritual support

## 2022-12-31 NOTE — Evaluation (Signed)
Occupational Therapy Evaluation Patient Details Name: LENORAH SOBIE MRN: [id number] DOB: [birth year] Today's Date: 12/31/2022   History of Present Illness 57 yo female admitted with chest pain. Hx of DM, bipolar d/o, fibromyalgia. Pt is homeless   Clinical Impression   Patient evaluated by Occupational Therapy with no further acute OT needs identified. All education has been completed and the patient has no further questions. Pt has been provided with shelter resources and is aware of additional online resources for meals/food ([employer]) although information have not been recently updated and not always reliable. Pt provided with additional resource handout and was able to confirm that she has been in contact with recourses provided and they have not been able to provide assistance. OT will contact chaplain and Social Work to see if there are any additional resources for transportation and/or assist with taking her insulin when medically necessary.  OT is signing off. Thank you for this referral.       Recommendations for follow up therapy are one component of a multi-disciplinary discharge planning process, led by the attending physician.  Recommendations may be updated based on patient status, additional functional criteria and insurance authorization.   Assistance Recommended at Discharge Other (comment) (housing/food/transportation assistance and resources)     Functional Status Assessment  Patient has not had a recent decline in their functional status  Equipment Recommendations  None recommended by OT    Recommendations for Other Services       Precautions / Restrictions Precautions Precautions: None Restrictions Weight Bearing Restrictions: No      Mobility Bed Mobility Overal bed mobility:  (up in recliner upon therapy arrival.)     Transfers Overall transfer level: Independent Equipment used: None         Balance Overall balance  assessment: Mild deficits observed, not formally tested          ADL either performed or assessed with clinical judgement   ADL Overall ADL's : Modified independent           Vision Baseline Vision/History: 0 No visual deficits Ability to See in Adequate Light: 0 Adequate              Pertinent Vitals/Pain Pain Assessment Pain Assessment: Faces Faces Pain Scale: No hurt     Hand Dominance Right   Extremity/Trunk Assessment Upper Extremity Assessment Upper Extremity Assessment: Generalized weakness   Lower Extremity Assessment Lower Extremity Assessment: Generalized weakness   Cervical / Trunk Assessment Cervical / Trunk Assessment: Normal   Communication Communication Communication: No difficulties   Cognition Arousal/Alertness: Awake/alert Behavior During Therapy: Flat affect Overall Cognitive Status: Within Functional Limits for tasks assessed       General Comments: tearful during session regarding her situation.                Home Living Family/patient expects to be discharged to:: Shelter/Homeless        Additional Comments: Per chart review, pt was staying in her car until it broke down.      Prior Functioning/Environment Prior Level of Function : Independent/Modified Independent  Mobility Comments: Did have a cane although left it at [place] ADLs Comments: Independent. Reports limited resources d/t being homeless and does not have access to consistent housing/shelter.        OT Problem List: Decreased activity tolerance;Decreased strength         OT Goals(Current goals can be found in the care plan section) Acute Rehab OT Goals  Patient Stated Goal: to get help with her medical issues and lack of housing  OT Frequency:  1X visit       AM-PAC OT "6 Clicks" Daily Activity     Outcome Measure Help from another person eating meals?: None Help from another person taking care of personal grooming?: None Help from another person  toileting, which includes using toliet, bedpan, or urinal?: None Help from another person bathing (including washing, rinsing, drying)?: None Help from another person to put on and taking off regular upper body clothing?: None Help from another person to put on and taking off regular lower body clothing?: None 6 Click Score: 24   End of Session    Activity Tolerance: Patient tolerated treatment well Patient left: in bed;with call bell/phone within reach  OT Visit Diagnosis: Muscle weakness (generalized) (M62.81)                Time: 1031-1050 OT Time Calculation (min): 19 min Charges:  OT General Charges $OT Visit: 1 Visit OT Evaluation $OT Eval Low Complexity: 1 Low  Limmie Patricia, OTR/L,CBIS  Supplemental OT - [facility] and [facility] Secure Chat Preferred    Djuna Frechette, Charisse March 12/31/2022, 11:03 AM

## 2022-12-31 NOTE — Discharge Summary (Addendum)
Physician Discharge Summary  Courtney Bennett:[id number] DOB: 1966-01-16 DOA: 12/28/2022  PCP: Patient, No Pcp Per  Admit date: 12/28/2022  Discharge date: 12/31/2022  Admitted From: Home. Disposition:  Home less shelter  Recommendations for Outpatient Follow-up:  Follow up with PCP in 1-2 weeks. Please obtain BMP/CBC in one week. Advised to continue current medications as prescribed.   Patient was provided resources for homeless shelters.  Home Health:None Equipment/Devices:None  Discharge Condition: Stable CODE STATUS: Full code Diet recommendation: Heart Healthy   Brief [facility] Course: This 57 years old female with PMH significant for bipolar disorder, diabetes mellitus, hypertension, fibromyalgia presented in the ED with complaints of intermittent chest pain that started at 9 PM last night. She reports chest pain is left-sided and feels like something is sitting on her chest with radiation of pain to her upper arm and left back.  Her symptoms lasted for few seconds at a time and resolved spontaneously with frequent recurrence.  She also reported associated shortness of breath and nausea with episodes of chest pain.  Workup in the ED reveals troponin 23 > 41.  Chest pain is nonreproducible. Chest x-ray unremarkable.  Patient was given aspirin and Toradol and admitted for further evaluation.  Case was discussed with cardiologist Dr. Excell Seltzer who recommended echocardiogram.  If echo is normal , She does not need any ischemic workup.  Chest pain appears atypical.  Echocardiogram completed LVEF 60- 65%, no RWMA.  Patient feels better, chest pain has resolved.  Patient was provided resources for homeless shelters.  She felt better,  wants to be discharged.  Patient being discharged home.  Discharge Diagnoses:  Principal Problem:   Atypical chest pain Active Problems:   COPD (chronic obstructive pulmonary disease) (HCC)   Homeless   DM (diabetes mellitus) type 2, uncontrolled,  with ketoacidosis (HCC)   Essential hypertension   Protein-calorie malnutrition, severe  Atypical chest pain: Patient presented with atypical chest pain, intermittent. Troponin 23 > 21 Continue aspirin, Lipitor. Hemoglobin A1c 12.0 on 7/1/ 24. Continue sublingual nitro.   Case discussed with cardiologist Dr. Excell Seltzer who recommended echo.  If echo is normal,  no ischemic workup needed. Echo unremarkable.  LVEF 60 to 65%, no WMA.   Diabetes mellitus type 2 uncontrolled: Patient endorses polydipsia.  Hemoglobin A1c 12.0. Patient had hypoglycemic event in the morning after receiving 7 units Semglee. Reduce Semglee to 5U daily Sensitive SSI Diabetic education completed.  Hyperlipidemia: Continue home Atorvastatin   Essential Hypertension: Prescribed Losartan, been non-compliant.   COPD: Stable. Continue home Incruse Ellipta PRN nebulizer while in hospital   Bipolar Affective Disorder Does not appear to be on outpatient medication.  Discharge Instructions  Discharge Instructions     Call MD for:  difficulty breathing, headache or visual disturbances   Complete by: As directed    Call MD for:  persistant dizziness or light-headedness   Complete by: As directed    Call MD for:  persistant nausea and vomiting   Complete by: As directed    Diet - low sodium heart healthy   Complete by: As directed    Diet Carb Modified   Complete by: As directed    Discharge instructions   Complete by: As directed    Advised to follow-up with primary care physician in 1 week. Advised to continue current medications as prescribed.   Patient was provided resources for homeless shelters.   Increase activity slowly   Complete by: As directed       Allergies  as of 12/31/2022       Reactions   Nickel Rash   Other Reaction(s): Unknown Rash after ear piercing and navel piercings   Oxycodone-acetaminophen Nausea And Vomiting   Metformin Diarrhea   Elemental Sulfur Hives, Rash, Other (See  Comments)   "Burns the skin," also   Lamictal [lamotrigine] Rash   Latex Itching, Rash   Sulfa Antibiotics Rash   Sulfur Hives, Rash   Other Reaction(s): Other "Burns the skin," also   Tape Rash, Other (See Comments)   Irritates the skin        Medication List     STOP taking these medications    atorvastatin 20 MG tablet Commonly known as: LIPITOR   Lantus SoloStar 100 UNIT/ML Solostar Pen Generic drug: insulin glargine   sertraline 100 MG tablet Commonly known as: ZOLOFT       TAKE these medications    Accu-Chek Guide test strip Generic drug: glucose blood Use twice daily   aspirin EC 81 MG tablet Take 1 tablet (81 mg total) by mouth daily. Swallow whole. Start taking on: January 01, 2023   BD Pen Needle Nano U/F 32G X 4 MM Misc Generic drug: Insulin Pen Needle Use 2 times per day   blood glucose meter kit and supplies Dispense based on patient and insurance preference. Use up to four times daily as directed. (FOR ICD-10 E10.9, E11.9).   estrogens (conjugated) 0.3 MG tablet Commonly known as: Premarin Take 1 tablet (0.3 mg total) by mouth daily.   FreeStyle Libre 14 Day Sensor Misc 1 application by Subdermal route See admin instructions. Check blood glucose 1-2 times daily. E11.65   gabapentin 100 MG capsule Commonly known as: NEURONTIN Take 1 capsule (100 mg total) by mouth 2 (two) times daily.   HumaLOG KwikPen 200 UNIT/ML KwikPen Generic drug: insulin lispro Inject 8 Units into the skin 3 (three) times daily with meals. 8 units 3 times per day before meals + correctional insulin MDD 50 units per day Subcutaneous for 30 days   Incruse Ellipta 62.5 MCG/ACT Aepb Generic drug: umeclidinium bromide Inhale 1 puff into the lungs daily.   ketoconazole 2 % shampoo Commonly known as: NIZORAL APPLY 1 APPLICATION TOPICALLY 2 (TWO) TIMES A WEEK.   losartan 50 MG tablet Commonly known as: COZAAR Take 50 mg by mouth daily.   nitroGLYCERIN 0.4 MG SL  tablet Commonly known as: NITROSTAT Place 1 tablet (0.4 mg total) under the tongue every 5 (five) minutes as needed for chest pain.   pantoprazole 40 MG tablet Commonly known as: PROTONIX Take 1 tablet (40 mg total) by mouth 2 (two) times daily. What changed: when to take this   Ventolin HFA 108 (90 Base) MCG/ACT inhaler Generic drug: albuterol Inhale 1-2 puffs into the lungs every 6 (six) hours as needed. What changed: reasons to take this        Allergies  Allergen Reactions   Nickel Rash    Other Reaction(s): Unknown  Rash after ear piercing and navel piercings   Oxycodone-Acetaminophen Nausea And Vomiting   Metformin Diarrhea   Elemental Sulfur Hives, Rash and Other (See Comments)    "Burns the skin," also   Lamictal [Lamotrigine] Rash   Latex Itching and Rash   Sulfa Antibiotics Rash   Sulfur Hives and Rash    Other Reaction(s): Other  "Burns the skin," also   Tape Rash and Other (See Comments)    Irritates the skin    Consultations: None   Procedures/Studies: ECHOCARDIOGRAM  COMPLETE  Result Date: 12/29/2022    ECHOCARDIOGRAM REPORT   Patient Name:   Courtney Bennett Date of Exam: 12/29/2022 Medical Rec #:  [id number]       Height:       63.0 in Accession #:    [id number]      Weight:       107.0 lb Date of Birth:  July 12, 1965       BSA:          1.482 m Patient Age:    56 years        BP:           102/66 mmHg Patient Gender: F               HR:           64 bpm. Exam Location:  Inpatient Procedure: 2D Echo, Cardiac Doppler and Color Doppler Indications:    Chest Pain  History:        Patient has prior history of Echocardiogram examinations, most                 recent 12/12/2019. Risk Factors:Hypertension and Diabetes.  Sonographer:    Harriette Bouillon RDCS Referring Phys: [id number] KATY L FOUST IMPRESSIONS  1. Left ventricular ejection fraction, by estimation, is 60 to 65%. The left ventricle has normal function. The left ventricle has no regional wall motion  abnormalities. There is mild concentric left ventricular hypertrophy. Left ventricular diastolic parameters are consistent with Grade I diastolic dysfunction (impaired relaxation).  2. Right ventricular systolic function is normal. The right ventricular size is normal. Tricuspid regurgitation signal is inadequate for assessing PA pressure.  3. Left atrial size was mildly dilated.  4. The mitral valve is normal in structure. No evidence of mitral valve regurgitation. No evidence of mitral stenosis.  5. The aortic valve is tricuspid. Aortic valve regurgitation is not visualized. No aortic stenosis is present.  6. The inferior vena cava is normal in size with <50% respiratory variability, suggesting right atrial pressure of 8 mmHg. FINDINGS  Left Ventricle: Left ventricular ejection fraction, by estimation, is 60 to 65%. The left ventricle has normal function. The left ventricle has no regional wall motion abnormalities. The left ventricular internal cavity size was normal in size. There is  mild concentric left ventricular hypertrophy. Left ventricular diastolic parameters are consistent with Grade I diastolic dysfunction (impaired relaxation). Right Ventricle: The right ventricular size is normal. No increase in right ventricular wall thickness. Right ventricular systolic function is normal. Tricuspid regurgitation signal is inadequate for assessing PA pressure. Left Atrium: Left atrial size was mildly dilated. Right Atrium: Right atrial size was normal in size. Pericardium: There is no evidence of pericardial effusion. Mitral Valve: The mitral valve is normal in structure. No evidence of mitral valve regurgitation. No evidence of mitral valve stenosis. Tricuspid Valve: The tricuspid valve is normal in structure. Tricuspid valve regurgitation is not demonstrated. Aortic Valve: The aortic valve is tricuspid. Aortic valve regurgitation is not visualized. No aortic stenosis is present. Pulmonic Valve: The pulmonic valve  was normal in structure. Pulmonic valve regurgitation is not visualized. Aorta: The aortic root is normal in size and structure. Venous: The inferior vena cava is normal in size with less than 50% respiratory variability, suggesting right atrial pressure of 8 mmHg. IAS/Shunts: No atrial level shunt detected by color flow Doppler.  LEFT VENTRICLE PLAX 2D LVIDd:         4.20 cm   Diastology LVIDs:  2.80 cm   LV e' medial:    6.31 cm/s LV PW:         1.00 cm   LV E/e' medial:  12.1 LV IVS:        1.00 cm   LV e' lateral:   8.49 cm/s LVOT diam:     1.90 cm   LV E/e' lateral: 9.0 LV SV:         56 LV SV Index:   38 LVOT Area:     2.84 cm  RIGHT VENTRICLE            IVC RV S prime:     7.51 cm/s  IVC diam: 2.00 cm TAPSE (M-mode): 1.3 cm LEFT ATRIUM           Index        RIGHT ATRIUM          Index LA diam:      3.80 cm 2.56 cm/m   RA Area:     7.08 cm LA Vol (A2C): 33.0 ml 22.26 ml/m  RA Volume:   13.00 ml 8.77 ml/m LA Vol (A4C): 45.5 ml 30.70 ml/m  AORTIC VALVE LVOT Vmax:   122.00 cm/s LVOT Vmean:  75.300 cm/s LVOT VTI:    0.198 m  AORTA Ao Root diam: 3.00 cm Ao Asc diam:  2.50 cm MITRAL VALVE MV Area (PHT): 3.65 cm    SHUNTS MV Decel Time: 208 msec    Systemic VTI:  0.20 m MV E velocity: 76.30 cm/s  Systemic Diam: 1.90 cm MV A velocity: 70.30 cm/s MV E/A ratio:  1.09 Dalton McleanMD Electronically signed by Wilfred Lacy Signature Date/Time: 12/29/2022/6:02:37 PM    Final    DG Chest 2 View  Result Date: 12/28/2022 CLINICAL DATA:  Left-sided chest pain. EXAM: CHEST - 2 VIEW COMPARISON:  December 05, 2022 FINDINGS: The heart size and mediastinal contours are within normal limits. There is no evidence of an acute infiltrate, pleural effusion or pneumothorax. The visualized skeletal structures are unremarkable. IMPRESSION: No active cardiopulmonary disease. Electronically Signed   By: Aram Candela M.D.   On: 12/28/2022 22:20    Subjective: Patient was seen and examined at bedside. Overnight events  noted.   Patient reports doing much better and wants to be discharged.  Patient is being discharged home.  Discharge Exam: Vitals:   12/31/22 0640 12/31/22 1150  BP:  (!) 145/91  Pulse:  80  Resp:  18  Temp:  97.6 F (36.4 C)  SpO2: 92% 95%   Vitals:   12/30/22 2016 12/31/22 0408 12/31/22 0640 12/31/22 1150  BP: (!) 145/73 (!) 160/74  (!) 145/91  Pulse: 70 71  80  Resp: 18 18  18   Temp: 98.3 F (36.8 C) 98.2 F (36.8 C)  97.6 F (36.4 C)  TempSrc: Oral Oral  Oral  SpO2: 97% 94% 92% 95%  Weight:      Height:        General: Pt is alert, awake, not in acute distress Cardiovascular: RRR, S1/S2 +, no rubs, no gallops Respiratory: CTA bilaterally, no wheezing, no rhonchi Abdominal: Soft, NT, ND, bowel sounds + Extremities: no edema, no cyanosis    The results of significant diagnostics from this hospitalization (including imaging, microbiology, ancillary and laboratory) are listed below for reference.     Microbiology: Recent Results (from the past 240 hour(s))  MRSA Next Gen by PCR, Nasal     Status: None   Collection Time: 12/29/22  5:41  PM   Specimen: Nasal Mucosa; Nasal Swab  Result Value Ref Range Status   MRSA by PCR Next Gen NOT DETECTED NOT DETECTED Final    Comment: (NOTE) The GeneXpert MRSA Assay (FDA approved for NASAL specimens only), is one component of a comprehensive MRSA colonization surveillance program. It is not intended to diagnose MRSA infection nor to guide or monitor treatment for MRSA infections. Test performance is not FDA approved in patients less than 49 years old. Performed at [facility], [address], [place], [place] [address]      Labs: BNP (last 3 results) No results for input(s): "BNP" in the last 8760 hours. Basic Metabolic Panel: Recent Labs  Lab 12/28/22 2140 12/30/22 0515  NA 131* 135  K 4.4 4.1  CL 98 101  CO2 24 26  GLUCOSE 424* 233*  BUN 22* 24*  CREATININE 0.73 0.52  CALCIUM 8.9 8.3*   MG  --  1.9   Liver Function Tests: Recent Labs  Lab 12/30/22 0515  AST 13*  ALT 14  ALKPHOS 67  BILITOT 0.3  PROT 5.5*  ALBUMIN 3.1*   No results for input(s): "LIPASE", "AMYLASE" in the last 168 hours. No results for input(s): "AMMONIA" in the last 168 hours. CBC: Recent Labs  Lab 12/28/22 2140 12/30/22 0515  WBC 5.4 6.1  HGB 14.8 14.9  HCT 45.3 46.1*  MCV 95.6 96.4  PLT 204 187   Cardiac Enzymes: No results for input(s): "CKTOTAL", "CKMB", "CKMBINDEX", "TROPONINI" in the last 168 hours. BNP: Invalid input(s): "POCBNP" CBG: Recent Labs  Lab 12/30/22 1634 12/30/22 2006 12/30/22 2354 12/31/22 0749 12/31/22 1147  GLUCAP 289* 195* 253* 268* 248*   D-Dimer Recent Labs    12/29/22 0256  DDIMER <0.27   Hgb A1c No results for input(s): "HGBA1C" in the last 72 hours. Lipid Profile Recent Labs    12/28/22 2140  CHOL 134  HDL 50  LDLCALC 67  TRIG 84  CHOLHDL 2.7   Thyroid function studies No results for input(s): "TSH", "T4TOTAL", "T3FREE", "THYROIDAB" in the last 72 hours.  Invalid input(s): "FREET3" Anemia work up No results for input(s): "VITAMINB12", "FOLATE", "FERRITIN", "TIBC", "IRON", "RETICCTPCT" in the last 72 hours. Urinalysis    Component Value Date/Time   COLORURINE YELLOW 11/29/2022 1931   APPEARANCEUR HAZY (A) 11/29/2022 1931   LABSPEC 1.020 11/29/2022 1931   PHURINE 7.0 11/29/2022 1931   GLUCOSEU >=500 (A) 11/29/2022 1931   HGBUR NEGATIVE 11/29/2022 1931   BILIRUBINUR NEGATIVE 11/29/2022 1931   KETONESUR NEGATIVE 11/29/2022 1931   PROTEINUR NEGATIVE 11/29/2022 1931   UROBILINOGEN 0.2 03/02/2012 0159   NITRITE NEGATIVE 11/29/2022 1931   LEUKOCYTESUR NEGATIVE 11/29/2022 1931   Sepsis Labs Recent Labs  Lab 12/28/22 2140 12/30/22 0515  WBC 5.4 6.1   Microbiology Recent Results (from the past 240 hour(s))  MRSA Next Gen by PCR, Nasal     Status: None   Collection Time: 12/29/22  5:41 PM   Specimen: Nasal Mucosa; Nasal Swab   Result Value Ref Range Status   MRSA by PCR Next Gen NOT DETECTED NOT DETECTED Final    Comment: (NOTE) The GeneXpert MRSA Assay (FDA approved for NASAL specimens only), is one component of a comprehensive MRSA colonization surveillance program. It is not intended to diagnose MRSA infection nor to guide or monitor treatment for MRSA infections. Test performance is not FDA approved in patients less than 67 years old. Performed at [facility], [address] [place], [place]  [address]      Time coordinating discharge: Over 30 minutes  SIGNED:   Willeen Niece, MD  [facility] 12/31/2022, 2:45 PM Pager   If 7PM-7AM, please contact night-coverage

## 2022-12-31 NOTE — Inpatient Diabetes Management (Signed)
Inpatient Diabetes Program Recommendations  AACE/ADA: New Consensus Statement on Inpatient Glycemic Control (2015)  Target Ranges:  Prepandial:   less than 140 mg/dL      Peak postprandial:   less than 180 mg/dL (1-2 hours)      Critically ill patients:  140 - 180 mg/dL   Lab Results  Component Value Date   GLUCAP 268 (H) 12/31/2022   HGBA1C 12.0 (H) 11/30/2022    Review of Glycemic Control  Latest Reference Range & Units 12/30/22 07:43 12/30/22 11:53 12/30/22 16:34 12/30/22 20:06 12/30/22 23:54 12/31/22 07:49  Glucose-Capillary 70 - 99 mg/dL 160 (H) 737 (H) 106 (H) 195 (H) 253 (H) 268 (H)  (H): Data is abnormally high  Diabetes history: DM2 Outpatient Diabetes medications:  Lantus 14 units every day Humalog 8 units TID and SSI Rybelsus 7 mg QD Current orders for Inpatient glycemic control:  Semglee 5 every day Novolog 0-6 units Q4H   Inpatient Diabetes Program Recommendations:     Novolog 0-6 units TID and 0-5 units at bedtime Novolog 3 units TID with meals if she consumes at least 50%   Will continue to follow while inpatient.   Thank you, Dulce Sellar, MSN, CDCES Diabetes Coordinator Inpatient Diabetes Program [phone] (team pager from 8a-5p)

## 2022-12-31 NOTE — TOC Progression Note (Signed)
Transition of Care [facility]) - Progression Note    Patient Details  Name: Courtney Bennett MRN: [id number] Date of Birth: 1966/04/14  [facility] CM/SW Contact  Geni Bers, RN Phone Number: 12/31/2022, 11:46 AM  Clinical Narrative:    Spoke with pt again concerning housing and insulin. Encouraged pt again to go to [facility] for assistance. Pt states they did not help her before. Asked pt to try again and with housing authority. Pt will need Bus pass..        Expected Discharge Plan and Services                                               Social Determinants of Health (SDOH) Interventions SDOH Screenings   Food Insecurity: Food Insecurity Present (12/29/2022)  Housing: High Risk (12/29/2022)  Transportation Needs: Unmet Transportation Needs (12/29/2022)  Utilities: Not At Risk (12/29/2022)  Depression (PHQ2-9): Medium Risk (06/16/2019)  Tobacco Use: Medium Risk (12/28/2022)    Readmission Risk Interventions     No data to display

## 2023-01-01 ENCOUNTER — Other Ambulatory Visit: Payer: Self-pay | Admitting: Nurse Practitioner

## 2023-01-01 DIAGNOSIS — N644 Mastodynia: Secondary | ICD-10-CM

## 2023-01-05 ENCOUNTER — Encounter (HOSPITAL_COMMUNITY): Payer: Self-pay

## 2023-01-05 ENCOUNTER — Emergency Department (HOSPITAL_COMMUNITY)
Admission: EM | Admit: 2023-01-05 | Discharge: 2023-01-05 | Discharge status: Against Medical Advice | Payer: 59 | Attending: Emergency Medicine | Admitting: Emergency Medicine

## 2023-01-05 DIAGNOSIS — Z609 Problem related to social environment, unspecified: Secondary | ICD-10-CM | POA: Diagnosis not present

## 2023-01-05 DIAGNOSIS — R7309 Other abnormal glucose: Secondary | ICD-10-CM | POA: Insufficient documentation

## 2023-01-05 DIAGNOSIS — F419 Anxiety disorder, unspecified: Secondary | ICD-10-CM | POA: Insufficient documentation

## 2023-01-05 LAB — CBG MONITORING, ED: Glucose-Capillary: 295 mg/dL — ABNORMAL HIGH (ref 70–99)

## 2023-01-05 NOTE — ED Notes (Signed)
PT refusing lab work and Personal assistant. Pt yelling at this nurse for event that occurred during previous visits and prior to arrival today. Attempting to reason with pt but pt continues yelling at this RN. Pt attempted to go smoke outside. Attempted to educate pt that she cannot leave the ED while she is still a pt. Pt continues to yell at this RN and states she doesn't want any medical treatment. Pt states she will leave AMA. Provider made aware.

## 2023-01-05 NOTE — ED Triage Notes (Signed)
Pt BIB [facility] from parking lot with c/o anxiety. Pt was on the phone with her friend and was making statements about being depressed and overwhelmed so friend called EMS. EMS reports pt was having panic attack on their arrival. GCS 15. Denies SI/HI.   EMS vitals: BP 180 90 HR 94 RR 20 O2 95% room air CBG 350

## 2023-01-05 NOTE — ED Provider Notes (Signed)
[facility] Emergency Department Provider Note MRN:  [id number]  Arrival date & time: 01/05/23     Chief Complaint   Anxiety   History of Present Illness   Courtney Bennett is a 57 y.o. year-old female presents to the ED with chief complaint of anxiousness.  She was brought in by EMS after complaining to her friend that she felt overwhelmed.  She is homeless and states that she has many medical problems, but is unable to get any help.  She states that her disability pay is not enough to cover housing.  She states that she does not know what she wants [place] to do.  She denies any specific complaints tonight other than all of her chronic social problems.  Chart review shows recent admission for chest pain rule out.  History provided by patient.   Review of Systems  Pertinent positive and negative review of systems noted in HPI.    Physical Exam   Vitals:   01/05/23 0220  BP: (!) 188/92  Pulse: 79  Resp: 16  Temp: 98.1 F (36.7 C)  SpO2: 93%    CONSTITUTIONAL:  non toxic-appearing, NAD NEURO:  Alert and oriented x 3, CN 3-12 grossly intact EYES:  eyes equal and reactive ENT/NECK:  Supple, no stridor  CARDIO:  normal rate, appears well-perfused  PULM:  No respiratory distress, coarse cough GI/GU:  non-distended,  MSK/SPINE:  No gross deformities, no edema, moves all extremities  SKIN:  no rash, atraumatic   *Additional and/or pertinent findings included in MDM below  Diagnostic and Interventional Summary    EKG Interpretation Date/Time:    Ventricular Rate:    PR Interval:    QRS Duration:    QT Interval:    QTC Calculation:   R Axis:      Text Interpretation:         Labs Reviewed  CBG MONITORING, ED - Abnormal; Notable for the following components:      Result Value   Glucose-Capillary 295 (*)    All other components within normal limits  SARS CORONAVIRUS 2 BY RT PCR  COMPREHENSIVE METABOLIC PANEL  ETHANOL  RAPID URINE DRUG  SCREEN, HOSP PERFORMED  CBC WITH DIFFERENTIAL/PLATELET    DG Chest 2 View    (Results Pending)    Medications - No data to display   Procedures  /  Critical Care Procedures  ED Course and Medical Decision Making  I have reviewed the triage vital signs, the nursing notes, and pertinent available records from the EMR.  Social Determinants Affecting Complexity of Care: Patient has no clinically significant social determinants affecting this chief complaint..   ED Course:    Medical Decision Making Amount and/or Complexity of Data Reviewed Labs: ordered. Radiology: ordered.         Consultants: No consultations were needed in caring for this patient.   Treatment and Plan: Patient eloped.  I was not able to have any AMA discussion with the patient.  She did not appear toxic.  She denied any SI or HI.  While I did offer her help and workup in the emergency department, I do not feel that she necessarily needs to be called back.  She also has capacity to make her own medical decisions and chose to leave prior to completing workup.    Final Clinical Impressions(s) / ED Diagnoses     ICD-10-CM   1. Social problem  Z60.9       ED Discharge Orders  None         Discharge Instructions Discussed with and Provided to Patient:   Discharge Instructions   None      Roxy Horseman, PA-C 01/05/23 0340    Gloris Manchester, MD 01/06/23 [phone]

## 2023-01-07 ENCOUNTER — Other Ambulatory Visit: Payer: 59

## 2023-01-18 ENCOUNTER — Other Ambulatory Visit: Payer: 59

## 2023-01-28 ENCOUNTER — Other Ambulatory Visit: Payer: Self-pay

## 2023-01-28 ENCOUNTER — Emergency Department (HOSPITAL_BASED_OUTPATIENT_CLINIC_OR_DEPARTMENT_OTHER)
Admission: EM | Admit: 2023-01-28 | Discharge: 2023-01-28 | Disposition: A | Discharge status: Home / Self Care | Payer: 59 | Attending: Emergency Medicine | Admitting: Emergency Medicine

## 2023-01-28 DIAGNOSIS — Z7982 Long term (current) use of aspirin: Secondary | ICD-10-CM | POA: Diagnosis not present

## 2023-01-28 DIAGNOSIS — Z91040 Latex allergy status: Secondary | ICD-10-CM | POA: Insufficient documentation

## 2023-01-28 DIAGNOSIS — E1065 Type 1 diabetes mellitus with hyperglycemia: Secondary | ICD-10-CM | POA: Insufficient documentation

## 2023-01-28 DIAGNOSIS — E871 Hypo-osmolality and hyponatremia: Secondary | ICD-10-CM | POA: Diagnosis not present

## 2023-01-28 DIAGNOSIS — Z794 Long term (current) use of insulin: Secondary | ICD-10-CM | POA: Insufficient documentation

## 2023-01-28 DIAGNOSIS — L03311 Cellulitis of abdominal wall: Secondary | ICD-10-CM | POA: Insufficient documentation

## 2023-01-28 DIAGNOSIS — L0390 Cellulitis, unspecified: Secondary | ICD-10-CM

## 2023-01-28 DIAGNOSIS — E875 Hyperkalemia: Secondary | ICD-10-CM | POA: Insufficient documentation

## 2023-01-28 DIAGNOSIS — R1032 Left lower quadrant pain: Secondary | ICD-10-CM | POA: Diagnosis present

## 2023-01-28 DIAGNOSIS — Z79899 Other long term (current) drug therapy: Secondary | ICD-10-CM | POA: Insufficient documentation

## 2023-01-28 LAB — BASIC METABOLIC PANEL
Anion gap: 8 (ref 5–15)
BUN: 19 mg/dL (ref 6–20)
CO2: 26 mmol/L (ref 22–32)
Calcium: 8.3 mg/dL — ABNORMAL LOW (ref 8.9–10.3)
Chloride: 100 mmol/L (ref 98–111)
Creatinine, Ser: 0.58 mg/dL (ref 0.44–1.00)
GFR, Estimated: >60 mL/min (ref >60)
Glucose, Bld: 420 mg/dL — ABNORMAL HIGH (ref 70–99)
Potassium: 5.3 mmol/L — ABNORMAL HIGH (ref 3.5–5.1)
Sodium: 134 mmol/L — ABNORMAL LOW (ref 135–145)

## 2023-01-28 LAB — CBC WITH DIFFERENTIAL/PLATELET
Abs Immature Granulocytes: 0.02 10*3/uL (ref 0.00–0.07)
Basophils Absolute: 0.1 10*3/uL (ref 0.0–0.1)
Basophils Relative: 1 %
Eosinophils Absolute: 0.1 10*3/uL (ref 0.0–0.5)
Eosinophils Relative: 2 %
HCT: 45.6 % (ref 36.0–46.0)
Hemoglobin: 15.2 g/dL — ABNORMAL HIGH (ref 12.0–15.0)
Immature Granulocytes: 0 %
Lymphocytes Relative: 20 %
Lymphs Abs: 1.4 10*3/uL (ref 0.7–4.0)
MCH: 31.7 pg (ref 26.0–34.0)
MCHC: 33.3 g/dL (ref 30.0–36.0)
MCV: 95 fL (ref 80.0–100.0)
Monocytes Absolute: 0.5 10*3/uL (ref 0.1–1.0)
Monocytes Relative: 8 %
Neutro Abs: 4.8 10*3/uL (ref 1.7–7.7)
Neutrophils Relative %: 69 %
Platelets: 185 10*3/uL (ref 150–400)
RBC: 4.8 MIL/uL (ref 3.87–5.11)
RDW: 12.7 % (ref 11.5–15.5)
WBC: 7 10*3/uL (ref 4.0–10.5)
nRBC: 0 % (ref 0.0–0.2)

## 2023-01-28 LAB — CBG MONITORING, ED: Glucose-Capillary: 412 mg/dL — ABNORMAL HIGH (ref 70–99)

## 2023-01-28 MED ORDER — MUPIROCIN 2 % EX OINT: TOPICAL_OINTMENT | Freq: Once | CUTANEOUS | Status: AC

## 2023-01-28 MED ORDER — MUPIROCIN CALCIUM 2 % EX CREA: 1.0000 | TOPICAL_CREAM | Freq: Two times a day (BID) | CUTANEOUS | 0 refills | Status: DC

## 2023-01-28 MED ORDER — INSULIN ASPART PROT & ASPART (70-30 MIX) 100 UNIT/ML ~~LOC~~ SUSP
4.0000 [IU] | Freq: Once | SUBCUTANEOUS | Status: AC
  Administered 2023-01-28: 4 [IU] via SUBCUTANEOUS
  Filled 2023-01-28: qty 4

## 2023-01-28 NOTE — ED Notes (Signed)
Reviewed AVS/discharge instruction with patient. Time allotted for and all questions answered. Patient is agreeable for d/c and escorted to ed exit by staff.  

## 2023-01-28 NOTE — Discharge Instructions (Addendum)
Please apply the antibiotic ointment to the open wound areas.  They do not appear to be infected now but this is more of a precautionary measure.  Please keep areas clean and dry as best you can.  You may return to the emergency room if any worsening symptoms.

## 2023-01-28 NOTE — ED Provider Notes (Signed)
[place] EMERGENCY DEPARTMENT AT [facility] Provider Note   CSN: [id number] Arrival date & time: 01/28/23  1134     History Chief Complaint  Patient presents with   Abscess    Courtney Bennett is a 57 y.o. female patient who is a type I diabetic who presents to the emergency department with multiple wounds of various stages.  Unknown when this started.  Patient is currently homeless living in a tent and states that she gets bit by many insects daily.  The 2 that she complains about the most is her localized over the left lower quadrant abdomen and posterior right shoulder.  She denies fever, chills, drug use.  She does state that the wound of the left lower quadrant was draining some greenish material within the last 24 hours.   Abscess      Home Medications Prior to Admission medications   Medication Sig Start Date End Date Taking? Authorizing Provider  albuterol (VENTOLIN HFA) 108 (90 Base) MCG/ACT inhaler Inhale 1-2 puffs into the lungs every 6 (six) hours as needed. Patient taking differently: Inhale 1-2 puffs into the lungs every 6 (six) hours as needed for wheezing or shortness of breath. 12/01/22   Atway, Rayann N, DO  aspirin EC 81 MG tablet Take 1 tablet (81 mg total) by mouth daily. Swallow whole. 01/01/23   Willeen Niece, MD  blood glucose meter kit and supplies Dispense based on patient and insurance preference. Use up to four times daily as directed. (FOR ICD-10 E10.9, E11.9). 12/15/19   Alwyn Ren, MD  Continuous Blood Gluc Sensor (FREESTYLE LIBRE 14 DAY SENSOR) MISC 1 application by Subdermal route See admin instructions. Check blood glucose 1-2 times daily. E11.65 05/11/19   CiriglianoJearld Lesch, DO  estrogens, conjugated, (PREMARIN) 0.3 MG tablet Take 1 tablet (0.3 mg total) by mouth daily. Patient not taking: Reported on 12/29/2022 06/16/19   Overton Mam, DO  gabapentin (NEURONTIN) 100 MG capsule Take 1 capsule (100 mg total) by mouth 2 (two)  times daily. 12/31/22   Willeen Niece, MD  glucose blood (ACCU-CHEK AVIVA PLUS) test strip Use twice daily 12/01/22   Atway, Rayann N, DO  HUMALOG KWIKPEN 200 UNIT/ML KwikPen Inject 8 Units into the skin 3 (three) times daily with meals. 8 units 3 times per day before meals + correctional insulin MDD 50 units per day Subcutaneous for 30 days 12/01/22   Atway, Rayann N, DO  Insulin Pen Needle 32G X 4 MM MISC Use 2 times per day 12/01/22   Atway, Rayann N, DO  ketoconazole (NIZORAL) 2 % shampoo APPLY 1 APPLICATION TOPICALLY 2 (TWO) TIMES A WEEK. Patient not taking: Reported on 12/29/2022 07/13/19   Overton Mam, DO  losartan (COZAAR) 50 MG tablet Take 50 mg by mouth daily. 10/13/22   [provider]  nitroGLYCERIN (NITROSTAT) 0.4 MG SL tablet Place 1 tablet (0.4 mg total) under the tongue every 5 (five) minutes as needed for chest pain. 12/31/22   Willeen Niece, MD  pantoprazole (PROTONIX) 40 MG tablet Take 1 tablet (40 mg total) by mouth 2 (two) times daily. 12/31/22 01/30/23  Willeen Niece, MD  umeclidinium bromide (INCRUSE ELLIPTA) 62.5 MCG/ACT AEPB Inhale 1 puff into the lungs daily. 12/02/22   Atway, Rayann N, DO      Allergies    Nickel, Oxycodone-acetaminophen, Metformin, Elemental sulfur, Lamictal [lamotrigine], Latex, Sulfa antibiotics, Sulfur, and Tape    Review of Systems   Review of Systems  All other systems  reviewed and are negative.   Physical Exam Updated Vital Signs BP (!) 168/87   Pulse 76   Temp 98.4 F (36.9 C) (Oral)   Resp 11   LMP 11/24/2014   SpO2 93%  Physical Exam Vitals and nursing note reviewed.  Constitutional:      Appearance: Normal appearance.  HENT:     Head: Normocephalic and atraumatic.  Eyes:     General:        Right eye: No discharge.        Left eye: No discharge.     Conjunctiva/sclera: Conjunctivae normal.  Pulmonary:     Effort: Pulmonary effort is normal.  Skin:    General: Skin is warm and dry.     Findings: No rash.      Comments: Multiple wounds and shallow ulcerations of various stages of healing.  There are a few over the right posterior shoulder that are fractured but do not appear to be infected.  There is no surrounding erythema or purulent drainage.  The wound of the left lower quadrant does appear to be a shallow ulceration.  Upon expression there is no purulent material.  There is minimal surrounding erythema.  Neurological:     General: No focal deficit present.     Mental Status: She is alert.  Psychiatric:        Mood and Affect: Mood normal.        Behavior: Behavior normal.     ED Results / Procedures / Treatments   Labs (all labs ordered are listed, but only abnormal results are displayed) Labs Reviewed  CBC WITH DIFFERENTIAL/PLATELET - Abnormal; Notable for the following components:      Result Value   Hemoglobin 15.2 (*)    All other components within normal limits  BASIC METABOLIC PANEL - Abnormal; Notable for the following components:   Sodium 134 (*)    Potassium 5.3 (*)    Glucose, Bld 420 (*)    Calcium 8.3 (*)    All other components within normal limits  CBG MONITORING, ED - Abnormal; Notable for the following components:   Glucose-Capillary 412 (*)    All other components within normal limits    EKG None  Radiology No results found.  Procedures Procedures    Medications Ordered in ED Medications  mupirocin cream (BACTROBAN) 2 % (has no administration in time range)  insulin aspart protamine- aspart (NOVOLOG MIX 70/30) injection 4 Units (4 Units Subcutaneous Given 01/28/23 1301)    ED Course/ Medical Decision Making/ A&P Clinical Course as of 01/28/23 1434  Thu Jan 28, 2023  1426 Basic metabolic panel(!) Patient is hyperkalemic and slightly hyponatremic.  Patient will be receiving 4 units of insulin for her hyperglycemia which should correct the hyperkalemia. [CF]  1427 CBC with Differential(!) Normal. [CF]  1427 CBG monitoring, ED(!) Elevated. [CF]     Clinical Course User Index [CF] Teressa Lower, PA-C   {   Click here for ABCD2, HEART and other calculators  Medical Decision Making Courtney Bennett is a 57 y.o. female patient who presents to the emergency department today for further evaluation of wound check.  Patient has numerous wounds at various stages.  None of them appear infected.  I attempted to express the wound of the left lower quadrant abdomen and there was no purulent material.  There is no surrounding warmth or erythema to these wounds.  I will likely give her some mupirocin ointment for her wounds and give her  some wound care materials to take as the patient is homeless and does not have good resources.  Her labs are all normal. Strict return precautions given. She is safe for discharge.    Amount and/or Complexity of Data Reviewed Labs: ordered. Decision-making details documented in ED Course.  Risk Prescription drug management.    Final Clinical Impression(s) / ED Diagnoses Final diagnoses:  Wound cellulitis    Rx / DC Orders ED Discharge Orders          Ordered    mupirocin cream (BACTROBAN) 2 %  2 times daily,   Status:  Discontinued        01/28/23 1428              Honor Loh [place], [place] 01/28/23 1434    Rondel Baton, MD 01/29/23 [phone]

## 2023-01-28 NOTE — ED Notes (Signed)
Wound care provided.

## 2023-01-28 NOTE — ED Triage Notes (Signed)
Pt here for wound check and has red raised area to LLQ in abdomen and on R shoulder. Pt denies having recent fevers. States the abdominal wound has been "oozing green stuff". Pt has hx of T1DM

## 2023-02-10 ENCOUNTER — Ambulatory Visit
Admission: RE | Admit: 2023-02-10 | Discharge: 2023-02-10 | Disposition: A | Discharge status: Home / Self Care | Payer: 59 | Source: Ambulatory Visit | Attending: Nurse Practitioner | Admitting: Nurse Practitioner

## 2023-02-10 ENCOUNTER — Other Ambulatory Visit: Payer: Self-pay | Admitting: Nurse Practitioner

## 2023-02-10 ENCOUNTER — Ambulatory Visit
Admission: RE | Admit: 2023-02-10 | Discharge: 2023-02-10 | Disposition: A | Discharge status: Home / Self Care | Payer: 59 | Source: Ambulatory Visit | Attending: Nurse Practitioner

## 2023-02-10 DIAGNOSIS — N6452 Nipple discharge: Secondary | ICD-10-CM

## 2023-02-10 DIAGNOSIS — N644 Mastodynia: Secondary | ICD-10-CM

## 2023-02-10 DIAGNOSIS — N6310 Unspecified lump in the right breast, unspecified quadrant: Secondary | ICD-10-CM

## 2023-02-15 ENCOUNTER — Other Ambulatory Visit: Payer: 59

## 2023-02-18 ENCOUNTER — Ambulatory Visit
Admission: RE | Admit: 2023-02-18 | Discharge: 2023-02-18 | Disposition: A | Discharge status: Home / Self Care | Payer: 59 | Source: Ambulatory Visit | Attending: Nurse Practitioner | Admitting: Nurse Practitioner

## 2023-02-18 DIAGNOSIS — N6310 Unspecified lump in the right breast, unspecified quadrant: Secondary | ICD-10-CM

## 2023-02-18 HISTORY — PX: BREAST BIOPSY: SHX20

## 2023-02-19 LAB — SURGICAL PATHOLOGY

## 2023-03-02 ENCOUNTER — Encounter (HOSPITAL_COMMUNITY): Payer: Self-pay

## 2023-03-02 ENCOUNTER — Emergency Department (HOSPITAL_COMMUNITY): Payer: 59

## 2023-03-02 ENCOUNTER — Emergency Department (HOSPITAL_COMMUNITY)
Admission: EM | Admit: 2023-03-02 | Discharge: 2023-03-02 | Disposition: A | Discharge status: Home / Self Care | Payer: 59

## 2023-03-02 ENCOUNTER — Other Ambulatory Visit: Payer: Self-pay

## 2023-03-02 DIAGNOSIS — E119 Type 2 diabetes mellitus without complications: Secondary | ICD-10-CM | POA: Insufficient documentation

## 2023-03-02 DIAGNOSIS — Z794 Long term (current) use of insulin: Secondary | ICD-10-CM | POA: Diagnosis not present

## 2023-03-02 DIAGNOSIS — Z79899 Other long term (current) drug therapy: Secondary | ICD-10-CM | POA: Insufficient documentation

## 2023-03-02 DIAGNOSIS — I10 Essential (primary) hypertension: Secondary | ICD-10-CM | POA: Diagnosis not present

## 2023-03-02 DIAGNOSIS — Z7982 Long term (current) use of aspirin: Secondary | ICD-10-CM | POA: Diagnosis not present

## 2023-03-02 DIAGNOSIS — R109 Unspecified abdominal pain: Secondary | ICD-10-CM | POA: Diagnosis not present

## 2023-03-02 DIAGNOSIS — J189 Pneumonia, unspecified organism: Secondary | ICD-10-CM | POA: Diagnosis not present

## 2023-03-02 DIAGNOSIS — Z91040 Latex allergy status: Secondary | ICD-10-CM | POA: Diagnosis not present

## 2023-03-02 DIAGNOSIS — R059 Cough, unspecified: Secondary | ICD-10-CM | POA: Diagnosis present

## 2023-03-02 LAB — COMPREHENSIVE METABOLIC PANEL
ALT: 12 U/L (ref 0–44)
AST: 14 U/L — ABNORMAL LOW (ref 15–41)
Albumin: 3.7 g/dL (ref 3.5–5.0)
Alkaline Phosphatase: 81 U/L (ref 38–126)
Anion gap: 11 (ref 5–15)
BUN: 11 mg/dL (ref 6–20)
CO2: 25 mmol/L (ref 22–32)
Calcium: 8.8 mg/dL — ABNORMAL LOW (ref 8.9–10.3)
Chloride: 96 mmol/L — ABNORMAL LOW (ref 98–111)
Creatinine, Ser: 0.39 mg/dL — ABNORMAL LOW (ref 0.44–1.00)
GFR, Estimated: >60 mL/min (ref >60)
Glucose, Bld: 261 mg/dL — ABNORMAL HIGH (ref 70–99)
Potassium: 3.9 mmol/L (ref 3.5–5.1)
Sodium: 132 mmol/L — ABNORMAL LOW (ref 135–145)
Total Bilirubin: 0.6 mg/dL (ref 0.3–1.2)
Total Protein: 7 g/dL (ref 6.5–8.1)

## 2023-03-02 LAB — CBC
HCT: 52.2 % — ABNORMAL HIGH (ref 36.0–46.0)
Hemoglobin: 17.4 g/dL — ABNORMAL HIGH (ref 12.0–15.0)
MCH: 31.3 pg (ref 26.0–34.0)
MCHC: 33.3 g/dL (ref 30.0–36.0)
MCV: 93.9 fL (ref 80.0–100.0)
Platelets: 217 10*3/uL (ref 150–400)
RBC: 5.56 MIL/uL — ABNORMAL HIGH (ref 3.87–5.11)
RDW: 12.2 % (ref 11.5–15.5)
WBC: 8.2 10*3/uL (ref 4.0–10.5)
nRBC: 0 % (ref 0.0–0.2)

## 2023-03-02 LAB — URINALYSIS, ROUTINE W REFLEX MICROSCOPIC
Bacteria, UA: NONE SEEN
Bilirubin Urine: NEGATIVE
Glucose, UA: >=500 mg/dL — AB
Hgb urine dipstick: NEGATIVE
Ketones, ur: 20 mg/dL — AB
Leukocytes,Ua: NEGATIVE
Nitrite: NEGATIVE
Protein, ur: NEGATIVE mg/dL
Specific Gravity, Urine: 1.015 (ref 1.005–1.030)
pH: 6 (ref 5.0–8.0)

## 2023-03-02 LAB — LIPASE, BLOOD: Lipase: 31 U/L (ref 11–51)

## 2023-03-02 MED ORDER — SODIUM CHLORIDE 0.9 % IV BOLUS: 1000.0000 mL | Freq: Once | INTRAVENOUS | Status: DC

## 2023-03-02 MED ORDER — AMOXICILLIN-POT CLAVULANATE 875-125 MG PO TABS: 1.0000 | ORAL_TABLET | Freq: Two times a day (BID) | ORAL | 0 refills | Status: DC

## 2023-03-02 MED ORDER — KETOROLAC TROMETHAMINE 15 MG/ML IJ SOLN
15.0000 mg | Freq: Once | INTRAMUSCULAR | Status: DC
  Filled 2023-03-02: qty 1

## 2023-03-02 MED ORDER — AZITHROMYCIN 250 MG PO TABS: 250.0000 mg | ORAL_TABLET | Freq: Every day | ORAL | 0 refills | Status: DC

## 2023-03-02 NOTE — ED Triage Notes (Signed)
Patient BIB EMS for mid abdominal pain, nausea, and vomiting x 2 days. Patient denies fevers. VSS

## 2023-03-02 NOTE — ED Notes (Signed)
Pt still refusing all IV medication and IV start. Pt refusing to answer most questions from staff and dodging/avoiding answering many questions

## 2023-03-02 NOTE — ED Provider Notes (Signed)
[place] EMERGENCY DEPARTMENT AT [facility] Provider Note   CSN: [id number] Arrival date & time: 03/02/23  1840     History  Chief Complaint  Patient presents with   Abdominal Pain    Courtney Bennett is a 57 y.o. female.  57 year old old female with past medical history of diabetes and hypertension presenting to the emergency department today with abdominal pain.  The patient states this began earlier today.  States that she has been having some nausea with this but denies any vomiting.  She reports that she did have a bowel movement earlier today.  She is passing flatus.  She denies any associated urinary symptoms.  She states that her blood sugars have been in the 200s mostly over the past few days.  She has had a cough as well.  She reports this is nonproductive.  Denies any associated chest pain.   Abdominal Pain Associated symptoms: cough and nausea        Home Medications Prior to Admission medications   Medication Sig Start Date End Date Taking? Authorizing Provider  amoxicillin-clavulanate (AUGMENTIN) 875-125 MG tablet Take 1 tablet by mouth every 12 (twelve) hours. 03/02/23  Yes Durwin Glaze, MD  azithromycin (ZITHROMAX) 250 MG tablet Take 1 tablet (250 mg total) by mouth daily. Take first 2 tablets together, then 1 every day until finished. 03/02/23  Yes Durwin Glaze, MD  albuterol (VENTOLIN HFA) 108 (90 Base) MCG/ACT inhaler Inhale 1-2 puffs into the lungs every 6 (six) hours as needed. Patient taking differently: Inhale 1-2 puffs into the lungs every 6 (six) hours as needed for wheezing or shortness of breath. 12/01/22   Atway, Rayann N, DO  aspirin EC 81 MG tablet Take 1 tablet (81 mg total) by mouth daily. Swallow whole. 01/01/23   Willeen Niece, MD  blood glucose meter kit and supplies Dispense based on patient and insurance preference. Use up to four times daily as directed. (FOR ICD-10 E10.9, E11.9). 12/15/19   Alwyn Ren, MD  Continuous Blood  Gluc Sensor (FREESTYLE LIBRE 14 DAY SENSOR) MISC 1 application by Subdermal route See admin instructions. Check blood glucose 1-2 times daily. E11.65 05/11/19   CiriglianoJearld Lesch, DO  estrogens, conjugated, (PREMARIN) 0.3 MG tablet Take 1 tablet (0.3 mg total) by mouth daily. Patient not taking: Reported on 12/29/2022 06/16/19   Overton Mam, DO  gabapentin (NEURONTIN) 100 MG capsule Take 1 capsule (100 mg total) by mouth 2 (two) times daily. 12/31/22   Willeen Niece, MD  glucose blood (ACCU-CHEK AVIVA PLUS) test strip Use twice daily 12/01/22   Atway, Rayann N, DO  HUMALOG KWIKPEN 200 UNIT/ML KwikPen Inject 8 Units into the skin 3 (three) times daily with meals. 8 units 3 times per day before meals + correctional insulin MDD 50 units per day Subcutaneous for 30 days 12/01/22   Atway, Rayann N, DO  Insulin Pen Needle 32G X 4 MM MISC Use 2 times per day 12/01/22   Atway, Rayann N, DO  ketoconazole (NIZORAL) 2 % shampoo APPLY 1 APPLICATION TOPICALLY 2 (TWO) TIMES A WEEK. Patient not taking: Reported on 12/29/2022 07/13/19   Overton Mam, DO  losartan (COZAAR) 50 MG tablet Take 50 mg by mouth daily. 10/13/22   [provider]  nitroGLYCERIN (NITROSTAT) 0.4 MG SL tablet Place 1 tablet (0.4 mg total) under the tongue every 5 (five) minutes as needed for chest pain. 12/31/22   Willeen Niece, MD  pantoprazole (PROTONIX) 40 MG  tablet Take 1 tablet (40 mg total) by mouth 2 (two) times daily. 12/31/22 01/30/23  Willeen Niece, MD  umeclidinium bromide (INCRUSE ELLIPTA) 62.5 MCG/ACT AEPB Inhale 1 puff into the lungs daily. 12/02/22   Atway, Rayann N, DO      Allergies    Nickel, Oxycodone-acetaminophen, Metformin, Elemental sulfur, Lamictal [lamotrigine], Latex, Sulfa antibiotics, Sulfur, and Tape    Review of Systems   Review of Systems  Respiratory:  Positive for cough.   Gastrointestinal:  Positive for abdominal pain and nausea.  All other systems reviewed and are negative.   Physical  Exam Updated Vital Signs BP (!) 193/105 (BP Location: Right Arm)   Pulse 98   Temp 98.1 F (36.7 C) (Oral)   Resp 12   Ht 5\' 3"  (1.6 m)   Wt 44 kg   LMP 11/24/2014   SpO2 92%   BMI 17.18 kg/m  Physical Exam Vitals and nursing note reviewed.   Gen: NAD Eyes: PERRL, EOMI HEENT: no oropharyngeal swelling Neck: trachea midline Resp: clear to auscultation bilaterally Card: RRR, no murmurs, rubs, or gallops Abd: Diffusely tender without any guarding or rebound, do not appreciate any hernias Extremities: no calf tenderness, no edema Vascular: 2+ radial pulses bilaterally, 2+ DP pulses bilaterally Skin: Superficial wounds over the left thigh noted in various stages of healing Psyc: acting appropriately   ED Results / Procedures / Treatments   Labs (all labs ordered are listed, but only abnormal results are displayed) Labs Reviewed  COMPREHENSIVE METABOLIC PANEL - Abnormal; Notable for the following components:      Result Value   Sodium 132 (*)    Chloride 96 (*)    Glucose, Bld 261 (*)    Creatinine, Ser 0.39 (*)    Calcium 8.8 (*)    AST 14 (*)    All other components within normal limits  CBC - Abnormal; Notable for the following components:   RBC 5.56 (*)    Hemoglobin 17.4 (*)    HCT 52.2 (*)    All other components within normal limits  URINALYSIS, ROUTINE W REFLEX MICROSCOPIC - Abnormal; Notable for the following components:   Color, Urine STRAW (*)    Glucose, UA >=500 (*)    Ketones, ur 20 (*)    All other components within normal limits  RESP PANEL BY RT-PCR (RSV, FLU A&B, COVID)  [id number]  LIPASE, BLOOD    EKG None  Radiology DG Chest Portable 1 View  Result Date: 03/02/2023 CLINICAL DATA:  Cough EXAM: PORTABLE CHEST 1 VIEW COMPARISON:  12/28/2022 FINDINGS: Mild bronchitic changes. Right middle lobe opacity. Normal cardiac size. Aortic atherosclerosis. No pneumothorax IMPRESSION: Right middle lobe opacity either reflecting atelectasis or pneumonia.  Radiographic follow-up to resolution is recommended Electronically Signed   By: Jasmine Pang M.D.   On: 03/02/2023 21:42    Procedures Procedures    Medications Ordered in ED Medications  sodium chloride 0.9 % bolus 1,000 mL (0 mLs Intravenous Hold 03/02/23 2031)  ketorolac (TORADOL) 15 MG/ML injection 15 mg (0 mg Intravenous Hold 03/02/23 2030)    ED Course/ Medical Decision Making/ A&P                                 Medical Decision Making 57 year old female with past medical history of diabetes and hypertension presents emergency department today with abdominal pain.  I will further evaluate patient here with basic labs including LFTs and  a lipase in addition to a urinalysis to eval for DKA, hepatobiliary pathology, pancreatitis, diverticulitis, colitis, or other intra-abdominal pathology with CT scan.  I will give patient Toradol for her pain.  I will reevaluate for ultimate disposition.  Her renal function appears within normal limits.  The patient's initial labs are unremarkable.  She refused an IV.  A noncontrast CT was ordered instead.  The patient eloped from the emergency department prior to reassessment.  Her x-ray did show findings concerning for pneumonia.  I will send the patient antibiotics.  She was sent a message on MyChart and was encouraged to return to the emergency department for worsening symptoms.  I did try to call the patient twice and was unable to get in touch with her.  Amount and/or Complexity of Data Reviewed Labs: ordered. Radiology: ordered.  Risk Prescription drug management.           Final Clinical Impression(s) / ED Diagnoses Final diagnoses:  Pneumonia due to infectious organism, unspecified laterality, unspecified part of lung    Rx / DC Orders ED Discharge Orders          Ordered    amoxicillin-clavulanate (AUGMENTIN) 875-125 MG tablet  Every 12 hours        03/02/23 2223    azithromycin (ZITHROMAX) 250 MG tablet  Daily         03/02/23 2223              Durwin Glaze, MD 03/02/23 2223

## 2023-03-02 NOTE — ED Notes (Signed)
Pt currently laying on the floor outside triage bathroom. This RN and another staff member watched pt sit down on knees and then lay on bathroom floor. Pt refuses to get up

## 2023-03-02 NOTE — ED Notes (Signed)
Pt currently refusing pain medication and IV. Pt stated "I want ya'll to figure out what's wrong with me rather than doping me up." Pt was explained to that pain medication is to mediate her symptoms while waiting to go to CT, and Staff explained that IV was needed for  CT with contrats. Pt stated "why don't you go look in my chart and see how many CT's I've already had, with and without contrast." Pt then put blanket over head and refused to answer staff on any further questions. MD notified.

## 2023-03-14 ENCOUNTER — Emergency Department (HOSPITAL_COMMUNITY): Payer: 59

## 2023-03-14 ENCOUNTER — Encounter (HOSPITAL_COMMUNITY): Payer: Self-pay

## 2023-03-14 ENCOUNTER — Inpatient Hospital Stay (HOSPITAL_COMMUNITY)
Admission: EM | Admit: 2023-03-14 | Discharge: 2023-03-22 | DRG: 189 | Discharge status: Against Medical Advice | Payer: 59 | Attending: Internal Medicine | Admitting: Internal Medicine

## 2023-03-14 ENCOUNTER — Other Ambulatory Visit: Payer: Self-pay

## 2023-03-14 DIAGNOSIS — Z5900 Homelessness unspecified: Secondary | ICD-10-CM

## 2023-03-14 DIAGNOSIS — Z794 Long term (current) use of insulin: Secondary | ICD-10-CM

## 2023-03-14 DIAGNOSIS — F17210 Nicotine dependence, cigarettes, uncomplicated: Secondary | ICD-10-CM | POA: Diagnosis present

## 2023-03-14 DIAGNOSIS — E43 Unspecified severe protein-calorie malnutrition: Secondary | ICD-10-CM | POA: Diagnosis present

## 2023-03-14 DIAGNOSIS — Z888 Allergy status to other drugs, medicaments and biological substances status: Secondary | ICD-10-CM

## 2023-03-14 DIAGNOSIS — Z91148 Patient's other noncompliance with medication regimen for other reason: Secondary | ICD-10-CM

## 2023-03-14 DIAGNOSIS — Z1152 Encounter for screening for COVID-19: Secondary | ICD-10-CM

## 2023-03-14 DIAGNOSIS — M797 Fibromyalgia: Secondary | ICD-10-CM | POA: Diagnosis present

## 2023-03-14 DIAGNOSIS — I4892 Unspecified atrial flutter: Secondary | ICD-10-CM | POA: Diagnosis present

## 2023-03-14 DIAGNOSIS — Z681 Body mass index (BMI) 19 or less, adult: Secondary | ICD-10-CM

## 2023-03-14 DIAGNOSIS — J9601 Acute respiratory failure with hypoxia: Principal | ICD-10-CM | POA: Diagnosis present

## 2023-03-14 DIAGNOSIS — Z5902 Unsheltered homelessness: Secondary | ICD-10-CM

## 2023-03-14 DIAGNOSIS — J441 Chronic obstructive pulmonary disease with (acute) exacerbation: Principal | ICD-10-CM | POA: Insufficient documentation

## 2023-03-14 DIAGNOSIS — Z882 Allergy status to sulfonamides status: Secondary | ICD-10-CM

## 2023-03-14 DIAGNOSIS — Z90710 Acquired absence of both cervix and uterus: Secondary | ICD-10-CM

## 2023-03-14 DIAGNOSIS — Z803 Family history of malignant neoplasm of breast: Secondary | ICD-10-CM

## 2023-03-14 DIAGNOSIS — Z66 Do not resuscitate: Secondary | ICD-10-CM | POA: Diagnosis present

## 2023-03-14 DIAGNOSIS — F319 Bipolar disorder, unspecified: Secondary | ICD-10-CM | POA: Diagnosis present

## 2023-03-14 DIAGNOSIS — Z91048 Other nonmedicinal substance allergy status: Secondary | ICD-10-CM

## 2023-03-14 DIAGNOSIS — F22 Delusional disorders: Secondary | ICD-10-CM | POA: Diagnosis present

## 2023-03-14 DIAGNOSIS — E119 Type 2 diabetes mellitus without complications: Secondary | ICD-10-CM

## 2023-03-14 DIAGNOSIS — Z833 Family history of diabetes mellitus: Secondary | ICD-10-CM

## 2023-03-14 DIAGNOSIS — T380X5A Adverse effect of glucocorticoids and synthetic analogues, initial encounter: Secondary | ICD-10-CM | POA: Diagnosis present

## 2023-03-14 DIAGNOSIS — E1165 Type 2 diabetes mellitus with hyperglycemia: Secondary | ICD-10-CM | POA: Diagnosis present

## 2023-03-14 DIAGNOSIS — Z91040 Latex allergy status: Secondary | ICD-10-CM

## 2023-03-14 DIAGNOSIS — I10 Essential (primary) hypertension: Secondary | ICD-10-CM | POA: Diagnosis present

## 2023-03-14 DIAGNOSIS — Z79899 Other long term (current) drug therapy: Secondary | ICD-10-CM

## 2023-03-14 DIAGNOSIS — F310 Bipolar disorder, current episode hypomanic: Secondary | ICD-10-CM | POA: Diagnosis not present

## 2023-03-14 DIAGNOSIS — B9789 Other viral agents as the cause of diseases classified elsewhere: Secondary | ICD-10-CM | POA: Diagnosis present

## 2023-03-14 DIAGNOSIS — Z885 Allergy status to narcotic agent status: Secondary | ICD-10-CM

## 2023-03-14 HISTORY — DX: Chronic obstructive pulmonary disease, unspecified: J44.9

## 2023-03-14 LAB — RESPIRATORY PANEL BY PCR

## 2023-03-14 LAB — TROPONIN I (HIGH SENSITIVITY)
Troponin I (High Sensitivity): 47 ng/L — ABNORMAL HIGH (ref <18)
Troponin I (High Sensitivity): 50 ng/L — ABNORMAL HIGH (ref <18)

## 2023-03-14 LAB — I-STAT VENOUS BLOOD GAS, ED
Acid-Base Excess: 3 mmol/L — ABNORMAL HIGH (ref 0.0–2.0)
Bicarbonate: 28.4 mmol/L — ABNORMAL HIGH (ref 20.0–28.0)
Calcium, Ion: 1.05 mmol/L — ABNORMAL LOW (ref 1.15–1.40)
HCT: 50 % — ABNORMAL HIGH (ref 36.0–46.0)
Hemoglobin: 17 g/dL — ABNORMAL HIGH (ref 12.0–15.0)
O2 Saturation: 99 %
Potassium: 3.8 mmol/L (ref 3.5–5.1)
Sodium: 135 mmol/L (ref 135–145)
TCO2: 30 mmol/L (ref 22–32)
pCO2, Ven: 44.6 mm[Hg] (ref 44–60)
pH, Ven: 7.411 (ref 7.25–7.43)
pO2, Ven: 140 mm[Hg] — ABNORMAL HIGH (ref 32–45)

## 2023-03-14 LAB — CBC WITH DIFFERENTIAL/PLATELET
Abs Immature Granulocytes: 0.02 10*3/uL (ref 0.00–0.07)
Basophils Absolute: 0.1 10*3/uL (ref 0.0–0.1)
Basophils Relative: 1 %
Eosinophils Absolute: 0.1 10*3/uL (ref 0.0–0.5)
Eosinophils Relative: 1 %
HCT: 50.1 % — ABNORMAL HIGH (ref 36.0–46.0)
Hemoglobin: 16.9 g/dL — ABNORMAL HIGH (ref 12.0–15.0)
Immature Granulocytes: 0 %
Lymphocytes Relative: 16 %
Lymphs Abs: 1.3 10*3/uL (ref 0.7–4.0)
MCH: 31.8 pg (ref 26.0–34.0)
MCHC: 33.7 g/dL (ref 30.0–36.0)
MCV: 94.4 fL (ref 80.0–100.0)
Monocytes Absolute: 0.6 10*3/uL (ref 0.1–1.0)
Monocytes Relative: 7 %
Neutro Abs: 6.1 10*3/uL (ref 1.7–7.7)
Neutrophils Relative %: 75 %
Platelets: 218 10*3/uL (ref 150–400)
RBC: 5.31 MIL/uL — ABNORMAL HIGH (ref 3.87–5.11)
RDW: 12.5 % (ref 11.5–15.5)
WBC: 8.2 10*3/uL (ref 4.0–10.5)
nRBC: 0 % (ref 0.0–0.2)

## 2023-03-14 LAB — CBG MONITORING, ED
Glucose-Capillary: 309 mg/dL — ABNORMAL HIGH (ref 70–99)
Glucose-Capillary: 374 mg/dL — ABNORMAL HIGH (ref 70–99)
Glucose-Capillary: 377 mg/dL — ABNORMAL HIGH (ref 70–99)

## 2023-03-14 LAB — COMPREHENSIVE METABOLIC PANEL
ALT: 12 U/L (ref 0–44)
AST: 12 U/L — ABNORMAL LOW (ref 15–41)
Albumin: 3.3 g/dL — ABNORMAL LOW (ref 3.5–5.0)
Alkaline Phosphatase: 74 U/L (ref 38–126)
Anion gap: 13 (ref 5–15)
BUN: 13 mg/dL (ref 6–20)
CO2: 29 mmol/L (ref 22–32)
Calcium: 9.1 mg/dL (ref 8.9–10.3)
Chloride: 95 mmol/L — ABNORMAL LOW (ref 98–111)
Creatinine, Ser: 0.47 mg/dL (ref 0.44–1.00)
GFR, Estimated: >60 mL/min (ref >60)
Glucose, Bld: 317 mg/dL — ABNORMAL HIGH (ref 70–99)
Potassium: 4.1 mmol/L (ref 3.5–5.1)
Sodium: 137 mmol/L (ref 135–145)
Total Bilirubin: 0.9 mg/dL (ref 0.3–1.2)
Total Protein: 5.7 g/dL — ABNORMAL LOW (ref 6.5–8.1)

## 2023-03-14 LAB — BETA-HYDROXYBUTYRIC ACID: Beta-Hydroxybutyric Acid: 1.73 mmol/L — ABNORMAL HIGH (ref 0.05–0.27)

## 2023-03-14 LAB — I-STAT CG4 LACTIC ACID, ED
Lactic Acid, Venous: 1.3 mmol/L (ref 0.5–1.9)
Lactic Acid, Venous: 0.8 mmol/L (ref 0.5–1.9)

## 2023-03-14 LAB — SARS CORONAVIRUS 2 BY RT PCR: SARS Coronavirus 2 by RT PCR: NEGATIVE

## 2023-03-14 LAB — BRAIN NATRIURETIC PEPTIDE: B Natriuretic Peptide: 85.3 pg/mL (ref 0.0–100.0)

## 2023-03-14 LAB — ETHANOL: Alcohol, Ethyl (B): <10 mg/dL (ref <10)

## 2023-03-14 LAB — LIPASE, BLOOD: Lipase: 26 U/L (ref 11–51)

## 2023-03-14 MED ORDER — ENOXAPARIN SODIUM 30 MG/0.3ML IJ SOSY
30.0000 mg | PREFILLED_SYRINGE | INTRAMUSCULAR | Status: DC
  Administered 2023-03-14 – 2023-03-21 (×8): 30 mg via SUBCUTANEOUS
  Filled 2023-03-14 (×8): qty 0.3

## 2023-03-14 MED ORDER — INSULIN ASPART 100 UNIT/ML IJ SOLN: 0.0000 [IU] | Freq: Three times a day (TID) | INTRAMUSCULAR | Status: DC

## 2023-03-14 MED ORDER — ALBUTEROL SULFATE (2.5 MG/3ML) 0.083% IN NEBU: 2.5000 mg | INHALATION_SOLUTION | RESPIRATORY_TRACT | Status: DC | PRN

## 2023-03-14 MED ORDER — ACETAMINOPHEN 650 MG RE SUPP: 650.0000 mg | Freq: Four times a day (QID) | RECTAL | Status: DC | PRN

## 2023-03-14 MED ORDER — LOSARTAN POTASSIUM 50 MG PO TABS
50.0000 mg | ORAL_TABLET | Freq: Every day | ORAL | Status: DC
  Administered 2023-03-15 – 2023-03-22 (×8): 50 mg via ORAL
  Filled 2023-03-14 (×8): qty 1

## 2023-03-14 MED ORDER — ACETYLCYSTEINE 20 % IN SOLN
2.0000 mL | Freq: Four times a day (QID) | RESPIRATORY_TRACT | Status: DC
  Filled 2023-03-14: qty 4

## 2023-03-14 MED ORDER — ACETYLCYSTEINE 10% NICU INHALATION SOLUTION: 2.0000 mL | Freq: Four times a day (QID) | RESPIRATORY_TRACT | Status: DC

## 2023-03-14 MED ORDER — SODIUM CHLORIDE 0.9% FLUSH
3.0000 mL | Freq: Two times a day (BID) | INTRAVENOUS | Status: DC
  Administered 2023-03-15 – 2023-03-22 (×15): 3 mL via INTRAVENOUS

## 2023-03-14 MED ORDER — GUAIFENESIN ER 600 MG PO TB12
600.0000 mg | ORAL_TABLET | Freq: Two times a day (BID) | ORAL | Status: DC | PRN
  Administered 2023-03-16 – 2023-03-17 (×2): 600 mg via ORAL
  Filled 2023-03-14 (×2): qty 1

## 2023-03-14 MED ORDER — IPRATROPIUM-ALBUTEROL 0.5-2.5 (3) MG/3ML IN SOLN
3.0000 mL | Freq: Once | RESPIRATORY_TRACT | Status: AC
  Administered 2023-03-14: 3 mL via RESPIRATORY_TRACT
  Filled 2023-03-14: qty 3

## 2023-03-14 MED ORDER — PANTOPRAZOLE SODIUM 40 MG PO TBEC
40.0000 mg | DELAYED_RELEASE_TABLET | Freq: Two times a day (BID) | ORAL | Status: DC
  Administered 2023-03-14 – 2023-03-22 (×16): 40 mg via ORAL
  Filled 2023-03-14 (×17): qty 1

## 2023-03-14 MED ORDER — IPRATROPIUM-ALBUTEROL 0.5-2.5 (3) MG/3ML IN SOLN: 3.0000 mL | Freq: Four times a day (QID) | RESPIRATORY_TRACT | Status: DC

## 2023-03-14 MED ORDER — SODIUM CHLORIDE 0.9 % IV BOLUS
1000.0000 mL | Freq: Once | INTRAVENOUS | Status: AC
  Administered 2023-03-14: 1000 mL via INTRAVENOUS

## 2023-03-14 MED ORDER — ACETYLCYSTEINE 20 % IN SOLN
2.0000 mL | Freq: Four times a day (QID) | RESPIRATORY_TRACT | Status: DC
  Administered 2023-03-14 – 2023-03-15 (×2): 2 mL via RESPIRATORY_TRACT
  Filled 2023-03-14 (×3): qty 4

## 2023-03-14 MED ORDER — ACETAMINOPHEN 325 MG PO TABS
650.0000 mg | ORAL_TABLET | Freq: Four times a day (QID) | ORAL | Status: DC | PRN
  Administered 2023-03-15 – 2023-03-21 (×8): 650 mg via ORAL
  Filled 2023-03-14 (×8): qty 2

## 2023-03-14 MED ORDER — IPRATROPIUM BROMIDE 0.02 % IN SOLN
0.5000 mg | Freq: Four times a day (QID) | RESPIRATORY_TRACT | Status: DC
  Filled 2023-03-14: qty 2.5

## 2023-03-14 MED ORDER — PREDNISONE 20 MG PO TABS
40.0000 mg | ORAL_TABLET | Freq: Every day | ORAL | Status: DC
  Administered 2023-03-15 – 2023-03-17 (×3): 40 mg via ORAL
  Filled 2023-03-14 (×3): qty 2

## 2023-03-14 MED ORDER — IPRATROPIUM-ALBUTEROL 0.5-2.5 (3) MG/3ML IN SOLN
3.0000 mL | Freq: Four times a day (QID) | RESPIRATORY_TRACT | Status: DC
  Administered 2023-03-14 – 2023-03-15 (×2): 3 mL via RESPIRATORY_TRACT
  Filled 2023-03-14 (×2): qty 3

## 2023-03-14 MED ORDER — ACETAMINOPHEN 325 MG PO TABS
650.0000 mg | ORAL_TABLET | Freq: Once | ORAL | Status: AC
  Administered 2023-03-14: 650 mg via ORAL
  Filled 2023-03-14: qty 2

## 2023-03-14 MED ORDER — UMECLIDINIUM BROMIDE 62.5 MCG/ACT IN AEPB: 1.0000 | INHALATION_SPRAY | Freq: Every day | RESPIRATORY_TRACT | Status: DC

## 2023-03-14 MED ORDER — ACETYLCYSTEINE 10% NICU INHALATION SOLUTION
2.0000 mL | Freq: Four times a day (QID) | RESPIRATORY_TRACT | Status: DC
  Filled 2023-03-14 (×2): qty 2

## 2023-03-14 MED ORDER — POLYETHYLENE GLYCOL 3350 17 G PO PACK: 17.0000 g | PACK | Freq: Every day | ORAL | Status: DC | PRN

## 2023-03-14 MED ORDER — IPRATROPIUM-ALBUTEROL 0.5-2.5 (3) MG/3ML IN SOLN: 3.0000 mL | RESPIRATORY_TRACT | Status: DC

## 2023-03-14 MED ORDER — METHYLPREDNISOLONE SODIUM SUCC 125 MG IJ SOLR
125.0000 mg | Freq: Once | INTRAMUSCULAR | Status: AC
  Administered 2023-03-14: 125 mg via INTRAVENOUS
  Filled 2023-03-14: qty 2

## 2023-03-14 MED ORDER — INSULIN ASPART 100 UNIT/ML IJ SOLN
0.0000 [IU] | Freq: Every day | INTRAMUSCULAR | Status: DC
  Administered 2023-03-14: 5 [IU] via SUBCUTANEOUS
  Administered 2023-03-15: 4 [IU] via SUBCUTANEOUS
  Administered 2023-03-16: 2 [IU] via SUBCUTANEOUS

## 2023-03-14 NOTE — ED Notes (Signed)
RT called. Humidified O2 requested. RT stated she would bring it.

## 2023-03-14 NOTE — ED Notes (Signed)
Pt locks herself in the bathroom. Pt does not respond to paramedic when paramedic calls to Pt. Security called. Hospitalist paged.

## 2023-03-14 NOTE — Progress Notes (Addendum)
  Nurse reporting that patient continues to take her nasal cannula off and she desats to 76%.  Per chart review patient has been admitted for acute hypoxic respiratory failure in the setting of COPD exacerbation. -Respiratory panel positive for rhinovirus. - Continue [place] oxygen 3 to 4 L to keep O2 sat above 92%. - Ordered bedside sitter to keep an eye on the patient to keep/maintaining nasal cannula oxygen. -Changing daily and his care Ellipta to DuoNeb every 6 hours scheduled and continue Proventil nebulizer every 2 hour as needed for wheezing and shortness of breath. -Continue Mucomyst nebulizer 4 times daily. -In the ED patient received Solu-Medrol 125 mg once.  Currently on diltiazem 40 mg daily. - Continue aspiration precaution, elevate head of the bed, pulmonary toiletry, flutter valve and spirometry.  Tereasa Coop, MD [facility] 03/14/2023, 9:11 PM

## 2023-03-14 NOTE — ED Notes (Addendum)
A[place]man messaged me via secure chat and asked why hte PT was not on O2.  Messaged back saying the PT had been placed on O2 at 1534

## 2023-03-14 NOTE — ED Triage Notes (Signed)
Pt [place] via [facility] [place]. Per EMS, pt was 87% on RA. Pt c/o lightheaded, dizziness, HA for unknown amount of time. Pt wants social work consult per EMS.

## 2023-03-14 NOTE — ED Notes (Signed)
Pt was placed back on O2.  Pt stated she had taken her O2 off due to needing to use the restroom.

## 2023-03-14 NOTE — ED Notes (Signed)
Pt agrees to wear O2.  RT called to consider humidified O2.

## 2023-03-14 NOTE — ED Notes (Signed)
Switched adult [place] to [facility] [place] due to Pt's small nostrils. Pt still refused to wear O2.

## 2023-03-14 NOTE — ED Notes (Signed)
Pt was offered a sandwich and refused.

## 2023-03-14 NOTE — ED Notes (Signed)
Called lab to add on respiratory ~20 pathogen.

## 2023-03-14 NOTE — H&P (Signed)
History and Physical   Courtney Bennett GMW:[id number] DOB: 19-Jul-1965 DOA: 03/14/2023  PCP: Patient, No Pcp Per   Patient coming from: Car/parking lot  Chief Complaint: Hypoxia  HPI: Courtney Bennett is a 57 y.o. female with medical history significant of inhaled, hypertension, diabetes, COPD, atrial flutter, bipolar, paranoid disorder presenting with hypoxia.  Patient [place] parking [place] where she has been living in her car.  She has been living in her car for over a year.  She states she has been without her medications for.  Reports some chest pain shortness of breath, lightheadedness for the past week or two, worse recently (some improvement after recent treatment for PNA.  Reports skin lesions of unknown etiology, does have some issue with bugs in the car where she is living. Denies fevers, chills, abdominal pain, constipation, diarrhea, nausea, vomiting.  ED Course: Vital signs in the ED notable for hypoxia requiring 2 L to maintain saturations, blood pressure in 140s 150s systolic, heart rate in the 80s to 130s, respiratory in the teens to 20s.  Lab workup included CMP with chloride 95, glucose Kos 317, protein 5.7, albumin 3.3.  CBC with hemoglobin stable at 16.9.  Lactic acid negative x 2.  Lipase normal.  Troponin flat at 47 and then 50 on repeat.  BNP normal.  COVID screen negative.  Urinalysis pending.  Blood cultures pending.  Ethanol level negative.  Beta hydroxybutyric acid mildly elevated.  VBG normal.  Chest x-ray with no acute abnormality and improved aeration from previous.  CT head showed no acute abnormality but did showed mild soft tissue swelling of the infraorbital area.  Patient received Tylenol, Solu-Medrol, DuoNeb in the ED.  Review of Systems: As per HPI otherwise all other systems reviewed and are negative.  Past Medical History:  Diagnosis Date   Bipolar 1 disorder (HCC)    COPD (chronic obstructive pulmonary disease) (HCC)    Diabetes mellitus     Fibromyalgia    Hypertension     Past Surgical History:  Procedure Laterality Date   ABDOMINAL HYSTERECTOMY  01/2019   BREAST BIOPSY Right    BREAST BIOPSY Right 02/18/2023   [place] RT BREAST BX W LOC DEV 1ST LESION IMG BX SPEC US GUIDE 02/18/2023 [facility]   CESAREAN SECTION     HERNIA REPAIR      Social History  reports that she quit smoking about 8 years ago. Her smoking use included cigarettes. She has never used smokeless tobacco. She reports current alcohol use. She reports that she does not use drugs.  Allergies  Allergen Reactions   Nickel Rash    Other Reaction(s): Unknown  Rash after ear piercing and navel piercings   Oxycodone-Acetaminophen Nausea And Vomiting   Metformin Diarrhea   Elemental Sulfur Hives, Rash and Other (See Comments)    "Burns the skin," also   Lamictal [Lamotrigine] Rash   Latex Itching and Rash   Sulfa Antibiotics Rash   Sulfur Hives and Rash    Other Reaction(s): Other  "Burns the skin," also   Tape Rash and Other (See Comments)    Irritates the skin    Family History  Problem Relation Age of Onset   Diabetes Father    Breast cancer Mother    Breast cancer Paternal Grandmother     Prior to Admission medications   Medication Sig Start Date End Date Taking? Authorizing Provider  albuterol (VENTOLIN HFA) 108 (90 Base) MCG/ACT inhaler Inhale 1-2 puffs into the lungs  every 6 (six) hours as needed. Patient taking differently: Inhale 1 puff into the lungs every 6 (six) hours as needed for wheezing or shortness of breath. 12/01/22  Yes Atway, Rayann N, DO  HUMALOG KWIKPEN 200 UNIT/ML KwikPen Inject 8 Units into the skin 3 (three) times daily with meals. 8 units 3 times per day before meals + correctional insulin MDD 50 units per day Subcutaneous for 30 days 12/01/22  Yes Atway, Rayann N, DO  losartan (COZAAR) 50 MG tablet Take 50 mg by mouth daily. 10/13/22  Yes [provider]  nitroGLYCERIN (NITROSTAT) 0.4 MG SL tablet Place 1  tablet (0.4 mg total) under the tongue every 5 (five) minutes as needed for chest pain. 12/31/22  Yes Willeen Niece, MD  pantoprazole (PROTONIX) 40 MG tablet Take 1 tablet (40 mg total) by mouth 2 (two) times daily. 12/31/22 03/14/23 Yes Khatri, Alverda Skeans, MD  TRESIBA FLEXTOUCH 100 UNIT/ML FlexTouch Pen Inject 5 Units into the skin daily. 01/13/23  Yes [provider]  aspirin EC 81 MG tablet Take 1 tablet (81 mg total) by mouth daily. Swallow whole. Patient not taking: Reported on 03/14/2023 01/01/23   Willeen Niece, MD  blood glucose meter kit and supplies Dispense based on patient and insurance preference. Use up to four times daily as directed. (FOR ICD-10 E10.9, E11.9). 12/15/19   Alwyn Ren, MD  Continuous Blood Gluc Sensor (FREESTYLE LIBRE 14 DAY SENSOR) MISC 1 application by Subdermal route See admin instructions. Check blood glucose 1-2 times daily. E11.65 05/11/19   CiriglianoJearld Lesch, DO  estrogens, conjugated, (PREMARIN) 0.3 MG tablet Take 1 tablet (0.3 mg total) by mouth daily. Patient not taking: Reported on 12/29/2022 06/16/19   Overton Mam, DO  gabapentin (NEURONTIN) 100 MG capsule Take 1 capsule (100 mg total) by mouth 2 (two) times daily. Patient not taking: Reported on 03/14/2023 12/31/22   Willeen Niece, MD  glucose blood (ACCU-CHEK AVIVA PLUS) test strip Use twice daily 12/01/22   Atway, Rayann N, DO  Insulin Pen Needle 32G X 4 MM MISC Use 2 times per day 12/01/22   Atway, Rayann N, DO  ketoconazole (NIZORAL) 2 % shampoo APPLY 1 APPLICATION TOPICALLY 2 (TWO) TIMES A WEEK. Patient not taking: Reported on 12/29/2022 07/13/19   Overton Mam, DO  umeclidinium bromide (INCRUSE ELLIPTA) 62.5 MCG/ACT AEPB Inhale 1 puff into the lungs daily. Patient not taking: Reported on 03/14/2023 12/02/22   Chauncey Mann, DO    Physical Exam: Vitals:   03/14/23 1530 03/14/23 1545 03/14/23 1553 03/14/23 1640  BP: (!) 161/65 (!) 155/67  (!) 161/79  Pulse: 88 90 (!) 106   Resp:  18 (!) 26 (!) 24   Temp:      TempSrc:      SpO2: (!) 78% 95% 96%   Weight:      Height:        Physical Exam Constitutional:      General: She is not in acute distress.    Comments: Thin/malnourished female  HENT:     Head: Normocephalic and atraumatic.     Mouth/Throat:     Mouth: Mucous membranes are moist.     Pharynx: Oropharynx is clear.  Eyes:     Extraocular Movements: Extraocular movements intact.     Pupils: Pupils are equal, round, and reactive to light.  Cardiovascular:     Rate and Rhythm: Regular rhythm. Tachycardia present.     Pulses: Normal pulses.     Heart sounds:  Normal heart sounds.  Pulmonary:     Effort: Pulmonary effort is normal. No respiratory distress.     Breath sounds: Normal breath sounds.  Abdominal:     General: Bowel sounds are normal. There is no distension.     Palpations: Abdomen is soft.     Tenderness: There is no abdominal tenderness.  Musculoskeletal:        General: No swelling or deformity.  Skin:    General: Skin is warm and dry.     Comments: Multiple small skin lesions/abrasions all in various stages of healing.  Unclear etiology.  Neurological:     General: No focal deficit present.     Mental Status: Mental status is at baseline.     Labs on Admission: I have personally reviewed following labs and imaging studies  CBC: Recent Labs  Lab 03/14/23 1315 03/14/23 1353  WBC 8.2  --   NEUTROABS 6.1  --   HGB 16.9* 17.0*  HCT 50.1* 50.0*  MCV 94.4  --   PLT 218  --     Basic Metabolic Panel: Recent Labs  Lab 03/14/23 1315 03/14/23 1353  NA 137 135  K 4.1 3.8  CL 95*  --   CO2 29  --   GLUCOSE 317*  --   BUN 13  --   CREATININE 0.47  --   CALCIUM 9.1  --     GFR: Estimated Creatinine Clearance: 53.9 mL/min (by C-G formula based on SCr of 0.47 mg/dL).  Liver Function Tests: Recent Labs  Lab 03/14/23 1315  AST 12*  ALT 12  ALKPHOS 74  BILITOT 0.9  PROT 5.7*  ALBUMIN 3.3*    Urine analysis:     Component Value Date/Time   COLORURINE STRAW (A) 03/02/2023 1918   APPEARANCEUR CLEAR 03/02/2023 1918   LABSPEC 1.015 03/02/2023 1918   PHURINE 6.0 03/02/2023 1918   GLUCOSEU >=500 (A) 03/02/2023 1918   HGBUR NEGATIVE 03/02/2023 1918   BILIRUBINUR NEGATIVE 03/02/2023 1918   KETONESUR 20 (A) 03/02/2023 1918   PROTEINUR NEGATIVE 03/02/2023 1918   UROBILINOGEN 0.2 03/02/2012 0159   NITRITE NEGATIVE 03/02/2023 1918   LEUKOCYTESUR NEGATIVE 03/02/2023 1918    Radiological Exams on Admission: CT Head Wo Contrast  Result Date: 03/14/2023 CLINICAL DATA:  Mental status change, unknown cause EXAM: CT HEAD WITHOUT CONTRAST TECHNIQUE: Contiguous axial images were obtained from the base of the skull through the vertex without intravenous contrast. RADIATION DOSE REDUCTION: This exam was performed according to the departmental dose-optimization program which includes automated exposure control, adjustment of the mA and/or kV according to patient size and/or use of iterative reconstruction technique. COMPARISON:  CT head 12/09/19 FINDINGS: Brain: No hemorrhage. No hydrocephalus. No extra-axial fluid collection. No CT evidence of an acute cortical infarct. No mass effect. No mass lesion. Vascular: No hyperdense vessel or unexpected calcification. Skull: Normal. Negative for fracture or focal lesion. Sinuses/Orbits: No middle ear or mastoid effusion. Paranasal sinuses are unremarkable. Orbits are unremarkable. Other: Mild soft tissue swelling in the infraorbital soft tissues on the left. Correlate with physical exam. IMPRESSION: 1.  No acute intracranial abnormality. 2. Mild soft tissue swelling in the infraorbital soft tissues on the left. Correlate with physical exam. Electronically Signed   By: Lorenza Cambridge M.D.   On: 03/14/2023 15:19   DG Chest Port 1 View  Result Date: 03/14/2023 CLINICAL DATA:  Hypoxia.  Lightheadedness and dizziness. EXAM: PORTABLE CHEST 1 VIEW COMPARISON:  Chest x-ray dated March 02, 2023. FINDINGS:  The heart size and mediastinal contours are within normal limits. Normal pulmonary vascularity. Improved aeration of the right middle lobe. No focal consolidation, pleural effusion, or pneumothorax. No acute osseous abnormality. IMPRESSION: 1. No active disease. Improved aeration of the right middle lobe. Electronically Signed   By: Obie Dredge M.D.   On: 03/14/2023 14:28    EKG: Independently reviewed.  Sinus rhythm at 85 beats minute.  Nonspecific T wave changes.  Suspected LVH with repolarization abnormality.  Assessment/Plan Principal Problem:   Acute respiratory failure with hypoxia (HCC) Active Problems:   Bipolar affective disorder, current episode hypomanic (HCC)   Paranoid (HCC)   Paroxysmal atrial flutter (HCC)   Homeless   DM (diabetes mellitus) (HCC)   Essential hypertension   COPD with acute exacerbation (HCC)   Acute respite failure with hypoxia COPD exacerbation > Presented with hypoxia around 87% on room air.  Improved with 2 L. > History of COPD and out of medications recently. - Received DuoNeb and Solu-Medrol in the ED. - Monitor on telemetry overnight with continuous pulse ox - Continue with scheduled Atrovent and as needed albuterol - Daily prednisone - Supplemental oxygen, wean as tolerated - Check respiratory viral panel to evaluate for etiology given there is no leukocytosis, no obvious pneumonia and negative for COVID-19 - Resume home increase - Supportive care  Hypertension - Resume home losartan  Diabetes - SSI  Bipolar disorder Paranoid disorder - Noted - Not currently prescribed medication for this  Paroxysmal atrial flutter - Noted - Currently sinus rhythm  Homeless > Living in car for some time.  - Consult with TOC for available resources.  DVT prophylaxis: Lovenox Code Status:   Full Family Communication:  None on admission Disposition Plan:   Patient is from:  Unhoused  Anticipated DC to:  Same as  above  Anticipated DC date:  1 to 3 days  Anticipated DC barriers: None  Consults called:  None Admission status:  Observation, telemetry  Severity of Illness: The appropriate patient status for this patient is OBSERVATION. Observation status is judged to be reasonable and necessary in order to provide the required intensity of service to ensure the patient's safety. The patient's presenting symptoms, physical exam findings, and initial radiographic and laboratory data in the context of their medical condition is felt to place them at decreased risk for further clinical deterioration. Furthermore, it is anticipated that the patient will be medically stable for discharge from the hospital within 2 midnights of admission.    Synetta Fail MD [facility]  How to contact the [facility] Attending or Consulting provider 7A - 7P or covering provider during after hours 7P -7A, for this patient?   Check the care team in [facility] and look for a) attending/consulting [facility] provider listed and b) the [facility] team listed Log into [email or web address] and use [place]'s universal password to access. If you do not have the password, please contact the hospital operator. Locate the [facility] provider you are looking for under [facility] and page to a number that you can be directly reached. If you still have difficulty reaching the provider, please page the [facility] (Director on Call) for the Hospitalists listed on amion for assistance.  03/14/2023, 4:56 PM

## 2023-03-14 NOTE — ED Notes (Signed)
Paged DR Loney Loh per RN , to speak to her about pt wanting to leave

## 2023-03-14 NOTE — ED Notes (Signed)
Pt had unhooked herself from the monitor.  Paramedic went back in and placed the Pt on the monitor.

## 2023-03-14 NOTE — ED Notes (Signed)
Pt's O2 would drop on the monitor with poor waveform.  Pt was talking to paramedic without difficulty.  Pt was drinking coffee without airway compromise.

## 2023-03-14 NOTE — ED Notes (Addendum)
Charge nurse, Fleet Contras, brought into discussion.  Pt stated she was leaving the ED to get food and would come back. Pt kept stating that if she left the ED that she would have to go back to the waiting room. Pt kept refusing to wear O2.

## 2023-03-14 NOTE — ED Notes (Signed)
Pt agrees to work with paramedic and agrees to take medication.

## 2023-03-14 NOTE — ED Notes (Signed)
Pt refused her medications. Pt kept saying "I'd rather die than go back to living in my car." Doctor made aware of this via secure chat.

## 2023-03-14 NOTE — ED Notes (Signed)
ED TO INPATIENT HANDOFF REPORT  ED Nurse Name and Phone #: Dahlia Client [phone]  S Name/Age/Gender Courtney Bennett 57 y.o. female Room/Bed: 009C/009C  Code Status   Code Status: Full Code  Home/SNF/Other Home - lives in car Patient oriented to: self, place, time, and situation Is this baseline? Yes   Triage Complete: Triage complete  Chief Complaint Acute respiratory failure with hypoxia (HCC) [J96.01]  Triage Note Pt arrived via [facility] [place] her [place] she [place]. Per EMS, pt was 87% on RA. Pt c/o lightheaded, dizziness, HA for unknown amount of time. Pt wants social work consult per EMS.   Allergies Allergies  Allergen Reactions   Nickel Rash    Other Reaction(s): Unknown  Rash after ear piercing and navel piercings   Oxycodone-Acetaminophen Nausea And Vomiting   Metformin Diarrhea   Elemental Sulfur Hives, Rash and Other (See Comments)    "Burns the skin," also   Lamictal [Lamotrigine] Rash   Latex Itching and Rash   Sulfa Antibiotics Rash   Sulfur Hives and Rash    Other Reaction(s): Other  "Burns the skin," also   Tape Rash and Other (See Comments)    Irritates the skin    Level of Care/Admitting Diagnosis ED Disposition     ED Disposition  Admit   Condition  --   Comment  Hospital Area: [facility] [[id number]]  Level of Care: Telemetry Medical [[id number]]  May place patient in observation at [facility] or [facility] if equivalent level of care is available:: No  Covid Evaluation: Asymptomatic - no recent exposure (last 10 days) testing not required  Diagnosis: Acute respiratory failure with hypoxia [facility]) [[id number]]  Admitting Physician: Synetta Fail [[id number]]  Attending Physician: Synetta Fail [phone]          B Medical/Surgery History Past Medical History:  Diagnosis Date   Bipolar 1 disorder (HCC)    COPD (chronic obstructive pulmonary disease) (HCC)    Diabetes mellitus    Fibromyalgia     Hypertension    Past Surgical History:  Procedure Laterality Date   ABDOMINAL HYSTERECTOMY  01/2019   BREAST BIOPSY Right    BREAST BIOPSY Right 02/18/2023   [place] RT BREAST BX W LOC DEV 1ST LESION IMG BX SPEC US GUIDE 02/18/2023 [facility] MAMMOGRAPHY   CESAREAN SECTION     HERNIA REPAIR       A IV Location/Drains/Wounds Patient Lines/Drains/Airways Status     Active Line/Drains/Airways     Name Placement date Placement time Site Days   Peripheral IV 03/14/23 20 G Anterior;Right Forearm 03/14/23  1313  Forearm  less than 1   Peripheral IV 03/14/23 20 G Anterior;Left Forearm 03/14/23  1325  Forearm  less than 1            Intake/Output Last 24 hours No intake or output data in the 24 hours ending 03/14/23 2302  Labs/Imaging Results for orders placed or performed during the hospital encounter of 03/14/23 (from the past 48 hour(s))  Troponin I (High Sensitivity)     Status: Abnormal   Collection Time: 03/14/23  1:15 AM  Result Value Ref Range   Troponin I (High Sensitivity) 47 (H) <18 ng/L    Comment: (NOTE) Elevated high sensitivity troponin I (hsTnI) values and significant  changes across serial measurements may suggest ACS but many other  chronic and acute conditions are known to elevate hsTnI results.  Refer to the "Links" section  for chest pain algorithms and additional  guidance. Performed at [facility], [address] [place], [place] [address]   SARS Coronavirus 2 by RT PCR (hospital order, performed in [facility] hospital lab) *cepheid single result test* Anterior Nasal Swab     Status: None   Collection Time: 03/14/23 12:56 PM   Specimen: Anterior Nasal Swab  Result Value Ref Range   SARS Coronavirus 2 by RT PCR NEGATIVE NEGATIVE    Comment: Performed at [facility], [address], [place], [place] [address]  CBG monitoring, ED     Status: Abnormal   Collection Time: 03/14/23 12:57 PM  Result Value Ref Range   Glucose-Capillary 309 (H) 70 -  99 mg/dL    Comment: Glucose reference range applies only to samples taken after fasting for at least 8 hours.  Ethanol     Status: None   Collection Time: 03/14/23  1:15 PM  Result Value Ref Range   Alcohol, Ethyl (B) <10 <10 mg/dL    Comment: (NOTE) Lowest detectable limit for serum alcohol is 10 mg/dL.  For medical purposes only. Performed at [facility], [address], [place], [place] [address]   CBC with Differential     Status: Abnormal   Collection Time: 03/14/23  1:15 PM  Result Value Ref Range   WBC 8.2 4.0 - 10.5 K/uL   RBC 5.31 (H) 3.87 - 5.11 MIL/uL   Hemoglobin 16.9 (H) 12.0 - 15.0 g/dL   HCT 21.3 (H) 08.6 - 57.8 %   MCV 94.4 80.0 - 100.0 fL   MCH 31.8 26.0 - 34.0 pg   MCHC 33.7 30.0 - 36.0 g/dL   RDW 46.9 62.9 - 52.8 %   Platelets 218 150 - 400 K/uL   nRBC 0.0 0.0 - 0.2 %   Neutrophils Relative % 75 %   Neutro Abs 6.1 1.7 - 7.7 K/uL   Lymphocytes Relative 16 %   Lymphs Abs 1.3 0.7 - 4.0 K/uL   Monocytes Relative 7 %   Monocytes Absolute 0.6 0.1 - 1.0 K/uL   Eosinophils Relative 1 %   Eosinophils Absolute 0.1 0.0 - 0.5 K/uL   Basophils Relative 1 %   Basophils Absolute 0.1 0.0 - 0.1 K/uL   Immature Granulocytes 0 %   Abs Immature Granulocytes 0.02 0.00 - 0.07 K/uL    Comment: Performed at [facility], [address] [place], [place] [address]  Beta-hydroxybutyric acid     Status: Abnormal   Collection Time: 03/14/23  1:15 PM  Result Value Ref Range   Beta-Hydroxybutyric Acid 1.73 (H) 0.05 - 0.27 mmol/L    Comment: Performed at [facility], [address], [place], [place] [address]  Comprehensive metabolic panel     Status: Abnormal   Collection Time: 03/14/23  1:15 PM  Result Value Ref Range   Sodium 137 135 - 145 mmol/L   Potassium 4.1 3.5 - 5.1 mmol/L   Chloride 95 (L) 98 - 111 mmol/L   CO2 29 22 - 32 mmol/L   Glucose, Bld 317 (H) 70 - 99 mg/dL    Comment: Glucose reference range applies only to samples taken after fasting  for at least 8 hours.   BUN 13 6 - 20 mg/dL   Creatinine, Ser 7.25 0.44 - 1.00 mg/dL   Calcium 9.1 8.9 - 36.6 mg/dL   Total Protein 5.7 (L) 6.5 - 8.1 g/dL   Albumin 3.3 (L) 3.5 -  5.0 g/dL   AST 12 (L) 15 - 41 U/L   ALT 12 0 - 44 U/L   Alkaline Phosphatase 74 38 - 126 U/L   Total Bilirubin 0.9 0.3 - 1.2 mg/dL   GFR, Estimated >09 >81 mL/min    Comment: (NOTE) Calculated using the CKD-EPI Creatinine Equation (2021)    Anion gap 13 5 - 15    Comment: Performed at [facility], [address], [place], [place] [address]  Lipase, blood     Status: None   Collection Time: 03/14/23  1:15 PM  Result Value Ref Range   Lipase 26 11 - 51 U/L    Comment: Performed at [facility], [address], [place], [place] [address]  I-Stat Lactic Acid     Status: None   Collection Time: 03/14/23  1:53 PM  Result Value Ref Range   Lactic Acid, Venous 1.3 0.5 - 1.9 mmol/L  I-Stat venous blood gas, ED     Status: Abnormal   Collection Time: 03/14/23  1:53 PM  Result Value Ref Range   pH, Ven 7.411 7.25 - 7.43   pCO2, Ven 44.6 44 - 60 mmHg   pO2, Ven 140 (H) 32 - 45 mmHg   Bicarbonate 28.4 (H) 20.0 - 28.0 mmol/L   TCO2 30 22 - 32 mmol/L   O2 Saturation 99 %   Acid-Base Excess 3.0 (H) 0.0 - 2.0 mmol/L   Sodium 135 135 - 145 mmol/L   Potassium 3.8 3.5 - 5.1 mmol/L   Calcium, Ion 1.05 (L) 1.15 - 1.40 mmol/L   HCT 50.0 (H) 36.0 - 46.0 %   Hemoglobin 17.0 (H) 12.0 - 15.0 g/dL   Sample type VENOUS   Troponin I (High Sensitivity)     Status: Abnormal   Collection Time: 03/14/23  3:22 PM  Result Value Ref Range   Troponin I (High Sensitivity) 50 (H) <18 ng/L    Comment: (NOTE) Elevated high sensitivity troponin I (hsTnI) values and significant  changes across serial measurements may suggest ACS but many other  chronic and acute conditions are known to elevate hsTnI results.  Refer to the "Links" section for chest pain algorithms and additional  guidance. Performed at [facility]  Lab, [address], [place], [place] [address]   Brain natriuretic peptide     Status: None   Collection Time: 03/14/23  3:22 PM  Result Value Ref Range   B Natriuretic Peptide 85.3 0.0 - 100.0 pg/mL    Comment: Performed at [facility], [address] [place], [place] [address]  I-Stat Lactic Acid     Status: None   Collection Time: 03/14/23  3:40 PM  Result Value Ref Range   Lactic Acid, Venous 0.8 0.5 - 1.9 mmol/L  Respiratory (~20 pathogens) panel by PCR     Status: Abnormal   Collection Time: 03/14/23  4:49 PM   Specimen: Nasopharyngeal Swab; Respiratory  Result Value Ref Range   Adenovirus NOT DETECTED NOT DETECTED   Coronavirus 229E NOT DETECTED NOT DETECTED    Comment: (NOTE) The Coronavirus on the Respiratory Panel, DOES NOT test for the novel  Coronavirus (2019 nCoV)    Coronavirus HKU1 NOT DETECTED NOT DETECTED   Coronavirus NL63 NOT DETECTED NOT DETECTED   Coronavirus OC43 NOT DETECTED NOT DETECTED   Metapneumovirus NOT DETECTED NOT DETECTED   Rhinovirus / Enterovirus DETECTED (A) NOT DETECTED   Influenza A NOT DETECTED NOT DETECTED   Influenza B  NOT DETECTED NOT DETECTED   Parainfluenza Virus 1 NOT DETECTED NOT DETECTED   Parainfluenza Virus 2 NOT DETECTED NOT DETECTED   Parainfluenza Virus 3 NOT DETECTED NOT DETECTED   Parainfluenza Virus 4 NOT DETECTED NOT DETECTED   Respiratory Syncytial Virus NOT DETECTED NOT DETECTED   Bordetella pertussis NOT DETECTED NOT DETECTED   Bordetella Parapertussis NOT DETECTED NOT DETECTED   Chlamydophila pneumoniae NOT DETECTED NOT DETECTED   Mycoplasma pneumoniae NOT DETECTED NOT DETECTED    Comment: Performed at [facility], [address], [place], [place] [address]  CBG monitoring, ED     Status: Abnormal   Collection Time: 03/14/23  6:15 PM  Result Value Ref Range   Glucose-Capillary 374 (H) 70 - 99 mg/dL    Comment: Glucose reference range applies only to samples taken after fasting for at least 8 hours.  CBG  monitoring, ED     Status: Abnormal   Collection Time: 03/14/23  9:06 PM  Result Value Ref Range   Glucose-Capillary 377 (H) 70 - 99 mg/dL    Comment: Glucose reference range applies only to samples taken after fasting for at least 8 hours.   CT Head Wo Contrast  Result Date: 03/14/2023 CLINICAL DATA:  Mental status change, unknown cause EXAM: CT HEAD WITHOUT CONTRAST TECHNIQUE: Contiguous axial images were obtained from the base of the skull through the vertex without intravenous contrast. RADIATION DOSE REDUCTION: This exam was performed according to the departmental dose-optimization program which includes automated exposure control, adjustment of the mA and/or kV according to patient size and/or use of iterative reconstruction technique. COMPARISON:  CT head 12/09/19 FINDINGS: Brain: No hemorrhage. No hydrocephalus. No extra-axial fluid collection. No CT evidence of an acute cortical infarct. No mass effect. No mass lesion. Vascular: No hyperdense vessel or unexpected calcification. Skull: Normal. Negative for fracture or focal lesion. Sinuses/Orbits: No middle ear or mastoid effusion. Paranasal sinuses are unremarkable. Orbits are unremarkable. Other: Mild soft tissue swelling in the infraorbital soft tissues on the left. Correlate with physical exam. IMPRESSION: 1.  No acute intracranial abnormality. 2. Mild soft tissue swelling in the infraorbital soft tissues on the left. Correlate with physical exam. Electronically Signed   By: Lorenza Cambridge M.D.   On: 03/14/2023 15:19   DG Chest Port 1 View  Result Date: 03/14/2023 CLINICAL DATA:  Hypoxia.  Lightheadedness and dizziness. EXAM: PORTABLE CHEST 1 VIEW COMPARISON:  Chest x-ray dated March 02, 2023. FINDINGS: The heart size and mediastinal contours are within normal limits. Normal pulmonary vascularity. Improved aeration of the right middle lobe. No focal consolidation, pleural effusion, or pneumothorax. No acute osseous abnormality. IMPRESSION:  1. No active disease. Improved aeration of the right middle lobe. Electronically Signed   By: Obie Dredge M.D.   On: 03/14/2023 14:28    Pending Labs Unresulted Labs (From admission, onward)     Start     Ordered   03/21/23 0500  Creatinine, serum  (enoxaparin (LOVENOX)    CrCl >/= 30 ml/min)  Weekly,   R     Comments: while on enoxaparin therapy    03/14/23 1656   03/15/23 0500  Comprehensive metabolic panel  Tomorrow morning,   R        03/14/23 1656   03/15/23 0500  CBC  Tomorrow morning,   R        03/14/23 1656   03/14/23 1256  Urinalysis, Routine w reflex microscopic -Urine, Clean Catch  Once,   URGENT  Question:  Specimen Source  Answer:  Urine, Clean Catch   03/14/23 1255   03/14/23 1256  Culture, blood (routine x 2)  BLOOD CULTURE X 2,   R (with STAT occurrences)      03/14/23 1255            Vitals/Pain Today's Vitals   03/14/23 1930 03/14/23 1945 03/14/23 2030 03/14/23 2119  BP: (!) 133/57 (!) 157/73 (!) 147/73 129/68  Pulse: 73 100 78 81  Resp: 15 (!) 23 13 16   Temp:      TempSrc:      SpO2: 100% (!) 76% 96% 97%  Weight:      Height:      PainSc:        Isolation Precautions Droplet precaution  Medications Medications  losartan (COZAAR) tablet 50 mg (has no administration in time range)  pantoprazole (PROTONIX) EC tablet 40 mg (40 mg Oral Given 03/14/23 2130)  enoxaparin (LOVENOX) injection 30 mg (30 mg Subcutaneous Given 03/14/23 2131)  predniSONE (DELTASONE) tablet 40 mg (has no administration in time range)  sodium chloride flush (NS) 0.9 % injection 3 mL (has no administration in time range)  acetaminophen (TYLENOL) tablet 650 mg (has no administration in time range)    Or  acetaminophen (TYLENOL) suppository 650 mg (has no administration in time range)  polyethylene glycol (MIRALAX / GLYCOLAX) packet 17 g (has no administration in time range)  insulin aspart (novoLOG) injection 0-9 Units (has no administration in time range)  insulin  aspart (novoLOG) injection 0-5 Units (5 Units Subcutaneous Given 03/14/23 2128)  guaiFENesin (MUCINEX) 12 hr tablet 600 mg (has no administration in time range)  albuterol (PROVENTIL) (2.5 MG/3ML) 0.083% nebulizer solution 2.5 mg (has no administration in time range)  ipratropium-albuterol (DUONEB) 0.5-2.5 (3) MG/3ML nebulizer solution 3 mL (3 mLs Nebulization Given 03/14/23 2134)  acetylcysteine (MUCOMYST) 20 % nebulizer / oral solution 2 mL (has no administration in time range)  methylPREDNISolone sodium succinate (SOLU-MEDROL) 125 mg/2 mL injection 125 mg (125 mg Intravenous Given 03/14/23 1356)  ipratropium-albuterol (DUONEB) 0.5-2.5 (3) MG/3ML nebulizer solution 3 mL (3 mLs Nebulization Given 03/14/23 1359)  acetaminophen (TYLENOL) tablet 650 mg (650 mg Oral Given 03/14/23 1716)  sodium chloride 0.9 % bolus 1,000 mL (0 mLs Intravenous Stopped 03/14/23 1817)    Mobility walks     Focused Assessments Pulmonary Assessment Handoff:  Lung sounds:   O2 Device: Nasal Cannula O2 Flow Rate (L/min): 4 L/min    R Recommendations: See Admitting Provider Note  Report given to:   Additional Notes: Pt was agitated. Documented the situation in full. Charge nurse down here was brought in on the situation.  Pt is now sleeping in bed.

## 2023-03-14 NOTE — ED Provider Notes (Signed)
[place] EMERGENC[facility] DEPARTMENT AT [facility] Provider Note   CSN: [id number] Arrival date & time: 03/14/23  1253     History  Chief Complaint  Patient presents with   hypoxia    Courtney Bennett is a 57 y.o. female with a past medical history significant for homelessness, tobacco abuse, diabetes, bipolar affective disorder, paroxysmal atrial flutter, fibromyalgia, COPD, and hypertension who presents to the ED after [place].  Patient has been living in her car for over a year.  Per EMS patient found to be hyperglycemic and hypoxic.  Patient notes she is not taking her medications recently due to inability to keep medication in car.  Admits to chest pain, shortness of breath, lightheadedness, and headache.  Patient unable to tell me how long the symptoms have been going on for.  Patient not very forthcoming with history or current symptoms.  Level 5 caveat.  Patient seen in the ED on 10/1 where she eloped from the ED.  Workup prior to elopement showed chest x-ray concerning for pneumonia.  Patient was sent home with Augmentin and azithromycin.  She notes she finished her azithromycin however, only took half the Augmentin due to GI upset.  History obtained from patient, EMS, and past medical records. No interpreter used during encounter.       Home Medications Prior to Admission medications   Medication Sig Start Date End Date Taking? Authorizing Provider  albuterol (VENTOLIN HFA) 108 (90 Base) MCG/ACT inhaler Inhale 1-2 puffs into the lungs every 6 (six) hours as needed. Patient taking differently: Inhale 1 puff into the lungs every 6 (six) hours as needed for wheezing or shortness of breath. 12/01/22  Yes Atway, Rayann N, DO  HUMALOG KWIKPEN 200 UNIT/ML KwikPen Inject 8 Units into the skin 3 (three) times daily with meals. 8 units 3 times per day before meals + correctional insulin MDD 50 units per day Subcutaneous for 30 days 12/01/22  Yes Atway, Rayann  N, DO  losartan (COZAAR) 50 MG tablet Take 50 mg by mouth daily. 10/13/22  Yes [provider]  nitroGLYCERIN (NITROSTAT) 0.4 MG SL tablet Place 1 tablet (0.4 mg total) under the tongue every 5 (five) minutes as needed for chest pain. 12/31/22  Yes Willeen Niece, MD  pantoprazole (PROTONIX) 40 MG tablet Take 1 tablet (40 mg total) by mouth 2 (two) times daily. 12/31/22 03/14/23 Yes Khatri, Alverda Skeans, MD  TRESIBA FLEXTOUCH 100 UNIT/ML FlexTouch Pen Inject 5 Units into the skin daily. 01/13/23  Yes [provider]  aspirin EC 81 MG tablet Take 1 tablet (81 mg total) by mouth daily. Swallow whole. Patient not taking: Reported on 03/14/2023 01/01/23   Willeen Niece, MD  blood glucose meter kit and supplies Dispense based on patient and insurance preference. Use up to four times daily as directed. (FOR ICD-10 E10.9, E11.9). 12/15/19   Alwyn Ren, MD  Continuous Blood Gluc Sensor (FREESTYLE LIBRE 14 DAY SENSOR) MISC 1 application by Subdermal route See admin instructions. Check blood glucose 1-2 times daily. E11.65 05/11/19   CiriglianoJearld Lesch, DO  estrogens, conjugated, (PREMARIN) 0.3 MG tablet Take 1 tablet (0.3 mg total) by mouth daily. Patient not taking: Reported on 12/29/2022 06/16/19   Overton Mam, DO  gabapentin (NEURONTIN) 100 MG capsule Take 1 capsule (100 mg total) by mouth 2 (two) times daily. Patient not taking: Reported on 03/14/2023 12/31/22   Willeen Niece, MD  glucose blood (ACCU-CHEK AVIVA PLUS) test strip  Use twice daily 12/01/22   Atway, Rayann N, DO  Insulin Pen Needle 32G X 4 MM MISC Use 2 times per day 12/01/22   Atway, Rayann N, DO  ketoconazole (NIZORAL) 2 % shampoo APPLY 1 APPLICATION TOPICALLY 2 (TWO) TIMES A WEEK. Patient not taking: Reported on 12/29/2022 07/13/19   Overton Mam, DO  umeclidinium bromide (INCRUSE ELLIPTA) 62.5 MCG/ACT AEPB Inhale 1 puff into the lungs daily. Patient not taking: Reported on 03/14/2023 12/02/22   Atway, Rayann N, DO       Allergies    Nickel, Oxycodone-acetaminophen, Metformin, Elemental sulfur, Lamictal [lamotrigine], Latex, Sulfa antibiotics, Sulfur, and Tape    Review of Systems   Review of Systems  Constitutional:  Negative for fever.  Respiratory:  Positive for shortness of breath.   Cardiovascular:  Positive for chest pain.  Gastrointestinal:  Positive for nausea.    Physical Exam Updated Vital Signs BP (!) 161/79   Pulse (!) 106   Temp 98.1 F (36.7 C) (Oral)   Resp (!) 24   Ht 5\' 3"  (1.6 m)   Wt 44 kg   LMP 11/24/2014   SpO2 96%   BMI 17.18 kg/m  Physical Exam Vitals and nursing note reviewed.  Constitutional:      General: She is not in acute distress.    Appearance: She is not ill-appearing.  HENT:     Head: Normocephalic.  Eyes:     Pupils: Pupils are equal, round, and reactive to light.  Cardiovascular:     Rate and Rhythm: Normal rate and regular rhythm.     Pulses: Normal pulses.     Heart sounds: Normal heart sounds. No murmur heard.    No friction rub. No gallop.  Pulmonary:     Effort: Pulmonary effort is normal.     Breath sounds: Wheezing present.  Abdominal:     General: Abdomen is flat. There is no distension.     Palpations: Abdomen is soft.     Tenderness: There is no abdominal tenderness. There is no guarding or rebound.  Musculoskeletal:        General: Normal range of motion.     Cervical back: Neck supple.  Skin:    General: Skin is warm and dry.  Neurological:     General: No focal deficit present.     Mental Status: She is alert.  Psychiatric:        Mood and Affect: Mood normal.        Behavior: Behavior normal.     ED Results / Procedures / Treatments   Labs (all labs ordered are listed, but only abnormal results are displayed) Labs Reviewed  CBC WITH DIFFERENTIAL/PLATELET - Abnormal; Notable for the following components:      Result Value   RBC 5.31 (*)    Hemoglobin 16.9 (*)    HCT 50.1 (*)    All other components within normal  limits  BETA-HYDROXYBUTYRIC ACID - Abnormal; Notable for the following components:   Beta-Hydroxybutyric Acid 1.73 (*)    All other components within normal limits  COMPREHENSIVE METABOLIC PANEL - Abnormal; Notable for the following components:   Chloride 95 (*)    Glucose, Bld 317 (*)    Total Protein 5.7 (*)    Albumin 3.3 (*)    AST 12 (*)    All other components within normal limits  I-STAT VENOUS BLOOD GAS, ED - Abnormal; Notable for the following components:   pO2, Ven 140 (*)  Bicarbonate 28.4 (*)    Acid-Base Excess 3.0 (*)    Calcium, Ion 1.05 (*)    HCT 50.0 (*)    Hemoglobin 17.0 (*)    All other components within normal limits  CBG MONITORING, ED - Abnormal; Notable for the following components:   Glucose-Capillary 309 (*)    All other components within normal limits  TROPONIN I (HIGH SENSITIVITY) - Abnormal; Notable for the following components:   Troponin I (High Sensitivity) 47 (*)    All other components within normal limits  TROPONIN I (HIGH SENSITIVITY) - Abnormal; Notable for the following components:   Troponin I (High Sensitivity) 50 (*)    All other components within normal limits  SARS CORONAVIRUS 2 BY RT PCR  CULTURE, BLOOD (ROUTINE X 2)  CULTURE, BLOOD (ROUTINE X 2)  RESPIRATORY PANEL BY PCR  ETHANOL  LIPASE, BLOOD  BRAIN NATRIURETIC PEPTIDE  URINALYSIS, ROUTINE W REFLEX MICROSCOPIC  I-STAT CG4 LACTIC ACID, ED  I-STAT CG4 LACTIC ACID, ED    EKG EKG Interpretation Date/Time:  Sunday March 14 2023 14:13:03 EDT Ventricular Rate:  85 PR Interval:  185 QRS Duration:  113 QT Interval:  366 QTC Calculation: 436 R Axis:   73  Text Interpretation: Sinus rhythm Right atrial enlargement LVH with IVCD and secondary repol abnrm Anterior ST elevation, probably due to LVH Abnormal ECG Confirmed by Gerhard Munch ([id number]) on 03/14/2023 2:58:17 PM  Radiology CT Head Wo Contrast  Result Date: 03/14/2023 CLINICAL DATA:  Mental status change, unknown  cause EXAM: CT HEAD WITHOUT CONTRAST TECHNIQUE: Contiguous axial images were obtained from the base of the skull through the vertex without intravenous contrast. RADIATION DOSE REDUCTION: This exam was performed according to the departmental dose-optimization program which includes automated exposure control, adjustment of the mA and/or kV according to patient size and/or use of iterative reconstruction technique. COMPARISON:  CT head 12/09/19 FINDINGS: Brain: No hemorrhage. No hydrocephalus. No extra-axial fluid collection. No CT evidence of an acute cortical infarct. No mass effect. No mass lesion. Vascular: No hyperdense vessel or unexpected calcification. Skull: Normal. Negative for fracture or focal lesion. Sinuses/Orbits: No middle ear or mastoid effusion. Paranasal sinuses are unremarkable. Orbits are unremarkable. Other: Mild soft tissue swelling in the infraorbital soft tissues on the left. Correlate with physical exam. IMPRESSION: 1.  No acute intracranial abnormality. 2. Mild soft tissue swelling in the infraorbital soft tissues on the left. Correlate with physical exam. Electronically Signed   By: Lorenza Cambridge M.D.   On: 03/14/2023 15:19   DG Chest Port 1 View  Result Date: 03/14/2023 CLINICAL DATA:  Hypoxia.  Lightheadedness and dizziness. EXAM: PORTABLE CHEST 1 VIEW COMPARISON:  Chest x-ray dated March 02, 2023. FINDINGS: The heart size and mediastinal contours are within normal limits. Normal pulmonary vascularity. Improved aeration of the right middle lobe. No focal consolidation, pleural effusion, or pneumothorax. No acute osseous abnormality. IMPRESSION: 1. No active disease. Improved aeration of the right middle lobe. Electronically Signed   By: Obie Dredge M.D.   On: 03/14/2023 14:28    Procedures .Critical Care  Performed by: Mannie Stabile, PA-C Authorized by: Mannie Stabile, PA-C   Critical care provider statement:    Critical care time (minutes):  34   Critical  care was necessary to treat or prevent imminent or life-threatening deterioration of the following conditions:  Respiratory failure   Critical care was time spent personally by me on the following activities:  Development of treatment plan with patient or  surrogate, discussions with consultants, evaluation of patient's response to treatment, examination of patient, ordering and review of laboratory studies, ordering and review of radiographic studies, ordering and performing treatments and interventions, pulse oximetry, re-evaluation of patient's condition and review of old charts   I assumed direction of critical care for this patient from another provider in my specialty: no     Care discussed with: admitting provider       Medications Ordered in ED Medications  acetaminophen (TYLENOL) tablet 650 mg (has no administration in time range)  losartan (COZAAR) tablet 50 mg (has no administration in time range)  pantoprazole (PROTONIX) EC tablet 40 mg (has no administration in time range)  umeclidinium bromide (INCRUSE ELLIPTA) 62.5 MCG/ACT 1 puff (has no administration in time range)  enoxaparin (LOVENOX) injection 40 mg (has no administration in time range)  predniSONE (DELTASONE) tablet 40 mg (has no administration in time range)  ipratropium (ATROVENT) nebulizer solution 0.5 mg (has no administration in time range)  albuterol (PROVENTIL) (2.5 MG/3ML) 0.083% nebulizer solution 2.5 mg (has no administration in time range)  sodium chloride flush (NS) 0.9 % injection 3 mL (has no administration in time range)  acetaminophen (TYLENOL) tablet 650 mg (has no administration in time range)    Or  acetaminophen (TYLENOL) suppository 650 mg (has no administration in time range)  polyethylene glycol (MIRALAX / GLYCOLAX) packet 17 g (has no administration in time range)  sodium chloride 0.9 % bolus 1,000 mL (has no administration in time range)  methylPREDNISolone sodium succinate (SOLU-MEDROL) 125 mg/2 mL  injection 125 mg (125 mg Intravenous Given 03/14/23 1356)  ipratropium-albuterol (DUONEB) 0.5-2.5 (3) MG/3ML nebulizer solution 3 mL (3 mLs Nebulization Given 03/14/23 1359)    ED Course/ Medical Decision Making/ A&P Clinical Course as of 03/14/23 1657  Sun Mar 14, 2023  1413 Lactic Acid, Venous: 1.3 [CA]  1413 WBC: 8.2 [CA]  1413 pH, Ven: 7.411 [CA]  1414 pCO2, Ven: 44.6 [CA]  1432 Anion gap: 13 [CA]  1432 Glucose(!): 317 [CA]  1432 CO2: 29 [CA]  1612 Reassessed patient to discuss CT findings about edema to left infraorbital. Patient became frustrated and states she does not now.  Patient will not share any information with me.  Does not appear to be overly tender on exam.  [CA]    Clinical Course User Index [CA] Mannie Stabile, PA-C                                 Medical Decision Making Amount and/or Complexity of Data Reviewed Independent Historian: EMS External Data Reviewed: notes. Labs: ordered. Decision-making details documented in ED Course. Radiology: ordered and independent interpretation performed. Decision-making details documented in ED Course. ECG/medicine tests: ordered and independent interpretation performed. Decision-making details documented in ED Course.  Risk OTC drugs. Prescription drug management. Decision regarding hospitalization.   This patient presents to the ED for concern of SOB, this involves an extensive number of treatment options, and is a complaint that carries with it a high risk of complications and morbidity.  The differential diagnosis includes COPD exacerbation, ACS, PNA, PE, etc  57 year old female with history of homelessness who lives in a car in [place] church parking [place] presents to the ED where she was found to be hyperglycemic and hypoxic.  Patient recently treated for pneumonia on 10/1.  Patient admits to chest pain, shortness of breath, headache, and lightheadedness for unknown duration of time.  Patient not very forthcoming with  information.  Labs ordered.  CT head for headache.  Chest x-ray to rule out PNA given recent diagnosis. Patient hypoxic upon arrival and placed on 2L [place]. Oral hydration started for hyperglycemia pending labs for DKA given IVF shortage.   CBC with no leukocytosis.  Elevated hemoglobin at 16.9.  CMP significant for hyperglycemia at 317.  No anion gap.  Normal bicarb.  Low suspicion for DKA.  Normal renal function.  Lipase normal.  Low suspicion for pancreatitis.  Ethanol level within normal limits.  VBG with normal pH. Troponin elevated at 47>50. EKG NSR, evidence of LVH. Upon reassessment, patient denies any chest pain. Low suspicion for ACS. BNP normal. Low suspicion for CHF.  Chest x-ray negative for any acute abnormalities.  No evidence of pneumonia.  CT head personally reviewed and interpreted which is negative for any intracranial abnormalities.  Does demonstrate mild soft tissue swelling in the infraorbital soft tissues on the left.  See note above.    Patient will require admission for respiratory failure likely secondary to COPD exacerbation.  4:52 PM Discussed with Dr. Alinda Money with [facility] who agrees to admit patient  Co morbidities that complicate the patient evaluation HTN, COPD Cardiac Monitoring: / EKG:  The patient was maintained on a cardiac monitor.  I personally viewed and interpreted the cardiac monitored which showed an underlying rhythm of: NSR  Social Determinants of Health:  homelessness  Test / Admission - Considered:  CTA chest; however, PE thought to be less likely.   Discussed with Dr. Jeraldine Loots who agrees with assessment and plan.        Final Clinical Impression(s) / ED Diagnoses Final diagnoses:  COPD exacerbation [facility])    Rx / DC Orders ED Discharge Orders     None         Jesusita Oka 03/14/23 1657    Gerhard Munch, MD 03/14/23 2004

## 2023-03-14 NOTE — ED Notes (Signed)
Pt was informed that her son called and that he wanted her to call back.  PT stated she would call him.

## 2023-03-14 NOTE — ED Notes (Addendum)
Pt's SPO2 would not read with good waveform.  Pt placed on O2 at 2lpm via [place].  Provider notified secure chat. Pt denied increased SHOB.

## 2023-03-15 DIAGNOSIS — J9601 Acute respiratory failure with hypoxia: Secondary | ICD-10-CM | POA: Diagnosis present

## 2023-03-15 DIAGNOSIS — F310 Bipolar disorder, current episode hypomanic: Secondary | ICD-10-CM | POA: Diagnosis not present

## 2023-03-15 DIAGNOSIS — Z833 Family history of diabetes mellitus: Secondary | ICD-10-CM | POA: Diagnosis not present

## 2023-03-15 DIAGNOSIS — Z885 Allergy status to narcotic agent status: Secondary | ICD-10-CM | POA: Diagnosis not present

## 2023-03-15 DIAGNOSIS — Z888 Allergy status to other drugs, medicaments and biological substances status: Secondary | ICD-10-CM | POA: Diagnosis not present

## 2023-03-15 DIAGNOSIS — F17210 Nicotine dependence, cigarettes, uncomplicated: Secondary | ICD-10-CM | POA: Diagnosis present

## 2023-03-15 DIAGNOSIS — I4892 Unspecified atrial flutter: Secondary | ICD-10-CM

## 2023-03-15 DIAGNOSIS — Z794 Long term (current) use of insulin: Secondary | ICD-10-CM | POA: Diagnosis not present

## 2023-03-15 DIAGNOSIS — Z79899 Other long term (current) drug therapy: Secondary | ICD-10-CM | POA: Diagnosis not present

## 2023-03-15 DIAGNOSIS — Z66 Do not resuscitate: Secondary | ICD-10-CM | POA: Diagnosis present

## 2023-03-15 DIAGNOSIS — J441 Chronic obstructive pulmonary disease with (acute) exacerbation: Secondary | ICD-10-CM | POA: Diagnosis present

## 2023-03-15 DIAGNOSIS — Z5900 Homelessness unspecified: Secondary | ICD-10-CM | POA: Diagnosis not present

## 2023-03-15 DIAGNOSIS — Z803 Family history of malignant neoplasm of breast: Secondary | ICD-10-CM | POA: Diagnosis not present

## 2023-03-15 DIAGNOSIS — Z90710 Acquired absence of both cervix and uterus: Secondary | ICD-10-CM | POA: Diagnosis not present

## 2023-03-15 DIAGNOSIS — Z91048 Other nonmedicinal substance allergy status: Secondary | ICD-10-CM | POA: Diagnosis not present

## 2023-03-15 DIAGNOSIS — Z882 Allergy status to sulfonamides status: Secondary | ICD-10-CM | POA: Diagnosis not present

## 2023-03-15 DIAGNOSIS — Z91040 Latex allergy status: Secondary | ICD-10-CM | POA: Diagnosis not present

## 2023-03-15 DIAGNOSIS — F319 Bipolar disorder, unspecified: Secondary | ICD-10-CM | POA: Diagnosis present

## 2023-03-15 DIAGNOSIS — Z5902 Unsheltered homelessness: Secondary | ICD-10-CM | POA: Diagnosis not present

## 2023-03-15 DIAGNOSIS — E43 Unspecified severe protein-calorie malnutrition: Secondary | ICD-10-CM | POA: Diagnosis present

## 2023-03-15 DIAGNOSIS — I10 Essential (primary) hypertension: Secondary | ICD-10-CM | POA: Diagnosis present

## 2023-03-15 DIAGNOSIS — M797 Fibromyalgia: Secondary | ICD-10-CM | POA: Diagnosis present

## 2023-03-15 DIAGNOSIS — E1165 Type 2 diabetes mellitus with hyperglycemia: Secondary | ICD-10-CM | POA: Diagnosis present

## 2023-03-15 DIAGNOSIS — Z1152 Encounter for screening for COVID-19: Secondary | ICD-10-CM | POA: Diagnosis not present

## 2023-03-15 DIAGNOSIS — Z681 Body mass index (BMI) 19 or less, adult: Secondary | ICD-10-CM | POA: Diagnosis not present

## 2023-03-15 DIAGNOSIS — T380X5A Adverse effect of glucocorticoids and synthetic analogues, initial encounter: Secondary | ICD-10-CM | POA: Diagnosis present

## 2023-03-15 LAB — GLUCOSE, CAPILLARY
Glucose-Capillary: 220 mg/dL — ABNORMAL HIGH (ref 70–99)
Glucose-Capillary: 274 mg/dL — ABNORMAL HIGH (ref 70–99)
Glucose-Capillary: 314 mg/dL — ABNORMAL HIGH (ref 70–99)
Glucose-Capillary: 322 mg/dL — ABNORMAL HIGH (ref 70–99)
Glucose-Capillary: 344 mg/dL — ABNORMAL HIGH (ref 70–99)
Glucose-Capillary: 449 mg/dL — ABNORMAL HIGH (ref 70–99)
Glucose-Capillary: 450 mg/dL — ABNORMAL HIGH (ref 70–99)
Glucose-Capillary: 535 mg/dL (ref 70–99)

## 2023-03-15 LAB — CBC
HCT: 43.6 % (ref 36.0–46.0)
Hemoglobin: 14.4 g/dL (ref 12.0–15.0)
MCH: 31.3 pg (ref 26.0–34.0)
MCHC: 33 g/dL (ref 30.0–36.0)
MCV: 94.8 fL (ref 80.0–100.0)
Platelets: 192 10*3/uL (ref 150–400)
RBC: 4.6 MIL/uL (ref 3.87–5.11)
RDW: 12.7 % (ref 11.5–15.5)
WBC: 10.6 10*3/uL — ABNORMAL HIGH (ref 4.0–10.5)
nRBC: 0 % (ref 0.0–0.2)

## 2023-03-15 LAB — COMPREHENSIVE METABOLIC PANEL
ALT: 7 U/L (ref 0–44)
AST: 15 U/L (ref 15–41)
Albumin: 2.8 g/dL — ABNORMAL LOW (ref 3.5–5.0)
Alkaline Phosphatase: 60 U/L (ref 38–126)
Anion gap: 9 (ref 5–15)
BUN: 22 mg/dL — ABNORMAL HIGH (ref 6–20)
CO2: 25 mmol/L (ref 22–32)
Calcium: 8.8 mg/dL — ABNORMAL LOW (ref 8.9–10.3)
Chloride: 98 mmol/L (ref 98–111)
Creatinine, Ser: 0.6 mg/dL (ref 0.44–1.00)
GFR, Estimated: >60 mL/min (ref >60)
Glucose, Bld: 276 mg/dL — ABNORMAL HIGH (ref 70–99)
Potassium: 4.1 mmol/L (ref 3.5–5.1)
Sodium: 132 mmol/L — ABNORMAL LOW (ref 135–145)
Total Bilirubin: 0.4 mg/dL (ref 0.3–1.2)
Total Protein: 5.2 g/dL — ABNORMAL LOW (ref 6.5–8.1)

## 2023-03-15 LAB — HEPATITIS PANEL, ACUTE
HCV Ab: NONREACTIVE
Hep A IgM: NONREACTIVE
Hep B C IgM: NONREACTIVE
Hepatitis B Surface Ag: NONREACTIVE

## 2023-03-15 LAB — HIV ANTIBODY (ROUTINE TESTING W REFLEX): HIV Screen 4th Generation wRfx: NONREACTIVE

## 2023-03-15 LAB — MRSA NEXT GEN BY PCR, NASAL: MRSA by PCR Next Gen: NOT DETECTED

## 2023-03-15 MED ORDER — INSULIN ASPART 100 UNIT/ML IJ SOLN
10.0000 [IU] | Freq: Once | INTRAMUSCULAR | Status: AC
  Administered 2023-03-15: 10 [IU] via SUBCUTANEOUS

## 2023-03-15 MED ORDER — INSULIN ASPART 100 UNIT/ML IJ SOLN
5.0000 [IU] | Freq: Three times a day (TID) | INTRAMUSCULAR | Status: DC
  Administered 2023-03-15 – 2023-03-16 (×4): 5 [IU] via SUBCUTANEOUS

## 2023-03-15 MED ORDER — IPRATROPIUM-ALBUTEROL 0.5-2.5 (3) MG/3ML IN SOLN
3.0000 mL | Freq: Two times a day (BID) | RESPIRATORY_TRACT | Status: DC
  Administered 2023-03-15 – 2023-03-16 (×4): 3 mL via RESPIRATORY_TRACT
  Filled 2023-03-15 (×5): qty 3

## 2023-03-15 MED ORDER — INSULIN ASPART 100 UNIT/ML IJ SOLN: 5.0000 [IU] | INTRAMUSCULAR | Status: DC

## 2023-03-15 MED ORDER — INSULIN ASPART 100 UNIT/ML IJ SOLN: 0.0000 [IU] | Freq: Every day | INTRAMUSCULAR | Status: DC

## 2023-03-15 MED ORDER — INSULIN ASPART 100 UNIT/ML IJ SOLN
10.0000 [IU] | INTRAMUSCULAR | Status: AC
  Administered 2023-03-15: 10 [IU] via SUBCUTANEOUS

## 2023-03-15 MED ORDER — INSULIN ASPART 100 UNIT/ML IJ SOLN
0.0000 [IU] | Freq: Three times a day (TID) | INTRAMUSCULAR | Status: DC
  Administered 2023-03-15: 5 [IU] via SUBCUTANEOUS
  Administered 2023-03-15 (×2): 8 [IU] via SUBCUTANEOUS
  Administered 2023-03-16: 15 [IU] via SUBCUTANEOUS
  Administered 2023-03-16: 3 [IU] via SUBCUTANEOUS
  Administered 2023-03-16: 11 [IU] via SUBCUTANEOUS
  Administered 2023-03-17: 5 [IU] via SUBCUTANEOUS
  Administered 2023-03-17: 3 [IU] via SUBCUTANEOUS
  Administered 2023-03-18: 11 [IU] via SUBCUTANEOUS
  Administered 2023-03-18 – 2023-03-19 (×2): 5 [IU] via SUBCUTANEOUS

## 2023-03-15 MED ORDER — INSULIN GLARGINE-YFGN 100 UNIT/ML ~~LOC~~ SOLN
20.0000 [IU] | Freq: Every day | SUBCUTANEOUS | Status: DC
  Administered 2023-03-15 – 2023-03-16 (×2): 20 [IU] via SUBCUTANEOUS
  Filled 2023-03-15 (×2): qty 0.2
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
## 2023-03-15 NOTE — Progress Notes (Addendum)
Patient is complete sound mind.  She is alert oriented x 4. Patient reported that she is [place] and does not want to be resuscitated and wanted to let her go peacefully if medical situation arises. I have verified with patient's at the bedside that she does not want any chest compression, patient also does not want any intubation and mechanical ventilator support if situation arises.  Otherwise she wants to continue on aggressive medical management and intervention. -I have changed patient CODE STATUS to DNR/DNI.   Patient's nurse was also at the bedside during this discussion.  Tereasa Coop, MD [facility] 03/15/2023, 1:45 AM

## 2023-03-15 NOTE — Progress Notes (Signed)
Patient's fasting blood glucose is 355 - Giving another dose of NovoLog 10 unit now.   Tereasa Coop, MD [facility] 03/15/2023, 5:37 AM

## 2023-03-15 NOTE — Progress Notes (Signed)
Spirometry introduced with teach back.

## 2023-03-15 NOTE — Progress Notes (Addendum)
  History of DM type II: Uncontrolled blood glucose in the setting of steroid: Patient's A1c 12 in 11/2022 Patient's blood glucose is persistently elevated 449>553 even after 5 minutes of NovoLog.  Giving another 10 units of NovoLog. - Continue to check POC blood glucose every 4 hours for next 8 hours and I will order insulin as needed. -Changing diet regular to heart healthy/carb modified diet.  Tereasa Coop, MD [facility] 03/15/2023, 1:54 AM
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
## 2023-03-15 NOTE — Inpatient Diabetes Management (Signed)
Inpatient Diabetes Program Recommendations  AACE/ADA: New Consensus Statement on Inpatient Glycemic Control (2015)  Target Ranges:  Prepandial:   less than 140 mg/dL      Peak postprandial:   less than 180 mg/dL (1-2 hours)      Critically ill patients:  140 - 180 mg/dL   Lab Results  Component Value Date   GLUCAP 274 (H) 03/15/2023   HGBA1C 12.0 (H) 11/30/2022    Review of Glycemic Control  Diabetes history: DM 2 Outpatient Diabetes medications: Tresiba Humalog insulins she could not keep Current orders for Inpatient glycemic control:  Semglee 20 units Novolog 0-15 units tid + hs Novolog 5 units tid meal coverage  Inpatient Diabetes Program Recommendations:    Spoke with pt at bedside regarding her elevated glucose on admission and her state of homelessness. Pt reports seeing her doctor every 6 weeks. She is living out from her car, which is broken down, in the parking lot of [employer]. She has no transportation to appointments or to a pharmacy. She at times does not eat for days. Pt reports she thinks her last A1c was around a 17%. Pt is very tearful and is asking for help. She has tried many avenues to seek assistance. At last PCP visit pt was placed on Tresiba 5 units however 5 pens were ordered at the same time and pt is unable to preserve the insulin under the current weather conditions.   Pt will need  -  Medication assistance -  Transportation assistance  Thanks,  Christena Deem RN, MSN, BC-ADM Inpatient Diabetes Coordinator Team Pager [phone] (8a-5p)

## 2023-03-15 NOTE — Progress Notes (Signed)
0046h: BG 449 at this time  0048H:  Tereasa Coop MD stated "Give it 1 hour then recheck and let me know". 0152h: BG 535 now  0153h MD stated "I will order another dose of aspart " 0531h: Pt BG is 450 . 0536h: MD stated: I" am giving patient another dose of NovoLog.".

## 2023-03-15 NOTE — Plan of Care (Signed)
  Problem: Activity: Goal: Ability to tolerate increased activity will improve Outcome: Progressing   Problem: Coping: Goal: Ability to adjust to condition or change in health will improve Outcome: Progressing   Problem: Safety: Goal: Ability to remain free from injury will improve Outcome: Progressing

## 2023-03-15 NOTE — Progress Notes (Signed)
O2 reduced to 1 L . SPO2 is 92%. Will continue to monitor

## 2023-03-16 DIAGNOSIS — J9601 Acute respiratory failure with hypoxia: Secondary | ICD-10-CM | POA: Diagnosis not present

## 2023-03-16 LAB — RENAL FUNCTION PANEL
Albumin: 2.5 g/dL — ABNORMAL LOW (ref 3.5–5.0)
Anion gap: 7 (ref 5–15)
BUN: 19 mg/dL (ref 6–20)
CO2: 29 mmol/L (ref 22–32)
Calcium: 8.8 mg/dL — ABNORMAL LOW (ref 8.9–10.3)
Chloride: 101 mmol/L (ref 98–111)
Creatinine, Ser: 0.7 mg/dL (ref 0.44–1.00)
GFR, Estimated: >60 mL/min (ref >60)
Glucose, Bld: 222 mg/dL — ABNORMAL HIGH (ref 70–99)
Phosphorus: 3 mg/dL (ref 2.5–4.6)
Potassium: 3.7 mmol/L (ref 3.5–5.1)
Sodium: 137 mmol/L (ref 135–145)

## 2023-03-16 LAB — GLUCOSE, CAPILLARY
Glucose-Capillary: 162 mg/dL — ABNORMAL HIGH (ref 70–99)
Glucose-Capillary: 200 mg/dL — ABNORMAL HIGH (ref 70–99)
Glucose-Capillary: 227 mg/dL — ABNORMAL HIGH (ref 70–99)
Glucose-Capillary: 333 mg/dL — ABNORMAL HIGH (ref 70–99)
Glucose-Capillary: 336 mg/dL — ABNORMAL HIGH (ref 70–99)
Glucose-Capillary: 401 mg/dL — ABNORMAL HIGH (ref 70–99)
Glucose-Capillary: 426 mg/dL — ABNORMAL HIGH (ref 70–99)
Glucose-Capillary: 460 mg/dL — ABNORMAL HIGH (ref 70–99)

## 2023-03-16 LAB — CBC
HCT: 43.2 % (ref 36.0–46.0)
Hemoglobin: 14.1 g/dL (ref 12.0–15.0)
MCH: 30.4 pg (ref 26.0–34.0)
MCHC: 32.6 g/dL (ref 30.0–36.0)
MCV: 93.1 fL (ref 80.0–100.0)
Platelets: 199 10*3/uL (ref 150–400)
RBC: 4.64 MIL/uL (ref 3.87–5.11)
RDW: 12.6 % (ref 11.5–15.5)
WBC: 9.4 10*3/uL (ref 4.0–10.5)
nRBC: 0 % (ref 0.0–0.2)

## 2023-03-16 LAB — RPR: RPR Ser Ql: NONREACTIVE

## 2023-03-16 LAB — MAGNESIUM: Magnesium: 1.8 mg/dL (ref 1.7–2.4)

## 2023-03-16 MED ORDER — INSULIN ASPART 100 UNIT/ML IJ SOLN
5.0000 [IU] | Freq: Once | INTRAMUSCULAR | Status: AC
  Administered 2023-03-16: 5 [IU] via SUBCUTANEOUS

## 2023-03-16 MED ORDER — INSULIN GLARGINE-YFGN 100 UNIT/ML ~~LOC~~ SOLN
24.0000 [IU] | Freq: Every day | SUBCUTANEOUS | Status: DC
  Administered 2023-03-18: 24 [IU] via SUBCUTANEOUS
  Filled 2023-03-16 (×4): qty 0.24

## 2023-03-16 MED ORDER — BUDESONIDE 0.25 MG/2ML IN SUSP
0.2500 mg | Freq: Two times a day (BID) | RESPIRATORY_TRACT | Status: DC
  Administered 2023-03-16 – 2023-03-22 (×9): 0.25 mg via RESPIRATORY_TRACT
  Filled 2023-03-16 (×12): qty 2

## 2023-03-16 MED ORDER — INSULIN GLARGINE-YFGN 100 UNIT/ML ~~LOC~~ SOLN: 24.0000 [IU] | Freq: Every day | SUBCUTANEOUS | Status: DC

## 2023-03-16 MED ORDER — INSULIN ASPART 100 UNIT/ML IJ SOLN: 8.0000 [IU] | Freq: Once | INTRAMUSCULAR | Status: DC

## 2023-03-16 MED ORDER — INSULIN GLARGINE-YFGN 100 UNIT/ML ~~LOC~~ SOLN
4.0000 [IU] | Freq: Once | SUBCUTANEOUS | Status: AC
  Administered 2023-03-16: 4 [IU] via SUBCUTANEOUS
  Filled 2023-03-16: qty 0.04

## 2023-03-16 MED ORDER — INSULIN ASPART 100 UNIT/ML IJ SOLN
8.0000 [IU] | Freq: Three times a day (TID) | INTRAMUSCULAR | Status: DC
  Administered 2023-03-16 – 2023-03-19 (×7): 8 [IU] via SUBCUTANEOUS
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
## 2023-03-16 NOTE — Progress Notes (Signed)
History of DM type II Uncontrolled DM (insulin noncompliance at home)  Persistently hyperglycemic as patient getting treatment with steroid for COPD exacerbation while in the hospital -Patient's A1c 12 checked in October 2024.  At home the compliant with insulin regimen and not on any oral antidiabetic agent. -In the hospital initiated similarly 24 units, I am changing it to give at bedtime instead of in the morning. - Currently on scheduled  aspart 8 units 3 times daily with meals. Also high sliding sliding scale insulin and at bedtime insulin as needed on board.  Blood glucose is elevated with slight improvement from 426 to 333 with NovoLog 8 units and sliding scale NovoLog 15 unit.  Rechecking next blood glucose level.  Blood glucose high will give the at bedtime insulin.  Bedside nurses hour.  Tereasa Coop, MD [facility] 03/16/2023, 8:16 PM

## 2023-03-16 NOTE — Progress Notes (Signed)
Critical results :  CBG was 401 at 1723 PM. Paged the provider.  15 max sliding scale plus 8 scheduled plus 5 additional insulin per provider  Total 28 units given.  Will continue to monitor.

## 2023-03-16 NOTE — Progress Notes (Signed)
Critical results :  CBG rechecked at 1902 PM and it was 460  2nd check at 1703 and was 426  Paged the night shift on call provider Dr. Carlynn Purl  and messaged the night shift RN's number to call back.
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
## 2023-03-16 NOTE — Progress Notes (Signed)
Patient was complaining  "I feel hot" and NT checked with temperature and it was 97.8 F. NT made the room more cooler. RN at bedside and Patient started to cry and told that she wants her friend to see her. RN assessed for pain and anything help with her. She refused.  Will continue to monitor

## 2023-03-17 DIAGNOSIS — J9601 Acute respiratory failure with hypoxia: Secondary | ICD-10-CM | POA: Diagnosis not present

## 2023-03-17 LAB — COMPREHENSIVE METABOLIC PANEL
ALT: 11 U/L (ref 0–44)
AST: 10 U/L — ABNORMAL LOW (ref 15–41)
Albumin: 2.5 g/dL — ABNORMAL LOW (ref 3.5–5.0)
Alkaline Phosphatase: 54 U/L (ref 38–126)
Anion gap: 9 (ref 5–15)
BUN: 17 mg/dL (ref 6–20)
CO2: 31 mmol/L (ref 22–32)
Calcium: 9 mg/dL (ref 8.9–10.3)
Chloride: 97 mmol/L — ABNORMAL LOW (ref 98–111)
Creatinine, Ser: 0.68 mg/dL (ref 0.44–1.00)
GFR, Estimated: >60 mL/min (ref >60)
Glucose, Bld: 190 mg/dL — ABNORMAL HIGH (ref 70–99)
Potassium: 4.4 mmol/L (ref 3.5–5.1)
Sodium: 137 mmol/L (ref 135–145)
Total Bilirubin: 0.6 mg/dL (ref 0.3–1.2)
Total Protein: 4.9 g/dL — ABNORMAL LOW (ref 6.5–8.1)

## 2023-03-17 LAB — CBC
HCT: 44.2 % (ref 36.0–46.0)
Hemoglobin: 14.6 g/dL (ref 12.0–15.0)
MCH: 31.7 pg (ref 26.0–34.0)
MCHC: 33 g/dL (ref 30.0–36.0)
MCV: 96.1 fL (ref 80.0–100.0)
Platelets: 203 10*3/uL (ref 150–400)
RBC: 4.6 MIL/uL (ref 3.87–5.11)
RDW: 12.9 % (ref 11.5–15.5)
WBC: 8.1 10*3/uL (ref 4.0–10.5)
nRBC: 0 % (ref 0.0–0.2)

## 2023-03-17 LAB — GLUCOSE, CAPILLARY
Glucose-Capillary: 204 mg/dL — ABNORMAL HIGH (ref 70–99)
Glucose-Capillary: 211 mg/dL — ABNORMAL HIGH (ref 70–99)
Glucose-Capillary: 168 mg/dL — ABNORMAL HIGH (ref 70–99)
Glucose-Capillary: 431 mg/dL — ABNORMAL HIGH (ref 70–99)
Glucose-Capillary: 403 mg/dL — ABNORMAL HIGH (ref 70–99)
Glucose-Capillary: 287 mg/dL — ABNORMAL HIGH (ref 70–99)
Glucose-Capillary: 116 mg/dL — ABNORMAL HIGH (ref 70–99)
Glucose-Capillary: 155 mg/dL — ABNORMAL HIGH (ref 70–99)

## 2023-03-17 LAB — MAGNESIUM: Magnesium: 1.9 mg/dL (ref 1.7–2.4)

## 2023-03-17 MED ORDER — INSULIN ASPART 100 UNIT/ML IJ SOLN
15.0000 [IU] | Freq: Once | INTRAMUSCULAR | Status: AC
  Administered 2023-03-17: 15 [IU] via SUBCUTANEOUS

## 2023-03-17 MED ORDER — IPRATROPIUM-ALBUTEROL 0.5-2.5 (3) MG/3ML IN SOLN
3.0000 mL | Freq: Three times a day (TID) | RESPIRATORY_TRACT | Status: DC
  Administered 2023-03-18 – 2023-03-21 (×4): 3 mL via RESPIRATORY_TRACT
  Filled 2023-03-17 (×8): qty 3

## 2023-03-17 MED ORDER — ENSURE ENLIVE PO LIQD
237.0000 mL | Freq: Three times a day (TID) | ORAL | Status: DC
  Administered 2023-03-17 – 2023-03-19 (×4): 237 mL via ORAL

## 2023-03-17 MED ORDER — INSULIN GLARGINE-YFGN 100 UNIT/ML ~~LOC~~ SOLN
12.0000 [IU] | Freq: Once | SUBCUTANEOUS | Status: AC
  Administered 2023-03-17: 12 [IU] via SUBCUTANEOUS
  Filled 2023-03-17: qty 0.12

## 2023-03-17 MED ORDER — OXYCODONE HCL 5 MG PO TABS
5.0000 mg | ORAL_TABLET | Freq: Four times a day (QID) | ORAL | Status: DC | PRN
  Filled 2023-03-17: qty 1

## 2023-03-17 MED ORDER — IPRATROPIUM-ALBUTEROL 0.5-2.5 (3) MG/3ML IN SOLN
3.0000 mL | Freq: Four times a day (QID) | RESPIRATORY_TRACT | Status: DC
  Administered 2023-03-17 (×2): 3 mL via RESPIRATORY_TRACT
  Filled 2023-03-17 (×2): qty 3

## 2023-03-17 MED ORDER — AMLODIPINE BESYLATE 5 MG PO TABS
5.0000 mg | ORAL_TABLET | Freq: Every day | ORAL | Status: DC
  Administered 2023-03-17 – 2023-03-19 (×3): 5 mg via ORAL
  Filled 2023-03-17 (×3): qty 1

## 2023-03-17 MED ORDER — GUAIFENESIN ER 600 MG PO TB12
600.0000 mg | ORAL_TABLET | Freq: Two times a day (BID) | ORAL | Status: DC
  Administered 2023-03-17 – 2023-03-22 (×10): 600 mg via ORAL
  Filled 2023-03-17 (×10): qty 1

## 2023-03-17 NOTE — Progress Notes (Signed)
Entered pt's room, pt not wearing her oxygen. O2 sats 93% RA. Pt refused for a walking test again. Educated pt on importance for test. Pt continues to cough and c/o pain. Notified Dr. Renford Dills.

## 2023-03-17 NOTE — Progress Notes (Signed)
Initial Nutrition Assessment  DOCUMENTATION CODES:   Severe malnutrition in context of social or environmental circumstances, Underweight  INTERVENTION:  Ensure TID   NUTRITION DIAGNOSIS:   Severe Malnutrition related to social / environmental circumstances (Unhoused lives in car) as evidenced by severe fat depletion, severe muscle depletion, energy intake < or equal to 50% for > or equal to 1 month.    GOAL:   Patient will meet greater than or equal to 90% of their needs    MONITOR:   PO intake, Supplement acceptance, Weight trends  REASON FOR ASSESSMENT:   Malnutrition Screening Tool    ASSESSMENT: 57 y.o. f, admitted with acute respiratory failure. Pmhx; COPD, DM, HTN, bipolar, paranoid disorder, fibromyalgia.  Pt reports living in car for over a year. Patient stated that she was over 300# when pregnant with daughter. Then decided unhealthy to be so heavy and lost weight down to around 150#,  Maintained this weight up until 2020. Further stated that she has a hard time meeting the cost for her meds.  Discussed food options do to current situation. Offered Ensure and she was agreeable to these.  Admit weight: 44 kg Current weight: 44 kg Weight history;   03/14/23 44 kg  03/02/23 44 kg  12/29/22 44 kg  11/30/22 47.8 kg    Average Meal Intake: 75-100: 96% intake x 6 recorded meals  Nutritionally Relevant Medications: Scheduled Meds:  amLODipine  5 mg Oral Daily   insulin aspart  0-15 Units Subcutaneous TID WC   insulin aspart  0-5 Units Subcutaneous QHS   insulin aspart  8 Units Subcutaneous TID WC   insulin glargine-yfgn  24 Units Subcutaneous QHS   losartan  50 mg Oral Daily    Labs Reviewed: CBG ranges from 222-190 mg/dL over the last 24 hours HgbA1c 10.7    NUTRITION - FOCUSED PHYSICAL EXAM:  Flowsheet Row Most Recent Value  Orbital Region Moderate depletion  Upper Arm Region Severe depletion  Thoracic and Lumbar Region Severe depletion  Buccal  Region Moderate depletion  Temple Region Moderate depletion  Clavicle Bone Region Severe depletion  Clavicle and Acromion Bone Region Severe depletion  Scapular Bone Region Severe depletion  Dorsal Hand Severe depletion  Patellar Region Severe depletion  Anterior Thigh Region Severe depletion  Posterior Calf Region Severe depletion  Edema (RD Assessment) None  Hair Reviewed  Eyes Reviewed  Mouth Reviewed  Skin Reviewed  Nails Reviewed       Diet Order:   Diet Order             Diet heart healthy/carb modified Room service appropriate? Yes; Fluid consistency: Thin  Diet effective now                   EDUCATION NEEDS:   No education needs have been identified at this time  Skin:  Skin Assessment: Reviewed RN Assessment  Last BM:  PTA  Height:   Ht Readings from Last 1 Encounters:  03/14/23 5\' 3"  (1.6 m)    Weight:   Wt Readings from Last 1 Encounters:  03/14/23 44 kg    Ideal Body Weight:     BMI:  Body mass index is 17.18 kg/m.  Estimated Nutritional Needs:   Kcal:  1600-1850 kcal  Protein:  60-70 g  Fluid:  17ml/kcal    Jamelle Haring RDN, LDN Clinical Dietitian  RDN pager # available on Amion
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
## 2023-03-17 NOTE — Progress Notes (Addendum)
Notified Dr. Renford Dills of pt's CBG 431 at 1634. New order received.  Notified Dr. Renford Dills of pt's CBG 403 1 hour after insulin given. New order received. Notified Dr. Renford Dills at [phone] of pt's CBG 287 after 1 hour of extra insulin given. No new orders received.

## 2023-03-17 NOTE — Progress Notes (Addendum)
Entered pt's room and pt not wearing her oxygen. Sats 85% RA. Placed pt on 2L [place], increased to 92%. MD in room. Pt refused to perform a walking test.

## 2023-03-17 NOTE — Progress Notes (Addendum)
Pt ambulated from bathroom after giving self a sponge bath, O2 sats 86% RA. Placed pt on 2L [place], O2 sat 95%.

## 2023-03-17 NOTE — Plan of Care (Signed)
  Problem: Activity: Goal: Ability to tolerate increased activity will improve Outcome: Progressing   Problem: Cardiac: Goal: Ability to achieve and maintain adequate cardiovascular perfusion will improve Outcome: Progressing   Problem: Education: Goal: Knowledge of disease or condition will improve Outcome: Progressing   Problem: Activity: Goal: Ability to tolerate increased activity will improve Outcome: Progressing   Problem: Respiratory: Goal: Levels of oxygenation will improve Outcome: Progressing

## 2023-03-17 NOTE — Inpatient Diabetes Management (Signed)
Inpatient Diabetes Program Recommendations  AACE/ADA: New Consensus Statement on Inpatient Glycemic Control (2015)  Target Ranges:  Prepandial:   less than 140 mg/dL      Peak postprandial:   less than 180 mg/dL (1-2 hours)      Critically ill patients:  140 - 180 mg/dL   Lab Results  Component Value Date   GLUCAP 168 (H) 03/17/2023   HGBA1C 12.0 (H) 11/30/2022    Discharge recommendations  Pt currently on steroids effecting insulin needs. If pt is not going to be on steroids long would recommend d/c ing pt on:  -  Tresiba 5 units -  Humalog 8 units tid -  insulin pen needles order # [id number]  Discussed case at length with SW and CM.  Thanks,  Christena Deem RN, MSN, BC-ADM Inpatient Diabetes Coordinator Team Pager [phone] (8a-5p)

## 2023-03-18 DIAGNOSIS — J9601 Acute respiratory failure with hypoxia: Secondary | ICD-10-CM | POA: Diagnosis not present

## 2023-03-18 LAB — GLUCOSE, CAPILLARY
Glucose-Capillary: 298 mg/dL — ABNORMAL HIGH (ref 70–99)
Glucose-Capillary: 235 mg/dL — ABNORMAL HIGH (ref 70–99)
Glucose-Capillary: 76 mg/dL (ref 70–99)
Glucose-Capillary: 321 mg/dL — ABNORMAL HIGH (ref 70–99)
Glucose-Capillary: 85 mg/dL (ref 70–99)

## 2023-03-18 MED ORDER — TUBERCULIN PPD 5 UNIT/0.1ML ID SOLN
5.0000 [IU] | Freq: Once | INTRADERMAL | Status: AC
  Administered 2023-03-18: 5 [IU] via INTRADERMAL
  Filled 2023-03-18: qty 0.1

## 2023-03-18 NOTE — TOC Progression Note (Signed)
Transition of Care [facility]) - Progression Note    Patient Details  Name: Courtney Bennett MRN: [id number] Date of Birth: [birth year]  [facility]) CM/SW Contact  Janae Bridgeman, RN Phone Number: 03/18/2023, 10:54 AM  Clinical Narrative:    CM spoke with the patient in depth at the bedside regarding ALF placement since patient is chronically homeless and unable to treat her diabetes in the community without availability to refrigerator in the community.  Patient has been living in her car to the past year and refuses to go to hotel nor homeless shelter.  FL2 completed and sent to [place], CM at [facility] ALF and she offered at bed to the patient and will come and meet with the patient at the bedside to discuss financials.  TB skin test requested.  Will go and speak with the patient to update that [facility] will come and speak with the patient to discuss.  03/18/23 1110- - Patient updated that [facility] will come and meet with her and that a TB skin test will be needed. Patient was informed that she would be unable to park her car on the property.  CM will continue to follow the patient for ALF placement. Expected Discharge Plan: Assisted Living Barriers to Discharge: Continued Medical Work up  Expected Discharge Plan and Services   Discharge Planning Services: CM Consult Post Acute Care Choice: Resumption of Svcs/PTA Provider Living arrangements for the past 2 months: Homeless (Patient lives in her car and refuses shelter resources)                                       Social Determinants of Health (SDOH) Interventions SDOH Screenings   Food Insecurity: Food Insecurity Present (03/15/2023)  Housing: Patient Declined (03/15/2023)  Recent Concern: Housing - High Risk (12/29/2022)  Transportation Needs: Unmet Transportation Needs (03/15/2023)  Utilities: Not At Risk (03/15/2023)  Depression (PHQ2-9): Medium Risk (06/16/2019)  Tobacco Use: Medium  Risk (03/14/2023)    Readmission Risk Interventions    03/17/2023    1:13 PM  Readmission Risk Prevention Plan  Transportation Screening Complete  PCP or Specialist Appt within 5-7 Days Complete  Home Care Screening Complete  Medication Review (RN CM) Complete
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
## 2023-03-18 NOTE — Plan of Care (Signed)
  Problem: Activity: Goal: Ability to tolerate increased activity will improve Outcome: Progressing   Problem: Education: Goal: Knowledge of disease or condition will improve Outcome: Progressing   Problem: Coping: Goal: Ability to adjust to condition or change in health will improve Outcome: Progressing   Problem: Skin Integrity: Goal: Risk for impaired skin integrity will decrease Outcome: Progressing

## 2023-03-18 NOTE — [place] FL2 (Signed)
[place] MEDICAID FL2 LEVEL OF CARE FORM     IDENTIFICATION  Patient Name: Courtney Bennett Birthdate: Jan 23, 1966 Sex: female Admission Date (Current Location): 03/14/2023  [place] and [place] Number:  Haynes Bast [id number] Facility and Address:  [facility], [address], [place], [place] [address]      Provider Number: [id number]  Attending Physician Name and Address:  Burnadette Pop, MD  Relative Name and Phone Number:       Current Level of Care: Hospital Recommended Level of Care: Assisted Living Facility Prior Approval Number:    Date Approved/Denied:   PASRR Number:    Discharge Plan: Other (Comment) (ALF)    Current Diagnoses: Patient Active Problem List   Diagnosis Date Noted   Acute respiratory failure with hypoxemia (HCC) 03/15/2023   COPD with acute exacerbation (HCC) 03/14/2023   Acute respiratory failure with hypoxia (HCC) 03/14/2023   Protein-calorie malnutrition, severe 12/31/2022   Atypical chest pain 12/29/2022   Essential hypertension 12/29/2022   DM (diabetes mellitus) (HCC) 12/01/2022   Gastric distention 11/30/2022   Hav (hallux abducto valgus), unspecified laterality 11/04/2022   COPD (chronic obstructive pulmonary disease) (HCC) 01/22/2022   Homeless 01/22/2022   Tobacco dependence 12/27/2019   Aspiration pneumonia (HCC) 12/10/2019   Paroxysmal atrial flutter (HCC) 12/10/2019   Fibromyalgia 02/27/2019   Bipolar affective disorder, current episode hypomanic (HCC) 08/10/2014   Paranoid (HCC) 08/10/2014    Orientation RESPIRATION BLADDER Height & Weight     Self, Time, Situation, Place  Normal Continent Weight: 44 kg Height:  5\' 3"  (160 cm)  BEHAVIORAL SYMPTOMS/MOOD NEUROLOGICAL BOWEL NUTRITION STATUS      Continent Diet (Carbohydrate modified, heart healthy diet)  AMBULATORY STATUS COMMUNICATION OF NEEDS Skin   Independent Verbally Normal                       Personal Care Assistance Level of  Assistance  Bathing, Feeding, Dressing Bathing Assistance: Independent Feeding assistance: Independent Dressing Assistance: Independent     Functional Limitations Info  Sight, Hearing, Speech Sight Info: Adequate Hearing Info: Adequate Speech Info: Adequate    SPECIAL CARE FACTORS FREQUENCY                       Contractures Contractures Info: Not present    Additional Factors Info  Code Status, Insulin Sliding Scale, Allergies Code Status Info: DNR Allergies Info: Nichel, Oxycodone, Metformin, elemental sufur, lamictal, latex, sulfa, tape   Insulin Sliding Scale Info: Novolog Insulin - moderate scale 3 x per week and Q HS, Semglee Q HS       Current Medications (03/18/2023):  This is the current hospital active medication list Current Facility-Administered Medications  Medication Dose Route Frequency Provider Last Rate Last Admin   acetaminophen (TYLENOL) tablet 650 mg  650 mg Oral Q6H PRN Synetta Fail, MD   650 mg at 03/17/23 2348   Or   acetaminophen (TYLENOL) suppository 650 mg  650 mg Rectal Q6H PRN Synetta Fail, MD       amLODipine (NORVASC) tablet 5 mg  5 mg Oral Daily Burnadette Pop, MD   5 mg at 03/18/23 0828   budesonide (PULMICORT) nebulizer solution 0.25 mg  0.25 mg Nebulization BID Leatha Gilding, MD   0.25 mg at 03/18/23 0738   enoxaparin (LOVENOX) injection 30 mg  30 mg Subcutaneous Q24H Synetta Fail, MD   30 mg at 03/17/23 2003   feeding supplement (  ENSURE ENLIVE / ENSURE PLUS) liquid 237 mL  237 mL Oral TID BM Adhikari, Amrit, MD   237 mL at 03/17/23 2234   guaiFENesin (MUCINEX) 12 hr tablet 600 mg  600 mg Oral BID Burnadette Pop, MD   600 mg at 03/18/23 0828   insulin aspart (novoLOG) injection 0-15 Units  0-15 Units Subcutaneous TID WC Sundil, Subrina, MD   5 Units at 03/18/23 0829   insulin aspart (novoLOG) injection 0-5 Units  0-5 Units Subcutaneous QHS Synetta Fail, MD   2 Units at 03/16/23 2238   insulin aspart  (novoLOG) injection 8 Units  8 Units Subcutaneous TID WC Leatha Gilding, MD   8 Units at 03/18/23 0828   insulin glargine-yfgn (SEMGLEE) injection 24 Units  24 Units Subcutaneous QHS Sundil, Subrina, MD       ipratropium-albuterol (DUONEB) 0.5-2.5 (3) MG/3ML nebulizer solution 3 mL  3 mL Nebulization TID Burnadette Pop, MD   3 mL at 03/18/23 0738   losartan (COZAAR) tablet 50 mg  50 mg Oral Daily Synetta Fail, MD   50 mg at 03/18/23 1610   oxyCODONE (Oxy IR/ROXICODONE) immediate release tablet 5 mg  5 mg Oral Q6H PRN Burnadette Pop, MD       pantoprazole (PROTONIX) EC tablet 40 mg  40 mg Oral BID Synetta Fail, MD   40 mg at 03/18/23 0828   polyethylene glycol (MIRALAX / GLYCOLAX) packet 17 g  17 g Oral Daily PRN Synetta Fail, MD       sodium chloride flush (NS) 0.9 % injection 3 mL  3 mL Intravenous Q12H Synetta Fail, MD   3 mL at 03/18/23 1035     Discharge Medications: Please see discharge summary for a list of discharge medications.  Relevant Imaging Results:  Relevant Lab Results:   Additional Information SS# [id number]  Janae Bridgeman, RN

## 2023-03-18 NOTE — Plan of Care (Signed)
  Problem: Activity: Goal: Ability to tolerate increased activity will improve Outcome: Progressing   Problem: Cardiac: Goal: Ability to achieve and maintain adequate cardiovascular perfusion will improve Outcome: Progressing   Problem: Health Behavior/Discharge Planning: Goal: Ability to safely manage health-related needs after discharge will improve Outcome: Progressing   Problem: Education: Goal: Knowledge of disease or condition will improve Outcome: Progressing   Problem: Activity: Goal: Ability to tolerate increased activity will improve Outcome: Progressing   Problem: Respiratory: Goal: Ability to maintain a clear airway will improve Outcome: Progressing

## 2023-03-18 NOTE — Plan of Care (Signed)
  Problem: Education: Goal: Understanding of cardiac disease, CV risk reduction, and recovery process will improve Outcome: Progressing Goal: Individualized Educational Video(s) Outcome: Progressing   Problem: Activity: Goal: Ability to tolerate increased activity will improve Outcome: Progressing   Problem: Cardiac: Goal: Ability to achieve and maintain adequate cardiovascular perfusion will improve Outcome: Progressing   Problem: Health Behavior/Discharge Planning: Goal: Ability to safely manage health-related needs after discharge will improve Outcome: Progressing   Problem: Education: Goal: Knowledge of disease or condition will improve Outcome: Progressing Goal: Knowledge of the prescribed therapeutic regimen will improve Outcome: Progressing Goal: Individualized Educational Video(s) Outcome: Progressing   Problem: Activity: Goal: Ability to tolerate increased activity will improve Outcome: Progressing Goal: Will verbalize the importance of balancing activity with adequate rest periods Outcome: Progressing   Problem: Respiratory: Goal: Ability to maintain a clear airway will improve Outcome: Progressing Goal: Levels of oxygenation will improve Outcome: Progressing Goal: Ability to maintain adequate ventilation will improve Outcome: Progressing   Problem: Education: Goal: Ability to describe self-care measures that may prevent or decrease complications (Diabetes Survival Skills Education) will improve Outcome: Progressing Goal: Individualized Educational Video(s) Outcome: Progressing   Problem: Coping: Goal: Ability to adjust to condition or change in health will improve Outcome: Progressing   Problem: Fluid Volume: Goal: Ability to maintain a balanced intake and output will improve Outcome: Progressing   Problem: Health Behavior/Discharge Planning: Goal: Ability to identify and utilize available resources and services will improve Outcome:  Progressing Goal: Ability to manage health-related needs will improve Outcome: Progressing   Problem: Metabolic: Goal: Ability to maintain appropriate glucose levels will improve Outcome: Progressing   Problem: Nutritional: Goal: Maintenance of adequate nutrition will improve Outcome: Progressing Goal: Progress toward achieving an optimal weight will improve Outcome: Progressing   Problem: Skin Integrity: Goal: Risk for impaired skin integrity will decrease Outcome: Progressing   Problem: Tissue Perfusion: Goal: Adequacy of tissue perfusion will improve Outcome: Progressing   Problem: Education: Goal: Knowledge of General Education information will improve Description: Including pain rating scale, medication(s)/side effects and non-pharmacologic comfort measures Outcome: Progressing   Problem: Health Behavior/Discharge Planning: Goal: Ability to manage health-related needs will improve Outcome: Progressing   Problem: Clinical Measurements: Goal: Ability to maintain clinical measurements within normal limits will improve Outcome: Progressing Goal: Will remain free from infection Outcome: Progressing Goal: Diagnostic test results will improve Outcome: Progressing Goal: Respiratory complications will improve Outcome: Progressing Goal: Cardiovascular complication will be avoided Outcome: Progressing   Problem: Activity: Goal: Risk for activity intolerance will decrease Outcome: Progressing   Problem: Nutrition: Goal: Adequate nutrition will be maintained Outcome: Progressing   Problem: Coping: Goal: Level of anxiety will decrease Outcome: Progressing   Problem: Elimination: Goal: Will not experience complications related to bowel motility Outcome: Progressing Goal: Will not experience complications related to urinary retention Outcome: Progressing   Problem: Pain Managment: Goal: General experience of comfort will improve Outcome: Progressing   Problem:  Safety: Goal: Ability to remain free from injury will improve Outcome: Progressing   Problem: Skin Integrity: Goal: Risk for impaired skin integrity will decrease Outcome: Progressing   Problem: Education: Goal: Understanding of post-operative needs will improve Outcome: Progressing Goal: Individualized Educational Video(s) Outcome: Progressing   Problem: Clinical Measurements: Goal: Postoperative complications will be avoided or minimized Outcome: Progressing   Problem: Respiratory: Goal: Will regain and/or maintain adequate ventilation Outcome: Progressing

## 2023-03-19 ENCOUNTER — Other Ambulatory Visit (HOSPITAL_COMMUNITY): Payer: Self-pay

## 2023-03-19 DIAGNOSIS — J9601 Acute respiratory failure with hypoxia: Secondary | ICD-10-CM | POA: Diagnosis not present

## 2023-03-19 LAB — GLUCOSE, CAPILLARY
Glucose-Capillary: 100 mg/dL — ABNORMAL HIGH (ref 70–99)
Glucose-Capillary: 167 mg/dL — ABNORMAL HIGH (ref 70–99)
Glucose-Capillary: 209 mg/dL — ABNORMAL HIGH (ref 70–99)
Glucose-Capillary: 282 mg/dL — ABNORMAL HIGH (ref 70–99)
Glucose-Capillary: 342 mg/dL — ABNORMAL HIGH (ref 70–99)
Glucose-Capillary: 53 mg/dL — ABNORMAL LOW (ref 70–99)

## 2023-03-19 LAB — CULTURE, BLOOD (ROUTINE X 2)
Culture: NO GROWTH
Culture: NO GROWTH

## 2023-03-19 MED ORDER — INSULIN DEGLUDEC 100 UNIT/ML ~~LOC~~ SOPN: 5.0000 [IU] | PEN_INJECTOR | SUBCUTANEOUS | Status: DC

## 2023-03-19 MED ORDER — INSULIN ASPART 100 UNIT/ML IJ SOLN
0.0000 [IU] | Freq: Three times a day (TID) | INTRAMUSCULAR | Status: DC
  Administered 2023-03-19: 7 [IU] via SUBCUTANEOUS
  Administered 2023-03-20: 5 [IU] via SUBCUTANEOUS
  Administered 2023-03-20: 7 [IU] via SUBCUTANEOUS
  Administered 2023-03-21 – 2023-03-22 (×2): 3 [IU] via SUBCUTANEOUS

## 2023-03-19 MED ORDER — INSULIN ASPART 100 UNIT/ML IJ SOLN
0.0000 [IU] | Freq: Every day | INTRAMUSCULAR | Status: DC
  Administered 2023-03-19: 3 [IU] via SUBCUTANEOUS
  Administered 2023-03-21: 4 [IU] via SUBCUTANEOUS

## 2023-03-19 MED ORDER — INSULIN ASPART 100 UNIT/ML IJ SOLN
5.0000 [IU] | Freq: Three times a day (TID) | INTRAMUSCULAR | Status: DC
  Administered 2023-03-19: 5 [IU] via SUBCUTANEOUS

## 2023-03-19 MED ORDER — INSULIN GLARGINE-YFGN 100 UNIT/ML ~~LOC~~ SOLN
10.0000 [IU] | Freq: Every day | SUBCUTANEOUS | Status: DC
  Filled 2023-03-19: qty 0.1

## 2023-03-19 MED ORDER — INSULIN GLARGINE-YFGN 100 UNIT/ML ~~LOC~~ SOLN
5.0000 [IU] | Freq: Every day | SUBCUTANEOUS | Status: DC
  Filled 2023-03-19: qty 0.05
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
## 2023-03-19 NOTE — TOC Benefit Eligibility Note (Addendum)
Patient Product/process development scientist completed.    The patient is insured through [facility]. Patient has Medicare and is not eligible for a copay card, but may be able to apply for patient assistance, if available.    Ran test claim for Lantus Pen and the current 30 day co-pay is $0.00.  Ran test claim for Tresiba FlexTouch 100 unit/ml and the current 30 day co-pay is $0.00.  This test claim was processed through [facility]- copay amounts may vary at other pharmacies due to pharmacy/plan contracts, or as the patient moves through the different stages of their insurance plan.     Roland Earl, CPHT Pharmacy Technician III Certified Patient Advocate [facility] Pharmacy Patient Advocate Team Direct Number: [phone]  Fax: [phone]

## 2023-03-19 NOTE — TOC Progression Note (Addendum)
Transition of Care [facility]) - Progression Note    Patient Details  Name: Courtney Bennett MRN: [id number] Date of Birth: [birth year]  Transition [facility] CM/SW Contact  Janae Bridgeman, RN Phone Number: 03/19/2023, 11:20 AM  Clinical Narrative:    CM spoke with the patient's primary RN and the patient was wanting to leave the unit to visit the cafeteria/gift shop and was upset that she was unable to leave the unit.  I attempted to speak with the patient at the bedside to discuss plans to ALF placement pending for next week and the patient states that she can't talk right now and asked that I come back since her"blood sugar is dropping".  03/19/2023 1400 - I met with the patient at the bedside and she is agreeable and aware that [facility] has offered her a bed with possible placement next week.  Patient requests a Cane.  I ordered and asked MD to co-sign.  Bedside nursing was asked to please read the TB skin test tomorrow and document results in a progress note.  CM will continue to follow the patient for ALF placement - pending at [facility] when bed available next week.   Expected Discharge Plan: Assisted Living Barriers to Discharge: Continued Medical Work up  Expected Discharge Plan and Services   Discharge Planning Services: CM Consult Post Acute Care Choice: Resumption of Svcs/PTA Provider Living arrangements for the past 2 months: Homeless (Patient lives in her car and refuses shelter resources)                                       Social Determinants of Health (SDOH) Interventions SDOH Screenings   Food Insecurity: Food Insecurity Present (03/15/2023)  Housing: Patient Declined (03/15/2023)  Recent Concern: Housing - High Risk (12/29/2022)  Transportation Needs: Unmet Transportation Needs (03/15/2023)  Utilities: Not At Risk (03/15/2023)  Depression (PHQ2-9): Medium Risk (06/16/2019)  Tobacco Use: Medium Risk (03/14/2023)    Readmission  Risk Interventions    03/17/2023    1:13 PM  Readmission Risk Prevention Plan  Transportation Screening Complete  PCP or Specialist Appt within 5-7 Days Complete  Home Care Screening Complete  Medication Review (RN CM) Complete

## 2023-03-19 NOTE — Plan of Care (Signed)
[facility] Tonsillectomy/Adenoidectomy, Postoperative PEDS care plan entered in error.

## 2023-03-19 NOTE — Inpatient Diabetes Management (Signed)
Inpatient Diabetes Program Recommendations  AACE/ADA: New Consensus Statement on Inpatient Glycemic Control (2015)  Target Ranges:  Prepandial:   less than 140 mg/dL      Peak postprandial:   less than 180 mg/dL (1-2 hours)      Critically ill patients:  140 - 180 mg/dL   Lab Results  Component Value Date   GLUCAP 53 (L) 03/19/2023   HGBA1C 12.0 (H) 11/30/2022    Review of Glycemic Control  Latest Reference Range & Units 03/18/23 11:17 03/18/23 16:35 03/18/23 21:11 03/19/23 00:53  Glucose-Capillary 70 - 99 mg/dL 76 161 (H) 85 096 (H)    Latest Reference Range & Units 03/19/23 08:12 03/19/23 11:19  Glucose-Capillary 70 - 99 mg/dL 045 (H) 53 (L)   Diabetes history: DM  Outpatient Diabetes medications:  Tresiba Humalog insulins she could not keep  Current orders for Inpatient glycemic control:  Novolog 0-15 units tid with meals and HS Novolog 8 units tid with meals Semglee 10 units q HS Inpatient Diabetes Program Recommendations:    Consider reducing Novolog meal coverage to 6 units tid with meals and change Semglee to 20 units daily. Also consider reducing Novolog correction to sensitive tid with meals. Looks like she needs less insulin since steroids stopped (but she seems to need the higher dose of basal insulin).  Note potential plans for ALF for patient.  Will follow.   Thanks,  Beryl Meager, RN, BC-ADM Inpatient Diabetes Coordinator Pager [phone]  (8a-5p)

## 2023-03-20 DIAGNOSIS — J9601 Acute respiratory failure with hypoxia: Secondary | ICD-10-CM | POA: Diagnosis not present

## 2023-03-20 LAB — GLUCOSE, CAPILLARY
Glucose-Capillary: 300 mg/dL — ABNORMAL HIGH (ref 70–99)
Glucose-Capillary: 287 mg/dL — ABNORMAL HIGH (ref 70–99)
Glucose-Capillary: 318 mg/dL — ABNORMAL HIGH (ref 70–99)
Glucose-Capillary: 162 mg/dL — ABNORMAL HIGH (ref 70–99)

## 2023-03-20 MED ORDER — INSULIN GLARGINE-YFGN 100 UNIT/ML ~~LOC~~ SOLN
10.0000 [IU] | Freq: Every day | SUBCUTANEOUS | Status: DC
  Administered 2023-03-20 – 2023-03-21 (×2): 10 [IU] via SUBCUTANEOUS
  Filled 2023-03-20 (×2): qty 0.1

## 2023-03-20 MED ORDER — INSULIN ASPART 100 UNIT/ML IJ SOLN
6.0000 [IU] | Freq: Three times a day (TID) | INTRAMUSCULAR | Status: DC
  Administered 2023-03-20 – 2023-03-22 (×3): 6 [IU] via SUBCUTANEOUS

## 2023-03-20 MED ORDER — AMLODIPINE BESYLATE 10 MG PO TABS
10.0000 mg | ORAL_TABLET | Freq: Every day | ORAL | Status: DC
  Administered 2023-03-20 – 2023-03-22 (×3): 10 mg via ORAL
  Filled 2023-03-20 (×3): qty 1
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
## 2023-03-20 NOTE — Plan of Care (Signed)
  Problem: Education: Goal: Understanding of cardiac disease, CV risk reduction, and recovery process will improve Outcome: Progressing   Problem: Activity: Goal: Ability to tolerate increased activity will improve Outcome: Progressing   Problem: Cardiac: Goal: Ability to achieve and maintain adequate cardiovascular perfusion will improve Outcome: Progressing   Problem: Activity: Goal: Ability to tolerate increased activity will improve Outcome: Progressing

## 2023-03-20 NOTE — Progress Notes (Signed)
TB read at this time. Negative TB result.

## 2023-03-20 NOTE — Progress Notes (Signed)
RN informed BG was 300. RN went to give see if patient was eating breakfast and to give the insulin per MAR. Patient seemed irritated and stated that she was not eating breakfast and didn't want the insulin. MD notified. RN also offered other am medications to which patient ignored RN's question. RN left room. Call light left within reach. Will round again for medications at a later time.

## 2023-03-21 DIAGNOSIS — J9601 Acute respiratory failure with hypoxia: Secondary | ICD-10-CM | POA: Diagnosis not present

## 2023-03-21 LAB — GLUCOSE, CAPILLARY
Glucose-Capillary: 239 mg/dL — ABNORMAL HIGH (ref 70–99)
Glucose-Capillary: 208 mg/dL — ABNORMAL HIGH (ref 70–99)
Glucose-Capillary: 111 mg/dL — ABNORMAL HIGH (ref 70–99)
Glucose-Capillary: 319 mg/dL — ABNORMAL HIGH (ref 70–99)

## 2023-03-21 LAB — CREATININE, SERUM
Creatinine, Ser: 0.48 mg/dL (ref 0.44–1.00)
GFR, Estimated: >60 mL/min (ref >60)

## 2023-03-21 MED ORDER — INSULIN GLARGINE-YFGN 100 UNIT/ML ~~LOC~~ SOLN
20.0000 [IU] | Freq: Every day | SUBCUTANEOUS | Status: DC
  Filled 2023-03-21: qty 0.2

## 2023-03-21 MED ORDER — IPRATROPIUM-ALBUTEROL 0.5-2.5 (3) MG/3ML IN SOLN
3.0000 mL | Freq: Two times a day (BID) | RESPIRATORY_TRACT | Status: DC
  Administered 2023-03-21 – 2023-03-22 (×2): 3 mL via RESPIRATORY_TRACT
  Filled 2023-03-21 (×2): qty 3

## 2023-03-21 NOTE — Plan of Care (Signed)
  Problem: Activity: Goal: Ability to tolerate increased activity will improve 03/21/2023 1638 by Maceo Pro, RN Outcome: Progressing 03/21/2023 1637 by Maceo Pro, RN Outcome: Progressing   Problem: Health Behavior/Discharge Planning: Goal: Ability to safely manage health-related needs after discharge will improve 03/21/2023 1638 by Maceo Pro, RN Outcome: Progressing 03/21/2023 1637 by Maceo Pro, RN Outcome: Progressing   Problem: Education: Goal: Ability to describe self-care measures that may prevent or decrease complications (Diabetes Survival Skills Education) will improve 03/21/2023 1638 by Maceo Pro, RN Outcome: Progressing 03/21/2023 1637 by Maceo Pro, RN Outcome: Progressing

## 2023-03-21 NOTE — Plan of Care (Signed)
  Problem: Activity: Goal: Ability to tolerate increased activity will improve Outcome: Progressing   Problem: Education: Goal: Knowledge of disease or condition will improve Outcome: Progressing   Problem: Activity: Goal: Ability to tolerate increased activity will improve Outcome: Progressing   Problem: Respiratory: Goal: Ability to maintain a clear airway will improve Outcome: Progressing   Problem: Coping: Goal: Ability to adjust to condition or change in health will improve Outcome: Progressing
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
## 2023-03-22 ENCOUNTER — Other Ambulatory Visit (HOSPITAL_COMMUNITY): Payer: Self-pay

## 2023-03-22 DIAGNOSIS — J9601 Acute respiratory failure with hypoxia: Secondary | ICD-10-CM | POA: Diagnosis not present

## 2023-03-22 LAB — GLUCOSE, CAPILLARY
Glucose-Capillary: 278 mg/dL — ABNORMAL HIGH (ref 70–99)
Glucose-Capillary: 89 mg/dL (ref 70–99)
Glucose-Capillary: 174 mg/dL — ABNORMAL HIGH (ref 70–99)

## 2023-03-22 MED ORDER — ALBUTEROL SULFATE HFA 108 (90 BASE) MCG/ACT IN AERS
1.0000 | INHALATION_SPRAY | Freq: Four times a day (QID) | RESPIRATORY_TRACT | 0 refills | Status: DC | PRN
  Filled 2023-03-22: qty 18, 25d supply, fill #0

## 2023-03-22 MED ORDER — INSULIN DEGLUDEC 100 UNIT/ML ~~LOC~~ SOPN
20.0000 [IU] | PEN_INJECTOR | Freq: Every day | SUBCUTANEOUS | 0 refills | Status: DC
  Filled 2023-03-22: qty 15, 75d supply, fill #0

## 2023-03-22 MED ORDER — INSULIN PEN NEEDLE 32G X 4 MM MISC
0 refills | Status: DC
  Filled 2023-03-22: qty 100, 30d supply, fill #0

## 2023-03-22 MED ORDER — AMLODIPINE BESYLATE 10 MG PO TABS
10.0000 mg | ORAL_TABLET | Freq: Every day | ORAL | 1 refills | Status: DC
  Filled 2023-03-22: qty 30, 30d supply, fill #0

## 2023-03-22 MED ORDER — GUAIFENESIN ER 600 MG PO TB12
600.0000 mg | ORAL_TABLET | Freq: Two times a day (BID) | ORAL | 0 refills | Status: AC
  Filled 2023-03-22: qty 14, 7d supply, fill #0

## 2023-03-22 MED ORDER — UMECLIDINIUM BROMIDE 62.5 MCG/ACT IN AEPB
1.0000 | INHALATION_SPRAY | Freq: Every day | RESPIRATORY_TRACT | 0 refills | Status: AC
  Filled 2023-03-22: qty 30, 30d supply, fill #0

## 2023-03-22 MED ORDER — HUMALOG KWIKPEN 200 UNIT/ML ~~LOC~~ SOPN
8.0000 [IU] | PEN_INJECTOR | Freq: Three times a day (TID) | SUBCUTANEOUS | 0 refills | Status: DC
  Filled 2023-03-22: qty 6, 24d supply, fill #0

## 2023-03-22 MED ORDER — LOSARTAN POTASSIUM 50 MG PO TABS
50.0000 mg | ORAL_TABLET | Freq: Every day | ORAL | 1 refills | Status: DC
  Filled 2023-03-22: qty 30, 30d supply, fill #0

## 2023-03-22 NOTE — Progress Notes (Addendum)
LPN informed patient she had Semglee to be given this morning. Patient states that she didn't need the insulin. Patient starts yelling at LPN stating "you dropped my fucking sugar too low and gave me too much insulin." Patient seem to be irritated. LPN explained to patient that she has a order for 6 units of novalog with her meals. But also has a order for a sliding scale of novalog. BG was 278 this am. The sliding scale called for 3 unit of novalog. This was explained to patient. Patient states well I haven't ate anything this morning. LPN explained to patient that when LPN was in the room this morning she was ordering food. Patient states well they haven't got her yet.  LPN also asked patient why she wouldn't talk with the doctor this morning. Patient ignored the LPN. Call light within reach. LPN left room.  Patients BG was retaken and is now 89. Patient received a cup of juice.
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
## 2023-03-22 NOTE — Progress Notes (Signed)
Went into patients room to inform her we needed to take the O2 off of her per MD. Made her aware MD said since she has COPD the normal level of O2 is between 88-100. Patient ignored me when talking wit her.   Patient then seen walking down the hallway with her belongings. LPN asked patient where she was going. Patient ignored LPN. LPN explained to patient that ignoring LPN is not getting her anywhere. Patient got on elevator and left with belongings.    Charge nurse and MD notified at this time.

## 2023-03-22 NOTE — TOC Progression Note (Addendum)
Transition of Care [facility]) - Progression Note    Patient Details  Name: Courtney Bennett MRN: [id number] Date of Birth: 1965/10/02  [facility] CM/SW Contact  Janae Bridgeman, RN Phone Number: 03/22/2023, 10:23 AM  Clinical Narrative:    CM spoke with the attending physician and patient is medically stable to discharge.  I spoke with the patient at the bedside and offered to her that [facility] is willing to offer the patient a bed at the facility today by 12 noon.  Discharge summary was placed by the MD.   I spoke with Hydia, CM at [facility] and I will provide the facility with copy of the discharge summary, updated FL2 and progress note regarding the TB skin test.  If patient agrees to go to the ALF today - patient will be discharged by taxi.  03/22/23 1144 - Patient was provided her Medicare notice at the bedside to appeal her discharge as she request this morning.  MD is aware.  Patient will likely discharge back to the community if she refuses discharge to the arranged ALF that she was in agreement to discharge to last week.  03/22/23 1319 - Dorena Bodo, CMA with TOC state that the patient has completed her Medicare appeal and detailed notice of discharge was completed.  I attempted to present the discharge notice to the patient but patient was not in her room and bedside nurse and charge RN states that the patient left AMA.  Hydia, CM at [facility] was notified and the patient left AMA and was not willing to wait to admission to the ALF.  03/22/23 1326 - I called and spoke with Hydia, CM at [facility] and she plans to follow up with the patient and also call Dr. Delford Field who is patient's PCP to follow up with the patient in the community to try and obtain admission for the patient to the facility from the community.   Expected Discharge Plan: Assisted Living Barriers to Discharge: Continued Medical Work up  Expected Discharge Plan and  Services   Discharge Planning Services: CM Consult Post Acute Care Choice: Resumption of Svcs/PTA Provider Living arrangements for the past 2 months: Homeless (Patient lives in her car and refuses shelter resources)                                       Social Determinants of Health (SDOH) Interventions SDOH Screenings   Food Insecurity: Food Insecurity Present (03/15/2023)  Housing: Patient Declined (03/15/2023)  Recent Concern: Housing - High Risk (12/29/2022)  Transportation Needs: Unmet Transportation Needs (03/15/2023)  Utilities: Not At Risk (03/15/2023)  Depression (PHQ2-9): Medium Risk (06/16/2019)  Tobacco Use: Medium Risk (03/14/2023)    Readmission Risk Interventions    03/17/2023    1:13 PM  Readmission Risk Prevention Plan  Transportation Screening Complete  PCP or Specialist Appt within 5-7 Days Complete  Home Care Screening Complete  Medication Review (RN CM) Complete

## 2023-03-22 NOTE — Plan of Care (Signed)
A/Ox4 and on room air. No complaints of pain this shift. Independent with walker around unit. No overnight issues. ACHS   Problem: Education: Goal: Understanding of cardiac disease, CV risk reduction, and recovery process will improve Outcome: Progressing Goal: Individualized Educational Video(s) Outcome: Progressing   Problem: Activity: Goal: Ability to tolerate increased activity will improve Outcome: Progressing   Problem: Cardiac: Goal: Ability to achieve and maintain adequate cardiovascular perfusion will improve Outcome: Progressing   Problem: Health Behavior/Discharge Planning: Goal: Ability to safely manage health-related needs after discharge will improve Outcome: Progressing   Problem: Education: Goal: Knowledge of disease or condition will improve Outcome: Progressing Goal: Knowledge of the prescribed therapeutic regimen will improve Outcome: Progressing Goal: Individualized Educational Video(s) Outcome: Progressing   Problem: Activity: Goal: Ability to tolerate increased activity will improve Outcome: Progressing Goal: Will verbalize the importance of balancing activity with adequate rest periods Outcome: Progressing   Problem: Respiratory: Goal: Ability to maintain a clear airway will improve Outcome: Progressing Goal: Levels of oxygenation will improve Outcome: Progressing Goal: Ability to maintain adequate ventilation will improve Outcome: Progressing   Problem: Education: Goal: Ability to describe self-care measures that may prevent or decrease complications (Diabetes Survival Skills Education) will improve Outcome: Progressing Goal: Individualized Educational Video(s) Outcome: Progressing   Problem: Coping: Goal: Ability to adjust to condition or change in health will improve Outcome: Progressing   Problem: Fluid Volume: Goal: Ability to maintain a balanced intake and output will improve Outcome: Progressing   Problem: Health  Behavior/Discharge Planning: Goal: Ability to identify and utilize available resources and services will improve Outcome: Progressing Goal: Ability to manage health-related needs will improve Outcome: Progressing   Problem: Metabolic: Goal: Ability to maintain appropriate glucose levels will improve Outcome: Progressing   Problem: Nutritional: Goal: Maintenance of adequate nutrition will improve Outcome: Progressing Goal: Progress toward achieving an optimal weight will improve Outcome: Progressing   Problem: Skin Integrity: Goal: Risk for impaired skin integrity will decrease Outcome: Progressing   Problem: Tissue Perfusion: Goal: Adequacy of tissue perfusion will improve Outcome: Progressing   Problem: Education: Goal: Knowledge of General Education information will improve Description: Including pain rating scale, medication(s)/side effects and non-pharmacologic comfort measures Outcome: Progressing   Problem: Health Behavior/Discharge Planning: Goal: Ability to manage health-related needs will improve Outcome: Progressing   Problem: Clinical Measurements: Goal: Ability to maintain clinical measurements within normal limits will improve Outcome: Progressing Goal: Will remain free from infection Outcome: Progressing Goal: Diagnostic test results will improve Outcome: Progressing Goal: Respiratory complications will improve Outcome: Progressing Goal: Cardiovascular complication will be avoided Outcome: Progressing   Problem: Activity: Goal: Risk for activity intolerance will decrease Outcome: Progressing   Problem: Nutrition: Goal: Adequate nutrition will be maintained Outcome: Progressing   Problem: Coping: Goal: Level of anxiety will decrease Outcome: Progressing   Problem: Elimination: Goal: Will not experience complications related to bowel motility Outcome: Progressing Goal: Will not experience complications related to urinary retention Outcome:  Progressing   Problem: Pain Managment: Goal: General experience of comfort will improve Outcome: Progressing   Problem: Safety: Goal: Ability to remain free from injury will improve Outcome: Progressing   Problem: Skin Integrity: Goal: Risk for impaired skin integrity will decrease Outcome: Progressing

## 2023-03-22 NOTE — Progress Notes (Signed)
Patient placed back on 1L of O2 due to O2 being 88. When placed on O2 patients O2 was 91. LPN asked patient did she want her insulin due to her sugar being 174. The sliding scale would require her to have 2 units of novolog. Patient asked LPN how her sugar can go from 278 to 88 now to 174. LPN explained to patient its due to the juice she was given. LPN asked patient did she want the novolog. Patient ignored LPN.

## 2023-03-22 NOTE — NC FL2 (Addendum)
[place] MEDICAID [id number] LEVEL OF CARE FORM     IDENTIFICATION  Patient Name: Courtney Bennett Birthdate: Sep 09, 1965 Sex: female Admission Date (Current Location): 03/14/2023  [place] and [place] Number:  Haynes Bast [id number] T Facility and Address:  [facility], [address], [place], [place] [address]      Provider Number: [id number]  Attending Physician Name and Address:  Burnadette Pop, MD  Relative Name and Phone Number:  Tylene Fantasia; Brother; [phone]    Current Level of Care: Hospital Recommended Level of Care: Assisted Living Facility ([facility]) Prior Approval Number:    Date Approved/Denied:   PASRR Number:    Discharge Plan: Other (Comment) (ALF ([facility]))    Current Diagnoses: Patient Active Problem List   Diagnosis Date Noted   Acute respiratory failure with hypoxemia (HCC) 03/15/2023   COPD with acute exacerbation (HCC) 03/14/2023   Acute respiratory failure with hypoxia (HCC) 03/14/2023   Protein-calorie malnutrition, severe 12/31/2022   Atypical chest pain 12/29/2022   Essential hypertension 12/29/2022   DM (diabetes mellitus) (HCC) 12/01/2022   Gastric distention 11/30/2022   Hav (hallux abducto valgus), unspecified laterality 11/04/2022   COPD (chronic obstructive pulmonary disease) (HCC) 01/22/2022   Homeless 01/22/2022   Tobacco dependence 12/27/2019   Aspiration pneumonia (HCC) 12/10/2019   Paroxysmal atrial flutter (HCC) 12/10/2019   Fibromyalgia 02/27/2019   Bipolar affective disorder, current episode hypomanic (HCC) 08/10/2014   Paranoid (HCC) 08/10/2014    Orientation RESPIRATION BLADDER Height & Weight     Self, Time, Situation, Place  Normal Continent Weight: 97 lb (44 kg) Height:  5\' 3"  (160 cm)  BEHAVIORAL SYMPTOMS/MOOD NEUROLOGICAL BOWEL NUTRITION STATUS      Continent Diet (Please see dc summary (Diabetic; Heart Health Diet))  AMBULATORY STATUS COMMUNICATION OF NEEDS Skin    Independent Verbally Normal                       Personal Care Assistance Level of Assistance  Bathing, Feeding, Dressing Bathing Assistance: Independent Feeding assistance: Independent Dressing Assistance: Independent     Functional Limitations Info  Sight, Hearing, Speech Sight Info: Adequate Hearing Info: Adequate Speech Info: Adequate    SPECIAL CARE FACTORS FREQUENCY                       Contractures Contractures Info: Not present    Additional Factors Info  Insulin Sliding Scale, Allergies, Code Status Code Status Info: DNR- Limited (Do Not Intubate/DNI) Allergies Info: Nichel, Oxycodone, Metformin, Elemental [place], Lamictal, Latex, Sulfa, Tape   Insulin Sliding Scale Info: Novolog Insulin- Moderate Scale 3x per week and Q HS; Semglee Q HS       Current Medications (03/22/2023):  This is the current hospital active medication list Current Facility-Administered Medications  Medication Dose Route Frequency Provider Last Rate Last Admin   acetaminophen (TYLENOL) tablet 650 mg  650 mg Oral Q6H PRN Synetta Fail, MD   650 mg at 03/21/23 1236   Or   acetaminophen (TYLENOL) suppository 650 mg  650 mg Rectal Q6H PRN Synetta Fail, MD       amLODipine (NORVASC) tablet 10 mg  10 mg Oral Daily Burnadette Pop, MD   10 mg at 03/22/23 0831   budesonide (PULMICORT) nebulizer solution 0.25 mg  0.25 mg Nebulization BID Pamella Pert M, MD   0.25 mg at 03/22/23 0740   enoxaparin (LOVENOX) injection 30 mg  30 mg Subcutaneous Q24H Alinda Money,  Cecille Po, MD   30 mg at 03/21/23 2148   feeding supplement (ENSURE ENLIVE / ENSURE PLUS) liquid 237 mL  237 mL Oral TID BM Burnadette Pop, MD   237 mL at 03/19/23 1706   guaiFENesin (MUCINEX) 12 hr tablet 600 mg  600 mg Oral BID Burnadette Pop, MD   600 mg at 03/22/23 0831   insulin aspart (novoLOG) injection 0-5 Units  0-5 Units Subcutaneous QHS Burnadette Pop, MD   4 Units at 03/21/23 2148   insulin aspart  (novoLOG) injection 0-9 Units  0-9 Units Subcutaneous TID WC Burnadette Pop, MD   3 Units at 03/22/23 0834   insulin aspart (novoLOG) injection 6 Units  6 Units Subcutaneous TID WC Burnadette Pop, MD   6 Units at 03/22/23 0832   insulin glargine-yfgn (SEMGLEE) injection 20 Units  20 Units Subcutaneous Daily Adhikari, Amrit, MD       ipratropium-albuterol (DUONEB) 0.5-2.5 (3) MG/3ML nebulizer solution 3 mL  3 mL Nebulization BID Burnadette Pop, MD   3 mL at 03/22/23 0740   losartan (COZAAR) tablet 50 mg  50 mg Oral Daily Synetta Fail, MD   50 mg at 03/22/23 0831   oxyCODONE (Oxy IR/ROXICODONE) immediate release tablet 5 mg  5 mg Oral Q6H PRN Burnadette Pop, MD       pantoprazole (PROTONIX) EC tablet 40 mg  40 mg Oral BID Synetta Fail, MD   40 mg at 03/22/23 1478   polyethylene glycol (MIRALAX / GLYCOLAX) packet 17 g  17 g Oral Daily PRN Synetta Fail, MD       sodium chloride flush (NS) 0.9 % injection 3 mL  3 mL Intravenous Q12H Synetta Fail, MD   3 mL at 03/22/23 0836     Discharge Medications: Medication List       STOP taking these medications     pantoprazole 40 MG tablet Commonly known as: PROTONIX           TAKE these medications     Accu-Chek Guide test strip Generic drug: glucose blood Use twice daily    albuterol 108 (90 Base) MCG/ACT inhaler Commonly known as: VENTOLIN HFA Inhale 1-2 puffs into the lungs every 6 (six) hours as needed. What changed:  how much to take reasons to take this    amLODipine 10 MG tablet Commonly known as: NORVASC Take 1 tablet (10 mg total) by mouth daily. Start taking on: March 23, 2023    aspirin EC 81 MG tablet Take 1 tablet (81 mg total) by mouth daily. Swallow whole.    BD Pen Needle Nano U/F 32G X 4 MM Misc Generic drug: Insulin Pen Needle Use 2 times per day    blood glucose meter kit and supplies Dispense based on patient and insurance preference. Use up to four times daily as directed.  (FOR ICD-10 E10.9, E11.9).    estrogens (conjugated) 0.3 MG tablet Commonly known as: Premarin Take 1 tablet (0.3 mg total) by mouth daily.    FreeStyle Libre 14 Day Sensor Misc 1 application by Subdermal route See admin instructions. Check blood glucose 1-2 times daily. E11.65    gabapentin 100 MG capsule Commonly known as: NEURONTIN Take 1 capsule (100 mg total) by mouth 2 (two) times daily.    guaiFENesin 600 MG 12 hr tablet Commonly known as: MUCINEX Take 1 tablet (600 mg total) by mouth 2 (two) times daily for 7 days.    HumaLOG KwikPen 200 UNIT/ML KwikPen Generic drug: insulin  lispro Inject 8 Units into the skin 3 (three) times daily with meals. 8 units 3 times per day before meals + correctional insulin MDD 50 units per day Subcutaneous for 30 days    ketoconazole 2 % shampoo Commonly known as: NIZORAL APPLY 1 APPLICATION TOPICALLY 2 (TWO) TIMES A WEEK.    losartan 50 MG tablet Commonly known as: COZAAR Take 1 tablet (50 mg total) by mouth daily.    nitroGLYCERIN 0.4 MG SL tablet Commonly known as: NITROSTAT Place 1 tablet (0.4 mg total) under the tongue every 5 (five) minutes as needed for chest pain.    Evaristo Bury FlexTouch 100 UNIT/ML FlexTouch Pen Generic drug: insulin degludec Inject 20 Units into the skin daily. What changed: how much to take    umeclidinium bromide 62.5 MCG/ACT Aepb Commonly known as: INCRUSE ELLIPTA Inhale 1 puff into the lungs daily.     Relevant Imaging Results:  Relevant Lab Results:   Additional Information SS# [id number]  Marliss Coots, LCSW

## 2023-04-19 ENCOUNTER — Other Ambulatory Visit (HOSPITAL_COMMUNITY): Payer: Self-pay

## 2023-06-04 ENCOUNTER — Inpatient Hospital Stay (HOSPITAL_COMMUNITY)
Admission: EM | Admit: 2023-06-04 | Discharge: 2023-06-09 | DRG: 638 | Disposition: A | Discharge status: Home / Self Care | Payer: 59 | Attending: Internal Medicine | Admitting: Internal Medicine

## 2023-06-04 ENCOUNTER — Encounter (HOSPITAL_COMMUNITY): Payer: Self-pay | Admitting: Emergency Medicine

## 2023-06-04 DIAGNOSIS — Z79899 Other long term (current) drug therapy: Secondary | ICD-10-CM

## 2023-06-04 DIAGNOSIS — F17201 Nicotine dependence, unspecified, in remission: Secondary | ICD-10-CM | POA: Diagnosis present

## 2023-06-04 DIAGNOSIS — R443 Hallucinations, unspecified: Secondary | ICD-10-CM | POA: Diagnosis present

## 2023-06-04 DIAGNOSIS — Z90710 Acquired absence of both cervix and uterus: Secondary | ICD-10-CM

## 2023-06-04 DIAGNOSIS — R45851 Suicidal ideations: Principal | ICD-10-CM | POA: Diagnosis present

## 2023-06-04 DIAGNOSIS — E1165 Type 2 diabetes mellitus with hyperglycemia: Secondary | ICD-10-CM | POA: Diagnosis not present

## 2023-06-04 DIAGNOSIS — Z9151 Personal history of suicidal behavior: Secondary | ICD-10-CM

## 2023-06-04 DIAGNOSIS — I10 Essential (primary) hypertension: Secondary | ICD-10-CM | POA: Diagnosis present

## 2023-06-04 DIAGNOSIS — F419 Anxiety disorder, unspecified: Secondary | ICD-10-CM | POA: Diagnosis present

## 2023-06-04 DIAGNOSIS — Z560 Unemployment, unspecified: Secondary | ICD-10-CM

## 2023-06-04 DIAGNOSIS — L0292 Furuncle, unspecified: Secondary | ICD-10-CM | POA: Diagnosis present

## 2023-06-04 DIAGNOSIS — Z882 Allergy status to sulfonamides status: Secondary | ICD-10-CM

## 2023-06-04 DIAGNOSIS — I5189 Other ill-defined heart diseases: Secondary | ICD-10-CM

## 2023-06-04 DIAGNOSIS — F319 Bipolar disorder, unspecified: Secondary | ICD-10-CM | POA: Diagnosis present

## 2023-06-04 DIAGNOSIS — J449 Chronic obstructive pulmonary disease, unspecified: Secondary | ICD-10-CM | POA: Diagnosis present

## 2023-06-04 DIAGNOSIS — F22 Delusional disorders: Secondary | ICD-10-CM | POA: Diagnosis not present

## 2023-06-04 DIAGNOSIS — Z681 Body mass index (BMI) 19 or less, adult: Secondary | ICD-10-CM

## 2023-06-04 DIAGNOSIS — R636 Underweight: Secondary | ICD-10-CM | POA: Diagnosis present

## 2023-06-04 DIAGNOSIS — I110 Hypertensive heart disease with heart failure: Secondary | ICD-10-CM | POA: Diagnosis present

## 2023-06-04 DIAGNOSIS — Z833 Family history of diabetes mellitus: Secondary | ICD-10-CM

## 2023-06-04 DIAGNOSIS — Z885 Allergy status to narcotic agent status: Secondary | ICD-10-CM

## 2023-06-04 DIAGNOSIS — Z794 Long term (current) use of insulin: Secondary | ICD-10-CM

## 2023-06-04 DIAGNOSIS — F17200 Nicotine dependence, unspecified, uncomplicated: Secondary | ICD-10-CM | POA: Diagnosis present

## 2023-06-04 DIAGNOSIS — Z91040 Latex allergy status: Secondary | ICD-10-CM

## 2023-06-04 DIAGNOSIS — Z5902 Unsheltered homelessness: Secondary | ICD-10-CM

## 2023-06-04 DIAGNOSIS — W57XXXA Bitten or stung by nonvenomous insect and other nonvenomous arthropods, initial encounter: Secondary | ICD-10-CM | POA: Diagnosis present

## 2023-06-04 DIAGNOSIS — Z5900 Homelessness unspecified: Secondary | ICD-10-CM

## 2023-06-04 DIAGNOSIS — F310 Bipolar disorder, current episode hypomanic: Secondary | ICD-10-CM | POA: Diagnosis present

## 2023-06-04 DIAGNOSIS — Z91410 Personal history of adult physical and sexual abuse: Secondary | ICD-10-CM

## 2023-06-04 DIAGNOSIS — E11649 Type 2 diabetes mellitus with hypoglycemia without coma: Secondary | ICD-10-CM | POA: Diagnosis not present

## 2023-06-04 DIAGNOSIS — Z91048 Other nonmedicinal substance allergy status: Secondary | ICD-10-CM

## 2023-06-04 DIAGNOSIS — M797 Fibromyalgia: Secondary | ICD-10-CM | POA: Diagnosis present

## 2023-06-04 DIAGNOSIS — Z91148 Patient's other noncompliance with medication regimen for other reason: Secondary | ICD-10-CM

## 2023-06-04 DIAGNOSIS — I5032 Chronic diastolic (congestive) heart failure: Secondary | ICD-10-CM | POA: Diagnosis present

## 2023-06-04 DIAGNOSIS — N951 Menopausal and female climacteric states: Secondary | ICD-10-CM

## 2023-06-04 LAB — ETHANOL: Alcohol, Ethyl (B): <10 mg/dL (ref <10)

## 2023-06-04 LAB — COMPREHENSIVE METABOLIC PANEL
ALT: 11 U/L (ref 0–44)
AST: 16 U/L (ref 15–41)
Albumin: 3.7 g/dL (ref 3.5–5.0)
Alkaline Phosphatase: 77 U/L (ref 38–126)
Anion gap: 8 (ref 5–15)
BUN: 25 mg/dL — ABNORMAL HIGH (ref 6–20)
CO2: 25 mmol/L (ref 22–32)
Calcium: 8.8 mg/dL — ABNORMAL LOW (ref 8.9–10.3)
Chloride: 97 mmol/L — ABNORMAL LOW (ref 98–111)
Creatinine, Ser: 0.46 mg/dL (ref 0.44–1.00)
GFR, Estimated: >60 mL/min (ref >60)
Glucose, Bld: 382 mg/dL — ABNORMAL HIGH (ref 70–99)
Potassium: 4.7 mmol/L (ref 3.5–5.1)
Sodium: 130 mmol/L — ABNORMAL LOW (ref 135–145)
Total Bilirubin: 0.9 mg/dL (ref 0.0–1.2)
Total Protein: 6.5 g/dL (ref 6.5–8.1)

## 2023-06-04 LAB — CBC WITH DIFFERENTIAL/PLATELET
Abs Immature Granulocytes: 0.03 10*3/uL (ref 0.00–0.07)
Basophils Absolute: 0.1 10*3/uL (ref 0.0–0.1)
Basophils Relative: 1 %
Eosinophils Absolute: 0.1 10*3/uL (ref 0.0–0.5)
Eosinophils Relative: 1 %
HCT: 51.4 % — ABNORMAL HIGH (ref 36.0–46.0)
Hemoglobin: 17.3 g/dL — ABNORMAL HIGH (ref 12.0–15.0)
Immature Granulocytes: 0 %
Lymphocytes Relative: 18 %
Lymphs Abs: 1.9 10*3/uL (ref 0.7–4.0)
MCH: 31.7 pg (ref 26.0–34.0)
MCHC: 33.7 g/dL (ref 30.0–36.0)
MCV: 94.1 fL (ref 80.0–100.0)
Monocytes Absolute: 0.8 10*3/uL (ref 0.1–1.0)
Monocytes Relative: 8 %
Neutro Abs: 7.5 10*3/uL (ref 1.7–7.7)
Neutrophils Relative %: 72 %
Platelets: 245 10*3/uL (ref 150–400)
RBC: 5.46 MIL/uL — ABNORMAL HIGH (ref 3.87–5.11)
RDW: 12.5 % (ref 11.5–15.5)
WBC: 10.4 10*3/uL (ref 4.0–10.5)
nRBC: 0 % (ref 0.0–0.2)

## 2023-06-04 LAB — CBG MONITORING, ED: Glucose-Capillary: 565 mg/dL (ref 70–99)

## 2023-06-04 LAB — RAPID URINE DRUG SCREEN, HOSP PERFORMED
Amphetamines: NOT DETECTED
Barbiturates: NOT DETECTED
Benzodiazepines: NOT DETECTED
Cocaine: NOT DETECTED
Opiates: NOT DETECTED
Tetrahydrocannabinol: NOT DETECTED

## 2023-06-04 LAB — I-STAT CHEM 8, ED
BUN: 30 mg/dL — ABNORMAL HIGH (ref 6–20)
Calcium, Ion: 1.17 mmol/L (ref 1.15–1.40)
Chloride: 95 mmol/L — ABNORMAL LOW (ref 98–111)
Creatinine, Ser: 0.4 mg/dL — ABNORMAL LOW (ref 0.44–1.00)
Glucose, Bld: 403 mg/dL — ABNORMAL HIGH (ref 70–99)
HCT: 51 % — ABNORMAL HIGH (ref 36.0–46.0)
Hemoglobin: 17.3 g/dL — ABNORMAL HIGH (ref 12.0–15.0)
Potassium: 4.8 mmol/L (ref 3.5–5.1)
Sodium: 132 mmol/L — ABNORMAL LOW (ref 135–145)
TCO2: 30 mmol/L (ref 22–32)

## 2023-06-04 LAB — SALICYLATE LEVEL: Salicylate Lvl: <7 mg/dL — ABNORMAL LOW (ref 7.0–30.0)

## 2023-06-04 LAB — ACETAMINOPHEN LEVEL: Acetaminophen (Tylenol), Serum: <10 ug/mL — ABNORMAL LOW (ref 10–30)

## 2023-06-04 MED ORDER — SODIUM CHLORIDE 0.9 % IV BOLUS
500.0000 mL | Freq: Once | INTRAVENOUS | Status: AC
  Administered 2023-06-04: 500 mL via INTRAVENOUS

## 2023-06-04 MED ORDER — INSULIN ASPART 100 UNIT/ML IJ SOLN
8.0000 [IU] | Freq: Once | INTRAMUSCULAR | Status: AC
  Administered 2023-06-04: 8 [IU] via SUBCUTANEOUS
  Filled 2023-06-04: qty 0.08

## 2023-06-04 MED ORDER — INSULIN ASPART PROT & ASPART (70-30 MIX) 100 UNIT/ML ~~LOC~~ SUSP: 8.0000 [IU] | Freq: Once | SUBCUTANEOUS | Status: DC

## 2023-06-04 NOTE — ED Triage Notes (Signed)
 Pt arrives with [facility] response team who was told by pts friend that she has been hallucinating and paranoid. Pt has been out of insulin  for a few weeks. Pt homeless and has trouble with medications. Pt states sometimes it seems easier to die than go through this but I know God doesn't want me to do that.
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
## 2023-06-04 NOTE — ED Notes (Signed)
 Pt refusing CBG recheck. Pt offered food and drink but refusing it

## 2023-06-04 NOTE — ED Notes (Signed)
 EKG given to Jobe Igo, MD

## 2023-06-05 ENCOUNTER — Encounter (HOSPITAL_COMMUNITY): Payer: Self-pay | Admitting: Psychiatric/Mental Health

## 2023-06-05 DIAGNOSIS — F22 Delusional disorders: Secondary | ICD-10-CM | POA: Diagnosis not present

## 2023-06-05 DIAGNOSIS — F319 Bipolar disorder, unspecified: Secondary | ICD-10-CM

## 2023-06-05 LAB — BASIC METABOLIC PANEL
Anion gap: 9 (ref 5–15)
BUN: 22 mg/dL — ABNORMAL HIGH (ref 6–20)
CO2: 25 mmol/L (ref 22–32)
Calcium: 8.9 mg/dL (ref 8.9–10.3)
Chloride: 93 mmol/L — ABNORMAL LOW (ref 98–111)
Creatinine, Ser: 0.58 mg/dL (ref 0.44–1.00)
GFR, Estimated: >60 mL/min (ref >60)
Glucose, Bld: 608 mg/dL (ref 70–99)
Potassium: 4.9 mmol/L (ref 3.5–5.1)
Sodium: 127 mmol/L — ABNORMAL LOW (ref 135–145)

## 2023-06-05 LAB — CBG MONITORING, ED
Glucose-Capillary: 276 mg/dL — ABNORMAL HIGH (ref 70–99)
Glucose-Capillary: 415 mg/dL — ABNORMAL HIGH (ref 70–99)
Glucose-Capillary: 430 mg/dL — ABNORMAL HIGH (ref 70–99)
Glucose-Capillary: 581 mg/dL (ref 70–99)
Glucose-Capillary: 60 mg/dL — ABNORMAL LOW (ref 70–99)
Glucose-Capillary: >600 mg/dL (ref 70–99)
Glucose-Capillary: >600 mg/dL (ref 70–99)

## 2023-06-05 MED ORDER — SODIUM CHLORIDE 0.9 % IV BOLUS
1000.0000 mL | Freq: Once | INTRAVENOUS | Status: AC
  Administered 2023-06-05: 1000 mL via INTRAVENOUS

## 2023-06-05 MED ORDER — ZIPRASIDONE MESYLATE 20 MG IM SOLR
10.0000 mg | Freq: Once | INTRAMUSCULAR | Status: AC
  Administered 2023-06-05: 10 mg via INTRAMUSCULAR

## 2023-06-05 MED ORDER — INSULIN ASPART 100 UNIT/ML IJ SOLN
10.0000 [IU] | Freq: Once | INTRAMUSCULAR | Status: AC
  Administered 2023-06-05: 10 [IU] via INTRAVENOUS
  Filled 2023-06-05: qty 0.1

## 2023-06-05 MED ORDER — INSULIN ASPART PROT & ASPART (70-30 MIX) 100 UNIT/ML ~~LOC~~ SUSP
8.0000 [IU] | Freq: Three times a day (TID) | SUBCUTANEOUS | Status: DC
Start: 2023-06-05 — End: 2023-06-05

## 2023-06-05 MED ORDER — ALBUTEROL SULFATE (2.5 MG/3ML) 0.083% IN NEBU: 2.5000 mg | INHALATION_SOLUTION | Freq: Four times a day (QID) | RESPIRATORY_TRACT | Status: DC | PRN

## 2023-06-05 MED ORDER — LORAZEPAM 2 MG/ML IJ SOLN
1.0000 mg | Freq: Once | INTRAMUSCULAR | Status: AC
  Administered 2023-06-05: 1 mg via INTRAMUSCULAR

## 2023-06-05 MED ORDER — ZIPRASIDONE MESYLATE 20 MG IM SOLR
20.0000 mg | Freq: Once | INTRAMUSCULAR | Status: DC
  Filled 2023-06-05: qty 20

## 2023-06-05 MED ORDER — UMECLIDINIUM BROMIDE 62.5 MCG/ACT IN AEPB
1.0000 | INHALATION_SPRAY | Freq: Every day | RESPIRATORY_TRACT | Status: DC | PRN
  Filled 2023-06-05: qty 7

## 2023-06-05 MED ORDER — INSULIN DEGLUDEC 100 UNIT/ML ~~LOC~~ SOPN: 20.0000 [IU] | PEN_INJECTOR | SUBCUTANEOUS | Status: DC

## 2023-06-05 MED ORDER — INSULIN ASPART 100 UNIT/ML IJ SOLN
8.0000 [IU] | Freq: Three times a day (TID) | INTRAMUSCULAR | Status: DC
  Administered 2023-06-06 – 2023-06-09 (×11): 8 [IU] via SUBCUTANEOUS
  Filled 2023-06-05: qty 0.08

## 2023-06-05 MED ORDER — INSULIN ASPART 100 UNIT/ML IJ SOLN
0.0000 [IU] | Freq: Three times a day (TID) | INTRAMUSCULAR | Status: DC
Start: 2023-06-05 — End: 2023-06-09
  Administered 2023-06-05: 15 [IU] via SUBCUTANEOUS
  Administered 2023-06-06: 8 [IU] via SUBCUTANEOUS
  Administered 2023-06-06: 15 [IU] via SUBCUTANEOUS
  Administered 2023-06-07: 11 [IU] via SUBCUTANEOUS
  Administered 2023-06-07: 2 [IU] via SUBCUTANEOUS
  Administered 2023-06-07: 11 [IU] via SUBCUTANEOUS
  Administered 2023-06-08: 5 [IU] via SUBCUTANEOUS
  Administered 2023-06-08: 8 [IU] via SUBCUTANEOUS
  Administered 2023-06-08 – 2023-06-09 (×2): 3 [IU] via SUBCUTANEOUS
  Administered 2023-06-09: 8 [IU] via SUBCUTANEOUS
  Filled 2023-06-05: qty 0.15

## 2023-06-05 MED ORDER — ACETAMINOPHEN 500 MG PO TABS
1000.0000 mg | ORAL_TABLET | Freq: Four times a day (QID) | ORAL | Status: DC | PRN
  Administered 2023-06-06: 1000 mg via ORAL
  Filled 2023-06-05: qty 2

## 2023-06-05 MED ORDER — ALBUTEROL SULFATE HFA 108 (90 BASE) MCG/ACT IN AERS: 1.0000 | INHALATION_SPRAY | Freq: Four times a day (QID) | RESPIRATORY_TRACT | Status: DC | PRN

## 2023-06-05 MED ORDER — INSULIN ASPART 100 UNIT/ML IJ SOLN
0.0000 [IU] | Freq: Every day | INTRAMUSCULAR | Status: DC
  Filled 2023-06-05: qty 0.05

## 2023-06-05 MED ORDER — INSULIN GLARGINE-YFGN 100 UNIT/ML ~~LOC~~ SOLN
20.0000 [IU] | SUBCUTANEOUS | Status: DC
  Administered 2023-06-05: 20 [IU] via SUBCUTANEOUS
  Filled 2023-06-05 (×2): qty 0.2

## 2023-06-05 MED ORDER — LORAZEPAM 2 MG/ML IJ SOLN
2.0000 mg | Freq: Once | INTRAMUSCULAR | Status: DC
  Filled 2023-06-05: qty 1

## 2023-06-05 NOTE — ED Notes (Signed)
 Pt's belongings were placed in Cabinet fro Pts in 19-22, 2 patient belonging bags of clothes and one blue tote bag of personal items

## 2023-06-05 NOTE — ED Notes (Signed)
 Patient provided with peanut butter, cheese, and diet Ginger Ale (per patient request) due to rapid drop in CBG.  No changes in mental status noted.  Will continue to monitor

## 2023-06-05 NOTE — ED Notes (Signed)
 Pt given coffee, peanut butter and graham cracker.

## 2023-06-05 NOTE — ED Provider Notes (Addendum)
 Emergency Medicine Observation Re-evaluation Note  Courtney Bennett is a 58 y.o. female, seen on rounds today.  Pt initially presented to the ED for complaints of Hallucinations and Hyperglycemia Currently, the patient is asleep.  Pt presented with hallucinations and agitation.  Pt required IVC papers and geodon  and ativan  due to agitation.  Physical Exam  BP 122/73 (BP Location: Left Arm)   Pulse 92   Temp 98.1 F (36.7 C) (Oral)   Resp 14   Ht 5' 3 (1.6 m)   Wt 44 kg   LMP 11/24/2014   SpO2 94%   BMI 17.18 kg/m  Physical Exam General: asleep Cardiac: rr Lungs: clear Psych: calm  ED Course / MDM  EKG:EKG Interpretation Date/Time:  Friday June 04 2023 17:46:10 EST Ventricular Rate:  83 PR Interval:  194 QRS Duration:  93 QT Interval:  369 QTC Calculation: 434 R Axis:   75  Text Interpretation: Sinus rhythm Right atrial enlargement Consider left ventricular hypertrophy Nonspecific T abnormalities, lateral leads ST elevation, consider anterior injury st and t changes increased since last tracing Confirmed by Randol Simmonds [id number]) on 06/05/2023 7:19:24 AM  I have reviewed the labs performed to date as well as medications administered while in observation.  Recent changes in the last 24 hours include medical clearance.  Plan  Current plan is for psych eval.    Dean Clarity, MD 06/05/23 0732  Pt is a poorly controlled diabetic without diabetic diet or diabetic meds ordered.  She is a vegetarian, so a vegetarian diet ordered.  Pt's home insulin  ordered.      Dean Clarity, MD 06/05/23 [phone]
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
## 2023-06-05 NOTE — ED Notes (Signed)
 Pt refusing to speak with tele coordinator

## 2023-06-05 NOTE — ED Provider Notes (Addendum)
  Physical Exam  BP (!) 156/68   Pulse 87   Temp 98.1 F (36.7 C) (Oral)   Resp 16   Ht 5' 3 (1.6 m)   Wt 44 kg   LMP 11/24/2014   SpO2 95%   BMI 17.18 kg/m   Physical Exam  Procedures  .Critical Care  Performed by: Logan Ubaldo NOVAK, PA-C Authorized by: Logan Ubaldo NOVAK, PA-C   Critical care provider statement:    Critical care time (minutes):  30   Critical care was necessary to treat or prevent imminent or life-threatening deterioration of the following conditions: Chemical restraint due to psychosis/agitation.   Critical care was time spent personally by me on the following activities:  Development of treatment plan with patient or surrogate, discussions with consultants, evaluation of patient's response to treatment, examination of patient, ordering and review of laboratory studies, ordering and review of radiographic studies, ordering and performing treatments and interventions, pulse oximetry, re-evaluation of patient's condition and review of old charts   ED Course / MDM    Medical Decision Making  Patient initially in the emergency room voluntarily requesting evaluation and assistance due to suicidal ideations and reported hallucinations.  Was notified by the paramedic caring for patient during the night the patient was becoming upset, refused her TTS evaluation and was going to potentially leave the department.  Reviewed notes from earlier provider and with patient's reported SI thought the patient required IVC at this time until TTS could evaluate.  IVC paperwork filed.  Patient continued to be agitated, became aggressive with staff.  Required chemical restraint with Geodon .  TTS evaluation still pending.     Logan Ubaldo NOVAK, PA-C 06/05/23 0352    Logan Ubaldo NOVAK, PA-C 06/05/23 0406    Carita Senior, MD 06/05/23 [phone]

## 2023-06-05 NOTE — ED Notes (Signed)
 Pt refusing to keep door to room open. Pt refusing to change into purple scrubs. Pt angry because she hasn't had anything to eat all day, but when offered food, refuses. Pt yelling at staff and refusing any treatment. Pt threatening to leave

## 2023-06-05 NOTE — ED Notes (Signed)
 Pt was given a toothbrush and toothpaste.

## 2023-06-05 NOTE — ED Notes (Signed)
 Nurse contacted provider regarding sliding scale. Provider advised to give pt 15units and recheck cbg in .

## 2023-06-05 NOTE — ED Notes (Signed)
 Pt was given dinner tray.

## 2023-06-05 NOTE — ED Notes (Signed)
 Pt refuses to have her vitals and temp taking. I attempted to dress her out in the purple scrub pt started yelling and refused

## 2023-06-05 NOTE — ED Notes (Addendum)
 Pt states she can't have chicken, and that even the smell is making her sick. Pt states she can't have meat so the only other options is graham crackers, saltine crackers, applesauce and cheese. Pt walked away back into the room at this time. Pt explained that she was a diabetic. This writer went into further investigation in her chart and found a initial CBG reading of 565, this raised question if her CBG might be high since she mentions having diabetes. This clinical research associate told Dealer that she was going to check pt CBG. This clinical research associate came to find a critical CBG value of 561, Dara Paramedic was notified, MD was notified as well.

## 2023-06-05 NOTE — ED Notes (Addendum)
 Pt was adamantly refusing to change clothes. Pt left the room and attempted to walk out of the hospital with IVC in process. Pt was brought back to the room by security. Pt now under iVC, but still refusing to change into scrubs or surrender her belongings. Pt had to be changed by staff into scrubs to wear due to adamantly refusing.

## 2023-06-05 NOTE — ED Notes (Signed)
 Pt in room yelling that room is hot, Pt c/o pain everywhere

## 2023-06-06 ENCOUNTER — Other Ambulatory Visit: Payer: Self-pay

## 2023-06-06 ENCOUNTER — Encounter (HOSPITAL_COMMUNITY): Payer: Self-pay | Admitting: Internal Medicine

## 2023-06-06 DIAGNOSIS — I5189 Other ill-defined heart diseases: Secondary | ICD-10-CM

## 2023-06-06 DIAGNOSIS — W57XXXA Bitten or stung by nonvenomous insect and other nonvenomous arthropods, initial encounter: Secondary | ICD-10-CM | POA: Diagnosis present

## 2023-06-06 DIAGNOSIS — E1165 Type 2 diabetes mellitus with hyperglycemia: Secondary | ICD-10-CM | POA: Diagnosis present

## 2023-06-06 HISTORY — DX: Other ill-defined heart diseases: I51.89

## 2023-06-06 LAB — CBG MONITORING, ED
Glucose-Capillary: 124 mg/dL — ABNORMAL HIGH (ref 70–99)
Glucose-Capillary: 199 mg/dL — ABNORMAL HIGH (ref 70–99)
Glucose-Capillary: 274 mg/dL — ABNORMAL HIGH (ref 70–99)
Glucose-Capillary: 336 mg/dL — ABNORMAL HIGH (ref 70–99)
Glucose-Capillary: 300 mg/dL — ABNORMAL HIGH (ref 70–99)
Glucose-Capillary: 346 mg/dL — ABNORMAL HIGH (ref 70–99)
Glucose-Capillary: 401 mg/dL — ABNORMAL HIGH (ref 70–99)

## 2023-06-06 MED ORDER — ACETAMINOPHEN 650 MG RE SUPP
650.0000 mg | Freq: Four times a day (QID) | RECTAL | Status: DC | PRN
Start: 2023-06-06 — End: 2023-06-09

## 2023-06-06 MED ORDER — ENOXAPARIN SODIUM 40 MG/0.4ML IJ SOSY
40.0000 mg | PREFILLED_SYRINGE | INTRAMUSCULAR | Status: DC
  Administered 2023-06-07 – 2023-06-08 (×2): 40 mg via SUBCUTANEOUS
  Filled 2023-06-06 (×3): qty 0.4

## 2023-06-06 MED ORDER — INSULIN GLARGINE-YFGN 100 UNIT/ML ~~LOC~~ SOLN
20.0000 [IU] | Freq: Every day | SUBCUTANEOUS | Status: DC
  Administered 2023-06-06 – 2023-06-08 (×3): 20 [IU] via SUBCUTANEOUS
  Filled 2023-06-06 (×3): qty 0.2

## 2023-06-06 MED ORDER — ACETAMINOPHEN 325 MG PO TABS
650.0000 mg | ORAL_TABLET | Freq: Four times a day (QID) | ORAL | Status: DC | PRN
  Administered 2023-06-06 – 2023-06-09 (×3): 650 mg via ORAL
  Filled 2023-06-06 (×4): qty 2

## 2023-06-06 MED ORDER — SODIUM CHLORIDE 0.45 % IV SOLN
INTRAVENOUS | Status: DC
Start: 2023-06-06 — End: 2023-06-07

## 2023-06-06 MED ORDER — LOSARTAN POTASSIUM 50 MG PO TABS
50.0000 mg | ORAL_TABLET | Freq: Every day | ORAL | Status: DC
  Administered 2023-06-06 – 2023-06-09 (×4): 50 mg via ORAL
  Filled 2023-06-06 (×4): qty 1

## 2023-06-06 MED ORDER — GLUCOSE 40 % PO GEL
1.0000 | ORAL | Status: AC
  Administered 2023-06-06: 31 g via ORAL
  Filled 2023-06-06: qty 1

## 2023-06-06 MED ORDER — ONDANSETRON HCL 4 MG/2ML IJ SOLN: 4.0000 mg | Freq: Four times a day (QID) | INTRAMUSCULAR | Status: DC | PRN

## 2023-06-06 MED ORDER — DOXYCYCLINE HYCLATE 100 MG PO TABS
100.0000 mg | ORAL_TABLET | Freq: Two times a day (BID) | ORAL | Status: DC
  Administered 2023-06-06 – 2023-06-09 (×6): 100 mg via ORAL
  Filled 2023-06-06 (×6): qty 1

## 2023-06-06 MED ORDER — ONDANSETRON HCL 4 MG PO TABS: 4.0000 mg | ORAL_TABLET | Freq: Four times a day (QID) | ORAL | Status: DC | PRN

## 2023-06-06 NOTE — ED Notes (Signed)
 ED MD made aware of patient's recent low blood glucose. Hypoglycemia protocol followed per orders.  Patient given oral glucose.  Stated "I'm trying to eat but it is hard.  I feel like it is still dropping."  Will continue to monitor.

## 2023-06-06 NOTE — ED Notes (Signed)
Pt resting with equal rise and fall of chest.

## 2023-06-06 NOTE — ED Notes (Signed)
 Patient assisted to bathroom.  Steady gait noted.  Pt reports she normally uses a cane or walker.  Walker provided for easier ambulation

## 2023-06-06 NOTE — ED Notes (Signed)
 Patient to room 35. Patient ambulated to room . Patient cooperative. Patient oriented to unit and room.

## 2023-06-06 NOTE — ED Notes (Signed)
Pt walked to bathroom 

## 2023-06-06 NOTE — Plan of Care (Signed)

## 2023-06-06 NOTE — H&P (Addendum)
 History and Physical    Patient: Courtney Bennett FMW:[id number] DOB: 1966/05/20 DOA: 06/04/2023 DOS: the patient was seen and examined on 06/06/2023 PCP: Patient, No Pcp Per  Patient coming from: Home  Chief Complaint:  Chief Complaint  Patient presents with   Hallucinations   Hyperglycemia   HPI: Courtney Bennett is a 58 y.o. female with medical history significant of bipolar 1 disorder, COPD, fibromyalgia, hypertension, type 1 diabetes who was IVCed due to hallucinations with paranoia after being out of insulin  for several weeks.  She is homeless and has difficulty getting/managing her medications.  She has been seen by psychiatry who recommended involuntary commitment and inpatient mental health hospitalization with standard safety precautions.  We are being consulted by the emergency department in today due to significant hyperglycemia as it is precluding her from being transferred to a behavioral health facility.  She is homeless and has not been compliant with her insulin  regimen.  She also complaining of having multiple insect bites and has been at times in wooded areas.  She has had polyuria and polydipsia.  No fever, chills or night sweats. No sore throat, rhinorrhea, dyspnea, wheezing or hemoptysis.  No chest pain, palpitations, diaphoresis, PND, orthopnea or pitting edema of the lower extremities.  Denied abdominal pain, diarrhea, constipation, melena or hematochezia.  No flank pain, dysuria, frequency or hematuria.  Lab work: UDS was normal.  CBC showed white count 10.4, hemoglobin 17.3 g/dL platelets 754.  Unremarkable alcohol, salicylate and acetaminophen  levels.  CMP showed sodium 130 (corrected 136), potassium 4.7, chloride 97 and CO2 25 mmol/L with a normal anion gap.  Glucose 382, BUN 25, creatinine 0.46 mg/dL.  Calcium  is normal after correction and LFTs are unremarkable.   ED course: Initial vital signs were temperature 97.7 F, pulse 90, respiration 18, BP 170/91 mmHg O2 sat 93% on  room air.  Patient received 1000 mL of normal saline bolus and insulin  yesterday.  She had an episode of hypoglycemia with her CBG 60 mg/deciliter earlier in the morning today.  Review of Systems: As mentioned in the history of present illness. All other systems reviewed and are negative. Past Medical History:  Diagnosis Date   Bipolar 1 disorder (HCC)    COPD (chronic obstructive pulmonary disease) (HCC)    Diabetes mellitus    Fibromyalgia    Hypertension    Past Surgical History:  Procedure Laterality Date   ABDOMINAL HYSTERECTOMY  01/2019   BREAST BIOPSY Right    BREAST BIOPSY Right 02/18/2023   US  RT BREAST BX W LOC DEV 1ST LESION IMG BX SPEC US  GUIDE 02/18/2023 [facility] MAMMOGRAPHY   CESAREAN SECTION     HERNIA REPAIR     Social History:  reports that she quit smoking about 8 years ago. Her smoking use included cigarettes. She has never used smokeless tobacco. She reports current alcohol use. She reports that she does not use drugs.  Allergies  Allergen Reactions   Nickel Rash    Rash after ear piercing and navel piercings   Oxycodone -Acetaminophen  Nausea And Vomiting   Metformin  Diarrhea   Elemental Sulfur Hives, Rash and Other (See Comments)    Burns the skin, also   Lamictal [Lamotrigine] Rash   Latex Itching and Rash   Sulfa Antibiotics Rash   Sulfur Hives and Rash    Other Reaction(s): Other  Burns the skin, also   Tape Rash and Other (See Comments)    Irritates the skin    Family History  Problem Relation  Age of Onset   Diabetes Father    Breast cancer Mother    Breast cancer Paternal Grandmother     Prior to Admission medications   Medication Sig Start Date End Date Taking? Authorizing Provider  acetaminophen  (TYLENOL ) 500 MG tablet Take 1,000 mg by mouth every 6 (six) hours as needed for headache or moderate pain (pain score 4-6).   Yes [provider]  albuterol  (VENTOLIN  HFA) 108 (90 Base) MCG/ACT inhaler Inhale 1-2 puffs into the lungs  every 6 (six) hours as needed. Patient taking differently: Inhale 1-2 puffs into the lungs every 6 (six) hours as needed for wheezing or shortness of breath. 03/22/23  Yes Jillian Buttery, MD  APPLE CIDER VINEGAR PO Take 1 tablet by mouth daily.   Yes [provider]  Calcium  Carbonate-Vitamin D (CALCIUM -VITAMIN D PO) Take 2 tablets by mouth daily.   Yes [provider]  CRANBERRY PO Take 2 tablets by mouth daily.   Yes [provider]  Multiple Vitamins-Minerals (CENTRUM ADULT PO) Take 1 tablet by mouth daily.   Yes [provider]  naproxen sodium (ALEVE) 220 MG tablet Take 440 mg by mouth daily as needed (pain).   Yes [provider]  umeclidinium bromide  (INCRUSE ELLIPTA ) 62.5 MCG/ACT AEPB Inhale 1 puff into the lungs daily. Patient taking differently: Inhale 1 puff into the lungs daily as needed (wheezing/SOB). 03/22/23  Yes Jillian Buttery, MD  amLODipine  (NORVASC ) 10 MG tablet Take 1 tablet (10 mg total) by mouth daily. Patient not taking: Reported on 06/05/2023 03/23/23   Jillian Buttery, MD  aspirin  EC 81 MG tablet Take 1 tablet (81 mg total) by mouth daily. Swallow whole. Patient not taking: Reported on 06/05/2023 01/01/23   Leotis Bogus, MD  Continuous Blood Gluc Sensor (FREESTYLE LIBRE 14 DAY SENSOR) MISC 1 application by Subdermal route See admin instructions. Check blood glucose 1-2 times daily. E11.65 05/11/19   CiriglianoRonal POUR, DO  estrogens , conjugated, (PREMARIN ) 0.3 MG tablet Take 1 tablet (0.3 mg total) by mouth daily. Patient not taking: Reported on 06/05/2023 06/16/19   [place] POUR, DO  gabapentin  (NEURONTIN ) 100 MG capsule Take 1 capsule (100 mg total) by mouth 2 (two) times daily. Patient not taking: Reported on 06/05/2023 12/31/22   Leotis Bogus, MD  HUMALOG  KWIKPEN 200 UNIT/ML KwikPen Inject 8 Units into the skin 3 (three) times daily with meals. 8 units 3 times per day before meals + correctional insulin  MDD 50 units per day  Subcutaneous for 30 days Patient not taking: Reported on 06/05/2023 03/22/23   Jillian Buttery, MD  Insulin  Pen Needle 32G X 4 MM MISC Use as directed with insulin  pens 03/22/23   Jillian Buttery, MD  ketoconazole  (NIZORAL ) 2 % shampoo APPLY 1 APPLICATION TOPICALLY 2 (TWO) TIMES A WEEK. Patient not taking: Reported on 06/05/2023 07/13/19   [place]onal POUR, DO  losartan  (COZAAR ) 50 MG tablet Take 1 tablet (50 mg total) by mouth daily. Patient not taking: Reported on 06/05/2023 03/22/23   Jillian Buttery, MD  nitroGLYCERIN  (NITROSTAT ) 0.4 MG SL tablet Place 1 tablet (0.4 mg total) under the tongue every 5 (five) minutes as needed for chest pain. Patient not taking: Reported on 06/05/2023 12/31/22   Leotis Bogus, MD    Physical Exam: Vitals:   06/05/23 1332 06/05/23 2353 06/05/23 2355 06/06/23 0752  BP: 122/73 (!) 175/87 (!) 175/87 (!) 148/84  Pulse: 92 91 91 77  Resp: 14 18 18 16   Temp: 98.1 F (36.7 C) 98.4  F (36.9 C) 98.4 F (36.9 C) 97.8 F (36.6 C)  TempSrc: Oral Oral Oral Oral  SpO2: 94% 95% 95% 94%  Weight:      Height:       Physical Exam Vitals and nursing note reviewed.  Constitutional:      General: She is awake. She is not in acute distress.    Appearance: Normal appearance. She is underweight. She is ill-appearing.  HENT:     Head: Normocephalic.     Nose: No rhinorrhea.     Mouth/Throat:     Mouth: Mucous membranes are dry.  Eyes:     General: No scleral icterus.    Pupils: Pupils are equal, round, and reactive to light.  Neck:     Vascular: No JVD.  Cardiovascular:     Rate and Rhythm: Normal rate and regular rhythm.     Heart sounds: S1 normal and S2 normal.  Pulmonary:     Effort: Pulmonary effort is normal.     Breath sounds: Normal breath sounds. No wheezing, rhonchi or rales.  Abdominal:     General: Bowel sounds are normal. There is no distension.     Palpations: Abdomen is soft.     Tenderness: There is no abdominal tenderness. There is no guarding.   Musculoskeletal:     Cervical back: Neck supple.     Right lower leg: No edema.     Left lower leg: No edema.  Skin:    General: Skin is warm and dry.  Neurological:     General: No focal deficit present.     Mental Status: She is alert and oriented to person, place, and time.  Psychiatric:        Behavior: Behavior is cooperative.    Data Reviewed:  Results are pending, will review when available. 12/29/2022 echocardiogram report. MPRESSIONS:   1. Left ventricular ejection fraction, by estimation, is 60 to 65%. The  left ventricle has normal function. The left ventricle has no regional  wall motion abnormalities. There is mild concentric left ventricular  hypertrophy. Left ventricular diastolic  parameters are consistent with Grade I diastolic dysfunction (impaired  relaxation).   2. Right ventricular systolic function is normal. The right ventricular  size is normal. Tricuspid regurgitation signal is inadequate for assessing  PA pressure.   3. Left atrial size was mildly dilated.   4. The mitral valve is normal in structure. No evidence of mitral valve  regurgitation. No evidence of mitral stenosis.   5. The aortic valve is tricuspid. Aortic valve regurgitation is not  visualized. No aortic stenosis is present.   6. The inferior vena cava is normal in size with <50% respiratory  variability, suggesting right atrial pressure of 8 mmHg.   EKG: Vent. rate 83 BPM PR interval 194 ms QRS duration 93 ms QT/QTcB 369/434 ms P-R-T axes 85 75 269 Sinus rhythm Right atrial enlargement Consider left ventricular hypertrophy Nonspecific T abnormalities, lateral leads ST elevation, consider anterior injury  Assessment and Plan: Principal Problem:   Uncontrolled type 2 diabetes mellitus with hyperglycemia (HCC) Observation/MedSurg. Continue IV fluids. Vegetarian diet. Declined half NS IV fluids. CBG monitoring with RI SS. Check hemoglobin A1c. Homelessness/behavioral  issues are an obstacle for Rx compliance.   Active Problems:   Bipolar affective disorder, current episode hypomanic (HCC)   Paranoid (HCC) Will need transfer to inpatient psych facility. Psychiatry will be following while admitted.     Homeless Will need transfer to psych facility. Consult TOC team.  Tobacco dependence In remission according to the patient.     Essential hypertension Has not been on antihypertensives. Resume losartan  50 mg p.o. daily. Will resume amlodipine  if losartan  not effective.    Insect bites Not sure of the circumstances,. Will start doxycycline  100 mg p.o. twice daily.   Grade I diastolic dysfunction  No clinical signs of decompensation. Resume losartan  50 mg p.o. daily.   Advance Care Planning:   Code Status: Full Code   Consults:   Family Communication:   Severity of Illness: The appropriate patient status for this patient is OBSERVATION. Observation status is judged to be reasonable and necessary in order to provide the required intensity of service to ensure the patient's safety. The patient's presenting symptoms, physical exam findings, and initial radiographic and laboratory data in the context of their medical condition is felt to place them at decreased risk for further clinical deterioration. Furthermore, it is anticipated that the patient will be medically stable for discharge from the hospital within 2 midnights of admission.   Author: Alm Dorn Castor, MD 06/06/2023 1:08 PM  For on call review [email or web address].   This document was prepared using Dragon voice recognition software and may contain some unintended transcription errors.

## 2023-06-06 NOTE — Progress Notes (Signed)
 Patient has been denied by [facility] due to no appropriate beds available. Patient meets BH inpatient criteria per Gailen Friday, NP. Patient has been faxed out to the following facilities:   [facility] [address], [place] [address] [phone]  [facility] Patient Placement [facility], [place] [phone]  [facility] [address] [place] [address] [phone]  [facility] [address] [place] [address] [phone]  [facility] [address] [place] [address] [phone]  [facility] EFAX [address] [place], [place] [phone]  [facility] [address], [place] [address] [phone]  [facility] [address] [place] [address] [phone]  [facility] [address] [place] [address] [phone]  [facility] [address] [facility] [place] [address] [phone]  [facility] [address] [place], [place] [address] [phone]  [facility] [address] [place] [address] [phone]  [facility] [address] [place] [address] [phone]  [facility] [address], [place] [address] [phone]  [facility] [address] [place] [address] [phone]  [facility] [address] [place] [address] [phone]  [facility] [address] [place] [address] [phone]   Bunnie Gallop, MSW, LCSW-A  12:40 PM 06/06/2023

## 2023-06-06 NOTE — ED Provider Notes (Signed)
 Emergency Medicine Observation Re-evaluation Note  Courtney Bennett is a 58 y.o. female, seen on rounds today.  Pt initially presented to the ED for complaints of Hallucinations and Hyperglycemia Currently, the patient is resting quietly.  Physical Exam  BP (!) 175/87   Pulse 91   Temp 98.4 F (36.9 C) (Oral)   Resp 18   Ht 5' 3 (1.6 m)   Wt 44 kg   LMP 11/24/2014   SpO2 95%   BMI 17.18 kg/m  Physical Exam General: No acute distress Cardiac: Well-perfused Lungs: Nonlabored Psych: Calm  ED Course / MDM  EKG:EKG Interpretation Date/Time:  Friday June 04 2023 17:46:10 EST Ventricular Rate:  83 PR Interval:  194 QRS Duration:  93 QT Interval:  369 QTC Calculation: 434 R Axis:   75  Text Interpretation: Sinus rhythm Right atrial enlargement Consider left ventricular hypertrophy Nonspecific T abnormalities, lateral leads ST elevation, consider anterior injury st and t changes increased since last tracing Confirmed by Randol Simmonds [phone]) on 06/05/2023 7:19:24 AM  I have reviewed the labs performed to date as well as medications administered while in observation.  Recent changes in the last 24 hours include psychiatry assessment.  Had a few low blood sugars  Plan  Current plan is for inpatient psychiatric.  1 PM.  Psychiatry reached out to me they are having problems placing the patient due to her alterations in blood sugar.  I placed a medicine consult to Dr. Celinda who will evaluate the patient for either medical admission or will make some medical recommendations for her further blood pressure management.    Courtney Ozell BROCKS, MD 06/06/23 [phone]

## 2023-06-06 NOTE — ED Notes (Addendum)
 Patient becoming agitated, asking why she has someone watching her like she is a prisoner, asking for her clothes, she's hungry, offered multiple options for snacks until breakfast comes. Also wanting to read her IVC paperwork. RN explained what IVC paperwork said. Patient yelled the IVC was not a legal document. Security called to bedside.

## 2023-06-06 NOTE — ED Notes (Signed)
 ED TO INPATIENT HANDOFF REPORT  Name/Age/Gender Courtney Bennett 58 y.o. female  Code Status    Code Status Orders  (From admission, onward)           Start     Ordered   06/06/23 1305  Full code  Continuous       Question:  By:  Answer:  Consent: discussion documented in EHR   06/06/23 1307           Code Status History     Date Active Date Inactive Code Status Order ID Comments User Context   03/15/2023 0145 03/22/2023 1823 Limited: Do not attempt resuscitation (DNR) -DNR-LIMITED -Do Not Intubate/DNI  [id number]  Lee Kingfisher, MD Inpatient   03/14/2023 1656 03/15/2023 0145 Full Code [id number]  Seena Marsa NOVAK, MD ED   12/29/2022 0745 12/31/2022 2114 Full Code [id number]  Jurline Rockie CROME, NP ED   11/30/2022 0126 12/01/2022 1959 Full Code [id number]  Nooruddin, Roetta, MD ED   12/10/2019 0059 12/15/2019 1923 Full Code [id number]  [place], MD ED   12/10/2019 0059 12/10/2019 0059 Full Code [id number]  [place], MD ED   11/24/2014 1527 11/26/2014 2123 Full Code [id number]  Guido Hire, MD Inpatient   08/10/2014 0301 08/11/2014 1701 Full Code [id number]  Odell Balls, PA-C ED           Chief Complaint Uncontrolled type 2 diabetes mellitus with hyperglycemia (HCC) [E11.65]  Level of Care/Admitting Diagnosis ED Disposition     ED Disposition  Admit   Condition  --   Comment  Hospital Area: [facility] [[id number]]  Level of Care: Med-Surg [16]  May place patient in observation at [facility] or [facility] if equivalent level of care is available:: No  Covid Evaluation: Asymptomatic - no recent exposure (last 10 days) testing not required  Diagnosis: Uncontrolled type 2 diabetes mellitus with hyperglycemia [facility]) [[id number]]  Admitting Physician: CELINDA ALM LOT [[id number]]  Attending Physician: CELINDA ALM LOT [[id number]]          Medical History Past Medical History:  Diagnosis Date   Bipolar 1 disorder (HCC)    COPD (chronic  obstructive pulmonary disease) (HCC)    Diabetes mellitus    Fibromyalgia    Hypertension     Allergies Allergies  Allergen Reactions   Nickel Rash    Rash after ear piercing and navel piercings   Oxycodone -Acetaminophen  Nausea And Vomiting   Metformin  Diarrhea   Elemental Sulfur Hives, Rash and Other (See Comments)    Burns the skin, also   Lamictal [Lamotrigine] Rash   Latex Itching and Rash   Sulfa Antibiotics Rash   Sulfur Hives and Rash    Other Reaction(s): Other  Burns the skin, also   Tape Rash and Other (See Comments)    Irritates the skin    IV Location/Drains/Wounds Patient Lines/Drains/Airways Status     Active Line/Drains/Airways     Name Placement date Placement time Site Days   Peripheral IV 06/05/23 20 G 1 Right Antecubital 06/05/23  1633  Antecubital  1            Labs/Imaging Results for orders placed or performed during the hospital encounter of 06/04/23 (from the past 48 hours)  CBG monitoring, ED     Status: Abnormal   Collection Time: 06/05/23  2:02 PM  Result Value Ref Range   Glucose-Capillary 581 (HH) 70 - 99 mg/dL    Comment: Glucose reference range applies only to samples taken after fasting  for at least 8 hours.   Comment 1 Notify RN   POC CBG, ED     Status: Abnormal   Collection Time: 06/05/23  4:09 PM  Result Value Ref Range   Glucose-Capillary >600 (HH) 70 - 99 mg/dL    Comment: Glucose reference range applies only to samples taken after fasting for at least 8 hours.  Basic metabolic panel     Status: Abnormal   Collection Time: 06/05/23  4:34 PM  Result Value Ref Range   Sodium 127 (L) 135 - 145 mmol/L   Potassium 4.9 3.5 - 5.1 mmol/L   Chloride 93 (L) 98 - 111 mmol/L   CO2 25 22 - 32 mmol/L   Glucose, Bld 608 (HH) 70 - 99 mg/dL    Comment: CRITICAL RESULT CALLED TO, READ BACK BY AND VERIFIED WITH SHEPHARD, H @ 1709 ON 06/05/23 CAL Glucose reference range applies only to samples taken after fasting for at least 8  hours.    BUN 22 (H) 6 - 20 mg/dL   Creatinine, Ser 9.41 0.44 - 1.00 mg/dL   Calcium  8.9 8.9 - 10.3 mg/dL   GFR, Estimated >39 >39 mL/min    Comment: (NOTE) Calculated using the CKD-EPI Creatinine Equation (2021)    Anion gap 9 5 - 15    Comment: Performed at [facility], [address], [place], [place] [address]  CBG monitoring, ED     Status: Abnormal   Collection Time: 06/05/23  4:56 PM  Result Value Ref Range   Glucose-Capillary >600 (HH) 70 - 99 mg/dL    Comment: Glucose reference range applies only to samples taken after fasting for at least 8 hours.  CBG monitoring, ED     Status: Abnormal   Collection Time: 06/05/23  6:54 PM  Result Value Ref Range   Glucose-Capillary 430 (H) 70 - 99 mg/dL    Comment: Glucose reference range applies only to samples taken after fasting for at least 8 hours.  CBG monitoring, ED     Status: Abnormal   Collection Time: 06/05/23  8:18 PM  Result Value Ref Range   Glucose-Capillary 415 (H) 70 - 99 mg/dL    Comment: Glucose reference range applies only to samples taken after fasting for at least 8 hours.  CBG monitoring, ED     Status: Abnormal   Collection Time: 06/05/23 10:06 PM  Result Value Ref Range   Glucose-Capillary 276 (H) 70 - 99 mg/dL    Comment: Glucose reference range applies only to samples taken after fasting for at least 8 hours.  CBG monitoring, ED     Status: Abnormal   Collection Time: 06/05/23 11:50 PM  Result Value Ref Range   Glucose-Capillary 60 (L) 70 - 99 mg/dL    Comment: Glucose reference range applies only to samples taken after fasting for at least 8 hours.  CBG monitoring, ED     Status: Abnormal   Collection Time: 06/06/23  1:05 AM  Result Value Ref Range   Glucose-Capillary 124 (H) 70 - 99 mg/dL    Comment: Glucose reference range applies only to samples taken after fasting for at least 8 hours.  CBG monitoring, ED     Status: Abnormal   Collection Time: 06/06/23  3:36 AM  Result Value  Ref Range   Glucose-Capillary 199 (H) 70 - 99 mg/dL    Comment: Glucose reference range applies only to samples taken after fasting for at least 8 hours.  CBG monitoring, ED  Status: Abnormal   Collection Time: 06/06/23  8:43 AM  Result Value Ref Range   Glucose-Capillary 274 (H) 70 - 99 mg/dL    Comment: Glucose reference range applies only to samples taken after fasting for at least 8 hours.  CBG monitoring, ED     Status: Abnormal   Collection Time: 06/06/23 12:30 PM  Result Value Ref Range   Glucose-Capillary 336 (H) 70 - 99 mg/dL    Comment: Glucose reference range applies only to samples taken after fasting for at least 8 hours.   Comment 1 Notify RN   CBG monitoring, ED     Status: Abnormal   Collection Time: 06/06/23  2:15 PM  Result Value Ref Range   Glucose-Capillary 300 (H) 70 - 99 mg/dL    Comment: Glucose reference range applies only to samples taken after fasting for at least 8 hours.  CBG monitoring, ED     Status: Abnormal   Collection Time: 06/06/23  3:40 PM  Result Value Ref Range   Glucose-Capillary 346 (H) 70 - 99 mg/dL    Comment: Glucose reference range applies only to samples taken after fasting for at least 8 hours.  CBG monitoring, ED     Status: Abnormal   Collection Time: 06/06/23  5:22 PM  Result Value Ref Range   Glucose-Capillary 401 (H) 70 - 99 mg/dL    Comment: Glucose reference range applies only to samples taken after fasting for at least 8 hours.   No results found.  Pending Labs Unresulted Labs (From admission, onward)     Start     Ordered   06/07/23 0500  CBC  Tomorrow morning,   R        06/06/23 1307   06/07/23 0500  Comprehensive metabolic panel  Tomorrow morning,   R        06/06/23 1307   06/05/23 1409  Hemoglobin A1c  Once,   URGENT       Comments: To assess prior glycemic control    06/05/23 1408            Vitals/Pain Today's Vitals   06/06/23 1315 06/06/23 1507 06/06/23 1534 06/06/23 1724  BP: 117/89  (!) 160/92  (!) 160/81  Pulse: 78  86 79  Resp: 18  16 16   Temp: 98.4 F (36.9 C)  (!) 97.5 F (36.4 C) 97.8 F (36.6 C)  TempSrc: Oral  Oral Oral  SpO2: 99%  95% 95%  Weight:      Height:      PainSc:  3       Isolation Precautions No active isolations  Medications Medications  insulin  aspart (novoLOG ) injection 0-15 Units (15 Units Subcutaneous Given 06/06/23 1747)  insulin  aspart (novoLOG ) injection 8 Units (8 Units Subcutaneous Given 06/06/23 1748)  umeclidinium bromide  (INCRUSE ELLIPTA ) 62.5 MCG/ACT 1 puff (has no administration in time range)  albuterol  (PROVENTIL ) (2.5 MG/3ML) 0.083% nebulizer solution 2.5 mg (has no administration in time range)  insulin  glargine-yfgn (SEMGLEE ) injection 20 Units (20 Units Subcutaneous Given 06/06/23 1432)  enoxaparin  (LOVENOX ) injection 40 mg (40 mg Subcutaneous Patient Refused/Not Given 06/06/23 1506)  acetaminophen  (TYLENOL ) tablet 650 mg (650 mg Oral Given 06/06/23 1506)    Or  acetaminophen  (TYLENOL ) suppository 650 mg ( Rectal See Alternative 06/06/23 1506)  ondansetron  (ZOFRAN ) tablet 4 mg (has no administration in time range)    Or  ondansetron  (ZOFRAN ) injection 4 mg (has no administration in time range)  0.45 % sodium chloride  infusion ( Intravenous  Not Given 06/06/23 1505)  doxycycline  (VIBRA -TABS) tablet 100 mg (has no administration in time range)  sodium chloride  0.9 % bolus 500 mL (0 mLs Intravenous Stopped 06/04/23 1905)  insulin  aspart (novoLOG ) injection 8 Units (8 Units Subcutaneous Given 06/04/23 1732)  sodium chloride  0.9 % bolus 500 mL (0 mLs Intravenous Stopped 06/04/23 2104)  ziprasidone  (GEODON ) injection 10 mg (10 mg Intramuscular Given 06/05/23 0423)  LORazepam  (ATIVAN ) injection 1 mg (1 mg Intramuscular Given 06/05/23 0423)  sodium chloride  0.9 % bolus 1,000 mL (0 mLs Intravenous Stopped 06/05/23 2136)  insulin  aspart (novoLOG ) injection 10 Units (10 Units Intravenous Given 06/05/23 1713)  dextrose  (GLUTOSE) oral gel 40% (peds > 20kg and adults)  (31 g Oral Given 06/06/23 0009)    Mobility walks

## 2023-06-06 NOTE — ED Notes (Signed)
 Patient moved to room 22 . Report given .   Patient ambulated to room.  No s/s of distress at this time.

## 2023-06-06 NOTE — ED Notes (Signed)
 Writer called floor and was advised to hold off on pt being taken upstairs until shift change due to safety sitter coming in at 19:00

## 2023-06-06 NOTE — ED Notes (Signed)
 Patient c/o HA and "sinus pressure."

## 2023-06-07 DIAGNOSIS — Z5900 Homelessness unspecified: Secondary | ICD-10-CM

## 2023-06-07 DIAGNOSIS — F22 Delusional disorders: Secondary | ICD-10-CM

## 2023-06-07 DIAGNOSIS — F310 Bipolar disorder, current episode hypomanic: Secondary | ICD-10-CM

## 2023-06-07 DIAGNOSIS — E1165 Type 2 diabetes mellitus with hyperglycemia: Secondary | ICD-10-CM | POA: Diagnosis not present

## 2023-06-07 LAB — CBC
HCT: 44.5 % (ref 36.0–46.0)
Hemoglobin: 14.9 g/dL (ref 12.0–15.0)
MCH: 31.9 pg (ref 26.0–34.0)
MCHC: 33.5 g/dL (ref 30.0–36.0)
MCV: 95.3 fL (ref 80.0–100.0)
Platelets: 205 10*3/uL (ref 150–400)
RBC: 4.67 MIL/uL (ref 3.87–5.11)
RDW: 12.6 % (ref 11.5–15.5)
WBC: 7.4 10*3/uL (ref 4.0–10.5)
nRBC: 0 % (ref 0.0–0.2)

## 2023-06-07 LAB — COMPREHENSIVE METABOLIC PANEL
ALT: 14 U/L (ref 0–44)
AST: 13 U/L — ABNORMAL LOW (ref 15–41)
Albumin: 3.3 g/dL — ABNORMAL LOW (ref 3.5–5.0)
Alkaline Phosphatase: 61 U/L (ref 38–126)
Anion gap: 6 (ref 5–15)
BUN: 21 mg/dL — ABNORMAL HIGH (ref 6–20)
CO2: 28 mmol/L (ref 22–32)
Calcium: 8.6 mg/dL — ABNORMAL LOW (ref 8.9–10.3)
Chloride: 100 mmol/L (ref 98–111)
Creatinine, Ser: <0.3 mg/dL — ABNORMAL LOW (ref 0.44–1.00)
Glucose, Bld: 242 mg/dL — ABNORMAL HIGH (ref 70–99)
Potassium: 4 mmol/L (ref 3.5–5.1)
Sodium: 134 mmol/L — ABNORMAL LOW (ref 135–145)
Total Bilirubin: 0.4 mg/dL (ref 0.0–1.2)
Total Protein: 5.7 g/dL — ABNORMAL LOW (ref 6.5–8.1)

## 2023-06-07 LAB — GLUCOSE, CAPILLARY
Glucose-Capillary: 238 mg/dL — ABNORMAL HIGH (ref 70–99)
Glucose-Capillary: 319 mg/dL — ABNORMAL HIGH (ref 70–99)
Glucose-Capillary: 126 mg/dL — ABNORMAL HIGH (ref 70–99)
Glucose-Capillary: 334 mg/dL — ABNORMAL HIGH (ref 70–99)
Glucose-Capillary: 204 mg/dL — ABNORMAL HIGH (ref 70–99)

## 2023-06-07 LAB — HEMOGLOBIN A1C
Hgb A1c MFr Bld: 13.1 % — ABNORMAL HIGH (ref 4.8–5.6)
Mean Plasma Glucose: 329 mg/dL

## 2023-06-07 MED ORDER — CAMPHOR-MENTHOL 0.5-0.5 % EX LOTN
TOPICAL_LOTION | CUTANEOUS | Status: DC | PRN
  Filled 2023-06-07: qty 222

## 2023-06-07 NOTE — Assessment & Plan Note (Addendum)
-   A1c remains elevated and uncontrolled, 13.1% - Patient has been out of insulin due to homelessness and financial constraints but states that typically it is affordable - Continue on current insulin regimen and will adjust as necessary

## 2023-06-07 NOTE — Assessment & Plan Note (Addendum)
-   IVC per psych on 1/4 for paranoia and passive SI - continue sitter for now - follow up further psych eval for plan - again overnight, she displayed further paranoia with thinking the sitter was stealing from her and on the computer too long and was suspicious to her

## 2023-06-07 NOTE — Consult Note (Signed)
 Brief Psychiatry Consult Note  Consulted for Dr. Patsy for continuity on this pt - briefly was seen in ED and recommended to go to inpt psychiatry. Is under IVC and has sitter. Admitted medically largely d/t uncontrolled blood sugars - have reviewed documentation for today - appears pt does not remember conversation re: suicidality. Unfortunately unable to see pt today; had to prioritize new pts. To be seen by inpt psych consult service tomorrow where we will continue to assess need for inpt psychiatry as well as offer medications.   Courtney Bennett A [employer]

## 2023-06-07 NOTE — Progress Notes (Signed)
 Pt requested this nurse to come to her room, made up a reason for the sitter to leave the room, and then passed me a note (from between several Medicare forms) that she had written on a paper towel. The note said that my sitter is stealing from me. I asked pt why she thought that and pt explained that her sitter was on the computer, in her chart a lot and she thought that it was too much and was suspicious. I tried to explain to the pt that the simple explanation is that the hourly charting required for her sitter, and she was probably copying and pasting from her previous assessment. Pt was not satisfied w my answer and moved onto the next subject, which was she wanted to go to a SNF/Rehab facility on discharge. I explained that since she was up ad lib w her sitter and no devices (no walker, etc) that she probably would not qualify. Instructed pt that I would pass along her request to her medical doctor. Sitter remains at bedside

## 2023-06-07 NOTE — Assessment & Plan Note (Addendum)
-   very small left shin boil vs insect bite - given uncontrolled DM, will treat with short course doxy -Neosporin as needed to affected areas as well; multiple small skin scratches

## 2023-06-07 NOTE — Assessment & Plan Note (Signed)
 No signs or symptoms of exacerbation - Last echo 12/29/2022: EF 60 to 65%, grade 1 diastolic dysfunction, mild LVH

## 2023-06-07 NOTE — Assessment & Plan Note (Signed)
-   follow up if any assistance available from [facility]

## 2023-06-07 NOTE — Inpatient Diabetes Management (Addendum)
 Inpatient Diabetes Program Recommendations  AACE/ADA: New Consensus Statement on Inpatient Glycemic Control (2015)  Target Ranges:  Prepandial:   less than 140 mg/dL      Peak postprandial:   less than 180 mg/dL (1-2 hours)      Critically ill patients:  140 - 180 mg/dL   Lab Results  Component Value Date   GLUCAP 126 (H) 06/07/2023   HGBA1C 13.1 (H) 06/05/2023    Latest Reference Range & Units 06/06/23 08:43 06/06/23 12:30 06/06/23 14:15 06/06/23 15:40 06/06/23 17:22 06/06/23 20:27 06/07/23 07:35 06/07/23 11:39  Glucose-Capillary 70 - 99 mg/dL 725 (H) Novolog  16 units total  336 (H) Novolog  8 units 300 (H) 346 (H) 401 (H) Novolog  23 units 238 (H) 319 (H) Novolog  19 units 126 (H)  (H): Data is abnormally high  Diabetes history: DM Outpatient Diabetes medications: Not taken for several weeks: Tresiba  20 units daily and Humalog  8 tid with meals Current orders for Inpatient glycemic control: Semglee  30 units daily, Novolog  8 units tid meal coverage, Novolog  0-15 units tid correction  Inpatient Diabetes Program Recommendations:   Please consider: -Decrease Novolog  correction to 0-9 units tid, 0-5 units hs  Met with patient @ bedside and discussed missed insulin  doses. Discussed importance of taking insulin  to keep out of DKA and be to safe. Patient states she currently lives in her car and does not have a place to keep insulin . Suggested a small cooler may assist with storage and TOC is working with patient for assistance. Patient's car no longer starts so uses only blankets to keep warm. Reviewed resources with patient including shelters but pt. States shelters are not clean and normally has no room.  Thank you, Charlee Whitebread E. Jadarrius Maselli, RN, MSN, CDCES  Diabetes Coordinator Inpatient Glycemic Control Team Team Pager [phone] (8am-5pm) 06/07/2023 1:36 PM   Thank you, Jahmeer Porche E. Kalum Minner, RN, MSN, CDCES  Diabetes Coordinator Inpatient Glycemic Control Team Team Pager [phone]  (8am-5pm) 06/07/2023 1:30 PM

## 2023-06-07 NOTE — Assessment & Plan Note (Signed)
-   Patient denies any current hallucinations and denies being on any medications and states she prefers not to be started on any as well - Follow-up psychiatry evaluation

## 2023-06-07 NOTE — Progress Notes (Signed)
 Progress Note    Courtney Bennett   FMW:[id number]  DOB: [birth year]  DOA: 06/04/2023     0 PCP: Patient, No Pcp Per  Initial CC: paranoia  Hospital Course: Courtney Bennett is a 58 year old female with PMH homelessness, bipolar, diabetes, HTN, fibromyalgia, COPD, chronic diastolic CHF who presented with hallucinations and paranoia.  She had been out of insulin  due to financial constraints and living arrangements.  She had been placed under IVC due to the paranoia and endorsed passive SI stating she wanted to die and had thoughts about slitting her wrist.  She was also having paranoia that men were stalking and trying to rape her. She was recommended for IVC and inpatient hospitalization with psychiatry after stabilizing medically.  Interval History:  No events overnight.  Patient wanting to shower this morning but was willing to talk some.  Seemed to be a little hesitant with some discussion but denies any further suicidal thoughts and states she does not remember the conversation from previously.  She seems to be more appropriately concerned about adequate housing and states that shelters have been very unsanitary and not conducive for someone with uncontrolled diabetes, etc.  She also is unable to store her insulin  due to not having a fridge and states it expires due to this but otherwise she typically can afford it.  Assessment and Plan: * Uncontrolled type 2 diabetes mellitus with hyperglycemia (HCC) - Follow-up pending A1c but prior A1c from 11/30/2022, 12% - Patient has been out of insulin  due to homelessness and financial constraints but states that typically it is affordable - Continue on current insulin  regimen and will adjust as necessary  Paranoid (HCC) - IVC per psych on 1/4 for paranoia and passive SI - continue sitter for now - follow up further psych eval for plan - currently this morning, 1/6 she tells me she doesn't remember saying the things noted on intake and she denies SI  at this time but states she can't afford insulin  and adequate housing which seems to make her anxious and worried in general  Bipolar affective disorder, current episode hypomanic (HCC) - Patient denies any current hallucinations and denies being on any medications and states she prefers not to be started on any as well - Follow-up psychiatry evaluation  Homeless - follow up if any assistance available from [facility]  Insect bites - very small left shin boil vs insect bite - given uncontrolled DM, will treat with short course doxy  Grade I diastolic dysfunction - No signs or symptoms of exacerbation - Last echo 12/29/2022: EF 60 to 65%, grade 1 diastolic dysfunction, mild LVH  Essential hypertension - Continue losartan   Tobacco dependence - Can offer nicotine  patch if needed   Old records reviewed in assessment of this patient  Antimicrobials: Doxycycline  06/06/2023 >> current  DVT prophylaxis:  enoxaparin  (LOVENOX ) injection 40 mg Start: 06/06/23 1400   Code Status:   Code Status: Full Code  Mobility Assessment (Last 72 Hours)     Mobility Assessment     Row Name 06/07/23 0749 06/06/23 2030         Does patient have an order for bedrest or is patient medically unstable No - Continue assessment No - Continue assessment      What is the highest level of mobility based on the progressive mobility assessment? Level 6 (Walks independently in room and hall) - Balance while walking in room without assist - Complete Level 6 (Walks independently in room and hall) - Balance while  walking in room without assist - Complete               Barriers to discharge: u/k Disposition Plan:  u/k HH orders placed:  Status is: Obs  Objective: Blood pressure 137/73, pulse 77, temperature 98.1 F (36.7 C), temperature source Oral, resp. rate 16, height 5' 3 (1.6 m), weight 44 kg, last menstrual period 11/24/2014, SpO2 99%.  Examination:  Physical Exam Constitutional:      Appearance:  Normal appearance.  HENT:     Head: Normocephalic and atraumatic.     Mouth/Throat:     Mouth: Mucous membranes are moist.  Eyes:     Extraocular Movements: Extraocular movements intact.  Cardiovascular:     Rate and Rhythm: Normal rate and regular rhythm.  Pulmonary:     Effort: Pulmonary effort is normal. No respiratory distress.     Breath sounds: Normal breath sounds. No wheezing.  Abdominal:     General: Bowel sounds are normal. There is no distension.     Palpations: Abdomen is soft.     Tenderness: There is no abdominal tenderness.  Musculoskeletal:        General: Normal range of motion.     Cervical back: Normal range of motion and neck supple.  Skin:    General: Skin is warm and dry.     Comments: Small boil noted on left anterior shin.  No erythema, edema, drainage  Neurological:     General: No focal deficit present.     Mental Status: She is alert.  Psychiatric:        Mood and Affect: Mood normal.      Consultants:  Psychiatry   Procedures:    Data Reviewed: Results for orders placed or performed during the hospital encounter of 06/04/23 (from the past 24 hours)  CBG monitoring, ED     Status: Abnormal   Collection Time: 06/06/23 12:30 PM  Result Value Ref Range   Glucose-Capillary 336 (H) 70 - 99 mg/dL   Comment 1 Notify RN   CBG monitoring, ED     Status: Abnormal   Collection Time: 06/06/23  2:15 PM  Result Value Ref Range   Glucose-Capillary 300 (H) 70 - 99 mg/dL  CBG monitoring, ED     Status: Abnormal   Collection Time: 06/06/23  3:40 PM  Result Value Ref Range   Glucose-Capillary 346 (H) 70 - 99 mg/dL  CBG monitoring, ED     Status: Abnormal   Collection Time: 06/06/23  5:22 PM  Result Value Ref Range   Glucose-Capillary 401 (H) 70 - 99 mg/dL  Glucose, capillary     Status: Abnormal   Collection Time: 06/06/23  8:27 PM  Result Value Ref Range   Glucose-Capillary 238 (H) 70 - 99 mg/dL  CBC     Status: None   Collection Time: 06/07/23   4:47 AM  Result Value Ref Range   WBC 7.4 4.0 - 10.5 K/uL   RBC 4.67 3.87 - 5.11 MIL/uL   Hemoglobin 14.9 12.0 - 15.0 g/dL   HCT 55.4 63.9 - 53.9 %   MCV 95.3 80.0 - 100.0 fL   MCH 31.9 26.0 - 34.0 pg   MCHC 33.5 30.0 - 36.0 g/dL   RDW 87.3 88.4 - 84.4 %   Platelets 205 150 - 400 K/uL   nRBC 0.0 0.0 - 0.2 %  Comprehensive metabolic panel     Status: Abnormal   Collection Time: 06/07/23  4:47 AM  Result Value  Ref Range   Sodium 134 (L) 135 - 145 mmol/L   Potassium 4.0 3.5 - 5.1 mmol/L   Chloride 100 98 - 111 mmol/L   CO2 28 22 - 32 mmol/L   Glucose, Bld 242 (H) 70 - 99 mg/dL   BUN 21 (H) 6 - 20 mg/dL   Creatinine, Ser <9.69 (L) 0.44 - 1.00 mg/dL   Calcium  8.6 (L) 8.9 - 10.3 mg/dL   Total Protein 5.7 (L) 6.5 - 8.1 g/dL   Albumin 3.3 (L) 3.5 - 5.0 g/dL   AST 13 (L) 15 - 41 U/L   ALT 14 0 - 44 U/L   Alkaline Phosphatase 61 38 - 126 U/L   Total Bilirubin 0.4 0.0 - 1.2 mg/dL   GFR, Estimated NOT CALCULATED >60 mL/min   Anion gap 6 5 - 15  Glucose, capillary     Status: Abnormal   Collection Time: 06/07/23  7:35 AM  Result Value Ref Range   Glucose-Capillary 319 (H) 70 - 99 mg/dL    I have reviewed pertinent nursing notes, vitals, labs, and images as necessary. I have ordered labwork to follow up on as indicated.  I have reviewed the last notes from staff over past 24 hours. I have discussed patient's care plan and test results with nursing staff, CM/SW, and other staff as appropriate.  Time spent: Greater than 50% of the 55 minute visit was spent in counseling/coordination of care for the patient as laid out in the A&P.   LOS: 0 days   Alm Apo, MD [facility] 06/07/2023, 10:42 AM

## 2023-06-07 NOTE — Hospital Course (Addendum)
 Marland Kitchen

## 2023-06-07 NOTE — Assessment & Plan Note (Signed)
 Continue losartan.

## 2023-06-07 NOTE — Assessment & Plan Note (Signed)
-   Can offer nicotine patch if needed

## 2023-06-07 NOTE — Progress Notes (Signed)
 CBG 238 on arrival to [facility]

## 2023-06-07 NOTE — Care Management Obs Status (Signed)
 MEDICARE OBSERVATION STATUS NOTIFICATION   Patient Details  Name: Courtney Bennett MRN: [id number] Date of Birth: 04/23/1966   Medicare Observation Status Notification Given:  Hart Robinsons, LCSW 06/07/2023, 3:39 PM

## 2023-06-07 NOTE — Plan of Care (Signed)
  Problem: Education: Goal: Ability to describe self-care measures that may prevent or decrease complications (Diabetes Survival Skills Education) will improve 06/07/2023 0002 by Valorie Inocente NOVAK, RN Outcome: Progressing 06/06/2023 2053 by Valorie Inocente NOVAK, RN Outcome: Progressing Goal: Individualized Educational Video(s) 06/07/2023 0002 by Valorie Inocente NOVAK, RN Outcome: Progressing 06/06/2023 2053 by Valorie Inocente NOVAK, RN Outcome: Progressing   Problem: Coping: Goal: Ability to adjust to condition or change in health will improve 06/07/2023 0002 by Valorie Inocente NOVAK, RN Outcome: Progressing 06/06/2023 2053 by Valorie Inocente NOVAK, RN Outcome: Progressing   Problem: Fluid Volume: Goal: Ability to maintain a balanced intake and output will improve 06/07/2023 0002 by Valorie Inocente NOVAK, RN Outcome: Progressing 06/06/2023 2053 by Valorie Inocente NOVAK, RN Outcome: Progressing   Problem: Health Behavior/Discharge Planning: Goal: Ability to identify and utilize available resources and services will improve 06/07/2023 0002 by Valorie Inocente NOVAK, RN Outcome: Progressing 06/06/2023 2053 by Valorie Inocente NOVAK, RN Outcome: Progressing Goal: Ability to manage health-related needs will improve 06/07/2023 0002 by Valorie Inocente NOVAK, RN Outcome: Progressing 06/06/2023 2053 by Valorie Inocente NOVAK, RN Outcome: Progressing   Problem: Metabolic: Goal: Ability to maintain appropriate glucose levels will improve 06/07/2023 0002 by Valorie Inocente NOVAK, RN Outcome: Progressing 06/06/2023 2053 by Valorie Inocente NOVAK, RN Outcome: Progressing   Problem: Nutritional: Goal: Maintenance of adequate nutrition will improve 06/07/2023 0002 by Valorie Inocente NOVAK, RN Outcome: Progressing 06/06/2023 2053 by Valorie Inocente NOVAK, RN Outcome: Progressing Goal: Progress toward achieving an optimal weight will improve 06/07/2023 0002 by Valorie Inocente NOVAK, RN Outcome: Progressing 06/06/2023 2053 by Valorie Inocente NOVAK, RN Outcome: Progressing   Problem: Skin  Integrity: Goal: Risk for impaired skin integrity will decrease 06/07/2023 0002 by Valorie Inocente NOVAK, RN Outcome: Progressing 06/06/2023 2053 by Valorie Inocente NOVAK, RN Outcome: Progressing   Problem: Tissue Perfusion: Goal: Adequacy of tissue perfusion will improve 06/07/2023 0002 by Valorie Inocente NOVAK, RN Outcome: Progressing 06/06/2023 2053 by Valorie Inocente NOVAK, RN Outcome: Progressing   Problem: Education: Goal: Knowledge of General Education information will improve Description: Including pain rating scale, medication(s)/side effects and non-pharmacologic comfort measures Outcome: Progressing   Problem: Health Behavior/Discharge Planning: Goal: Ability to manage health-related needs will improve Outcome: Progressing   Problem: Clinical Measurements: Goal: Ability to maintain clinical measurements within normal limits will improve Outcome: Progressing Goal: Will remain free from infection Outcome: Progressing Goal: Diagnostic test results will improve Outcome: Progressing Goal: Respiratory complications will improve Outcome: Progressing Goal: Cardiovascular complication will be avoided Outcome: Progressing   Problem: Activity: Goal: Risk for activity intolerance will decrease Outcome: Progressing   Problem: Nutrition: Goal: Adequate nutrition will be maintained Outcome: Progressing   Problem: Coping: Goal: Level of anxiety will decrease Outcome: Progressing   Problem: Elimination: Goal: Will not experience complications related to bowel motility Outcome: Progressing Goal: Will not experience complications related to urinary retention Outcome: Progressing   Problem: Pain Management: Goal: General experience of comfort will improve Outcome: Progressing   Problem: Safety: Goal: Ability to remain free from injury will improve Outcome: Progressing   Problem: Skin Integrity: Goal: Risk for impaired skin integrity will decrease Outcome: Progressing

## 2023-06-08 DIAGNOSIS — R45851 Suicidal ideations: Secondary | ICD-10-CM

## 2023-06-08 DIAGNOSIS — R443 Hallucinations, unspecified: Secondary | ICD-10-CM | POA: Diagnosis present

## 2023-06-08 DIAGNOSIS — E11649 Type 2 diabetes mellitus with hypoglycemia without coma: Secondary | ICD-10-CM | POA: Diagnosis not present

## 2023-06-08 DIAGNOSIS — Z91148 Patient's other noncompliance with medication regimen for other reason: Secondary | ICD-10-CM | POA: Diagnosis not present

## 2023-06-08 DIAGNOSIS — Z681 Body mass index (BMI) 19 or less, adult: Secondary | ICD-10-CM | POA: Diagnosis not present

## 2023-06-08 DIAGNOSIS — Z79899 Other long term (current) drug therapy: Secondary | ICD-10-CM | POA: Diagnosis not present

## 2023-06-08 DIAGNOSIS — F310 Bipolar disorder, current episode hypomanic: Secondary | ICD-10-CM | POA: Diagnosis not present

## 2023-06-08 DIAGNOSIS — Z5902 Unsheltered homelessness: Secondary | ICD-10-CM | POA: Diagnosis not present

## 2023-06-08 DIAGNOSIS — Z5900 Homelessness unspecified: Secondary | ICD-10-CM | POA: Diagnosis not present

## 2023-06-08 DIAGNOSIS — M797 Fibromyalgia: Secondary | ICD-10-CM | POA: Diagnosis present

## 2023-06-08 DIAGNOSIS — F17201 Nicotine dependence, unspecified, in remission: Secondary | ICD-10-CM | POA: Diagnosis present

## 2023-06-08 DIAGNOSIS — Z90710 Acquired absence of both cervix and uterus: Secondary | ICD-10-CM | POA: Diagnosis not present

## 2023-06-08 DIAGNOSIS — Z882 Allergy status to sulfonamides status: Secondary | ICD-10-CM | POA: Diagnosis not present

## 2023-06-08 DIAGNOSIS — W57XXXA Bitten or stung by nonvenomous insect and other nonvenomous arthropods, initial encounter: Secondary | ICD-10-CM | POA: Diagnosis present

## 2023-06-08 DIAGNOSIS — F319 Bipolar disorder, unspecified: Secondary | ICD-10-CM | POA: Diagnosis present

## 2023-06-08 DIAGNOSIS — Z91410 Personal history of adult physical and sexual abuse: Secondary | ICD-10-CM | POA: Diagnosis not present

## 2023-06-08 DIAGNOSIS — Z9151 Personal history of suicidal behavior: Secondary | ICD-10-CM | POA: Diagnosis not present

## 2023-06-08 DIAGNOSIS — E1165 Type 2 diabetes mellitus with hyperglycemia: Secondary | ICD-10-CM | POA: Diagnosis present

## 2023-06-08 DIAGNOSIS — F419 Anxiety disorder, unspecified: Secondary | ICD-10-CM | POA: Diagnosis present

## 2023-06-08 DIAGNOSIS — L0292 Furuncle, unspecified: Secondary | ICD-10-CM | POA: Diagnosis present

## 2023-06-08 DIAGNOSIS — J449 Chronic obstructive pulmonary disease, unspecified: Secondary | ICD-10-CM | POA: Diagnosis present

## 2023-06-08 DIAGNOSIS — F22 Delusional disorders: Secondary | ICD-10-CM | POA: Diagnosis not present

## 2023-06-08 DIAGNOSIS — Z794 Long term (current) use of insulin: Secondary | ICD-10-CM | POA: Diagnosis not present

## 2023-06-08 DIAGNOSIS — Z91048 Other nonmedicinal substance allergy status: Secondary | ICD-10-CM | POA: Diagnosis not present

## 2023-06-08 DIAGNOSIS — I5032 Chronic diastolic (congestive) heart failure: Secondary | ICD-10-CM | POA: Diagnosis present

## 2023-06-08 DIAGNOSIS — I110 Hypertensive heart disease with heart failure: Secondary | ICD-10-CM | POA: Diagnosis present

## 2023-06-08 DIAGNOSIS — R636 Underweight: Secondary | ICD-10-CM | POA: Diagnosis present

## 2023-06-08 DIAGNOSIS — Z91040 Latex allergy status: Secondary | ICD-10-CM | POA: Diagnosis not present

## 2023-06-08 LAB — GLUCOSE, CAPILLARY
Glucose-Capillary: 253 mg/dL — ABNORMAL HIGH (ref 70–99)
Glucose-Capillary: 246 mg/dL — ABNORMAL HIGH (ref 70–99)
Glucose-Capillary: 190 mg/dL — ABNORMAL HIGH (ref 70–99)
Glucose-Capillary: 200 mg/dL — ABNORMAL HIGH (ref 70–99)

## 2023-06-08 MED ORDER — KETOCONAZOLE 2 % EX SHAM
MEDICATED_SHAMPOO | CUTANEOUS | Status: DC
  Administered 2023-06-08: 1 via TOPICAL
  Filled 2023-06-08: qty 120

## 2023-06-08 MED ORDER — SELENIUM SULFIDE 1 % EX LOTN: TOPICAL_LOTION | Freq: Every day | CUTANEOUS | Status: DC

## 2023-06-08 MED ORDER — TRIPLE ANTIBIOTIC 3.5-400-5000 EX OINT: TOPICAL_OINTMENT | CUTANEOUS | Status: DC | PRN

## 2023-06-08 MED ORDER — KETOCONAZOLE 2 % EX SHAM
MEDICATED_SHAMPOO | CUTANEOUS | Status: DC
  Filled 2023-06-08: qty 120

## 2023-06-08 MED ORDER — INSULIN GLARGINE-YFGN 100 UNIT/ML ~~LOC~~ SOLN
22.0000 [IU] | Freq: Every day | SUBCUTANEOUS | Status: DC
  Administered 2023-06-09: 22 [IU] via SUBCUTANEOUS
  Filled 2023-06-08: qty 0.22

## 2023-06-08 NOTE — Progress Notes (Signed)
 At around 215pm, noted sign on patient door that read Gone for walk, be back in a minute  MD, Security and [facility] notified.  Patient returned to floor at 244pm with coffee and snacks from cafeteria, informed this patient that she is not allowed to leave the floor without an order from the MD, which she does not have.  Verbalized agreement and understanding

## 2023-06-08 NOTE — Progress Notes (Signed)
 Pt says she is burning up and doesn't feel well, temperature reads 98.1 orally.

## 2023-06-08 NOTE — Consult Note (Addendum)
 [facility] Psychiatric Consult Follow-up  Patient Name: .Courtney Bennett  MRN: [id number]  DOB: 1966-01-02  Consult Order details: PA Beola, SI Ideations and Paranoia  Orders (From admission, onward)     Start     Ordered   06/04/23 1921  CONSULT TO CALL ACT TEAM       Ordering Provider: Beola Terrall RAMAN, PA-C  Provider:  (Not yet assigned)  Question:  Reason for Consult?  Answer:  Psych consult   06/04/23 1923             Mode of Visit: In person    Psychiatry Consult Evaluation  Service Date: June 08, 2023 LOS:  LOS: 0 days  Chief Complaint hallucinations and paranoid  Primary Psychiatric Diagnoses  Bipolar 1 2.   Paranoia   Assessment  Courtney Bennett is a 58 y.o. female admitted: Presented to the ED for 06/04/2023  2:32 PM for hallucinations or paranoia. She carries the psychiatric diagnoses of bipolar disorder and has a past medical history of  hypertension, diabetes mellitus, COPD .   Courtney Bennett, 58 y.o., female patient seen face to face by this provider, consulted with Dr. Larina; and chart reviewed on 06/08/23.  On initial exam she states that her family and friends are worried about her, because she is homeless and has been over the past 1-1/2 years.  Patient denies paranoia but does believe that men have been stalking and trying to rape her.  Patient states that she is not compliant with all of her medications. Patient endorses passive SI, stating that she wants to die and has had thoughts about slitting her own wrist. She was later IVC due to concern for SI statements, plan and increased paranoia.   On follow up assessment, the patient is sitting in a chair bedside and casually groomed in hospital gown. Patient is pleasant and cooperative during interview. She understands that she was IVC'ed due to suicidal statements. Patient denies suicidal intent and believes her high blood glucose altered her thought process. Denies a prior history of SI attempts. Patient reports  that she needs a consistent safe living situation. Her main stressor in her life revolves around homelessness.  She does report some feelings of depressed mood and feelings of hopelessness.  Has any recent manic symptoms such as mood cycling, impulsivity, increased risk behavior, pressured speech, racing thoughts or grandiose ideas. Reports at times she is concerned strangers may attempt to steal her belongings on the street and steal her identity. Patient declined starting medications to help with mood symptoms and declines to go behavioral health hospital voluntarily. Past medication trials haven't worked and she felt like a zombie  After my assessment, the patient does not meet IVC criteria for inpatient hospitalization and we will rescind the IVC. She is denies SI, HI, and AVH. She has some paranoia and delusional thinking. However, she is unwilling to receive medical treatment to improve her mood symptoms, which would be a beneficial component of inpatient hospitalization. Patient has outpatient therapy set up in the [facility] at the [facility]. Provided resources for walk-in hours for [facility].   Please see plan below for detailed recommendations.   Diagnoses:  Active Hospital problems: Principal Problem:   Uncontrolled type 2 diabetes mellitus with hyperglycemia (HCC) Active Problems:   Bipolar affective disorder, current episode hypomanic (HCC)   Paranoid (HCC)   Homeless   Tobacco dependence   Essential hypertension   Insect bites   Grade I diastolic  dysfunction    Plan   ## Psychiatric Medication Recommendations:  - Rescinded involuntary commitment, patient doesn't meet criteria for IVC  - Patient unwilling to receive medication treatment and will not sign voluntarily for [facility] - Patient can continue to follow-up with therapy at the [facility]  - Recommend establishing as a walk-in for [facility] if interested in  starting on medications (information below)  -Recommend safety precautions, standard  ## Medical Decision Making Capacity:  Patient is her own legal guardian  ## Further Work-up:  -- Patient glucose levels are not stable While pt on Qtc prolonging medications, please monitor & replete K+ to 4 and Mg2+ to 2 -- most recent EKG on 06/04/23 had QtC of 434 -- Pertinent labwork reviewed earlier this admission includes: Glucose levels, CMP, BNP   ## Disposition:-- Plan Post Discharge/Psychiatric Care Follow-up resources   -- There is not psychiatric contraindication for discharge   In case of an urgent crisis, you may contact the Mobile Crisis Unit with [facility] at [phone].  [facility] [address], [place], [address] [phone] phone   New Patient Assessment/Therapy Walk-Ins:  Monday and Wednesday: 8 am until slots are full. Every 1st and 2nd Fridays of the month: 1 pm - 5 pm.  NO ASSESSMENT/THERAPY WALK-INS ON TUESDAYS OR THURSDAYS  New Patient Assessment/Medication Management Walk-Ins:  Monday - Friday:  8 am - 11 am.  For all walk-ins, we ask that you arrive by 7:00 am because patients will be seen in the order of arrival.  Availability is limited; therefore, you may not be seen on the same day that you walk-in.  Our goal is to serve and meet the needs of our community to the best of our ability.   ## Behavioral / Environmental: -To minimize splitting of staff, assign one staff person to communicate all information from the team when feasible.    ## Safety and Observation Level:  - Based on my clinical evaluation, I estimate the patient to be at low risk of self harm in the current setting. - At this time, we recommend  routine. This decision is based on my review of the chart including patient's history and current presentation, interview of the patient, mental status examination, and consideration of suicide risk  including evaluating suicidal ideation, plan, intent, suicidal or self-harm behaviors, risk factors, and protective factors. This judgment is based on our ability to directly address suicide risk, implement suicide prevention strategies, and develop a safety plan while the patient is in the clinical setting. Please contact our team if there is a concern that risk level has changed.  CSSR Risk Category:C-SSRS RISK CATEGORY: Low Risk  Suicide Risk Assessment: Patient has following modifiable risk factors for suicide: untreated depression, under treated depression , social isolation, medication noncompliance, and lack of access to outpatient mental health resources, which we are addressing by recommending that patient be admitted into an inpatient psychiatric facility. Patient has following non-modifiable or demographic risk factors for suicide: separation or divorce and psychiatric hospitalization Patient has the following protective factors against suicide: Supportive friends and Cultural, spiritual, or religious beliefs that discourage suicide  Thank you for this consult request. Recommendations have been communicated to the primary team.  We will rescind the IVC for inpatient hospitalization given the patient assessment exam this morning. She can follow-up with outpatient resources. The patient declined inpatient psychiatric treatment and does not meet IVC criteria. We will sign off.  PATTI OLDEN, MD       History of Present Illness  Relevant Aspects of Hospital ED Course:  Admitted on 06/04/2023 for hallucinations and paranoia.   Patient Report:  Courtney Bennett, 58 y.o., female patient seen face to face by this provider, consulted with Dr. Larina; and chart reviewed on 06/05/23.   On follow up assessment, the patient is sitting in a chair bedside and casually groomed in hospital gown. Patient is pleasant and cooperative during interview. She understands that she was IVC'ed due to suicidal  statements. Patient denies suicidal intent and believes her high blood glucose altered her thought process.  Challenges of maintaining her health as well given her homelessness situation.  Reports difficulties maintaining her blood sugar and living on the streets has been difficult. Denies a prior history of SI attempts. Patient reports that she needs a consistent safe living situation. Her main stressor in her life revolves around homelessness 1.5 years.  Patient states that she has been living out of her fleeta, which is not reliable and that he does not work, She does report some feelings of depressed mood and feelings of hopelessness.  Has any recent manic symptoms such as mood cycling, impulsivity, increased risk behavior, pressured speech, racing thoughts or grandiose ideas. Reports at times she is concerned strangers may attempt to steal her belongings on the street and steal her identity. Patient declined starting medications to help with mood symptoms and declines to go behavioral health hospital voluntarily. Past medication trials haven't worked and she felt like a zombie.   Patient hopes to establish at some type of housing either through her church or through a possible [place] [facility]. Source of support is the friend who brought her to the hospital and initiated the IVC.  They have spoken recently and are attempting to mend the relationship.   Psych ROS:  Depression: Patient endorses feelings of hopelessness, low mood  Anxiety:  Patient endorses around living situation Mania (lifetime and current): Patient denies  Psychosis: (lifetime and current): Paranoia and Delusion   Collateral information:  Contacted Joey on 06/05/23, patient friend of 20 yrs, who states he remembers patient when she had a stable job and lived in a house, about 15 to 20 years ago.  He states it appears that patient became more delusional over the years, got evicted years ago from a home and now has an eviction on her record,  which has been making it hard for her to find a place to stay, so he states she resulted to eventually just staying in her fleeta.  He states that he gets about $1100 a month in SSI and receives about $300 a month in food stamps, states he is not sure where her money goes, as she is not accountable of her money.  He states that her mindset has become very negative, as she has a self-defeating attitude.  Said that she was married for about 5 years, 15 years ago and has 2 children, her daughter does not want having things to do with her, and her son is in jail due to burn down their home, and the rest of her family will not talk with her.  He states that he is not sure if he has psychiatric diagnoses of bipolar, he feels that she has delusions of grandeur and paranoia, as she does not like to stay in low-income home, feels that men are always after her and trying to rape or attack her.  He states that she  also tells him that she uses a lot of her money for laundry, as she poops and urinates in her clothing in her fleeta.  He states that she cannot figure out a solution to her own life, and has been very sad to watch her deteriorate after all these years.  Review of Systems  Psychiatric/Behavioral:  Negative for depression and suicidal ideas. The patient is nervous/anxious.      Psychiatric and Social History  Psychiatric History:  Information collected from patient and chart review  Prev Dx/Sx: Bipolar disorder Current Psych Provider: None Home Meds (current): None currently Previous Med Trials: Paxil, Zoloft , Xanax, Geodon , Lamictal, and Atarax she states she did not like the way they made her feel like a zombie, Therapy: None  Prior Psych Hospitalization: Yes Prior Self Harm: Patient denies Prior Violence: No  Family Psych History: Unknown Family Hx suicide: Unknown  Social History:  Educational Hx: Patient states she graduated high school Occupational Hx:: Patient is unemployed [employer] Hx:  Patient denies Living Situation: Patient lives in her [place] Spiritual Hx: Catholic Access to weapons/lethal means: Patient denies  Substance History Alcohol: Patient denies History of alcohol withdrawal seizures patient denies History of DT's patient done Tobacco: Patient has done Illicit drugs: Patient denies Prescription drug abuse: Patient denies Rehab hx: Patient denies  Exam Findings  Physical Exam:  Vital Signs:  Temp:  [98 F (36.7 C)-99 F (37.2 C)] 99 F (37.2 C) (01/06 2138) Pulse Rate:  [80-85] 80 (01/07 0702) Resp:  [16-19] 18 (01/07 0702) BP: (127-156)/(70-84) 143/70 (01/07 0702) SpO2:  [92 %-96 %] 95 % (01/07 0702) Blood pressure (!) 143/70, pulse 80, temperature 99 F (37.2 C), temperature source Oral, resp. rate 18, height 5' 3 (1.6 m), weight 44 kg, last menstrual period 11/24/2014, SpO2 95%. Body mass index is 17.18 kg/m.  Physical Exam Vitals and nursing note reviewed. Exam conducted with a chaperone present.  Neurological:     Mental Status: She is alert.  Psychiatric:        Attention and Perception: Attention normal.        Mood and Affect: Mood is anxious. Mood is not depressed. Affect is tearful.        Speech: Speech normal.        Behavior: Behavior is cooperative.        Thought Content: Thought content is paranoid and delusional. Thought content does not include homicidal or suicidal ideation. Thought content does not include homicidal or suicidal plan.        Judgment: Judgment is not inappropriate.     Mental Status Exam: General Appearance: Casual  Orientation:  Full (Time, Place, and Person)  Memory:  Immediate;   Fair Remote;   Fair  Concentration:  Concentration: Fair and Attention Span: Fair  Recall:  Fair  Attention  Fair  Eye Contact:  Good  Speech:  Clear and Coherent  Language:  Fair  Volume:  Normal  Mood: Okay  Affect: Anxious, Tearful  Thought Process:  Coherent  Thought Content:  Paranoid and Delusional   Suicidal  Thoughts: No   Homicidal Thoughts:  No  Judgement:  Fair  Insight:  Fair  Psychomotor Activity:  Normal  Akathisia:  No  Fund of Knowledge:  Fair      Assets:  Communication Skills Desire for Improvement  Cognition:  WNL  ADL's:  Intact  AIMS (if indicated):        Other History   These have been pulled in through the EMR,  reviewed, and updated if appropriate.  Family History:  The patient's family history includes Breast cancer in her mother and paternal grandmother; Diabetes in her father.  Medical History: Past Medical History:  Diagnosis Date   Bipolar 1 disorder (HCC)    COPD (chronic obstructive pulmonary disease) (HCC)    Diabetes mellitus    Fibromyalgia    Grade I diastolic dysfunction 06/06/2023   Hypertension     Surgical History: Past Surgical History:  Procedure Laterality Date   ABDOMINAL HYSTERECTOMY  01/2019   BREAST BIOPSY Right    BREAST BIOPSY Right 02/18/2023   US  RT BREAST BX W LOC DEV 1ST LESION IMG BX SPEC US  GUIDE 02/18/2023 [facility] MAMMOGRAPHY   CESAREAN SECTION     HERNIA REPAIR       Medications:   Current Facility-Administered Medications:    acetaminophen  (TYLENOL ) tablet 650 mg, 650 mg, Oral, Q6H PRN, 650 mg at 06/07/23 2225 **OR** acetaminophen  (TYLENOL ) suppository 650 mg, 650 mg, Rectal, Q6H PRN, Celinda Alm Lot, MD   albuterol  (PROVENTIL ) (2.5 MG/3ML) 0.083% nebulizer solution 2.5 mg, 2.5 mg, Nebulization, Q6H PRN, Haviland, Julie, MD   camphor-menthol  VIKKI) lotion, , Topical, PRN, Daniels, James K, NP   doxycycline  (VIBRA -TABS) tablet 100 mg, 100 mg, Oral, Q12H, Celinda Alm Lot, MD, 100 mg at 06/07/23 2117   enoxaparin  (LOVENOX ) injection 40 mg, 40 mg, Subcutaneous, Q24H, Celinda Alm Lot, MD, 40 mg at 06/07/23 1412   insulin  aspart (novoLOG ) injection 0-15 Units, 0-15 Units, Subcutaneous, TID WC, Haviland, Julie, MD, 11 Units at 06/07/23 1804   insulin  aspart (novoLOG ) injection 8 Units, 8 Units, Subcutaneous, TID WC,  Haviland, Julie, MD, 8 Units at 06/07/23 1804   insulin  glargine-yfgn (SEMGLEE ) injection 20 Units, 20 Units, Subcutaneous, Daily, Celinda Alm Lot, MD, 20 Units at 06/07/23 1022   losartan  (COZAAR ) tablet 50 mg, 50 mg, Oral, Daily, Celinda Alm Lot, MD, 50 mg at 06/07/23 1022   ondansetron  (ZOFRAN ) tablet 4 mg, 4 mg, Oral, Q6H PRN **OR** ondansetron  (ZOFRAN ) injection 4 mg, 4 mg, Intravenous, Q6H PRN, Celinda Alm Lot, MD   umeclidinium bromide  (INCRUSE ELLIPTA ) 62.5 MCG/ACT 1 puff, 1 puff, Inhalation, Daily PRN, Haviland, Julie, MD  Allergies: Allergies  Allergen Reactions   Nickel Rash    Rash after ear piercing and navel piercings   Oxycodone -Acetaminophen  Nausea And Vomiting   Metformin  Diarrhea   Elemental Sulfur Hives, Rash and Other (See Comments)    Burns the skin, also   Lamictal [Lamotrigine] Rash   Latex Itching and Rash   Sulfa Antibiotics Rash   Sulfur Hives and Rash    Other Reaction(s): Other  Burns the skin, also   Tape Rash and Other (See Comments)    Irritates the skin    PATTI OLDEN, MD PGY-1

## 2023-06-08 NOTE — Plan of Care (Signed)
   Problem: Coping: Goal: Ability to adjust to condition or change in health will improve Outcome: Progressing

## 2023-06-08 NOTE — Progress Notes (Signed)
 Progress Note    Courtney Bennett   FMW:[id number]  DOB: July 08, 1965  DOA: 06/04/2023     0 PCP: Patient, No Pcp Per  Initial CC: paranoia  Hospital Course: [name] is a 58 year old female with PMH homelessness, bipolar, diabetes, HTN, fibromyalgia, COPD, chronic diastolic CHF who presented with hallucinations and paranoia.  She had been out of insulin  due to financial constraints and living arrangements.  She had been placed under IVC due to the paranoia and endorsed passive SI stating she wanted to die and had thoughts about slitting her wrist.  She was also having paranoia that men were stalking and trying to rape her. She was recommended for IVC and inpatient hospitalization with psychiatry after stabilizing medically.  Interval History:  Overnight she again displayed further paranoia thinking sitter was stealing from her and suspicious on the computer.  Psychiatry evaluating this morning.  Assessment and Plan: * Uncontrolled type 2 diabetes mellitus with hyperglycemia (HCC) - A1c remains elevated and uncontrolled, 13.1% - Patient has been out of insulin  due to homelessness and financial constraints but states that typically it is affordable - Continue on current insulin  regimen and will adjust as necessary  Paranoid (HCC) - IVC per psych on 1/4 for paranoia and passive SI - continue sitter for now - follow up further psych eval for plan - again overnight, she displayed further paranoia with thinking the sitter was stealing from her and on the computer too long and was suspicious to her   Bipolar affective disorder, current episode hypomanic (HCC) - Patient denies any current hallucinations and denies being on any medications and states she prefers not to be started on any as well - Follow-up psychiatry evaluation  Homeless - follow up if any assistance available from [facility]  Insect bites - very small left shin boil vs insect bite - given uncontrolled DM, will treat with  short course doxy -Neosporin as needed to affected areas as well; multiple small skin scratches   Grade I diastolic dysfunction - No signs or symptoms of exacerbation - Last echo 12/29/2022: EF 60 to 65%, grade 1 diastolic dysfunction, mild LVH  Essential hypertension - Continue losartan   Tobacco dependence - Can offer nicotine  patch if needed   Old records reviewed in assessment of this patient  Antimicrobials: Doxycycline  06/06/2023 >> current  DVT prophylaxis:  enoxaparin  (LOVENOX ) injection 40 mg Start: 06/06/23 1400   Code Status:   Code Status: Full Code  Mobility Assessment (Last 72 Hours)     Mobility Assessment     Row Name 06/08/23 0800 06/08/23 0752 06/07/23 2117 06/07/23 0749 06/06/23 2030   Does patient have an order for bedrest or is patient medically unstable No - Continue assessment No - Continue assessment No - Continue assessment No - Continue assessment No - Continue assessment   What is the highest level of mobility based on the progressive mobility assessment? Level 6 (Walks independently in room and hall) - Balance while walking in room without assist - Complete Level 6 (Walks independently in room and hall) - Balance while walking in room without assist - Complete Level 6 (Walks independently in room and hall) - Balance while walking in room without assist - Complete Level 6 (Walks independently in room and hall) - Balance while walking in room without assist - Complete Level 6 (Walks independently in room and hall) - Balance while walking in room without assist - Complete            Barriers to  discharge: u/k Disposition Plan:  u/k HH orders placed:  Status is: Obs  Objective: Blood pressure (!) 143/70, pulse 80, temperature 99 F (37.2 C), temperature source Oral, resp. rate 18, height 5' 3 (1.6 m), weight 44 kg, last menstrual period 11/24/2014, SpO2 95%.  Examination:  Physical Exam Constitutional:      Appearance: Normal appearance.  HENT:      Head: Normocephalic and atraumatic.     Comments: Dandruff appreciated in scalp with some minor scalp abrasions from scratching    Mouth/Throat:     Mouth: Mucous membranes are moist.  Eyes:     Extraocular Movements: Extraocular movements intact.  Cardiovascular:     Rate and Rhythm: Normal rate and regular rhythm.  Pulmonary:     Effort: Pulmonary effort is normal. No respiratory distress.     Breath sounds: Normal breath sounds. No wheezing.  Abdominal:     General: Bowel sounds are normal. There is no distension.     Palpations: Abdomen is soft.     Tenderness: There is no abdominal tenderness.  Musculoskeletal:        General: Normal range of motion.     Cervical back: Normal range of motion and neck supple.  Skin:    General: Skin is warm and dry.     Comments: Small boil noted on left anterior shin.  No erythema, edema, drainage; other small scattered skin scratches appreciated throughout  Neurological:     General: No focal deficit present.     Mental Status: She is alert.  Psychiatric:        Mood and Affect: Mood normal.      Consultants:  Psychiatry   Procedures:    Data Reviewed: Results for orders placed or performed during the hospital encounter of 06/04/23 (from the past 24 hours)  Glucose, capillary     Status: Abnormal   Collection Time: 06/07/23 11:39 AM  Result Value Ref Range   Glucose-Capillary 126 (H) 70 - 99 mg/dL  Glucose, capillary     Status: Abnormal   Collection Time: 06/07/23  5:56 PM  Result Value Ref Range   Glucose-Capillary 334 (H) 70 - 99 mg/dL  Glucose, capillary     Status: Abnormal   Collection Time: 06/07/23  9:34 PM  Result Value Ref Range   Glucose-Capillary 204 (H) 70 - 99 mg/dL  Glucose, capillary     Status: Abnormal   Collection Time: 06/08/23  8:07 AM  Result Value Ref Range   Glucose-Capillary 253 (H) 70 - 99 mg/dL    I have reviewed pertinent nursing notes, vitals, labs, and images as necessary. I have ordered  labwork to follow up on as indicated.  I have reviewed the last notes from staff over past 24 hours. I have discussed patient's care plan and test results with nursing staff, CM/SW, and other staff as appropriate.  Time spent: Greater than 50% of the 55 minute visit was spent in counseling/coordination of care for the patient as laid out in the A&P.   LOS: 0 days   Alm Apo, MD [facility] 06/08/2023, 9:59 AM

## 2023-06-09 ENCOUNTER — Other Ambulatory Visit (HOSPITAL_COMMUNITY): Payer: Self-pay

## 2023-06-09 DIAGNOSIS — R45851 Suicidal ideations: Principal | ICD-10-CM

## 2023-06-09 DIAGNOSIS — E1165 Type 2 diabetes mellitus with hyperglycemia: Secondary | ICD-10-CM | POA: Diagnosis not present

## 2023-06-09 LAB — GLUCOSE, CAPILLARY
Glucose-Capillary: 253 mg/dL — ABNORMAL HIGH (ref 70–99)
Glucose-Capillary: 169 mg/dL — ABNORMAL HIGH (ref 70–99)

## 2023-06-09 MED ORDER — TRIPLE ANTIBIOTIC 3.5-400-5000 EX OINT
1.0000 | TOPICAL_OINTMENT | CUTANEOUS | 0 refills | Status: AC | PRN
  Filled 2023-06-09: qty 28.4, 5d supply, fill #0

## 2023-06-09 MED ORDER — ALBUTEROL SULFATE HFA 108 (90 BASE) MCG/ACT IN AERS
1.0000 | INHALATION_SPRAY | Freq: Four times a day (QID) | RESPIRATORY_TRACT | 0 refills | Status: AC | PRN
  Filled 2023-06-09: qty 18, 25d supply, fill #0

## 2023-06-09 MED ORDER — INSULIN DEGLUDEC 100 UNIT/ML ~~LOC~~ SOPN
22.0000 [IU] | PEN_INJECTOR | Freq: Every day | SUBCUTANEOUS | 2 refills | Status: DC
  Filled 2023-06-09: qty 6, 27d supply, fill #0

## 2023-06-09 MED ORDER — KETOCONAZOLE 2 % EX SHAM
1.0000 | MEDICATED_SHAMPOO | CUTANEOUS | 0 refills | Status: DC
  Filled 2023-06-09: qty 120, 30d supply, fill #0

## 2023-06-09 MED ORDER — ESTROGENS CONJUGATED 0.3 MG PO TABS
0.3000 mg | ORAL_TABLET | Freq: Every day | ORAL | 2 refills | Status: DC
  Filled 2023-06-09: qty 30, 30d supply, fill #0

## 2023-06-09 MED ORDER — ASPIRIN 81 MG PO TBEC
81.0000 mg | DELAYED_RELEASE_TABLET | Freq: Every day | ORAL | 2 refills | Status: DC
  Filled 2023-06-09: qty 30, 30d supply, fill #0

## 2023-06-09 MED ORDER — DOXYCYCLINE HYCLATE 100 MG PO TABS
100.0000 mg | ORAL_TABLET | Freq: Two times a day (BID) | ORAL | 0 refills | Status: AC
  Filled 2023-06-09: qty 6, 3d supply, fill #0

## 2023-06-09 MED ORDER — HUMALOG KWIKPEN 200 UNIT/ML ~~LOC~~ SOPN
8.0000 [IU] | PEN_INJECTOR | Freq: Three times a day (TID) | SUBCUTANEOUS | 2 refills | Status: DC
  Filled 2023-06-09: qty 15, 30d supply, fill #0
  Filled 2023-06-09: qty 9, 36d supply, fill #0

## 2023-06-09 MED ORDER — GABAPENTIN 100 MG PO CAPS
100.0000 mg | ORAL_CAPSULE | Freq: Two times a day (BID) | ORAL | 1 refills | Status: DC
  Filled 2023-06-09: qty 60, 30d supply, fill #0

## 2023-06-09 MED ORDER — NITROGLYCERIN 0.4 MG SL SUBL
0.4000 mg | SUBLINGUAL_TABLET | SUBLINGUAL | 0 refills | Status: AC | PRN
  Filled 2023-06-09: qty 25, 1d supply, fill #0

## 2023-06-09 MED ORDER — LOSARTAN POTASSIUM 50 MG PO TABS
50.0000 mg | ORAL_TABLET | Freq: Every day | ORAL | 1 refills | Status: DC
  Filled 2023-06-09: qty 30, 30d supply, fill #0

## 2023-06-09 MED ORDER — AMLODIPINE BESYLATE 10 MG PO TABS
10.0000 mg | ORAL_TABLET | Freq: Every day | ORAL | 1 refills | Status: DC
  Filled 2023-06-09 (×3): qty 30, 30d supply, fill #0

## 2023-06-09 NOTE — Progress Notes (Signed)
 TOC meds in a secure bag delivered to bedside - given to patient along with her bus pass

## 2023-06-09 NOTE — Discharge Summary (Signed)
 Physician Discharge Summary  Courtney Bennett FMW:[id number] DOB: [birth year] DOA: 06/04/2023  PCP: Courtney Bennett, No Pcp Per  Admit date: 06/04/2023 Discharge date: 06/09/2023  Admitted From: Home  Discharge disposition: Home   Recommendations for Outpatient Follow-Up:   Follow up with your primary care provider in one week.  Check CBC, BMP, magnesium  in the next visit Follow-up with therapy as outpatient and with [facility] for medication treatment if desired.   Discharge Diagnosis:   Principal Problem:   Uncontrolled type 2 diabetes mellitus with hyperglycemia (HCC) Active Problems:   Bipolar affective disorder, current episode hypomanic (HCC)   Paranoid (HCC)   Homeless   Insect bites   Tobacco dependence   Essential hypertension   Grade I diastolic dysfunction   Suicidal ideation   Discharge Condition: Improved.  Diet recommendation: Diabetic  Wound care: None.  Code status: Full.   History of Present Illness:   [name] is a 58 year old female with past medical history of homelessness, bipolar disorder, diabetes, HTN, fibromyalgia, COPD, chronic diastolic CHF presented to hospital with hallucinations and paranoia.  She had been out of insulin  due to financial constraints and living arrangements.  She had initially been placed under IVC due to the paranoia and endorsed passive suicidal ideation with paranoia.  Subsequently, Courtney Bennett was considered to be stable by psychiatry and IVC was rescinded   Hospital Course:   Following conditions were addressed during hospitalization as listed below,  * Uncontrolled type 2 diabetes mellitus with hyperglycemia  Latest hemoglobin A1c of 13.1.  Out of insulin  due to homelessness.  Courtney Bennett received insulin  regimen while in hospital.  Will prescribe long-acting and sliding scale insulin  on discharge.    Paranoia/Bipolar affective disorder, current episode hypomanic   IVC per psych on 1/4 for paranoia and  passive SI, and required a sitter.  Currently IVC rescinded. As per psychiatry Courtney Bennett is unwilling to receive medication treatment.  Psychiatry recommend walking [facility] behavioral health follow-up if interested.   Homeless TOC on board.  TOC assistance on discharge.   Insect bites With some infection so on doxycycline . Neosporin as needed to affected areas as well; multiple small skin scratches.  Will continue on discharge.   Grade I diastolic dysfunction Compensated.  Last 2D echo 12/29/2022: EF 60 to 65%, grade 1 diastolic dysfunction, mild LVH   Essential hypertension Continue losartan .   Tobacco dependence Counseled reported.   Disposition.  At this time, Courtney Bennett is stable for disposition home with outpatient PCP and behavioral health follow-up.  Medical Consultants:   Psychiatry  Procedures:    None Subjective:   Today, Courtney Bennett was seen and examined at bedside.  Denies any dizziness, lightheadedness, shortness of breath, chest pain or palpitation.  No fever or chills.  Discharge Exam:   Vitals:   06/08/23 2121 06/09/23 0647  BP: 134/80 (!) 152/85  Pulse: 83 74  Resp: 18 18  Temp: 97.6 F (36.4 C) 98 F (36.7 C)  SpO2: 97% 96%   Vitals:   06/08/23 1446 06/08/23 1841 06/08/23 2121 06/09/23 0647  BP: (!) 152/75 (!) 154/80 134/80 (!) 152/85  Pulse: (!) 106 88 83 74  Resp: 16 16 18 18   Temp: 98.2 F (36.8 C) 98.2 F (36.8 C) 97.6 F (36.4 C) 98 F (36.7 C)  TempSrc: Oral Oral Oral Oral  SpO2: 96% 96% 97% 96%  Weight:      Height:       Body mass index is 17.18 kg/m.   General: Alert  awake, not in obvious distress thinly built.  Scalp abrasions. HENT: pupils equally reacting to light,  No scleral pallor or icterus noted. Oral mucosa is moist.  Chest:  Diminished breath sounds bilaterally. No crackles or wheezes.  CVS: S1 &S2 heard. No murmur.  Regular rate and rhythm. Abdomen: Soft, nontender, nondistended.  Bowel sounds are heard.    Extremities: No cyanosis, clubbing or edema.  Peripheral pulses are palpable.  Left anterior shin with small boil. Psych: Alert, awake and oriented, normal mood CNS:  No cranial nerve deficits.  Power equal in all extremities.   Skin: Warm and dry.  No rashes noted.  The results of significant diagnostics from this hospitalization (including imaging, microbiology, ancillary and laboratory) are listed below for reference.     Diagnostic Studies:   No results found.   Labs:   Basic Metabolic Panel: Recent Labs  Lab 06/04/23 1726 06/04/23 1804 06/05/23 1634 06/07/23 0447  NA 130* 132* 127* 134*  K 4.7 4.8 4.9 4.0  CL 97* 95* 93* 100  CO2 25  --  25 28  GLUCOSE 382* 403* 608* 242*  BUN 25* 30* 22* 21*  CREATININE 0.46 0.40* 0.58 <0.30*  CALCIUM  8.8*  --  8.9 8.6*   GFR CrCl cannot be calculated (This lab value cannot be used to calculate CrCl because it is not a number: <0.30). Liver Function Tests: Recent Labs  Lab 06/04/23 1726 06/07/23 0447  AST 16 13*  ALT 11 14  ALKPHOS 77 61  BILITOT 0.9 0.4  PROT 6.5 5.7*  ALBUMIN 3.7 3.3*   No results for input(s): LIPASE, AMYLASE in the last 168 hours. No results for input(s): AMMONIA in the last 168 hours. Coagulation profile No results for input(s): INR, PROTIME in the last 168 hours.  CBC: Recent Labs  Lab 06/04/23 1726 06/04/23 1804 06/07/23 0447  WBC 10.4  --  7.4  NEUTROABS 7.5  --   --   HGB 17.3* 17.3* 14.9  HCT 51.4* 51.0* 44.5  MCV 94.1  --  95.3  PLT 245  --  205   Cardiac Enzymes: No results for input(s): CKTOTAL, CKMB, CKMBINDEX, TROPONINI in the last 168 hours. BNP: Invalid input(s): POCBNP CBG: Recent Labs  Lab 06/08/23 1139 06/08/23 1626 06/08/23 2119 06/09/23 0743 06/09/23 1123  GLUCAP 246* 190* 200* 253* 169*   D-Dimer No results for input(s): DDIMER in the last 72 hours. Hgb A1c No results for input(s): HGBA1C in the last 72 hours. Lipid Profile No  results for input(s): CHOL, HDL, LDLCALC, TRIG, CHOLHDL, LDLDIRECT in the last 72 hours. Thyroid  function studies No results for input(s): TSH, T4TOTAL, T3FREE, THYROIDAB in the last 72 hours.  Invalid input(s): FREET3 Anemia work up No results for input(s): VITAMINB12, FOLATE, FERRITIN, TIBC, IRON, RETICCTPCT in the last 72 hours. Microbiology No results found for this or any previous visit (from the past 240 hours).   Discharge Instructions:   Discharge Instructions     Call MD for:  temperature >100.4   Complete by: As directed    Diet general   Complete by: As directed    Discharge instructions   Complete by: As directed    Follow-up with your primary care provider in 1 week.  Check blood work at that time.  Seek medical attention for worsening symptoms.  Follow-up with your therapist as outpatient at [facility] therapeutic services.  Recommend follow-up at [facility] behavioral health to start medications if desired.   Increase activity slowly  Complete by: As directed       Allergies as of 06/09/2023       Reactions   Nickel Rash   Rash after ear piercing and navel piercings   Oxycodone -acetaminophen  Nausea And Vomiting   Metformin  Diarrhea   Elemental Sulfur Hives, Rash, Other (See Comments)   Burns the skin, also   Lamictal [lamotrigine] Rash   Latex Itching, Rash   Sulfa Antibiotics Rash   Sulfur Hives, Rash   Other Reaction(s): Other Burns the skin, also   Tape Rash, Other (See Comments)   Irritates the skin        Medication List     TAKE these medications    acetaminophen  500 MG tablet Commonly known as: TYLENOL  Take 1,000 mg by mouth every 6 (six) hours as needed for headache or moderate pain (pain score 4-6).   amLODipine  10 MG tablet Commonly known as: NORVASC  Take 1 tablet (10 mg total) by mouth daily.   APPLE CIDER VINEGAR PO Take 1 tablet by mouth daily.   aspirin  EC 81 MG tablet Take 1 tablet (81  mg total) by mouth daily. Swallow whole.   CALCIUM -VITAMIN D PO Take 2 tablets by mouth daily.   CENTRUM ADULT PO Take 1 tablet by mouth daily.   CRANBERRY PO Take 2 tablets by mouth daily.   doxycycline  100 MG tablet Commonly known as: VIBRA -TABS Take 1 tablet (100 mg total) by mouth every 12 (twelve) hours for 3 days.   FreeStyle Libre 14 Day Sensor Misc 1 application by Subdermal route See admin instructions. Check blood glucose 1-2 times daily. E11.65   gabapentin  100 MG capsule Commonly known as: NEURONTIN  Take 1 capsule (100 mg total) by mouth 2 (two) times daily.   HumaLOG  KwikPen 200 UNIT/ML KwikPen Generic drug: insulin  lispro Inject 8 Units into the skin 3 (three) times daily with meals. 8 units 3 times per day before meals + correctional insulin  MDD 50 units per day Subcutaneous for 30 days   Insulin  Pen Needle 32G X 4 MM Misc Use as directed with insulin  pens   ketoconazole  2 % shampoo Commonly known as: NIZORAL  Apply 1 Application topically 2 (two) times a week. Start taking on: June 10, 2023   losartan  50 MG tablet Commonly known as: COZAAR  Take 1 tablet (50 mg total) by mouth daily.   naproxen sodium 220 MG tablet Commonly known as: ALEVE Take 440 mg by mouth daily as needed (pain).   neomycin -bacitracin -polymyxin [phone] Oint Apply 1 Application topically as needed for up to 5 days.   nitroGLYCERIN  0.4 MG SL tablet Commonly known as: NITROSTAT  Place 1 tablet (0.4 mg total) under the tongue every 5 (five) minutes as needed for chest pain.   Premarin  0.3 MG tablet Generic drug: estrogens  (conjugated) Take 1 tablet (0.3 mg total) by mouth daily.   Tresiba  FlexTouch 100 UNIT/ML FlexTouch Pen Generic drug: insulin  degludec Inject 22 Units into the skin daily. What changed: how much to take   umeclidinium bromide  62.5 MCG/ACT Aepb Commonly known as: INCRUSE ELLIPTA  Inhale 1 puff into the lungs daily. What changed:  when to take  this reasons to take this   Ventolin  HFA 108 (90 Base) MCG/ACT inhaler Generic drug: albuterol  Inhale 1-2 puffs into the lungs every 6 (six) hours as needed. What changed: reasons to take this        Follow-up Information     [facility]. Go to.   Specialty: Urgent Care Why: For medication management  you can report to the following location for walk in hours:   [facility] [address]. [place], [place], [address] [phone] phone   New Courtney Bennett Assessment/Therapy Walk-Ins:  Monday and Wednesday: 8 am until slots are full. Every 1st and 2nd Fridays of the month: 1 pm - 5 pm.  NO ASSESSMENT/THERAPY WALK-INS ON TUESDAYS OR THURSDAYS  New Courtney Bennett Assessment/Medication Management Walk-Ins:  Monday - Friday:  8 am - 11 am.  For all walk-ins, we ask that you arrive by 7:00 am because patients will be seen in the order of arrival.  Availability is limited; therefore, you may not be seen on the same day that you walk-in.  Our goal is to serve and meet the needs of our community to the best of our ability., As needed  In case of an urgent crisis, you may contact the Mobile Crisis Unit with [facility] at [phone]. Contact information: [address] [place]  [address] [phone]        [facility] Follow up.   Why: As needed Contact information: [address] [place] [address] [phone]         [facility] Follow up.   Contact information: Health And Safety Inspector Location: [address] Hours: 8 p.m. to 8 a.m. on Tuesday, Jan. 7  [facility]  Location: [address] Hours: 8 p.m. to 6:30 a.m. on Tuesday, Jan. 7 Open for: Adults with children                 Time coordinating discharge: 39 minutes  Signed:  Neka Bise  [facility] 06/09/2023, 2:25 PM

## 2023-06-09 NOTE — Progress Notes (Signed)
 Patient laying in bed tearful for fear of being DC'd today. Pt is eating graham crackers but has not ordered breakfast yet, therefore I will hold coverage until she has her tray. I made several low carb suggestions as to what she could eat for breakfast. Patient seemed frustrated by my suggestions and tossed the menu to the side.

## 2023-06-09 NOTE — TOC Transition Note (Addendum)
 Transition of Care [facility]) - Discharge Note   Patient Details  Name: Courtney Bennett MRN: [id number] Date of Birth: 21-Sep-1965  Transition of Care [facility]) CM/SW Contact:  NORMAN ASPEN, LCSW Phone Number: 06/09/2023, 11:21 AM   Clinical Narrative:     Met with pt yesterday and today in preparation that she will be medically cleared for dc today.  Pt tearful at times and worried about dc with current temperatures and no heat in her [place] where she has been living.  Reviewed IRC and local shelters which she is familiar with but does not want to go to them stating, they aren't good places.  She asks that I contact a community support person who she was talking with upon this hospital admission and says that she was trying to help me get hooked up with a church where I can stay during the winter.  Gave me name/ #  of [place] - have left 2 VMs for Bobetta Babe with Behavioral Response team (@ [phone]) requesting a call back and alerting her that pt will dc today.  At this time, pt is medically ready for dc.  She is completely independent with all mobility and has been provided shoes from hospital clothing closet per her request.  Will provide her with bus pass to get to [facility] if she chooses.  She has contact # for Bobetta Babe to continue to try and reach her to check status of any additional resources.  Have provided her with information on the current warming centers which she can access if she chooses.   Of note, she confirms that she has received medical primary care at [facility] and will follow up if needed.    No further TOC needs.  ADDENDUM:   Alerted by RN that pt asking for clothes because she threw away the clothes that were in the room because they were too big.  Have provided her with a sweater, sweatshirt, vest and pants.  Have, also, provided her with [facility] # per pt request (but pt had given ME the number yesterday.).  Final next level of  care: Homeless Shelter Barriers to Discharge: Barriers Resolved   Patient Goals and CMS Choice Patient states their goals for this hospitalization and ongoing recovery are:: to find temporary housing          Discharge Placement                       Discharge Plan and Services Additional resources added to the After Visit Summary for                  DME Arranged: N/A DME Agency: NA                  Social Drivers of Health (SDOH) Interventions SDOH Screenings   Food Insecurity: Food Insecurity Present (06/06/2023)  Housing: High Risk (06/06/2023)  Transportation Needs: Unmet Transportation Needs (06/06/2023)  Utilities: Not At Risk (06/06/2023)  Depression (PHQ2-9): Medium Risk (06/16/2019)  Financial Resource Strain: Not at Risk (05/13/2023)   Received from [facility]  Physical Activity: Not on File (04/01/2023)   Received from [facility]  Social Connections: Moderately Integrated (06/06/2023)  Stress: Not on File (04/01/2023)   Received from [facility]  Tobacco Use: Medium Risk (06/05/2023)     Readmission Risk Interventions    03/17/2023    1:13 PM  Readmission Risk Prevention Plan  Transportation Screening Complete  PCP or Specialist  Appt within 5-7 Days Complete  Home Care Screening Complete  Medication Review (RN CM) Complete

## 2023-06-09 NOTE — Progress Notes (Signed)
 AVS reviewed w/ pt who verbalized an understanding. PIV was removed by primary RN. Pt's insulins were filled on 06/02/23 and will not be available for refill for 3 more days. Amlodipine  was filled on 05/29/23 for 90 days- pt states she will check her [place] to see if meds are in the [place]. This RN reassured pt that she could take the doxycycline  even if she was in the sun. Pt has clothes for d/c and location of a warming station for tonight or to the 24 hour Urgent care for behavioral health  if she wanted to f/u  with behavioral health advocate

## 2023-06-09 NOTE — Progress Notes (Signed)
 W/C refused by patient. Pt dressed and ambulated to lobby by self.

## 2023-06-09 NOTE — Progress Notes (Signed)
 Overnight note:  Patient anxious and restless at times, walking hall to help.   Patient overnight explains that she doesn't feel well and hurting, asked her about pain. She explains mild mouth pain. Gave tylenol  around midnight.   Patient refusing vital signs to be taken for 6AM rounds.

## 2023-06-23 ENCOUNTER — Other Ambulatory Visit (HOSPITAL_BASED_OUTPATIENT_CLINIC_OR_DEPARTMENT_OTHER): Payer: Self-pay

## 2023-07-06 ENCOUNTER — Other Ambulatory Visit: Payer: Self-pay

## 2023-07-06 ENCOUNTER — Inpatient Hospital Stay (HOSPITAL_COMMUNITY)
Admission: EM | Admit: 2023-07-06 | Discharge: 2023-07-21 | DRG: 853 | Disposition: A | Discharge status: Skilled Nursing Facility | Payer: 59 | Attending: Student in an Organized Health Care Education/Training Program | Admitting: Student in an Organized Health Care Education/Training Program

## 2023-07-06 ENCOUNTER — Emergency Department (HOSPITAL_COMMUNITY): Payer: 59

## 2023-07-06 DIAGNOSIS — Z681 Body mass index (BMI) 19 or less, adult: Secondary | ICD-10-CM

## 2023-07-06 DIAGNOSIS — E109 Type 1 diabetes mellitus without complications: Secondary | ICD-10-CM | POA: Diagnosis present

## 2023-07-06 DIAGNOSIS — Z7982 Long term (current) use of aspirin: Secondary | ICD-10-CM

## 2023-07-06 DIAGNOSIS — R9431 Abnormal electrocardiogram [ECG] [EKG]: Secondary | ICD-10-CM | POA: Diagnosis not present

## 2023-07-06 DIAGNOSIS — I5189 Other ill-defined heart diseases: Secondary | ICD-10-CM

## 2023-07-06 DIAGNOSIS — J90 Pleural effusion, not elsewhere classified: Secondary | ICD-10-CM | POA: Diagnosis not present

## 2023-07-06 DIAGNOSIS — I110 Hypertensive heart disease with heart failure: Secondary | ICD-10-CM | POA: Diagnosis present

## 2023-07-06 DIAGNOSIS — I10 Essential (primary) hypertension: Secondary | ICD-10-CM | POA: Diagnosis present

## 2023-07-06 DIAGNOSIS — Z888 Allergy status to other drugs, medicaments and biological substances status: Secondary | ICD-10-CM | POA: Diagnosis not present

## 2023-07-06 DIAGNOSIS — Z5900 Homelessness unspecified: Secondary | ICD-10-CM | POA: Diagnosis not present

## 2023-07-06 DIAGNOSIS — E119 Type 2 diabetes mellitus without complications: Secondary | ICD-10-CM

## 2023-07-06 DIAGNOSIS — R64 Cachexia: Secondary | ICD-10-CM | POA: Diagnosis present

## 2023-07-06 DIAGNOSIS — T8182XA Emphysema (subcutaneous) resulting from a procedure, initial encounter: Secondary | ICD-10-CM | POA: Diagnosis not present

## 2023-07-06 DIAGNOSIS — Z91040 Latex allergy status: Secondary | ICD-10-CM

## 2023-07-06 DIAGNOSIS — J440 Chronic obstructive pulmonary disease with acute lower respiratory infection: Secondary | ICD-10-CM | POA: Diagnosis present

## 2023-07-06 DIAGNOSIS — F319 Bipolar disorder, unspecified: Secondary | ICD-10-CM | POA: Diagnosis present

## 2023-07-06 DIAGNOSIS — Z1152 Encounter for screening for COVID-19: Secondary | ICD-10-CM | POA: Diagnosis not present

## 2023-07-06 DIAGNOSIS — Z791 Long term (current) use of non-steroidal anti-inflammatories (NSAID): Secondary | ICD-10-CM

## 2023-07-06 DIAGNOSIS — R54 Age-related physical debility: Secondary | ICD-10-CM | POA: Diagnosis present

## 2023-07-06 DIAGNOSIS — A419 Sepsis, unspecified organism: Secondary | ICD-10-CM | POA: Diagnosis present

## 2023-07-06 DIAGNOSIS — J9601 Acute respiratory failure with hypoxia: Secondary | ICD-10-CM | POA: Diagnosis present

## 2023-07-06 DIAGNOSIS — Z885 Allergy status to narcotic agent status: Secondary | ICD-10-CM

## 2023-07-06 DIAGNOSIS — Z833 Family history of diabetes mellitus: Secondary | ICD-10-CM

## 2023-07-06 DIAGNOSIS — Z87891 Personal history of nicotine dependence: Secondary | ICD-10-CM | POA: Diagnosis not present

## 2023-07-06 DIAGNOSIS — E1069 Type 1 diabetes mellitus with other specified complication: Secondary | ICD-10-CM

## 2023-07-06 DIAGNOSIS — R601 Generalized edema: Secondary | ICD-10-CM

## 2023-07-06 DIAGNOSIS — E860 Dehydration: Secondary | ICD-10-CM | POA: Diagnosis present

## 2023-07-06 DIAGNOSIS — Z720 Tobacco use: Secondary | ICD-10-CM | POA: Diagnosis not present

## 2023-07-06 DIAGNOSIS — M797 Fibromyalgia: Secondary | ICD-10-CM | POA: Diagnosis present

## 2023-07-06 DIAGNOSIS — Z5941 Food insecurity: Secondary | ICD-10-CM

## 2023-07-06 DIAGNOSIS — Z882 Allergy status to sulfonamides status: Secondary | ICD-10-CM | POA: Diagnosis not present

## 2023-07-06 DIAGNOSIS — I5032 Chronic diastolic (congestive) heart failure: Secondary | ICD-10-CM | POA: Diagnosis present

## 2023-07-06 DIAGNOSIS — R630 Anorexia: Secondary | ICD-10-CM | POA: Diagnosis present

## 2023-07-06 DIAGNOSIS — R6520 Severe sepsis without septic shock: Secondary | ICD-10-CM | POA: Diagnosis present

## 2023-07-06 DIAGNOSIS — J189 Pneumonia, unspecified organism: Principal | ICD-10-CM

## 2023-07-06 DIAGNOSIS — J95811 Postprocedural pneumothorax: Secondary | ICD-10-CM | POA: Diagnosis not present

## 2023-07-06 DIAGNOSIS — J939 Pneumothorax, unspecified: Secondary | ICD-10-CM | POA: Diagnosis not present

## 2023-07-06 DIAGNOSIS — J449 Chronic obstructive pulmonary disease, unspecified: Secondary | ICD-10-CM | POA: Diagnosis not present

## 2023-07-06 DIAGNOSIS — Z751 Person awaiting admission to adequate facility elsewhere: Secondary | ICD-10-CM

## 2023-07-06 DIAGNOSIS — Z79899 Other long term (current) drug therapy: Secondary | ICD-10-CM

## 2023-07-06 DIAGNOSIS — Z90710 Acquired absence of both cervix and uterus: Secondary | ICD-10-CM

## 2023-07-06 DIAGNOSIS — F310 Bipolar disorder, current episode hypomanic: Secondary | ICD-10-CM | POA: Diagnosis present

## 2023-07-06 DIAGNOSIS — Z91048 Other nonmedicinal substance allergy status: Secondary | ICD-10-CM

## 2023-07-06 DIAGNOSIS — R61 Generalized hyperhidrosis: Secondary | ICD-10-CM | POA: Diagnosis present

## 2023-07-06 LAB — COMPREHENSIVE METABOLIC PANEL
ALT: 16 U/L (ref 0–44)
AST: 12 U/L — ABNORMAL LOW (ref 15–41)
Albumin: 3.3 g/dL — ABNORMAL LOW (ref 3.5–5.0)
Alkaline Phosphatase: 76 U/L (ref 38–126)
Anion gap: 20 — ABNORMAL HIGH (ref 5–15)
BUN: 13 mg/dL (ref 6–20)
CO2: 20 mmol/L — ABNORMAL LOW (ref 22–32)
Calcium: 9 mg/dL (ref 8.9–10.3)
Chloride: 98 mmol/L (ref 98–111)
Creatinine, Ser: 0.33 mg/dL — ABNORMAL LOW (ref 0.44–1.00)
GFR, Estimated: >60 mL/min (ref >60)
Glucose, Bld: 295 mg/dL — ABNORMAL HIGH (ref 70–99)
Potassium: 4.2 mmol/L (ref 3.5–5.1)
Sodium: 138 mmol/L (ref 135–145)
Total Bilirubin: 1.5 mg/dL — ABNORMAL HIGH (ref 0.0–1.2)
Total Protein: 7.4 g/dL (ref 6.5–8.1)

## 2023-07-06 LAB — URINALYSIS, W/ REFLEX TO CULTURE (INFECTION SUSPECTED)
Bacteria, UA: NONE SEEN
Bilirubin Urine: NEGATIVE
Glucose, UA: >=500 mg/dL — AB
Hgb urine dipstick: NEGATIVE
Ketones, ur: 80 mg/dL — AB
Leukocytes,Ua: NEGATIVE
Nitrite: NEGATIVE
Protein, ur: NEGATIVE mg/dL
Specific Gravity, Urine: 1.033 — ABNORMAL HIGH (ref 1.005–1.030)
pH: 5 (ref 5.0–8.0)

## 2023-07-06 LAB — CBC WITH DIFFERENTIAL/PLATELET
Abs Immature Granulocytes: 0.57 10*3/uL — ABNORMAL HIGH (ref 0.00–0.07)
Basophils Absolute: 0.1 10*3/uL (ref 0.0–0.1)
Basophils Relative: 0 %
Eosinophils Absolute: 0 10*3/uL (ref 0.0–0.5)
Eosinophils Relative: 0 %
HCT: 53.8 % — ABNORMAL HIGH (ref 36.0–46.0)
Hemoglobin: 17.2 g/dL — ABNORMAL HIGH (ref 12.0–15.0)
Immature Granulocytes: 4 %
Lymphocytes Relative: 6 %
Lymphs Abs: 1 10*3/uL (ref 0.7–4.0)
MCH: 31 pg (ref 26.0–34.0)
MCHC: 32 g/dL (ref 30.0–36.0)
MCV: 97.1 fL (ref 80.0–100.0)
Monocytes Absolute: 1.3 10*3/uL — ABNORMAL HIGH (ref 0.1–1.0)
Monocytes Relative: 8 %
Neutro Abs: 13.1 10*3/uL — ABNORMAL HIGH (ref 1.7–7.7)
Neutrophils Relative %: 82 %
Platelets: 269 10*3/uL (ref 150–400)
RBC: 5.54 MIL/uL — ABNORMAL HIGH (ref 3.87–5.11)
RDW: 12.7 % (ref 11.5–15.5)
WBC: 16 10*3/uL — ABNORMAL HIGH (ref 4.0–10.5)
nRBC: 0 % (ref 0.0–0.2)

## 2023-07-06 LAB — RESP PANEL BY RT-PCR (RSV, FLU A&B, COVID)  RVPGX2
Influenza A by PCR: NEGATIVE
Influenza B by PCR: NEGATIVE
Resp Syncytial Virus by PCR: NEGATIVE
SARS Coronavirus 2 by RT PCR: NEGATIVE

## 2023-07-06 LAB — CBC
HCT: 50.3 % — ABNORMAL HIGH (ref 36.0–46.0)
Hemoglobin: 15.9 g/dL — ABNORMAL HIGH (ref 12.0–15.0)
MCH: 31.2 pg (ref 26.0–34.0)
MCHC: 31.6 g/dL (ref 30.0–36.0)
MCV: 98.8 fL (ref 80.0–100.0)
Platelets: 234 10*3/uL (ref 150–400)
RBC: 5.09 MIL/uL (ref 3.87–5.11)
RDW: 12.7 % (ref 11.5–15.5)
WBC: 15.5 10*3/uL — ABNORMAL HIGH (ref 4.0–10.5)
nRBC: 0 % (ref 0.0–0.2)

## 2023-07-06 LAB — BASIC METABOLIC PANEL
Anion gap: 14 (ref 5–15)
BUN: 12 mg/dL (ref 6–20)
CO2: 20 mmol/L — ABNORMAL LOW (ref 22–32)
Calcium: 8.1 mg/dL — ABNORMAL LOW (ref 8.9–10.3)
Chloride: 98 mmol/L (ref 98–111)
Creatinine, Ser: 0.34 mg/dL — ABNORMAL LOW (ref 0.44–1.00)
GFR, Estimated: >60 mL/min (ref >60)
Glucose, Bld: 370 mg/dL — ABNORMAL HIGH (ref 70–99)
Potassium: 4.2 mmol/L (ref 3.5–5.1)
Sodium: 132 mmol/L — ABNORMAL LOW (ref 135–145)

## 2023-07-06 LAB — GLUCOSE, CAPILLARY: Glucose-Capillary: 340 mg/dL — ABNORMAL HIGH (ref 70–99)

## 2023-07-06 LAB — BRAIN NATRIURETIC PEPTIDE: B Natriuretic Peptide: 211.7 pg/mL — ABNORMAL HIGH (ref 0.0–100.0)

## 2023-07-06 LAB — PROTIME-INR
INR: 1 (ref 0.8–1.2)
Prothrombin Time: 13.4 s (ref 11.4–15.2)

## 2023-07-06 LAB — CREATININE, SERUM
Creatinine, Ser: 0.54 mg/dL (ref 0.44–1.00)
GFR, Estimated: >60 mL/min (ref >60)

## 2023-07-06 LAB — APTT: aPTT: 27 s (ref 24–36)

## 2023-07-06 MED ORDER — INSULIN ASPART 100 UNIT/ML IJ SOLN
8.0000 [IU] | Freq: Three times a day (TID) | INTRAMUSCULAR | Status: DC
  Administered 2023-07-07 – 2023-07-10 (×9): 8 [IU] via SUBCUTANEOUS
  Filled 2023-07-06: qty 0.08

## 2023-07-06 MED ORDER — SODIUM CHLORIDE 0.9 % IV SOLN
500.0000 mg | Freq: Once | INTRAVENOUS | Status: AC
  Administered 2023-07-06: 500 mg via INTRAVENOUS
  Filled 2023-07-06: qty 5

## 2023-07-06 MED ORDER — GUAIFENESIN-DM 100-10 MG/5ML PO SYRP
5.0000 mL | ORAL_SOLUTION | ORAL | Status: DC | PRN
  Administered 2023-07-06 – 2023-07-15 (×8): 5 mL via ORAL
  Filled 2023-07-06 (×7): qty 10

## 2023-07-06 MED ORDER — INSULIN ASPART 100 UNIT/ML IJ SOLN
0.0000 [IU] | Freq: Three times a day (TID) | INTRAMUSCULAR | Status: DC
  Administered 2023-07-07: 5 [IU] via SUBCUTANEOUS
  Administered 2023-07-07: 7 [IU] via SUBCUTANEOUS
  Administered 2023-07-07 – 2023-07-08 (×3): 3 [IU] via SUBCUTANEOUS
  Administered 2023-07-08: 5 [IU] via SUBCUTANEOUS
  Administered 2023-07-09: 2 [IU] via SUBCUTANEOUS
  Administered 2023-07-09: 1 [IU] via SUBCUTANEOUS
  Administered 2023-07-10: 2 [IU] via SUBCUTANEOUS
  Administered 2023-07-10: 3 [IU] via SUBCUTANEOUS
  Administered 2023-07-10: 7 [IU] via SUBCUTANEOUS
  Administered 2023-07-11: 9 [IU] via SUBCUTANEOUS
  Administered 2023-07-11: 5 [IU] via SUBCUTANEOUS
  Administered 2023-07-11: 3 [IU] via SUBCUTANEOUS
  Administered 2023-07-12: 7 [IU] via SUBCUTANEOUS
  Administered 2023-07-12 (×2): 3 [IU] via SUBCUTANEOUS
  Administered 2023-07-13: 2 [IU] via SUBCUTANEOUS
  Administered 2023-07-13: 3 [IU] via SUBCUTANEOUS
  Administered 2023-07-13: 2 [IU] via SUBCUTANEOUS
  Administered 2023-07-15: 3 [IU] via SUBCUTANEOUS
  Administered 2023-07-15: 5 [IU] via SUBCUTANEOUS
  Administered 2023-07-16: 7 [IU] via SUBCUTANEOUS
  Administered 2023-07-17: 9 [IU] via SUBCUTANEOUS
  Administered 2023-07-18: 3 [IU] via SUBCUTANEOUS
  Administered 2023-07-18: 7 [IU] via SUBCUTANEOUS
  Administered 2023-07-19 – 2023-07-20 (×2): 3 [IU] via SUBCUTANEOUS
  Administered 2023-07-20: 2 [IU] via SUBCUTANEOUS
  Administered 2023-07-21: 5 [IU] via SUBCUTANEOUS
  Filled 2023-07-06: qty 0.09

## 2023-07-06 MED ORDER — ALBUTEROL SULFATE (2.5 MG/3ML) 0.083% IN NEBU
2.5000 mg | INHALATION_SOLUTION | Freq: Once | RESPIRATORY_TRACT | Status: AC
Start: 2023-07-06 — End: 2023-07-06
  Administered 2023-07-06: 2.5 mg via RESPIRATORY_TRACT
  Filled 2023-07-06: qty 3

## 2023-07-06 MED ORDER — LACTATED RINGERS IV SOLN: INTRAVENOUS | Status: AC

## 2023-07-06 MED ORDER — POLYETHYLENE GLYCOL 3350 17 G PO PACK
17.0000 g | PACK | Freq: Every day | ORAL | Status: DC | PRN
  Administered 2023-07-11 – 2023-07-12 (×2): 17 g via ORAL
  Filled 2023-07-06 (×2): qty 1

## 2023-07-06 MED ORDER — IPRATROPIUM-ALBUTEROL 0.5-2.5 (3) MG/3ML IN SOLN
3.0000 mL | Freq: Once | RESPIRATORY_TRACT | Status: AC
  Administered 2023-07-06: 3 mL via RESPIRATORY_TRACT
  Filled 2023-07-06: qty 3

## 2023-07-06 MED ORDER — INSULIN ASPART 100 UNIT/ML IJ SOLN
0.0000 [IU] | Freq: Every day | INTRAMUSCULAR | Status: DC
  Administered 2023-07-06: 4 [IU] via SUBCUTANEOUS
  Administered 2023-07-09: 3 [IU] via SUBCUTANEOUS
  Administered 2023-07-10 – 2023-07-11 (×2): 2 [IU] via SUBCUTANEOUS
  Administered 2023-07-15: 3 [IU] via SUBCUTANEOUS
  Administered 2023-07-16: 2 [IU] via SUBCUTANEOUS
  Administered 2023-07-17 – 2023-07-19 (×3): 3 [IU] via SUBCUTANEOUS
  Filled 2023-07-06: qty 0.05

## 2023-07-06 MED ORDER — CEFTRIAXONE SODIUM 2 G IJ SOLR
2.0000 g | INTRAMUSCULAR | Status: AC
  Administered 2023-07-07 – 2023-07-13 (×7): 2 g via INTRAVENOUS
  Filled 2023-07-06 (×7): qty 20

## 2023-07-06 MED ORDER — INSULIN LISPRO 200 UNIT/ML ~~LOC~~ SOPN
8.0000 [IU] | PEN_INJECTOR | Freq: Three times a day (TID) | SUBCUTANEOUS | Status: DC
Start: 2023-07-07 — End: 2023-07-06

## 2023-07-06 MED ORDER — ACETAMINOPHEN 325 MG PO TABS
650.0000 mg | ORAL_TABLET | Freq: Four times a day (QID) | ORAL | Status: DC | PRN
  Administered 2023-07-07 – 2023-07-08 (×4): 650 mg via ORAL
  Filled 2023-07-06 (×4): qty 2

## 2023-07-06 MED ORDER — CEFTRIAXONE SODIUM 2 G IJ SOLR
2.0000 g | Freq: Once | INTRAMUSCULAR | Status: AC
  Administered 2023-07-06: 2 g via INTRAVENOUS
  Filled 2023-07-06: qty 20

## 2023-07-06 MED ORDER — AZITHROMYCIN 500 MG IV SOLR
500.0000 mg | INTRAVENOUS | Status: AC
  Administered 2023-07-07 – 2023-07-08 (×2): 500 mg via INTRAVENOUS
  Filled 2023-07-06 (×2): qty 5

## 2023-07-06 MED ORDER — SODIUM CHLORIDE 0.9% FLUSH
3.0000 mL | Freq: Two times a day (BID) | INTRAVENOUS | Status: DC
  Administered 2023-07-06 – 2023-07-19 (×23): 3 mL via INTRAVENOUS

## 2023-07-06 MED ORDER — ENOXAPARIN SODIUM 30 MG/0.3ML IJ SOSY
30.0000 mg | PREFILLED_SYRINGE | INTRAMUSCULAR | Status: DC
  Administered 2023-07-07 – 2023-07-10 (×4): 30 mg via SUBCUTANEOUS
  Filled 2023-07-06 (×4): qty 0.3

## 2023-07-06 MED ORDER — INSULIN GLARGINE-YFGN 100 UNIT/ML ~~LOC~~ SOLN
12.0000 [IU] | Freq: Every day | SUBCUTANEOUS | Status: DC
  Administered 2023-07-06 – 2023-07-10 (×5): 12 [IU] via SUBCUTANEOUS
  Filled 2023-07-06 (×5): qty 0.12

## 2023-07-06 MED ORDER — ACETAMINOPHEN 650 MG RE SUPP: 650.0000 mg | Freq: Four times a day (QID) | RECTAL | Status: DC | PRN

## 2023-07-06 MED ORDER — SODIUM CHLORIDE 0.9 % IV BOLUS
1000.0000 mL | Freq: Once | INTRAVENOUS | Status: AC
  Administered 2023-07-06: 1000 mL via INTRAVENOUS

## 2023-07-06 NOTE — Sepsis Progress Note (Signed)
Notified bedside nurse of need to draw lactic acid.  

## 2023-07-06 NOTE — H&P (Addendum)
 History and Physical    Patient: Courtney Bennett FMW:[id number] DOB: 06-19-1965 DOA: 07/06/2023 DOS: the patient was seen and examined on 07/06/2023 PCP: Patient, No Pcp Per  Patient coming from: Home  Chief Complaint:  Chief Complaint  Patient presents with   Weakness   HPI: Courtney Bennett is a 58 y.o. female with medical history significant of type 1 diabetes mellitus.  Patient reports chronic poor appetite and thin weight.  Patient was in her usual state of health till approximately a week ago when she reports a new onset of cough, scant sputum associated with sensation of shortness of breath with exertion, generalized weakness, subjective fever, not documented, no chest pain except when coughing no leg swelling no palpitation no syncope.  Patient also reports very poor appetite for last several days and finally came to the ER after her landlord called EMS.  Per report patient was 85% on room air for EMS and started on 6 L/min of supplementary oxygen, patient currently on 3 L.  Patient offers no other complaints besides above. Review of Systems: As mentioned in the history of present illness. All other systems reviewed and are negative. Past Medical History:  Diagnosis Date   Bipolar 1 disorder (HCC)    COPD (chronic obstructive pulmonary disease) (HCC)    Diabetes mellitus    Fibromyalgia    Grade I diastolic dysfunction 06/06/2023   Hypertension    Past Surgical History:  Procedure Laterality Date   ABDOMINAL HYSTERECTOMY  01/2019   BREAST BIOPSY Right    BREAST BIOPSY Right 02/18/2023   US  RT BREAST BX W LOC DEV 1ST LESION IMG BX SPEC US  GUIDE 02/18/2023 [facility]MOGRAPHY   CESAREAN SECTION     HERNIA REPAIR     Social History:  reports that she quit smoking about 8 years ago. Her smoking use included cigarettes. She has never used smokeless tobacco. She reports current alcohol use. She reports that she does not use drugs.  Allergies  Allergen Reactions   Nickel Rash     Rash after ear piercing and navel piercings   Oxycodone -Acetaminophen  Nausea And Vomiting   Metformin  Diarrhea   Elemental Sulfur Hives, Rash and Other (See Comments)    Burns the skin, also   Lamictal [Lamotrigine] Rash   Latex Itching and Rash   Sulfa Antibiotics Rash   Sulfur Hives and Rash    Other Reaction(s): Other  Burns the skin, also   Tape Rash and Other (See Comments)    Irritates the skin    Family History  Problem Relation Age of Onset   Diabetes Father    Breast cancer Mother    Breast cancer Paternal Grandmother     Prior to Admission medications   Medication Sig Start Date End Date Taking? Authorizing Provider  acetaminophen  (TYLENOL ) 500 MG tablet Take 1,000 mg by mouth every 6 (six) hours as needed for headache or moderate pain (pain score 4-6).    [provider]  albuterol  (VENTOLIN  HFA) 108 (90 Base) MCG/ACT inhaler Inhale 1-2 puffs into the lungs every 6 (six) hours as needed. 06/09/23   Pokhrel, Laxman, MD  amLODipine  (NORVASC ) 10 MG tablet Take 1 tablet (10 mg total) by mouth daily. 06/09/23   Pokhrel, Vernal, MD  APPLE CIDER VINEGAR PO Take 1 tablet by mouth daily.    [provider]  aspirin  EC 81 MG tablet Take 1 tablet (81 mg total) by mouth daily. Swallow whole. 06/09/23   Sonjia Vernal, MD  Calcium   Carbonate-Vitamin D (CALCIUM -VITAMIN D PO) Take 2 tablets by mouth daily.    [provider]  Continuous Blood Gluc Sensor (FREESTYLE LIBRE 14 DAY SENSOR) MISC 1 application by Subdermal route See admin instructions. Check blood glucose 1-2 times daily. E11.65 05/11/19   Cirigliano, Ronal POUR, DO  CRANBERRY PO Take 2 tablets by mouth daily.    [provider]  estrogens , conjugated, (PREMARIN ) 0.3 MG tablet Take 1 tablet (0.3 mg total) by mouth daily. 06/09/23   Pokhrel, Vernal, MD  gabapentin  (NEURONTIN ) 100 MG capsule Take 1 capsule (100 mg total) by mouth 2 (two) times daily. 06/09/23   Pokhrel, Vernal, MD  HUMALOG  KWIKPEN 200  UNIT/ML KwikPen Inject 8 Units into the skin 3 (three) times daily with meals. 8 units 3 times per day before meals + correctional insulin  MDD 50 units per day Subcutaneous for 30 days 06/09/23 07/09/23  Pokhrel, Laxman, MD  insulin  degludec (TRESIBA ) 100 UNIT/ML FlexTouch Pen Inject 22 Units into the skin daily. 06/09/23 09/07/23  Pokhrel, Laxman, MD  Insulin  Pen Needle 32G X 4 MM MISC Use as directed with insulin  pens 03/22/23   Jillian Buttery, MD  ketoconazole  (NIZORAL ) 2 % shampoo Apply 1 Application topically 2 (two) times a week. 06/10/23   Pokhrel, Laxman, MD  losartan  (COZAAR ) 50 MG tablet Take 1 tablet (50 mg total) by mouth daily. 06/09/23   Pokhrel, Vernal, MD  Multiple Vitamins-Minerals (CENTRUM ADULT PO) Take 1 tablet by mouth daily.    [provider]  naproxen sodium (ALEVE) 220 MG tablet Take 440 mg by mouth daily as needed (pain).    [provider]  nitroGLYCERIN  (NITROSTAT ) 0.4 MG SL tablet Place 1 tablet (0.4 mg total) under the tongue every 5 (five) minutes as needed for chest pain. 06/09/23   Pokhrel, Vernal, MD  umeclidinium bromide  (INCRUSE ELLIPTA ) 62.5 MCG/ACT AEPB Inhale 1 puff into the lungs daily. Patient taking differently: Inhale 1 puff into the lungs daily as needed (wheezing/SOB). 03/22/23   Jillian Buttery, MD    Physical Exam: Vitals:   07/06/23 1519 07/06/23 1600 07/06/23 1700 07/06/23 1720  BP: (!) 158/87 (!) 168/89 (!) 152/76   Pulse: (!) 112 (!) 109 (!) 103   Resp: 20 16    Temp: 99.2 F (37.3 C)     SpO2: 91% 92% 94% 95%  Weight: 44 kg     Height: 5' 3 (1.6 m)      General: Patient is thin appearing, fatigued, tends to keep eyes closed during encounter.  However is oriented to place person time and gives a coherent account of her symptoms. Respiratory exam: Again thin lady ribs visible.  Right lung field posterior crackles heard Otherwise air entry vesicular without any wheezes Cardiovascular exam S1-S2 normal Abdomen soft  nontender Extremities warm without edema No focal motor deficit. Data Reviewed:  Labs on Admission:  Results for orders placed or performed during the hospital encounter of 07/06/23 (from the past 24 hours)  Comprehensive metabolic panel     Status: Abnormal   Collection Time: 07/06/23  4:06 PM  Result Value Ref Range   Sodium 138 135 - 145 mmol/L   Potassium 4.2 3.5 - 5.1 mmol/L   Chloride 98 98 - 111 mmol/L   CO2 20 (L) 22 - 32 mmol/L   Glucose, Bld 295 (H) 70 - 99 mg/dL   BUN 13 6 - 20 mg/dL   Creatinine, Ser 9.66 (L) 0.44 - 1.00 mg/dL   Calcium  9.0 8.9 - 10.3 mg/dL  Total Protein 7.4 6.5 - 8.1 g/dL   Albumin 3.3 (L) 3.5 - 5.0 g/dL   AST 12 (L) 15 - 41 U/L   ALT 16 0 - 44 U/L   Alkaline Phosphatase 76 38 - 126 U/L   Total Bilirubin 1.5 (H) 0.0 - 1.2 mg/dL   GFR, Estimated >39 >39 mL/min   Anion gap 20 (H) 5 - 15  CBC with Differential     Status: Abnormal   Collection Time: 07/06/23  4:06 PM  Result Value Ref Range   WBC 16.0 (H) 4.0 - 10.5 K/uL   RBC 5.54 (H) 3.87 - 5.11 MIL/uL   Hemoglobin 17.2 (H) 12.0 - 15.0 g/dL   HCT 46.1 (H) 63.9 - 53.9 %   MCV 97.1 80.0 - 100.0 fL   MCH 31.0 26.0 - 34.0 pg   MCHC 32.0 30.0 - 36.0 g/dL   RDW 87.2 88.4 - 84.4 %   Platelets 269 150 - 400 K/uL   nRBC 0.0 0.0 - 0.2 %   Neutrophils Relative % 82 %   Neutro Abs 13.1 (H) 1.7 - 7.7 K/uL   Lymphocytes Relative 6 %   Lymphs Abs 1.0 0.7 - 4.0 K/uL   Monocytes Relative 8 %   Monocytes Absolute 1.3 (H) 0.1 - 1.0 K/uL   Eosinophils Relative 0 %   Eosinophils Absolute 0.0 0.0 - 0.5 K/uL   Basophils Relative 0 %   Basophils Absolute 0.1 0.0 - 0.1 K/uL   Immature Granulocytes 4 %   Abs Immature Granulocytes 0.57 (H) 0.00 - 0.07 K/uL  Protime-INR     Status: None   Collection Time: 07/06/23  4:06 PM  Result Value Ref Range   Prothrombin Time 13.4 11.4 - 15.2 seconds   INR 1.0 0.8 - 1.2  APTT     Status: None   Collection Time: 07/06/23  4:06 PM  Result Value Ref Range   aPTT 27 24  - 36 seconds  Brain natriuretic peptide     Status: Abnormal   Collection Time: 07/06/23  4:06 PM  Result Value Ref Range   B Natriuretic Peptide 211.7 (H) 0.0 - 100.0 pg/mL   Basic Metabolic Panel: Recent Labs  Lab 07/06/23 1606  NA 138  K 4.2  CL 98  CO2 20*  GLUCOSE 295*  BUN 13  CREATININE 0.33*  CALCIUM  9.0   Liver Function Tests: Recent Labs  Lab 07/06/23 1606  AST 12*  ALT 16  ALKPHOS 76  BILITOT 1.5*  PROT 7.4  ALBUMIN 3.3*   No results for input(s): LIPASE, AMYLASE in the last 168 hours. No results for input(s): AMMONIA in the last 168 hours. CBC: Recent Labs  Lab 07/06/23 1606  WBC 16.0*  NEUTROABS 13.1*  HGB 17.2*  HCT 53.8*  MCV 97.1  PLT 269   Cardiac Enzymes: No results for input(s): CKTOTAL, CKMB, CKMBINDEX, TROPONINIHS in the last 168 hours.  BNP (last 3 results) No results for input(s): PROBNP in the last 8760 hours. CBG: No results for input(s): GLUCAP in the last 168 hours.  Radiological Exams on Admission:  DG Chest Port 1 View Result Date: 07/06/2023 CLINICAL DATA:  Question of sepsis to evaluate for abnormality. Weakness. Flu-like symptoms for 1 week. Decreased oxygenation. Nausea and diarrhea. Former smoker. EXAM: PORTABLE CHEST 1 VIEW COMPARISON:  03/14/2023 FINDINGS: Heart size and pulmonary vascularity are normal. New area of rounded masslike consolidation in the left mid lung measuring 3.6 cm diameter. This could represent a pulmonary mass  or focal pneumonia. CT suggested for further evaluation. Right lung is clear. No pleural effusions. No pneumothorax. Mediastinal contours appear intact. IMPRESSION: Mass or focal consolidation in the left mid lung. CT suggested for further evaluation. Electronically Signed   By: Elsie Gravely M.D.   On: 07/06/2023 17:04   EKG: Independently reviewed. Increased voltage. Likely due to thin habitus. Sinus tachy. No significant ST_T waves changes.     Assessment and Plan: *  Pneumonia Patient has visible opacification of the left mid lung field with associated hypoxemia requiring 3 L/min of supplementary oxygen, coughing spells and leukocytosis.  As well as evidence of dehydration in terms of hemoconcentration hemoglobin up to 17.2 g/dL.  At this time patient is s/p 1 L fluid bolus.  I will treat patient with another liter of fluid bolus to be infused over the next 10 hours.  Patient does meet sepsis criteria.  Lactic acid level is pending.  Blood cultures are pending.  Start treatment with ceftriaxone  and azithromycin .  RT-PCR is pending. Radiology feels this could be a mass - consider routine CT after Rx.  DM (diabetes mellitus) (HCC) Patient reports not taking her basal insulin  for the last several days.  However claims that she has been taking her sliding scale.  Patient is noted to have slight acidosis on basic metabolic panel with slight elevation of anion gap.  Glucose level is currently 295.  It is possible patient is in slight DKA.  Given her type 1 diabetes mellitus diagnosis.  Fluids as above.  Patient will be treated with insulin  sliding scale.  And we will trend her glucose and basic metabolic panel.  Started on insluin sliding scale. 8 unit insulin  with meals and lantus  12 unit daily (first dose now)   Home med rec pending pharmacy input.   Advance Care Planning:   Code Status: Full Code   Consults: none at this time.  Family Communication: per patietn.  Severity of Illness: The appropriate patient status for this patient is INPATIENT. Inpatient status is judged to be reasonable and necessary in order to provide the required intensity of service to ensure the patient's safety. The patient's presenting symptoms, physical exam findings, and initial radiographic and laboratory data in the context of their chronic comorbidities is felt to place them at high risk for further clinical deterioration. Furthermore, it is not anticipated that the patient will be  medically stable for discharge from the hospital within 2 midnights of admission.   * I certify that at the point of admission it is my clinical judgment that the patient will require inpatient hospital care spanning beyond 2 midnights from the point of admission due to high intensity of service, high risk for further deterioration and high frequency of surveillance required.*  Author: Jacqulyn Divine, MD 07/06/2023 5:53 PM  For on call review [email or web address].

## 2023-07-06 NOTE — ED Provider Notes (Signed)
 [place] EMERGENCY DEPARTMENT AT [facility] Provider Note   CSN: [id number] Arrival date & time: 07/06/23  1507     History  Chief Complaint  Patient presents with   Weakness    Courtney Bennett is a 58 y.o. female.  Patient has history of COPD.  She complains of cough and shortness of breath for a week with some fevers and chills.  The history is provided by the patient and medical records. No language interpreter was used.  Weakness Severity:  Moderate Onset quality:  Sudden Timing:  Constant Progression:  Worsening Chronicity:  New Context: not alcohol use   Relieved by:  Nothing Worsened by:  Nothing Associated symptoms: cough   Associated symptoms: no abdominal pain, no chest pain, no diarrhea, no frequency, no headaches and no seizures        Home Medications Prior to Admission medications   Medication Sig Start Date End Date Taking? Authorizing Provider  acetaminophen  (TYLENOL ) 500 MG tablet Take 1,000 mg by mouth every 6 (six) hours as needed for headache or moderate pain (pain score 4-6).    [provider]  albuterol  (VENTOLIN  HFA) 108 (90 Base) MCG/ACT inhaler Inhale 1-2 puffs into the lungs every 6 (six) hours as needed. 06/09/23   Pokhrel, Vernal, MD  amLODipine  (NORVASC ) 10 MG tablet Take 1 tablet (10 mg total) by mouth daily. 06/09/23   Pokhrel, Vernal, MD  APPLE CIDER VINEGAR PO Take 1 tablet by mouth daily.    [provider]  aspirin  EC 81 MG tablet Take 1 tablet (81 mg total) by mouth daily. Swallow whole. 06/09/23   Pokhrel, Vernal, MD  Calcium  Carbonate-Vitamin D (CALCIUM -VITAMIN D PO) Take 2 tablets by mouth daily.    [provider]  Continuous Blood Gluc Sensor (FREESTYLE LIBRE 14 DAY SENSOR) MISC 1 application by Subdermal route See admin instructions. Check blood glucose 1-2 times daily. E11.65 05/11/19   Cirigliano, Ronal POUR, DO  CRANBERRY PO Take 2 tablets by mouth daily.    [provider]  estrogens ,  conjugated, (PREMARIN ) 0.3 MG tablet Take 1 tablet (0.3 mg total) by mouth daily. 06/09/23   Pokhrel, Vernal, MD  gabapentin  (NEURONTIN ) 100 MG capsule Take 1 capsule (100 mg total) by mouth 2 (two) times daily. 06/09/23   Pokhrel, Vernal, MD  HUMALOG  KWIKPEN 200 UNIT/ML KwikPen Inject 8 Units into the skin 3 (three) times daily with meals. 8 units 3 times per day before meals + correctional insulin  MDD 50 units per day Subcutaneous for 30 days 06/09/23 07/09/23  Pokhrel, Laxman, MD  insulin  degludec (TRESIBA ) 100 UNIT/ML FlexTouch Pen Inject 22 Units into the skin daily. 06/09/23 09/07/23  Pokhrel, Laxman, MD  Insulin  Pen Needle 32G X 4 MM MISC Use as directed with insulin  pens 03/22/23   Jillian Buttery, MD  ketoconazole  (NIZORAL ) 2 % shampoo Apply 1 Application topically 2 (two) times a week. 06/10/23   Pokhrel, Laxman, MD  losartan  (COZAAR ) 50 MG tablet Take 1 tablet (50 mg total) by mouth daily. 06/09/23   Pokhrel, Vernal, MD  Multiple Vitamins-Minerals (CENTRUM ADULT PO) Take 1 tablet by mouth daily.    [provider]  naproxen sodium (ALEVE) 220 MG tablet Take 440 mg by mouth daily as needed (pain).    [provider]  nitroGLYCERIN  (NITROSTAT ) 0.4 MG SL tablet Place 1 tablet (0.4 mg total) under the tongue every 5 (five) minutes as needed for chest pain. 06/09/23   Pokhrel, Vernal, MD  umeclidinium bromide  [facility]  ELLIPTA) 62.5 MCG/ACT AEPB Inhale 1 puff into the lungs daily. Patient taking differently: Inhale 1 puff into the lungs daily as needed (wheezing/SOB). 03/22/23   Jillian Buttery, MD      Allergies    Nickel, Oxycodone -acetaminophen , Metformin , Elemental sulfur, Lamictal [lamotrigine], Latex, Sulfa antibiotics, Sulfur, and Tape    Review of Systems   Review of Systems  Constitutional:  Negative for appetite change and fatigue.  HENT:  Negative for congestion, ear discharge and sinus pressure.   Eyes:  Negative for discharge.  Respiratory:  Positive for cough.    Cardiovascular:  Negative for chest pain.  Gastrointestinal:  Negative for abdominal pain and diarrhea.  Genitourinary:  Negative for frequency and hematuria.  Musculoskeletal:  Negative for back pain.  Skin:  Negative for rash.  Neurological:  Positive for weakness. Negative for seizures and headaches.  Psychiatric/Behavioral:  Negative for hallucinations.     Physical Exam Updated Vital Signs BP (!) 152/76   Pulse (!) 103   Temp 99.2 F (37.3 C)   Resp 16   Ht 5' 3 (1.6 m)   Wt 44 kg   LMP 11/24/2014   SpO2 95%   BMI 17.18 kg/m  Physical Exam Vitals and nursing note reviewed.  Constitutional:      Appearance: She is well-developed.  HENT:     Head: Normocephalic.     Right Ear: External ear normal.  Eyes:     General: No scleral icterus.    Conjunctiva/sclera: Conjunctivae normal.  Neck:     Thyroid : No thyromegaly.  Cardiovascular:     Rate and Rhythm: Normal rate and regular rhythm.     Heart sounds: No murmur heard.    No friction rub. No gallop.  Pulmonary:     Breath sounds: No stridor. Wheezing present. No rales.  Chest:     Chest wall: No tenderness.  Abdominal:     General: There is no distension.     Tenderness: There is no abdominal tenderness. There is no rebound.  Musculoskeletal:        General: Normal range of motion.     Cervical back: Neck supple.  Lymphadenopathy:     Cervical: No cervical adenopathy.  Skin:    Findings: No erythema or rash.  Neurological:     Mental Status: She is alert and oriented to person, place, and time.     Motor: No abnormal muscle tone.     Coordination: Coordination normal.  Psychiatric:        Behavior: Behavior normal.     ED Results / Procedures / Treatments   Labs (all labs ordered are listed, but only abnormal results are displayed) Labs Reviewed  COMPREHENSIVE METABOLIC PANEL - Abnormal; Notable for the following components:      Result Value   CO2 20 (*)    Glucose, Bld 295 (*)    Creatinine,  Ser 0.33 (*)    Albumin 3.3 (*)    AST 12 (*)    Total Bilirubin 1.5 (*)    Anion gap 20 (*)    All other components within normal limits  CBC WITH DIFFERENTIAL/PLATELET - Abnormal; Notable for the following components:   WBC 16.0 (*)    RBC 5.54 (*)    Hemoglobin 17.2 (*)    HCT 53.8 (*)    Neutro Abs 13.1 (*)    Monocytes Absolute 1.3 (*)    Abs Immature Granulocytes 0.57 (*)    All other components within normal limits  BRAIN  NATRIURETIC PEPTIDE - Abnormal; Notable for the following components:   B Natriuretic Peptide 211.7 (*)    All other components within normal limits  RESP PANEL BY RT-PCR (RSV, FLU A&B, COVID)  RVPGX2  CULTURE, BLOOD (ROUTINE X 2)  CULTURE, BLOOD (ROUTINE X 2)  PROTIME-INR  APTT  URINALYSIS, W/ REFLEX TO CULTURE (INFECTION SUSPECTED)  I-STAT CG4 LACTIC ACID, ED  I-STAT CG4 LACTIC ACID, ED    EKG None  Radiology DG Chest Port 1 View Result Date: 07/06/2023 CLINICAL DATA:  Question of sepsis to evaluate for abnormality. Weakness. Flu-like symptoms for 1 week. Decreased oxygenation. Nausea and diarrhea. Former smoker. EXAM: PORTABLE CHEST 1 VIEW COMPARISON:  03/14/2023 FINDINGS: Heart size and pulmonary vascularity are normal. New area of rounded masslike consolidation in the left mid lung measuring 3.6 cm diameter. This could represent a pulmonary mass or focal pneumonia. CT suggested for further evaluation. Right lung is clear. No pleural effusions. No pneumothorax. Mediastinal contours appear intact. IMPRESSION: Mass or focal consolidation in the left mid lung. CT suggested for further evaluation. Electronically Signed   By: Elsie Gravely M.D.   On: 07/06/2023 17:04    Procedures Procedures    Medications Ordered in ED Medications  cefTRIAXone  (ROCEPHIN ) 2 g in sodium chloride  0.9 % 100 mL IVPB (2 g Intravenous New Bag/Given 07/06/23 1625)  azithromycin  (ZITHROMAX ) 500 mg in sodium chloride  0.9 % 250 mL IVPB (500 mg Intravenous New Bag/Given 07/06/23  1624)  sodium chloride  0.9 % bolus 1,000 mL (1,000 mLs Intravenous New Bag/Given 07/06/23 1625)  ipratropium-albuterol  (DUONEB) 0.5-2.5 (3) MG/3ML nebulizer solution 3 mL (3 mLs Nebulization Given 07/06/23 1718)  albuterol  (PROVENTIL ) (2.5 MG/3ML) 0.083% nebulizer solution 2.5 mg (2.5 mg Nebulization Given 07/06/23 1718)    ED Course/ Medical Decision Making/ A&P   CRITICAL CARE Performed by: Fairy Sermon Total critical care time: 45 minutes Critical care time was exclusive of separately billable procedures and treating other patients. Critical care was necessary to treat or prevent imminent or life-threatening deterioration. Critical care was time spent personally by me on the following activities: development of treatment plan with patient and/or surrogate as well as nursing, discussions with consultants, evaluation of patient's response to treatment, examination of patient, obtaining history from patient or surrogate, ordering and performing treatments and interventions, ordering and review of laboratory studies, ordering and review of radiographic studies, pulse oximetry and re-evaluation of patient's condition.  Click here for ABCD2, HEART and other calculatorsREFRESH Note before signing :1}                              Medical Decision Making Amount and/or Complexity of Data Reviewed Labs: ordered. Radiology: ordered.  Risk Prescription drug management. Decision regarding hospitalization.  This patient presents to the ED for concern of cough, this involves an extensive number of treatment options, and is a complaint that carries with it a high risk of complications and morbidity.  The differential diagnosis includes pneumonia, congestive heart failure, COPD   Co morbidities that complicate the patient evaluation  COPD   Additional history obtained:  Additional history obtained from patient External records from outside source obtained and reviewed including hospital  records   Lab Tests:  I Ordered, and personally interpreted labs.  The pertinent results include: White count 15.5   Imaging Studies ordered:  I ordered imaging studies including infiltrate in the left midlung on chest x-ray I independently visualized and interpreted imaging which showed  infiltrate left midlung I agree with the radiologist interpretation   Cardiac Monitoring: / EKG:  The patient was maintained on a cardiac monitor.  I personally viewed and interpreted the cardiac monitored which showed an underlying rhythm of: Normal sinus rhythm  Consultations Obtained:  I requested consultation with the hospitalist,  and discussed lab and imaging findings as well as pertinent plan - they recommend: Admit   Problem List / ED Course / Critical interventions / Medication management  Pneumonia and COPD I ordered medication including Rocephin  and Zithromax  Reevaluation of the patient after these medicines showed that the patient improved I have reviewed the patients home medicines and have made adjustments as needed   Social Determinants of Health:  None   Test / Admission - Considered:  None  Patient with left upper lobe pneumonia.  She is started on Rocephin  and Zithromax  and will be admitted to medicine        Final Clinical Impression(s) / ED Diagnoses Final diagnoses:  Community acquired pneumonia of left upper lobe of lung    Rx / DC Orders ED Discharge Orders     None         Suzette Pac, MD 07/07/23 [phone]

## 2023-07-06 NOTE — Assessment & Plan Note (Addendum)
-   continue SSI and CBG  - A1c 13.1% - will allow reg diet but if glucose elevates excessively will modify again

## 2023-07-06 NOTE — ED Triage Notes (Signed)
Patient BIB EMS from home c/o weakness.Patient report flu like symptoms x 1 week.Per report patient was found 85% on RA. EMS started 6L o2. Patient c/o nausea, diarrhea denies vomitting.

## 2023-07-06 NOTE — Assessment & Plan Note (Addendum)
-   Met criteria for sepsis on admission with tachycardia, leukocytosis and CXR concerning for pneumonia - Completed Rocephin  and azithromycin  courses - CT chest with multifocal pneumonia, atypical in etiology - s/p bronch 2/7 - Cytology -> no malignant cells, benign bronchial cells and alveolar macrophages; pathology - benign lung tissue; AFB cultures NGTD; d/c airborne precautions - Negative covid, flu, RSV; MRSA PCR negative, urine strep negative, urine legionella negative -Outpatient follow-up with pulmonology in 4 to 6 weeks

## 2023-07-06 NOTE — Sepsis Progress Note (Signed)
 Elink monitoring for the code sepsis protocol.

## 2023-07-07 ENCOUNTER — Inpatient Hospital Stay (HOSPITAL_COMMUNITY): Payer: 59

## 2023-07-07 ENCOUNTER — Other Ambulatory Visit: Payer: Self-pay

## 2023-07-07 ENCOUNTER — Encounter (HOSPITAL_COMMUNITY): Payer: Self-pay | Admitting: Internal Medicine

## 2023-07-07 DIAGNOSIS — J189 Pneumonia, unspecified organism: Secondary | ICD-10-CM | POA: Diagnosis not present

## 2023-07-07 LAB — MRSA NEXT GEN BY PCR, NASAL: MRSA by PCR Next Gen: NOT DETECTED

## 2023-07-07 LAB — BASIC METABOLIC PANEL
Anion gap: 10 (ref 5–15)
BUN: 9 mg/dL (ref 6–20)
CO2: 25 mmol/L (ref 22–32)
Calcium: 8.4 mg/dL — ABNORMAL LOW (ref 8.9–10.3)
Chloride: 101 mmol/L (ref 98–111)
Creatinine, Ser: <0.3 mg/dL — ABNORMAL LOW (ref 0.44–1.00)
Glucose, Bld: 293 mg/dL — ABNORMAL HIGH (ref 70–99)
Potassium: 3.7 mmol/L (ref 3.5–5.1)
Sodium: 136 mmol/L (ref 135–145)

## 2023-07-07 LAB — APTT: aPTT: 30 s (ref 24–36)

## 2023-07-07 LAB — GLUCOSE, CAPILLARY
Glucose-Capillary: 346 mg/dL — ABNORMAL HIGH (ref 70–99)
Glucose-Capillary: 132 mg/dL — ABNORMAL HIGH (ref 70–99)
Glucose-Capillary: 215 mg/dL — ABNORMAL HIGH (ref 70–99)
Glucose-Capillary: 261 mg/dL — ABNORMAL HIGH (ref 70–99)
Glucose-Capillary: 153 mg/dL — ABNORMAL HIGH (ref 70–99)

## 2023-07-07 LAB — PROTIME-INR
INR: 1 (ref 0.8–1.2)
Prothrombin Time: 13.8 s (ref 11.4–15.2)

## 2023-07-07 LAB — CBC
HCT: 47.2 % — ABNORMAL HIGH (ref 36.0–46.0)
Hemoglobin: 14.7 g/dL (ref 12.0–15.0)
MCH: 30.8 pg (ref 26.0–34.0)
MCHC: 31.1 g/dL (ref 30.0–36.0)
MCV: 98.7 fL (ref 80.0–100.0)
Platelets: 241 10*3/uL (ref 150–400)
RBC: 4.78 MIL/uL (ref 3.87–5.11)
RDW: 12.8 % (ref 11.5–15.5)
WBC: 15.5 10*3/uL — ABNORMAL HIGH (ref 4.0–10.5)
nRBC: 0 % (ref 0.0–0.2)

## 2023-07-07 LAB — STREP PNEUMONIAE URINARY ANTIGEN
Strep Pneumo Urinary Antigen: NEGATIVE
Strep Pneumo Urinary Antigen: NEGATIVE

## 2023-07-07 MED ORDER — IOHEXOL 300 MG/ML  SOLN
75.0000 mL | Freq: Once | INTRAMUSCULAR | Status: AC | PRN
  Administered 2023-07-07: 75 mL via INTRAVENOUS

## 2023-07-07 NOTE — Progress Notes (Signed)
 PROGRESS NOTE    Courtney Bennett  FMW:[id number] DOB: July 10, 1965 DOA: 07/06/2023 PCP: Patient, No Pcp Per  Chief Complaint  Patient presents with   Weakness    Brief Narrative:   58 yo with T1DM presenting with cough and shortness of breath.  She's been admitted with CAP.    Assessment & Plan:   Principal Problem:   Pneumonia Active Problems:   DM (diabetes mellitus) (HCC)  Sepsis due to Community Acquired Pneumonia Meets criteria for sepsis with tachycardia, leukocytosis and CXR concerning for pneumonia Continue CAP coverage with ceftriaxone , azithromycin   CXR concerning for mass vs focal consolidation -> will get CT chest Follow blood cultures Negative covid, flu, RSV MRSA PCR, sputum cx, urine strep, urine legionella  Pleuritic Chest Pain Suspect due to above process Consider additional workup if change in description of pain   T1DM Currently on basal bolus (sounds like she's not taking appropriately at home) A1c 13.1 06/2023 Will follow   Cachexia Could be from chronically poorly controlled diabetes Will follow CT above Needs additional evaluation/workup     DVT prophylaxis: lovenox  Code Status: full Family Communication: none Disposition:   Status is: Inpatient Remains inpatient appropriate because: need for continued inpatient care   Consultants:  none  Procedures:  none  Antimicrobials:  Anti-infectives (From admission, onward)    Start     Dose/Rate Route Frequency Ordered Stop   07/07/23 1600  azithromycin  (ZITHROMAX ) 500 mg in sodium chloride  0.9 % 250 mL IVPB        500 mg 250 mL/hr over 60 Minutes Intravenous Every 24 hours 07/06/23 1748 07/09/23 1559   07/07/23 1600  cefTRIAXone  (ROCEPHIN ) 2 g in sodium chloride  0.9 % 100 mL IVPB        2 g 200 mL/hr over 30 Minutes Intravenous Every 24 hours 07/06/23 1748 07/12/23 1559   07/06/23 1600  cefTRIAXone  (ROCEPHIN ) 2 g in sodium chloride  0.9 % 100 mL IVPB        2 g 200 mL/hr over 30 Minutes  Intravenous Once 07/06/23 1555 07/06/23 1655   07/06/23 1600  azithromycin  (ZITHROMAX ) 500 mg in sodium chloride  0.9 % 250 mL IVPB        500 mg 250 mL/hr over 60 Minutes Intravenous  Once 07/06/23 1555 07/06/23 1724       Subjective: C/o pleuritic CP  Objective: Vitals:   07/06/23 2341 07/07/23 0332 07/07/23 0616 07/07/23 1008  BP: (!) 168/70 (!) 155/87 (!) 141/71 (!) 135/95  Pulse: (!) 103 87 82 87  Resp: 20 18 16 16   Temp: 98.9 F (37.2 C) 97.6 F (36.4 C) 97.8 F (36.6 C) (!) 97.5 F (36.4 C)  TempSrc: Oral Oral Oral Oral  SpO2: 95% 93% 95% 92%  Weight:      Height:        Intake/Output Summary (Last 24 hours) at 07/07/2023 1025 Last data filed at 07/07/2023 0500 Gross per 24 hour  Intake 1672.23 ml  Output --  Net 1672.23 ml   Filed Weights   07/06/23 1519 07/06/23 1900  Weight: 44 kg 39.3 kg    Examination:  General exam: cachetic, tired appearing - ill appearing Respiratory system: anterior crackles, diminished Cardiovascular system: S1 & S2 heard, RRR. Gastrointestinal system: Abdomen is nondistended, soft and nontender.  Central nervous system: Alert and oriented. No focal neurological deficits. Extremities: no LEE  Data Reviewed: I have personally reviewed following labs and imaging studies  CBC: Recent Labs  Lab 07/06/23 1606 07/06/23 1916 07/07/23  0409  WBC 16.0* 15.5* 15.5*  NEUTROABS 13.1*  --   --   HGB 17.2* 15.9* 14.7  HCT 53.8* 50.3* 47.2*  MCV 97.1 98.8 98.7  PLT 269 234 241    Basic Metabolic Panel: Recent Labs  Lab 07/06/23 1606 07/06/23 1916 07/06/23 2251 07/07/23 0409  NA 138  --  132* 136  K 4.2  --  4.2 3.7  CL 98  --  98 101  CO2 20*  --  20* 25  GLUCOSE 295*  --  370* 293*  BUN 13  --  12 9  CREATININE 0.33* 0.54 0.34* <0.30*  CALCIUM  9.0  --  8.1* 8.4*    GFR: CrCl cannot be calculated (This lab value cannot be used to calculate CrCl because it is not Shikita Vaillancourt number: <0.30).  Liver Function Tests: Recent Labs   Lab 07/06/23 1606  AST 12*  ALT 16  ALKPHOS 76  BILITOT 1.5*  PROT 7.4  ALBUMIN 3.3*    CBG: Recent Labs  Lab 07/06/23 2040 07/07/23 0754 07/07/23 1004  GLUCAP 340* 346* 132*     Recent Results (from the past 240 hours)  Blood Culture (routine x 2)     Status: None (Preliminary result)   Collection Time: 07/06/23  4:06 PM   Specimen: BLOOD RIGHT ARM  Result Value Ref Range Status   Specimen Description   Final    BLOOD RIGHT ARM Performed at [facility], [address], [place], [place] [address]    Special Requests   Final    BOTTLES DRAWN AEROBIC AND ANAEROBIC Blood Culture results may not be optimal due to an inadequate volume of blood received in culture bottles Performed at [facility], [address], [place], [place] [address]    Culture   Final    NO GROWTH < 12 HOURS Performed at [facility], [address], [place], [place] [address]    Report Status PENDING  Incomplete  Blood Culture (routine x 2)     Status: None (Preliminary result)   Collection Time: 07/06/23  4:09 PM   Specimen: BLOOD LEFT ARM  Result Value Ref Range Status   Specimen Description   Final    BLOOD LEFT ARM Performed at [facility], [address] [place], [place] [address]    Special Requests   Final    BOTTLES DRAWN AEROBIC AND ANAEROBIC Blood Culture results may not be optimal due to an inadequate volume of blood received in culture bottles Performed at [facility], [address] [place], [place] [address]    Culture   Final    NO GROWTH < 12 HOURS Performed at [facility], [address], [place], [place] [address]    Report Status PENDING  Incomplete  Resp panel by RT-PCR (RSV, Flu Berry Godsey&B, Covid) Anterior Nasal Swab     Status: None   Collection Time: 07/06/23 10:52 PM   Specimen: Anterior Nasal Swab  Result Value Ref Range Status   SARS Coronavirus 2 by RT PCR NEGATIVE NEGATIVE Final    Comment:  (NOTE) SARS-CoV-2 target nucleic acids are NOT DETECTED.  The SARS-CoV-2 RNA is generally detectable in upper respiratory specimens during the acute phase of infection. The lowest concentration of SARS-CoV-2 viral copies this assay can detect is 138 copies/mL. Arra Connaughton negative result does not preclude SARS-Cov-2 infection and should not be used as the sole basis for treatment or other patient management decisions.  Avrum Kimball negative result may occur with  improper specimen collection/handling, submission of specimen other than nasopharyngeal swab, presence of viral mutation(s) within the areas targeted by this assay, and inadequate number of viral copies(<138 copies/mL). Shaely Gadberry negative result must be combined with clinical observations, patient history, and epidemiological information. The expected result is Negative.  Fact Sheet for Patients:  bloggercourse.com  Fact Sheet for Healthcare Providers:  seriousbroker.it  This test is no t yet approved or cleared by the [place]  FDA and  has been authorized for detection and/or diagnosis of SARS-CoV-2 by FDA under an Emergency Use Authorization (EUA). This EUA will remain  in effect (meaning this test can be used) for the duration of the COVID-19 declaration under Section 564(b)(1) of the Act, 21 U.S.C.section 360bbb-3(b)(1), unless the authorization is terminated  or revoked sooner.       Influenza Caitlen Worth by PCR NEGATIVE NEGATIVE Final   Influenza B by PCR NEGATIVE NEGATIVE Final    Comment: (NOTE) The Xpert Xpress SARS-CoV-2/FLU/RSV plus assay is intended as an aid in the diagnosis of influenza from Nasopharyngeal swab specimens and should not be used as Yanis Juma sole basis for treatment. Nasal washings and aspirates are unacceptable for Xpert Xpress SARS-CoV-2/FLU/RSV testing.  Fact Sheet for Patients: bloggercourse.com  Fact Sheet for Healthcare  Providers: seriousbroker.it  This test is not yet approved or cleared by the [place]  FDA and has been authorized for detection and/or diagnosis of SARS-CoV-2 by FDA under an Emergency Use Authorization (EUA). This EUA will remain in effect (meaning this test can be used) for the duration of the COVID-19 declaration under Section 564(b)(1) of the Act, 21 U.S.C. section 360bbb-3(b)(1), unless the authorization is terminated or revoked.     Resp Syncytial Virus by PCR NEGATIVE NEGATIVE Final    Comment: (NOTE) Fact Sheet for Patients: bloggercourse.com  Fact Sheet for Healthcare Providers: seriousbroker.it  This test is not yet approved or cleared by the [place]  FDA and has been authorized for detection and/or diagnosis of SARS-CoV-2 by FDA under an Emergency Use Authorization (EUA). This EUA will remain in effect (meaning this test can be used) for the duration of the COVID-19 declaration under Section 564(b)(1) of the Act, 21 U.S.C. section 360bbb-3(b)(1), unless the authorization is terminated or revoked.  Performed at [facility], [address], [place], [place] [address]          Radiology Studies: [facility] Chest Port 1 View Result Date: 07/06/2023 CLINICAL DATA:  Question of sepsis to evaluate for abnormality. Weakness. Flu-like symptoms for 1 week. Decreased oxygenation. Nausea and diarrhea. Former smoker. EXAM: PORTABLE CHEST 1 VIEW COMPARISON:  03/14/2023 FINDINGS: Heart size and pulmonary vascularity are normal. New area of rounded masslike consolidation in the left mid lung measuring 3.6 cm diameter. This could represent Min Tunnell pulmonary mass or focal pneumonia. CT suggested for further evaluation. Right lung is clear. No pleural effusions. No pneumothorax. Mediastinal contours appear intact. IMPRESSION: Mass or focal consolidation in the left mid lung. CT suggested for  further evaluation. Electronically Signed   By: Elsie Gravely M.D.   On: 07/06/2023 17:04        Scheduled Meds:  enoxaparin  (LOVENOX ) injection  30 mg Subcutaneous Q24H   insulin  aspart  0-5 Units Subcutaneous QHS   insulin  aspart  0-9 Units Subcutaneous TID WC   insulin  aspart  8 Units Subcutaneous TID WC   insulin  glargine-yfgn  12 Units Subcutaneous Daily   sodium chloride  flush  3 mL Intravenous Q12H  Continuous Infusions:  azithromycin      cefTRIAXone  (ROCEPHIN )  IV       LOS: 1 day    Time spent: over 30 min    Meliton Monte, MD [facility]   To contact the attending provider between 7A-7P or the covering provider during after hours 7P-7A, please log into the web site [email or web address] and access using universal [place] password for that web site. If you do not have the password, please call the hospital operator.  07/07/2023, 10:25 AM

## 2023-07-07 NOTE — TOC Initial Note (Signed)
 Transition of Care [facility]) - Initial/Assessment Note   Patient Details  Name: NESIAH JUMP MRN: [id number] Date of Birth: 03/26/1966  [facility]) CM/SW Contact:    Duwaine GORMAN Aran, LCSW Phone Number: 07/07/2023, 11:35 AM  Clinical Narrative: Patient is currently on 3L/min oxygen. Per chart review, patient was admitted in January 2025 and was living in her [place] at that time. She declined shelter and [facility] resources. Patient may need transportation assistance at discharge. TOC to follow for possible needs.                 Expected Discharge Plan: Home/Self Care (Previous documention indicate patient lives in her [place].) Barriers to Discharge: Continued Medical Work up  Expected Discharge Plan and Services In-house Referral: Clinical Social Work Living arrangements for the past 2 months: Homeless  Prior Living Arrangements/Services Living arrangements for the past 2 months: Homeless Patient language and need for interpreter reviewed:: Yes Do you feel safe going back to the place where you live?: Yes      Need for Family Participation in Patient Care: No (Comment) Care giver support system in place?: Yes (comment) Criminal Activity/Legal Involvement Pertinent to Current Situation/Hospitalization: No - Comment as needed  Activities of Daily Living ADL Screening (condition at time of admission) Independently performs ADLs?: Yes (appropriate for developmental age) Is the patient deaf or have difficulty hearing?: No Does the patient have difficulty seeing, even when wearing glasses/contacts?: No Does the patient have difficulty concentrating, remembering, or making decisions?: No  Emotional Assessment Orientation: : Oriented to Self, Oriented to Place, Oriented to  Time, Oriented to Situation Alcohol / Substance Use: Not Applicable Psych Involvement: No (comment)  Admission diagnosis:  Pneumonia [J18.9] Community acquired pneumonia of left upper lobe of lung [J18.9] Patient  Active Problem List   Diagnosis Date Noted   Community acquired pneumonia of left upper lobe of lung 07/06/2023   Suicidal ideation 06/09/2023   Uncontrolled type 2 diabetes mellitus with hyperglycemia (HCC) 06/06/2023   Insect bites 06/06/2023   Grade I diastolic dysfunction 06/06/2023   Acute respiratory failure with hypoxemia (HCC) 03/15/2023   COPD with acute exacerbation (HCC) 03/14/2023   Acute respiratory failure with hypoxia (HCC) 03/14/2023   Protein-calorie malnutrition, severe 12/31/2022   Atypical chest pain 12/29/2022   Essential hypertension 12/29/2022   DM (diabetes mellitus) (HCC) 12/01/2022   Gastric distention 11/30/2022   Hav (hallux abducto valgus), unspecified laterality 11/04/2022   COPD (chronic obstructive pulmonary disease) (HCC) 01/22/2022   Homeless 01/22/2022   Ulcer of right foot (HCC) 01/22/2022   Tobacco dependence 12/27/2019   Aspiration pneumonia (HCC) 12/10/2019   Paroxysmal atrial flutter (HCC) 12/10/2019   Aspiration into airway 12/10/2019   Fibromyalgia 02/27/2019   Hx of unilateral oophorectomy 01/31/2019   Bipolar affective disorder, current episode hypomanic (HCC) 08/10/2014   Paranoid (HCC) 08/10/2014   PCP:  Patient, No Pcp Per Pharmacy:   [id number] [address], [place] - [address] [place] [address] Phone: [phone] Fax: [phone]  Social Drivers of Health (SDOH) Social History: SDOH Screenings   Food Insecurity: No Food Insecurity (07/06/2023)  Recent Concern: Food Insecurity - Food Insecurity Present (06/06/2023)  Housing: High Risk (07/06/2023)  Transportation Needs: No Transportation Needs (07/07/2023)  Recent Concern: Transportation Needs - Unmet Transportation Needs (06/06/2023)  Utilities: Not At Risk (07/07/2023)  Depression (PHQ2-9): Medium Risk (06/16/2019)  Financial Resource Strain: Not at Risk (05/13/2023)   Received from [facility]  Physical Activity: Not on File (  04/01/2023)   Received  from [facility]  Social Connections: Moderately Integrated (06/06/2023)  Stress: Not on File (04/01/2023)   Received from [facility]  Tobacco Use: High Risk (06/24/2023)   Received from [facility]   SDOH Interventions:    Readmission Risk Interventions    03/17/2023    1:13 PM  Readmission Risk Prevention Plan  Transportation Screening Complete  PCP or Specialist Appt within 5-7 Days Complete  Home Care Screening Complete  Medication Review (RN CM) Complete

## 2023-07-08 ENCOUNTER — Inpatient Hospital Stay (HOSPITAL_COMMUNITY): Payer: 59

## 2023-07-08 DIAGNOSIS — J449 Chronic obstructive pulmonary disease, unspecified: Secondary | ICD-10-CM | POA: Diagnosis not present

## 2023-07-08 DIAGNOSIS — R9431 Abnormal electrocardiogram [ECG] [EKG]: Secondary | ICD-10-CM

## 2023-07-08 DIAGNOSIS — A419 Sepsis, unspecified organism: Secondary | ICD-10-CM

## 2023-07-08 DIAGNOSIS — Z720 Tobacco use: Secondary | ICD-10-CM

## 2023-07-08 DIAGNOSIS — J189 Pneumonia, unspecified organism: Secondary | ICD-10-CM | POA: Diagnosis not present

## 2023-07-08 LAB — COMPREHENSIVE METABOLIC PANEL
ALT: 11 U/L (ref 0–44)
AST: 10 U/L — ABNORMAL LOW (ref 15–41)
Albumin: 2.1 g/dL — ABNORMAL LOW (ref 3.5–5.0)
Alkaline Phosphatase: 63 U/L (ref 38–126)
Anion gap: 6 (ref 5–15)
BUN: 14 mg/dL (ref 6–20)
CO2: 28 mmol/L (ref 22–32)
Calcium: 8.1 mg/dL — ABNORMAL LOW (ref 8.9–10.3)
Chloride: 96 mmol/L — ABNORMAL LOW (ref 98–111)
Creatinine, Ser: 0.33 mg/dL — ABNORMAL LOW (ref 0.44–1.00)
GFR, Estimated: >60 mL/min (ref >60)
Glucose, Bld: 259 mg/dL — ABNORMAL HIGH (ref 70–99)
Potassium: 3.5 mmol/L (ref 3.5–5.1)
Sodium: 130 mmol/L — ABNORMAL LOW (ref 135–145)
Total Bilirubin: 0.5 mg/dL (ref 0.0–1.2)
Total Protein: 5.2 g/dL — ABNORMAL LOW (ref 6.5–8.1)

## 2023-07-08 LAB — CBC WITH DIFFERENTIAL/PLATELET
Abs Immature Granulocytes: 0.13 10*3/uL — ABNORMAL HIGH (ref 0.00–0.07)
Basophils Absolute: 0.1 10*3/uL (ref 0.0–0.1)
Basophils Relative: 0 %
Eosinophils Absolute: 0.1 10*3/uL (ref 0.0–0.5)
Eosinophils Relative: 1 %
HCT: 45 % (ref 36.0–46.0)
Hemoglobin: 14.1 g/dL (ref 12.0–15.0)
Immature Granulocytes: 1 %
Lymphocytes Relative: 10 %
Lymphs Abs: 1.2 10*3/uL (ref 0.7–4.0)
MCH: 30.9 pg (ref 26.0–34.0)
MCHC: 31.3 g/dL (ref 30.0–36.0)
MCV: 98.5 fL (ref 80.0–100.0)
Monocytes Absolute: 1.1 10*3/uL — ABNORMAL HIGH (ref 0.1–1.0)
Monocytes Relative: 10 %
Neutro Abs: 8.8 10*3/uL — ABNORMAL HIGH (ref 1.7–7.7)
Neutrophils Relative %: 78 %
Platelets: 268 10*3/uL (ref 150–400)
RBC: 4.57 MIL/uL (ref 3.87–5.11)
RDW: 12.7 % (ref 11.5–15.5)
WBC: 11.4 10*3/uL — ABNORMAL HIGH (ref 4.0–10.5)
nRBC: 0 % (ref 0.0–0.2)

## 2023-07-08 LAB — RESPIRATORY PANEL BY PCR

## 2023-07-08 LAB — ECHOCARDIOGRAM COMPLETE
AR max vel: 2.03 cm2
AV Area VTI: 2.07 cm2
AV Area mean vel: 2.01 cm2
AV Mean grad: 3 mm[Hg]
AV Peak grad: 5.7 mm[Hg]
Ao pk vel: 1.2 m/s
Area-P 1/2: 3.68 cm2
Height: 63 in
S' Lateral: 2.4 cm
Weight: 1387.2 [oz_av]

## 2023-07-08 LAB — MAGNESIUM: Magnesium: 1.8 mg/dL (ref 1.7–2.4)

## 2023-07-08 LAB — PHOSPHORUS: Phosphorus: 1.9 mg/dL — ABNORMAL LOW (ref 2.5–4.6)

## 2023-07-08 LAB — GLUCOSE, CAPILLARY
Glucose-Capillary: 244 mg/dL — ABNORMAL HIGH (ref 70–99)
Glucose-Capillary: 292 mg/dL — ABNORMAL HIGH (ref 70–99)
Glucose-Capillary: 224 mg/dL — ABNORMAL HIGH (ref 70–99)
Glucose-Capillary: 150 mg/dL — ABNORMAL HIGH (ref 70–99)

## 2023-07-08 MED ORDER — KETOROLAC TROMETHAMINE 15 MG/ML IJ SOLN
15.0000 mg | Freq: Once | INTRAMUSCULAR | Status: AC
  Administered 2023-07-08: 15 mg via INTRAVENOUS
  Filled 2023-07-08: qty 1

## 2023-07-08 MED ORDER — HYDRALAZINE HCL 20 MG/ML IJ SOLN
5.0000 mg | Freq: Four times a day (QID) | INTRAMUSCULAR | Status: AC | PRN
  Administered 2023-07-08 – 2023-07-10 (×2): 5 mg via INTRAVENOUS
  Filled 2023-07-08 (×2): qty 1

## 2023-07-08 MED ORDER — ACETAMINOPHEN 325 MG PO TABS
650.0000 mg | ORAL_TABLET | Freq: Four times a day (QID) | ORAL | Status: AC | PRN
  Administered 2023-07-13: 650 mg via ORAL
  Filled 2023-07-08: qty 2

## 2023-07-08 MED ORDER — SALINE SPRAY 0.65 % NA SOLN: 1.0000 | NASAL | Status: DC | PRN

## 2023-07-08 MED ORDER — ACETAMINOPHEN 500 MG PO TABS
1000.0000 mg | ORAL_TABLET | Freq: Three times a day (TID) | ORAL | Status: AC
  Administered 2023-07-08 – 2023-07-12 (×13): 1000 mg via ORAL
  Filled 2023-07-08 (×13): qty 2

## 2023-07-08 MED ORDER — FLUTICASONE PROPIONATE 50 MCG/ACT NA SUSP
2.0000 | Freq: Every day | NASAL | Status: DC
  Administered 2023-07-08 – 2023-07-21 (×13): 2 via NASAL
  Filled 2023-07-08 (×3): qty 16

## 2023-07-08 NOTE — Consult Note (Signed)
 NAME:  KELE WITHEM, MRN:  [id number], DOB:  1965/12/05, LOS: 2 ADMISSION DATE:  07/06/2023, CONSULTATION DATE:  07/08/2023 REFERRING MD:  Perri DELENA Meliton Mickey., *, CHIEF COMPLAINT:  pneumonia   History of Present Illness:  Courtney Bennett is a 58 year old unhoused woman with past medical history of COPD ongoing tobacco use disorder who presents with 2 weeks of worsening cough, shortness of breath, weakness, fatigue.  She is not bringing up significant sputum.  She notes that she has unintentionally lost weight who is 15 pounds over the last few months.  She has night sweats.  She denies hemoptysis.  Pertinent  Medical History  COPD Bipolar disorder, with paranoia and psychotic features Type 1 diabetes mellitus   Significant Hospital Events: Including procedures, antibiotic start and stop dates in addition to other pertinent events     Interim History / Subjective:    Objective   Blood pressure (!) 155/84, pulse 76, temperature 97.8 F (36.6 C), temperature source Oral, resp. rate 16, height 5' 3 (1.6 m), weight 39.3 kg, last menstrual period 11/24/2014, SpO2 96%.        Intake/Output Summary (Last 24 hours) at 07/08/2023 1306 Last data filed at 07/08/2023 1021 Gross per 24 hour  Intake 1681.76 ml  Output 100 ml  Net 1581.76 ml   Filed Weights   07/06/23 1519 07/06/23 1900  Weight: 44 kg 39.3 kg    Examination: General: Frail, cachectic HENT: Dry mucous membranes Lungs: Breath sounds diminished bilaterally, occasional end expiratory wheeze and rhonchi that clear with coughing Cardiovascular: Regular rate and rhythm, no murmur Abdomen: Soft, nontender Extremities: No edema, decreased muscle bulk and tone Neuro: Normal speech, moves all 4 extremities   I reviewed her CT chest which shows multiple patchy airspace opacities with tree-in-bud as well as occasional cavitary thin-walled lesions concerning for multifocal infection  Resolved Hospital Problem list      Assessment & Plan:   Severe sepsis secondary pneumonia Abnormal CT chest -concerning for atypical infection including NTM COPD with ongoing tobacco use disorder Constitutional symptoms including night sweats and weight loss She additionally has risk factors for tuberculosis and we have had an increase in cases in [place]  this year  Okay for empiric community-acquired pneumonia Antibiotic therapy at this time. N.p.o. at midnight She needs a bronchoscopy with transbronchial biopsies to evaluate for atypical infection strep pneumo antigen negative Legionella in process Agree with AFB sputum rule outs and airborne isolation at this time  Plan of care discussed with primary team and patient she is agreeable to proceed with bronchoscopy we reviewed the risks and benefits.  Labs   CBC: Recent Labs  Lab 07/06/23 1606 07/06/23 1916 07/07/23 0409 07/08/23 0417  WBC 16.0* 15.5* 15.5* 11.4*  NEUTROABS 13.1*  --   --  8.8*  HGB 17.2* 15.9* 14.7 14.1  HCT 53.8* 50.3* 47.2* 45.0  MCV 97.1 98.8 98.7 98.5  PLT 269 234 241 268    Basic Metabolic Panel: Recent Labs  Lab 07/06/23 1606 07/06/23 1916 07/06/23 2251 07/07/23 0409 07/08/23 0417  NA 138  --  132* 136 130*  K 4.2  --  4.2 3.7 3.5  CL 98  --  98 101 96*  CO2 20*  --  20* 25 28  GLUCOSE 295*  --  370* 293* 259*  BUN 13  --  12 9 14   CREATININE 0.33* 0.54 0.34* <0.30* 0.33*  CALCIUM  9.0  --  8.1* 8.4* 8.1*  MG  --   --   --   --  1.8  PHOS  --   --   --   --  1.9*   GFR: Estimated Creatinine Clearance: 48.1 mL/min (A) (by C-G formula based on SCr of 0.33 mg/dL (L)). Recent Labs  Lab 07/06/23 1606 07/06/23 1916 07/07/23 0409 07/08/23 0417  WBC 16.0* 15.5* 15.5* 11.4*    Liver Function Tests: Recent Labs  Lab 07/06/23 1606 07/08/23 0417  AST 12* 10*  ALT 16 11  ALKPHOS 76 63  BILITOT 1.5* 0.5  PROT 7.4 5.2*  ALBUMIN 3.3* 2.1*   No results for input(s): LIPASE, AMYLASE in the last 168  hours. No results for input(s): AMMONIA in the last 168 hours.  ABG    Component Value Date/Time   PHART 7.345 (L) 12/10/2019 0836   PCO2ART 45.2 12/10/2019 0836   PO2ART 76 (L) 12/10/2019 0836   HCO3 28.4 (H) 03/14/2023 1353   TCO2 30 06/04/2023 1804   ACIDBASEDEF 1.0 12/10/2019 0836   O2SAT 99 03/14/2023 1353     Coagulation Profile: Recent Labs  Lab 07/06/23 1606 07/07/23 0409  INR 1.0 1.0    Cardiac Enzymes: No results for input(s): CKTOTAL, CKMB, CKMBINDEX, TROPONINI in the last 168 hours.  HbA1C: Hgb A1c MFr Bld  Date/Time Value Ref Range Status  06/05/2023 02:59 PM 13.1 (H) 4.8 - 5.6 % Final    Comment:    (NOTE)         Prediabetes: 5.7 - 6.4         Diabetes: >6.4         Glycemic control for adults with diabetes: <7.0   11/30/2022 06:41 AM 12.0 (H) 4.8 - 5.6 % Final    Comment:    (NOTE)         Prediabetes: 5.7 - 6.4         Diabetes: >6.4         Glycemic control for adults with diabetes: <7.0     CBG: Recent Labs  Lab 07/07/23 1203 07/07/23 1724 07/07/23 2128 07/08/23 0744 07/08/23 1206  GLUCAP 215* 261* 153* 244* 292*    Review of Systems:   In hpi  Past Medical History:  She,  has a past medical history of Bipolar 1 disorder (HCC), COPD (chronic obstructive pulmonary disease) (HCC), Diabetes mellitus, Fibromyalgia, Grade I diastolic dysfunction (06/06/2023), and Hypertension.   Surgical History:   Past Surgical History:  Procedure Laterality Date   ABDOMINAL HYSTERECTOMY  01/2019   BREAST BIOPSY Right    BREAST BIOPSY Right 02/18/2023   US  RT BREAST BX W LOC DEV 1ST LESION IMG BX SPEC US  GUIDE 02/18/2023 [facility]   CESAREAN SECTION     HERNIA REPAIR       Social History:   reports that she quit smoking about 8 years ago. Her smoking use included cigarettes. She has never used smokeless tobacco. She reports current alcohol use. She reports that she does not use drugs.   Family History:  Her family history  includes Breast cancer in her mother and paternal grandmother; Diabetes in her father.   Allergies Allergies  Allergen Reactions   Nickel Hives, Rash and Other (See Comments)    Rash after ear piercing and navel piercings      Oxycodone -Acetaminophen  Nausea And Vomiting   Metformin  Diarrhea   Other Nausea And Vomiting and Other (See Comments)    Opiates- They make me very sick.   Elemental Sulfur Hives, Rash and Other (See Comments)    Burns the skin, also (and, had  a flu-like reaction   Lamictal [Lamotrigine] Rash   Latex Itching and Rash   Sulfa Antibiotics Rash   Tape Rash and Other (See Comments)    Irritates the skin     Home Medications  Prior to Admission medications   Medication Sig Start Date End Date Taking? Authorizing Provider  albuterol  (VENTOLIN  HFA) 108 (90 Base) MCG/ACT inhaler Inhale 1-2 puffs into the lungs every 6 (six) hours as needed. Patient taking differently: Inhale 1-2 puffs into the lungs every 6 (six) hours as needed for wheezing or shortness of breath. 06/09/23  Yes Pokhrel, Laxman, MD  aspirin  EC 81 MG tablet Take 1 tablet (81 mg total) by mouth daily. Swallow whole. Patient taking differently: Take 81 mg by mouth daily as needed for mild pain (pain score 1-3) (or headaches- swallow whole). 06/09/23  Yes Pokhrel, Laxman, MD  HUMALOG  KWIKPEN 200 UNIT/ML KwikPen Inject 8 Units into the skin 3 (three) times daily with meals. 8 units 3 times per day before meals + correctional insulin  MDD 50 units per day Subcutaneous for 30 days 06/09/23 07/09/23 Yes Pokhrel, Laxman, MD  Pseudoephedrine -APAP-DM (DAYQUIL MULTI-SYMPTOM PO) Take 1 capsule by mouth every 6 (six) hours as needed (for cold-like symptoms).   Yes [provider]  Throat Lozenges (COUGH DROPS MENTHOL ) LOZG Use as directed 1 lozenge in the mouth or throat every 2 (two) hours as needed (for a sore throat).   Yes [provider]  umeclidinium bromide  (INCRUSE ELLIPTA ) 62.5 MCG/ACT AEPB  Inhale 1 puff into the lungs daily. 03/22/23  Yes Jillian Buttery, MD  amLODipine  (NORVASC ) 10 MG tablet Take 1 tablet (10 mg total) by mouth daily. Patient not taking: Reported on 07/07/2023 06/09/23   Sonjia Held, MD  Continuous Blood Gluc Sensor (FREESTYLE LIBRE 14 DAY SENSOR) MISC 1 application by Subdermal route See admin instructions. Check blood glucose 1-2 times daily. E11.65 Patient not taking: Reported on 07/07/2023 05/11/19   San Shuck K, DO  estrogens , conjugated, (PREMARIN ) 0.3 MG tablet Take 1 tablet (0.3 mg total) by mouth daily. Patient not taking: Reported on 07/07/2023 06/09/23   Pokhrel, Laxman, MD  gabapentin  (NEURONTIN ) 100 MG capsule Take 1 capsule (100 mg total) by mouth 2 (two) times daily. Patient not taking: Reported on 07/07/2023 06/09/23   Pokhrel, Laxman, MD  insulin  degludec (TRESIBA ) 100 UNIT/ML FlexTouch Pen Inject 22 Units into the skin daily. Patient not taking: Reported on 07/07/2023 06/09/23 09/07/23  Sonjia Held, MD  Insulin  Pen Needle 32G X 4 MM MISC Use as directed with insulin  pens 03/22/23   Jillian Buttery, MD  ketoconazole  (NIZORAL ) 2 % shampoo Apply 1 Application topically 2 (two) times a week. Patient not taking: Reported on 07/07/2023 06/10/23   Pokhrel, Laxman, MD  losartan  (COZAAR ) 50 MG tablet Take 1 tablet (50 mg total) by mouth daily. Patient not taking: Reported on 07/07/2023 06/09/23   Pokhrel, Laxman, MD  nitroGLYCERIN  (NITROSTAT ) 0.4 MG SL tablet Place 1 tablet (0.4 mg total) under the tongue every 5 (five) minutes as needed for chest pain. 06/09/23   Sonjia Held, MD     Critical care time:

## 2023-07-08 NOTE — Progress Notes (Signed)
*  PRELIMINARY RESULTS* Echocardiogram 2D Echocardiogram has been performed.  Courtney Bennett 07/08/2023, 9:30 AM

## 2023-07-08 NOTE — Progress Notes (Addendum)
 PROGRESS NOTE    Courtney Bennett  FMW:[id number] DOB: March 23, 1966 DOA: 07/06/2023 PCP: Patient, No Pcp Per  Chief Complaint  Patient presents with   Weakness    Brief Narrative:   58 yo with T1DM presenting with cough and shortness of breath.  She's been admitted with CAP.    Assessment & Plan:   Principal Problem:   Community acquired pneumonia of left upper lobe of lung Active Problems:   DM (diabetes mellitus) (HCC)  Sepsis due to Community Acquired Pneumonia Meets criteria for sepsis with tachycardia, leukocytosis and CXR concerning for pneumonia Continue CAP coverage with ceftriaxone , azithromycin   CT chest with multifocal pneumonia, atypical in etiology Appreciate pulmonology assistance -> planning bronch 2/7 Airborne precautions, AFB smears/culture Follow blood cultures Negative covid, flu, RSV MRSA PCR, sputum cx, urine strep, urine legionella  Pleuritic Chest Pain Suspect due to above process Consider additional workup if change in description of pain   Anasarca Suspect at least partially related to hypoalbuminemia Echo with EF 60-65%, no RWMA, grade 1 diastolic dysfunction Will follow UA  T1DM Currently on basal bolus (sounds like she's not taking appropriately at home) A1c 13.1 06/2023 Will follow   Cachexia Could be from chronically poorly controlled diabetes (she suggests this as the reason) Will follow CT above Needs additional evaluation/workup     DVT prophylaxis: lovenox  Code Status: full Family Communication: none Disposition:   Status is: Inpatient Remains inpatient appropriate because: need for continued inpatient care   Consultants:  none  Procedures:  none  Antimicrobials:  Anti-infectives (From admission, onward)    Start     Dose/Rate Route Frequency Ordered Stop   07/07/23 1600  azithromycin  (ZITHROMAX ) 500 mg in sodium chloride  0.9 % 250 mL IVPB        500 mg 250 mL/hr over 60 Minutes Intravenous Every 24 hours 07/06/23  1748 07/09/23 1559   07/07/23 1600  cefTRIAXone  (ROCEPHIN ) 2 g in sodium chloride  0.9 % 100 mL IVPB        2 g 200 mL/hr over 30 Minutes Intravenous Every 24 hours 07/06/23 1748 07/12/23 1559   07/06/23 1600  cefTRIAXone  (ROCEPHIN ) 2 g in sodium chloride  0.9 % 100 mL IVPB        2 g 200 mL/hr over 30 Minutes Intravenous Once 07/06/23 1555 07/06/23 1655   07/06/23 1600  azithromycin  (ZITHROMAX ) 500 mg in sodium chloride  0.9 % 250 mL IVPB        500 mg 250 mL/hr over 60 Minutes Intravenous  Once 07/06/23 1555 07/06/23 1724       Subjective: Feels abou thte same  Objective: Vitals:   07/07/23 2113 07/07/23 2200 07/08/23 0522 07/08/23 1415  BP: (!) 153/75  (!) 155/84 (!) 144/68  Pulse: 90  76 86  Resp: 14  16 16   Temp: 97.9 F (36.6 C)  97.8 F (36.6 C) 97.7 F (36.5 C)  TempSrc: Oral  Oral Oral  SpO2: 90% 94% 96% 96%  Weight:      Height:        Intake/Output Summary (Last 24 hours) at 07/08/2023 1601 Last data filed at 07/08/2023 1236 Gross per 24 hour  Intake 1461.76 ml  Output 200 ml  Net 1261.76 ml   Filed Weights   07/06/23 1519 07/06/23 1900  Weight: 44 kg 39.3 kg    Examination:  General: No acute distress. Cardiovascular: RRR Lungs: coarse lung sounds worse on R, some transmitted upper airway sounds Neurological: Alert and oriented 3. Moves all extremities  4 with equal strength. Cranial nerves II through XII grossly intact. Extremities: No clubbing or cyanosis. No edema.  Data Reviewed: I have personally reviewed following labs and imaging studies  CBC: Recent Labs  Lab 07/06/23 1606 07/06/23 1916 07/07/23 0409 07/08/23 0417  WBC 16.0* 15.5* 15.5* 11.4*  NEUTROABS 13.1*  --   --  8.8*  HGB 17.2* 15.9* 14.7 14.1  HCT 53.8* 50.3* 47.2* 45.0  MCV 97.1 98.8 98.7 98.5  PLT 269 234 241 268    Basic Metabolic Panel: Recent Labs  Lab 07/06/23 1606 07/06/23 1916 07/06/23 2251 07/07/23 0409 07/08/23 0417  NA 138  --  132* 136 130*  K 4.2  --   4.2 3.7 3.5  CL 98  --  98 101 96*  CO2 20*  --  20* 25 28  GLUCOSE 295*  --  370* 293* 259*  BUN 13  --  12 9 14   CREATININE 0.33* 0.54 0.34* <0.30* 0.33*  CALCIUM  9.0  --  8.1* 8.4* 8.1*  MG  --   --   --   --  1.8  PHOS  --   --   --   --  1.9*    GFR: Estimated Creatinine Clearance: 48.1 mL/min (Courtney Bennett) (by C-G formula based on SCr of 0.33 mg/dL (L)).  Liver Function Tests: Recent Labs  Lab 07/06/23 1606 07/08/23 0417  AST 12* 10*  ALT 16 11  ALKPHOS 76 63  BILITOT 1.5* 0.5  PROT 7.4 5.2*  ALBUMIN 3.3* 2.1*    CBG: Recent Labs  Lab 07/07/23 1203 07/07/23 1724 07/07/23 2128 07/08/23 0744 07/08/23 1206  GLUCAP 215* 261* 153* 244* 292*     Recent Results (from the past 240 hours)  Blood Culture (routine x 2)     Status: None (Preliminary result)   Collection Time: 07/06/23  4:06 PM   Specimen: BLOOD RIGHT ARM  Result Value Ref Range Status   Specimen Description   Final    BLOOD RIGHT ARM Performed at [name], [address] [place], [place] [address]    Special Requests   Final    BOTTLES DRAWN AEROBIC AND ANAEROBIC Blood Culture results may not be optimal due to an inadequate volume of blood received in culture bottles Performed at [facility], [address], [place], [place] [address]    Culture   Final    NO GROWTH 2 DAYS Performed at [facility], [address], [place], [place] [address]    Report Status PENDING  Incomplete  Blood Culture (routine x 2)     Status: None (Preliminary result)   Collection Time: 07/06/23  4:09 PM   Specimen: BLOOD LEFT ARM  Result Value Ref Range Status   Specimen Description   Final    BLOOD LEFT ARM Performed at [facility], [address], [place], [place] [address]    Special Requests   Final    BOTTLES DRAWN AEROBIC AND ANAEROBIC Blood Culture results may not be optimal due to an inadequate volume of blood received in culture bottles Performed at [facility], [address], [place], [place] [address]    Culture   Final    NO GROWTH 2 DAYS Performed at [facility], [address] [place], [place] [address]    Report Status PENDING  Incomplete  Resp panel by RT-PCR (RSV, Flu Jayvion Stefanski&B, Covid) Anterior Nasal Swab     Status: None  Collection Time: 07/06/23 10:52 PM   Specimen: Anterior Nasal Swab  Result Value Ref Range Status   SARS Coronavirus 2 by RT PCR NEGATIVE NEGATIVE Final    Comment: (NOTE) SARS-CoV-2 target nucleic acids are NOT DETECTED.  The SARS-CoV-2 RNA is generally detectable in upper respiratory specimens during the acute phase of infection. The lowest concentration of SARS-CoV-2 viral copies this assay can detect is 138 copies/mL. Leeam Cedrone negative result does not preclude SARS-Cov-2 infection and should not be used as the sole basis for treatment or other patient management decisions. Sarahi Borland negative result may occur with  improper specimen collection/handling, submission of specimen other than nasopharyngeal swab, presence of viral mutation(s) within the areas targeted by this assay, and inadequate number of viral copies(<138 copies/mL). Selah Zelman negative result must be combined with clinical observations, patient history, and epidemiological information. The expected result is Negative.  Fact Sheet for Patients:  bloggercourse.com  Fact Sheet for Healthcare Providers:  seriousbroker.it  This test is no t yet approved or cleared by the [place]  FDA and  has been authorized for detection and/or diagnosis of SARS-CoV-2 by FDA under an Emergency Use Authorization (EUA). This EUA will remain  in effect (meaning this test can be used) for the duration of the COVID-19 declaration under Section 564(b)(1) of the Act, 21 U.S.C.section 360bbb-3(b)(1), unless the authorization is terminated  or revoked sooner.       Influenza Debbora Ang by PCR NEGATIVE NEGATIVE Final   Influenza B  by PCR NEGATIVE NEGATIVE Final    Comment: (NOTE) The Xpert Xpress SARS-CoV-2/FLU/RSV plus assay is intended as an aid in the diagnosis of influenza from Nasopharyngeal swab specimens and should not be used as Marcelo Ickes sole basis for treatment. Nasal washings and aspirates are unacceptable for Xpert Xpress SARS-CoV-2/FLU/RSV testing.  Fact Sheet for Patients: bloggercourse.com  Fact Sheet for Healthcare Providers: seriousbroker.it  This test is not yet approved or cleared by the [place]  FDA and has been authorized for detection and/or diagnosis of SARS-CoV-2 by FDA under an Emergency Use Authorization (EUA). This EUA will remain in effect (meaning this test can be used) for the duration of the COVID-19 declaration under Section 564(b)(1) of the Act, 21 U.S.C. section 360bbb-3(b)(1), unless the authorization is terminated or revoked.     Resp Syncytial Virus by PCR NEGATIVE NEGATIVE Final    Comment: (NOTE) Fact Sheet for Patients: bloggercourse.com  Fact Sheet for Healthcare Providers: seriousbroker.it  This test is not yet approved or cleared by the [place]  FDA and has been authorized for detection and/or diagnosis of SARS-CoV-2 by FDA under an Emergency Use Authorization (EUA). This EUA will remain in effect (meaning this test can be used) for the duration of the COVID-19 declaration under Section 564(b)(1) of the Act, 21 U.S.C. section 360bbb-3(b)(1), unless the authorization is terminated or revoked.  Performed at [facility], [address] [place], [place] [address]   MRSA Next Gen by PCR, Nasal     Status: None   Collection Time: 07/07/23 11:25 AM  Result Value Ref Range Status   MRSA by PCR Next Gen NOT DETECTED NOT DETECTED Final    Comment: (NOTE) The GeneXpert MRSA Assay (FDA approved for NASAL specimens only), is one component of Cincere Zorn  comprehensive MRSA colonization surveillance program. It is not intended to diagnose MRSA infection nor to guide or monitor treatment for MRSA infections. Test performance is not FDA approved in patients less than 50 years old. Performed at [facility], [address] [place], [place] [address]   Respiratory (~20 pathogens) panel by PCR     Status: None   Collection Time: 07/08/23  7:37 AM   Specimen: Nasopharyngeal Swab; Respiratory  Result Value Ref Range Status   Adenovirus NOT DETECTED NOT DETECTED Final   Coronavirus 229E NOT DETECTED NOT DETECTED Final    Comment: (NOTE) The Coronavirus on the Respiratory Panel, DOES NOT test for the novel  Coronavirus (2019 nCoV)    Coronavirus HKU1 NOT DETECTED NOT DETECTED Final   Coronavirus NL63 NOT DETECTED NOT DETECTED Final   Coronavirus OC43 NOT DETECTED NOT DETECTED Final   Metapneumovirus NOT DETECTED NOT DETECTED Final   Rhinovirus / Enterovirus NOT DETECTED NOT DETECTED Final   Influenza Maayan Jenning NOT DETECTED NOT DETECTED Final   Influenza B NOT DETECTED NOT DETECTED Final   Parainfluenza Virus 1 NOT DETECTED NOT DETECTED Final   Parainfluenza Virus 2 NOT DETECTED NOT DETECTED Final   Parainfluenza Virus 3 NOT DETECTED NOT DETECTED Final   Parainfluenza Virus 4 NOT DETECTED NOT DETECTED Final   Respiratory Syncytial Virus NOT DETECTED NOT DETECTED Final   Bordetella pertussis NOT DETECTED NOT DETECTED Final   Bordetella Parapertussis NOT DETECTED NOT DETECTED Final   Chlamydophila pneumoniae NOT DETECTED NOT DETECTED Final   Mycoplasma pneumoniae NOT DETECTED NOT DETECTED Final    Comment: Performed at [facility], [address] [place], [place] [address]         Radiology Studies: ECHOCARDIOGRAM COMPLETE Result Date: 07/08/2023    ECHOCARDIOGRAM REPORT   Patient Name:   Courtney Bennett Date of Exam: 07/08/2023 Medical Rec #:  [id number]       Height:       63.0 in Accession #:    [id number]      Weight:        86.7 lb Date of Birth:  [birth year]       BSA:          1.355 m Patient Age:    57 years        BP:           121/81 mmHg Patient Gender: F               HR:           80 bpm. Exam Location:  Inpatient Procedure: 2D Echo, Cardiac Doppler and Color Doppler Indications:    Abnormal ECG  History:        Patient has prior history of Echocardiogram examinations, most                 recent 12/29/2022. Signs/Symptoms:Shortness of Breath and                 Dyspnea; Risk Factors:Hypertension, Diabetes and Former Smoker.  Sonographer:    Tillman Nora RVT RCS Referring Phys: [phone] Ayodele Hartsock CALDWELL POWELL JR IMPRESSIONS  1. Left ventricular ejection fraction, by estimation, is 60 to 65%. The left ventricle has normal function. The left ventricle has no regional wall motion abnormalities. Left ventricular diastolic parameters are consistent with Grade I diastolic dysfunction (impaired relaxation).  2. Right ventricular systolic function is normal. The right ventricular size is normal. There is normal pulmonary artery systolic pressure.  3. There is no evidence of cardiac tamponade.  4. The mitral valve is normal in structure. Trivial mitral valve regurgitation. No evidence of mitral stenosis.  5. The aortic valve is normal in structure. Aortic valve regurgitation is not visualized. No  aortic stenosis is present.  6. The inferior vena cava is normal in size with greater than 50% respiratory variability, suggesting right atrial pressure of 3 mmHg. FINDINGS  Left Ventricle: Left ventricular ejection fraction, by estimation, is 60 to 65%. The left ventricle has normal function. The left ventricle has no regional wall motion abnormalities. The left ventricular internal cavity size was normal in size. There is  no left ventricular hypertrophy. Left ventricular diastolic parameters are consistent with Grade I diastolic dysfunction (impaired relaxation). Right Ventricle: The right ventricular size is normal. No increase in right  ventricular wall thickness. Right ventricular systolic function is normal. There is normal pulmonary artery systolic pressure. The tricuspid regurgitant velocity is 1.98 m/s, and  with an assumed right atrial pressure of 3 mmHg, the estimated right ventricular systolic pressure is 18.7 mmHg. Left Atrium: Left atrial size was normal in size. Right Atrium: Right atrial size was normal in size. Pericardium: There is no evidence of pericardial effusion. The pericardial effusion is anterior to the right ventricle. There is no evidence of cardiac tamponade. Mitral Valve: The mitral valve is normal in structure. Trivial mitral valve regurgitation. No evidence of mitral valve stenosis. Tricuspid Valve: The tricuspid valve is normal in structure. Tricuspid valve regurgitation is trivial. No evidence of tricuspid stenosis. Aortic Valve: The aortic valve is normal in structure. Aortic valve regurgitation is not visualized. No aortic stenosis is present. Aortic valve mean gradient measures 3.0 mmHg. Aortic valve peak gradient measures 5.7 mmHg. Aortic valve area, by VTI measures 2.07 cm. Pulmonic Valve: The pulmonic valve was normal in structure. Pulmonic valve regurgitation is not visualized. No evidence of pulmonic stenosis. Aorta: The aortic root is normal in size and structure. Venous: The inferior vena cava is normal in size with greater than 50% respiratory variability, suggesting right atrial pressure of 3 mmHg. IAS/Shunts: No atrial level shunt detected by color flow Doppler.  LEFT VENTRICLE PLAX 2D LVIDd:         3.70 cm   Diastology LVIDs:         2.40 cm   LV e' medial:    7.18 cm/s LV PW:         1.10 cm   LV E/e' medial:  10.4 LV IVS:        0.80 cm   LV e' lateral:   7.62 cm/s LVOT diam:     1.80 cm   LV E/e' lateral: 9.8 LV SV:         49 LV SV Index:   36 LVOT Area:     2.54 cm  RIGHT VENTRICLE          IVC RV Basal diam:  2.70 cm  IVC diam: 1.90 cm RV Mid diam:    2.20 cm LEFT ATRIUM             Index         RIGHT ATRIUM          Index LA diam:        3.20 cm 2.36 cm/m   RA Area:     9.63 cm LA Vol (A2C):   39.7 ml 29.29 ml/m  RA Volume:   22.60 ml 16.67 ml/m LA Vol (A4C):   34.0 ml 25.08 ml/m LA Biplane Vol: 41.9 ml 30.91 ml/m  AORTIC VALVE                    PULMONIC VALVE AV Area (Vmax):    2.03 cm  PV Vmax:       0.70 m/s AV Area (Vmean):   2.01 cm     PV Peak grad:  1.9 mmHg AV Area (VTI):     2.07 cm AV Vmax:           119.50 cm/s AV Vmean:          82.400 cm/s AV VTI:            0.238 m AV Peak Grad:      5.7 mmHg AV Mean Grad:      3.0 mmHg LVOT Vmax:         95.50 cm/s LVOT Vmean:        65.100 cm/s LVOT VTI:          0.193 m LVOT/AV VTI ratio: 0.81  AORTA Ao Root diam: 3.20 cm MITRAL VALVE               TRICUSPID VALVE MV Area (PHT): 3.68 cm    TR Peak grad:   15.7 mmHg MV Decel Time: 206 msec    TR Vmax:        198.00 cm/s MV E velocity: 74.60 cm/s MV Anvay Tennis velocity: 75.40 cm/s  SHUNTS MV E/Phelix Fudala ratio:  0.99        Systemic VTI:  0.19 m                            Systemic Diam: 1.80 cm Kardie Tobb DO Electronically signed by Dub Huntsman DO Signature Date/Time: 07/08/2023/12:15:13 PM    Final    CT CHEST W CONTRAST Result Date: 07/07/2023 CLINICAL DATA:  Respiratory illness. EXAM: CT CHEST WITH CONTRAST TECHNIQUE: Multidetector CT imaging of the chest was performed during intravenous contrast administration. RADIATION DOSE REDUCTION: This exam was performed according to the departmental dose-optimization program which includes automated exposure control, adjustment of the mA and/or kV according to patient size and/or use of iterative reconstruction technique. CONTRAST:  75mL OMNIPAQUE  IOHEXOL  300 MG/ML  SOLN COMPARISON:  Chest radiograph dated 07/06/2023 and CT dated 11/29/2022. FINDINGS: Cardiovascular: There is no cardiomegaly. Small pericardial effusion. There is 2 vessel coronary vascular calcification. Mild atherosclerotic calcification of the thoracic aorta. No aneurysmal dilatation or dissection.  The origins of the great vessels of the aortic arch appear patent. No pulmonary artery embolus identified. Mediastinum/Nodes: Mildly enlarged bilateral hilar and mediastinal lymph nodes measures 12 mm short axis in the right hilum. Kelliann Pendergraph cluster of lymph nodes anterior to the carina measures 12 mm short axis. The esophagus is grossly unremarkable. There is diffuse mediastinal edema. No fluid collection. Multiple thyroid  nodules as seen previously. This has been evaluated on previous imaging. (ref: J Am Coll Radiol. 2015 Feb;12(2): 143-50). Lungs/Pleura: Diffuse clusters of nodular densities. Several upper lobe predominant cavitary nodules noted. Areas of consolidation in the lingula and right lung base. There is no pleural effusion or pneumothorax. The central airways are patent. Upper Abdomen: No acute abnormality. Musculoskeletal: There is diffuse subcutaneous edema and anasarca. No acute osseous pathology. IMPRESSION: 1. Multifocal pneumonia, likely atypical in etiology and may represent fungal infection, TB, or septic emboli. Clinical correlation is recommended. 2. Mildly enlarged bilateral hilar and mediastinal lymph nodes, likely reactive. 3. Diffuse subcutaneous edema and anasarca. 4.  Aortic Atherosclerosis (ICD10-I70.0). Electronically Signed   By: Vanetta Chou M.D.   On: 07/07/2023 15:39   DG Chest Port 1 View Result Date: 07/06/2023 CLINICAL DATA:  Question of sepsis to evaluate for  abnormality. Weakness. Flu-like symptoms for 1 week. Decreased oxygenation. Nausea and diarrhea. Former smoker. EXAM: PORTABLE CHEST 1 VIEW COMPARISON:  03/14/2023 FINDINGS: Heart size and pulmonary vascularity are normal. New area of rounded masslike consolidation in the left mid lung measuring 3.6 cm diameter. This could represent Lenni Reckner pulmonary mass or focal pneumonia. CT suggested for further evaluation. Right lung is clear. No pleural effusions. No pneumothorax. Mediastinal contours appear intact. IMPRESSION: Mass or focal  consolidation in the left mid lung. CT suggested for further evaluation. Electronically Signed   By: Elsie Gravely M.D.   On: 07/06/2023 17:04        Scheduled Meds:  acetaminophen   1,000 mg Oral Q8H   enoxaparin  (LOVENOX ) injection  30 mg Subcutaneous Q24H   fluticasone   2 spray Each Nare Daily   insulin  aspart  0-5 Units Subcutaneous QHS   insulin  aspart  0-9 Units Subcutaneous TID WC   insulin  aspart  8 Units Subcutaneous TID WC   insulin  glargine-yfgn  12 Units Subcutaneous Daily   sodium chloride  flush  3 mL Intravenous Q12H   Continuous Infusions:  azithromycin  500 mg (07/07/23 1742)   cefTRIAXone  (ROCEPHIN )  IV 2 g (07/07/23 1655)     LOS: 2 days    Time spent: over 30 min    Meliton Monte, MD [facility]   To contact the attending provider between 7A-7P or the covering provider during after hours 7P-7A, please log into the web site [email or web address] and access using universal [place] password for that web site. If you do not have the password, please call the hospital operator.  07/08/2023, 4:01 PM

## 2023-07-08 NOTE — Plan of Care (Signed)
  Problem: Education: Goal: Ability to describe self-care measures that may prevent or decrease complications (Diabetes Survival Skills Education) will improve Outcome: Progressing Goal: Individualized Educational Video(s) Outcome: Progressing   Problem: Coping: Goal: Ability to adjust to condition or change in health will improve Outcome: Progressing   Problem: Fluid Volume: Goal: Ability to maintain a balanced intake and output will improve Outcome: Progressing   Problem: Health Behavior/Discharge Planning: Goal: Ability to identify and utilize available resources and services will improve Outcome: Progressing Goal: Ability to manage health-related needs will improve Outcome: Progressing   Problem: Metabolic: Goal: Ability to maintain appropriate glucose levels will improve Outcome: Progressing   Problem: Nutritional: Goal: Maintenance of adequate nutrition will improve Outcome: Progressing Goal: Progress toward achieving an optimal weight will improve Outcome: Progressing   Problem: Skin Integrity: Goal: Risk for impaired skin integrity will decrease Outcome: Progressing   Problem: Tissue Perfusion: Goal: Adequacy of tissue perfusion will improve Outcome: Progressing   Problem: Education: Goal: Knowledge of General Education information will improve Description: Including pain rating scale, medication(s)/side effects and non-pharmacologic comfort measures Outcome: Progressing   Problem: Health Behavior/Discharge Planning: Goal: Ability to manage health-related needs will improve Outcome: Progressing   Problem: Clinical Measurements: Goal: Ability to maintain clinical measurements within normal limits will improve Outcome: Progressing Goal: Diagnostic test results will improve Outcome: Progressing Goal: Respiratory complications will improve Outcome: Progressing Goal: Cardiovascular complication will be avoided Outcome: Progressing   Problem:  Activity: Goal: Risk for activity intolerance will decrease Outcome: Progressing   Problem: Nutrition: Goal: Adequate nutrition will be maintained Outcome: Progressing   Problem: Elimination: Goal: Will not experience complications related to bowel motility Outcome: Progressing Goal: Will not experience complications related to urinary retention Outcome: Progressing   Problem: Pain Managment: Goal: General experience of comfort will improve and/or be controlled Outcome: Progressing   Problem: Safety: Goal: Ability to remain free from injury will improve Outcome: Progressing   Problem: Skin Integrity: Goal: Risk for impaired skin integrity will decrease Outcome: Progressing   Problem: Fluid Volume: Goal: Hemodynamic stability will improve Outcome: Progressing   Problem: Clinical Measurements: Goal: Diagnostic test results will improve Outcome: Progressing Goal: Signs and symptoms of infection will decrease Outcome: Progressing   Problem: Respiratory: Goal: Ability to maintain adequate ventilation will improve Outcome: Progressing   Problem: Activity: Goal: Ability to tolerate increased activity will improve Outcome: Progressing   Problem: Clinical Measurements: Goal: Ability to maintain a body temperature in the normal range will improve Outcome: Progressing   Problem: Respiratory: Goal: Ability to maintain adequate ventilation will improve Outcome: Progressing Goal: Ability to maintain a clear airway will improve Outcome: Progressing   Problem: Clinical Measurements: Goal: Will remain free from infection Outcome: Not Progressing  Pt has CAP and possible TB infection.  Problem: Coping: Goal: Level of anxiety will decrease Outcome: Not Progressing

## 2023-07-08 NOTE — H&P (View-Only) (Signed)
 NAME:  Courtney Bennett, MRN:  [id number], DOB:  1965/12/05, LOS: 2 ADMISSION DATE:  07/06/2023, CONSULTATION DATE:  07/08/2023 REFERRING MD:  Perri DELENA Meliton Mickey., *, CHIEF COMPLAINT:  pneumonia   History of Present Illness:  Courtney Bennett is a 58 year old unhoused woman with past medical history of COPD ongoing tobacco use disorder who presents with 2 weeks of worsening cough, shortness of breath, weakness, fatigue.  She is not bringing up significant sputum.  She notes that she has unintentionally lost weight who is 15 pounds over the last few months.  She has night sweats.  She denies hemoptysis.  Pertinent  Medical History  COPD Bipolar disorder, with paranoia and psychotic features Type 1 diabetes mellitus   Significant Hospital Events: Including procedures, antibiotic start and stop dates in addition to other pertinent events     Interim History / Subjective:    Objective   Blood pressure (!) 155/84, pulse 76, temperature 97.8 F (36.6 C), temperature source Oral, resp. rate 16, height 5' 3 (1.6 m), weight 39.3 kg, last menstrual period 11/24/2014, SpO2 96%.        Intake/Output Summary (Last 24 hours) at 07/08/2023 1306 Last data filed at 07/08/2023 1021 Gross per 24 hour  Intake 1681.76 ml  Output 100 ml  Net 1581.76 ml   Filed Weights   07/06/23 1519 07/06/23 1900  Weight: 44 kg 39.3 kg    Examination: General: Frail, cachectic HENT: Dry mucous membranes Lungs: Breath sounds diminished bilaterally, occasional end expiratory wheeze and rhonchi that clear with coughing Cardiovascular: Regular rate and rhythm, no murmur Abdomen: Soft, nontender Extremities: No edema, decreased muscle bulk and tone Neuro: Normal speech, moves all 4 extremities   I reviewed her CT chest which shows multiple patchy airspace opacities with tree-in-bud as well as occasional cavitary thin-walled lesions concerning for multifocal infection  Resolved Hospital Problem list      Assessment & Plan:   Severe sepsis secondary pneumonia Abnormal CT chest -concerning for atypical infection including NTM COPD with ongoing tobacco use disorder Constitutional symptoms including night sweats and weight loss She additionally has risk factors for tuberculosis and we have had an increase in cases in [place]  this year  Okay for empiric community-acquired pneumonia Antibiotic therapy at this time. N.p.o. at midnight She needs a bronchoscopy with transbronchial biopsies to evaluate for atypical infection strep pneumo antigen negative Legionella in process Agree with AFB sputum rule outs and airborne isolation at this time  Plan of care discussed with primary team and patient she is agreeable to proceed with bronchoscopy we reviewed the risks and benefits.  Labs   CBC: Recent Labs  Lab 07/06/23 1606 07/06/23 1916 07/07/23 0409 07/08/23 0417  WBC 16.0* 15.5* 15.5* 11.4*  NEUTROABS 13.1*  --   --  8.8*  HGB 17.2* 15.9* 14.7 14.1  HCT 53.8* 50.3* 47.2* 45.0  MCV 97.1 98.8 98.7 98.5  PLT 269 234 241 268    Basic Metabolic Panel: Recent Labs  Lab 07/06/23 1606 07/06/23 1916 07/06/23 2251 07/07/23 0409 07/08/23 0417  NA 138  --  132* 136 130*  K 4.2  --  4.2 3.7 3.5  CL 98  --  98 101 96*  CO2 20*  --  20* 25 28  GLUCOSE 295*  --  370* 293* 259*  BUN 13  --  12 9 14   CREATININE 0.33* 0.54 0.34* <0.30* 0.33*  CALCIUM  9.0  --  8.1* 8.4* 8.1*  MG  --   --   --   --  1.8  PHOS  --   --   --   --  1.9*   GFR: Estimated Creatinine Clearance: 48.1 mL/min (A) (by C-G formula based on SCr of 0.33 mg/dL (L)). Recent Labs  Lab 07/06/23 1606 07/06/23 1916 07/07/23 0409 07/08/23 0417  WBC 16.0* 15.5* 15.5* 11.4*    Liver Function Tests: Recent Labs  Lab 07/06/23 1606 07/08/23 0417  AST 12* 10*  ALT 16 11  ALKPHOS 76 63  BILITOT 1.5* 0.5  PROT 7.4 5.2*  ALBUMIN 3.3* 2.1*   No results for input(s): LIPASE, AMYLASE in the last 168  hours. No results for input(s): AMMONIA in the last 168 hours.  ABG    Component Value Date/Time   PHART 7.345 (L) 12/10/2019 0836   PCO2ART 45.2 12/10/2019 0836   PO2ART 76 (L) 12/10/2019 0836   HCO3 28.4 (H) 03/14/2023 1353   TCO2 30 06/04/2023 1804   ACIDBASEDEF 1.0 12/10/2019 0836   O2SAT 99 03/14/2023 1353     Coagulation Profile: Recent Labs  Lab 07/06/23 1606 07/07/23 0409  INR 1.0 1.0    Cardiac Enzymes: No results for input(s): CKTOTAL, CKMB, CKMBINDEX, TROPONINI in the last 168 hours.  HbA1C: Hgb A1c MFr Bld  Date/Time Value Ref Range Status  06/05/2023 02:59 PM 13.1 (H) 4.8 - 5.6 % Final    Comment:    (NOTE)         Prediabetes: 5.7 - 6.4         Diabetes: >6.4         Glycemic control for adults with diabetes: <7.0   11/30/2022 06:41 AM 12.0 (H) 4.8 - 5.6 % Final    Comment:    (NOTE)         Prediabetes: 5.7 - 6.4         Diabetes: >6.4         Glycemic control for adults with diabetes: <7.0     CBG: Recent Labs  Lab 07/07/23 1203 07/07/23 1724 07/07/23 2128 07/08/23 0744 07/08/23 1206  GLUCAP 215* 261* 153* 244* 292*    Review of Systems:   In hpi  Past Medical History:  She,  has a past medical history of Bipolar 1 disorder (HCC), COPD (chronic obstructive pulmonary disease) (HCC), Diabetes mellitus, Fibromyalgia, Grade I diastolic dysfunction (06/06/2023), and Hypertension.   Surgical History:   Past Surgical History:  Procedure Laterality Date   ABDOMINAL HYSTERECTOMY  01/2019   BREAST BIOPSY Right    BREAST BIOPSY Right 02/18/2023   US  RT BREAST BX W LOC DEV 1ST LESION IMG BX SPEC US  GUIDE 02/18/2023 [facility]MO[facility]   CESAREAN SECTION     HERNIA REPAIR       Social History:   reports that she quit smoking about 8 years ago. Her smoking use included cigarettes. She has never used smokeless tobacco. She reports current alcohol use. She reports that she does not use drugs.   Family History:  Her family history  includes Breast cancer in her mother and paternal grandmother; Diabetes in her father.   Allergies Allergies  Allergen Reactions   Nickel Hives, Rash and Other (See Comments)    Rash after ear piercing and navel piercings      Oxycodone -Acetaminophen  Nausea And Vomiting   Metformin  Diarrhea   Other Nausea And Vomiting and Other (See Comments)    Opiates- They make me very sick.   Elemental Sulfur Hives, Rash and Other (See Comments)    Burns the skin, also (and, had  a flu-like reaction   Lamictal [Lamotrigine] Rash   Latex Itching and Rash   Sulfa Antibiotics Rash   Tape Rash and Other (See Comments)    Irritates the skin     Home Medications  Prior to Admission medications   Medication Sig Start Date End Date Taking? Authorizing Provider  albuterol  (VENTOLIN  HFA) 108 (90 Base) MCG/ACT inhaler Inhale 1-2 puffs into the lungs every 6 (six) hours as needed. Patient taking differently: Inhale 1-2 puffs into the lungs every 6 (six) hours as needed for wheezing or shortness of breath. 06/09/23  Yes Pokhrel, Laxman, MD  aspirin  EC 81 MG tablet Take 1 tablet (81 mg total) by mouth daily. Swallow whole. Patient taking differently: Take 81 mg by mouth daily as needed for mild pain (pain score 1-3) (or headaches- swallow whole). 06/09/23  Yes Pokhrel, Laxman, MD  HUMALOG  KWIKPEN 200 UNIT/ML KwikPen Inject 8 Units into the skin 3 (three) times daily with meals. 8 units 3 times per day before meals + correctional insulin  MDD 50 units per day Subcutaneous for 30 days 06/09/23 07/09/23 Yes Pokhrel, Laxman, MD  Pseudoephedrine -APAP-DM (DAYQUIL MULTI-SYMPTOM PO) Take 1 capsule by mouth every 6 (six) hours as needed (for cold-like symptoms).   Yes [provider]  Throat Lozenges (COUGH DROPS MENTHOL ) LOZG Use as directed 1 lozenge in the mouth or throat every 2 (two) hours as needed (for a sore throat).   Yes [provider]  umeclidinium bromide  (INCRUSE ELLIPTA ) 62.5 MCG/ACT AEPB  Inhale 1 puff into the lungs daily. 03/22/23  Yes Jillian Buttery, MD  amLODipine  (NORVASC ) 10 MG tablet Take 1 tablet (10 mg total) by mouth daily. Patient not taking: Reported on 07/07/2023 06/09/23   Sonjia Held, MD  Continuous Blood Gluc Sensor (FREESTYLE LIBRE 14 DAY SENSOR) MISC 1 application by Subdermal route See admin instructions. Check blood glucose 1-2 times daily. E11.65 Patient not taking: Reported on 07/07/2023 05/11/19   San Shuck K, DO  estrogens , conjugated, (PREMARIN ) 0.3 MG tablet Take 1 tablet (0.3 mg total) by mouth daily. Patient not taking: Reported on 07/07/2023 06/09/23   Pokhrel, Laxman, MD  gabapentin  (NEURONTIN ) 100 MG capsule Take 1 capsule (100 mg total) by mouth 2 (two) times daily. Patient not taking: Reported on 07/07/2023 06/09/23   Pokhrel, Laxman, MD  insulin  degludec (TRESIBA ) 100 UNIT/ML FlexTouch Pen Inject 22 Units into the skin daily. Patient not taking: Reported on 07/07/2023 06/09/23 09/07/23  Sonjia Held, MD  Insulin  Pen Needle 32G X 4 MM MISC Use as directed with insulin  pens 03/22/23   Jillian Buttery, MD  ketoconazole  (NIZORAL ) 2 % shampoo Apply 1 Application topically 2 (two) times a week. Patient not taking: Reported on 07/07/2023 06/10/23   Pokhrel, Laxman, MD  losartan  (COZAAR ) 50 MG tablet Take 1 tablet (50 mg total) by mouth daily. Patient not taking: Reported on 07/07/2023 06/09/23   Pokhrel, Laxman, MD  nitroGLYCERIN  (NITROSTAT ) 0.4 MG SL tablet Place 1 tablet (0.4 mg total) under the tongue every 5 (five) minutes as needed for chest pain. 06/09/23   Sonjia Held, MD     Critical care time:

## 2023-07-09 ENCOUNTER — Inpatient Hospital Stay (HOSPITAL_COMMUNITY): Payer: 59

## 2023-07-09 ENCOUNTER — Encounter (HOSPITAL_COMMUNITY): Payer: Self-pay | Admitting: Internal Medicine

## 2023-07-09 ENCOUNTER — Encounter (HOSPITAL_COMMUNITY)
Admission: EM | Disposition: A | Discharge status: Skilled Nursing Facility | Payer: Self-pay | Source: Home / Self Care | Attending: Family Medicine

## 2023-07-09 DIAGNOSIS — J449 Chronic obstructive pulmonary disease, unspecified: Secondary | ICD-10-CM | POA: Diagnosis not present

## 2023-07-09 DIAGNOSIS — J189 Pneumonia, unspecified organism: Secondary | ICD-10-CM | POA: Diagnosis not present

## 2023-07-09 DIAGNOSIS — I10 Essential (primary) hypertension: Secondary | ICD-10-CM

## 2023-07-09 DIAGNOSIS — A419 Sepsis, unspecified organism: Secondary | ICD-10-CM | POA: Diagnosis not present

## 2023-07-09 DIAGNOSIS — Z87891 Personal history of nicotine dependence: Secondary | ICD-10-CM

## 2023-07-09 DIAGNOSIS — J90 Pleural effusion, not elsewhere classified: Secondary | ICD-10-CM | POA: Diagnosis not present

## 2023-07-09 HISTORY — PX: BRONCHIAL BRUSHINGS: SHX5108

## 2023-07-09 HISTORY — PX: BRONCHIAL BIOPSY: SHX5109

## 2023-07-09 HISTORY — PX: BRONCHIAL WASHINGS: SHX5105

## 2023-07-09 HISTORY — PX: HEMOSTASIS CONTROL: SHX6838

## 2023-07-09 HISTORY — PX: VIDEO BRONCHOSCOPY: SHX5072

## 2023-07-09 LAB — LEGIONELLA PNEUMOPHILA SEROGP 1 UR AG
L. pneumophila Serogp 1 Ur Ag: NEGATIVE
L. pneumophila Serogp 1 Ur Ag: NEGATIVE

## 2023-07-09 LAB — URINALYSIS, ROUTINE W REFLEX MICROSCOPIC
Bilirubin Urine: NEGATIVE
Glucose, UA: 150 mg/dL — AB
Hgb urine dipstick: NEGATIVE
Ketones, ur: NEGATIVE mg/dL
Leukocytes,Ua: NEGATIVE
Nitrite: NEGATIVE
Protein, ur: NEGATIVE mg/dL
Specific Gravity, Urine: 1.027 (ref 1.005–1.030)
pH: 5 (ref 5.0–8.0)

## 2023-07-09 LAB — BODY FLUID CELL COUNT WITH DIFFERENTIAL
Eos, Fluid: 0 %
Lymphs, Fluid: 22 %
Monocyte-Macrophage-Serous Fluid: 16 % — ABNORMAL LOW (ref 50–90)
Neutrophil Count, Fluid: 62 % — ABNORMAL HIGH (ref 0–25)
Total Nucleated Cell Count, Fluid: 75 uL (ref 0–1000)

## 2023-07-09 LAB — GLUCOSE, CAPILLARY
Glucose-Capillary: 195 mg/dL — ABNORMAL HIGH (ref 70–99)
Glucose-Capillary: 126 mg/dL — ABNORMAL HIGH (ref 70–99)
Glucose-Capillary: 143 mg/dL — ABNORMAL HIGH (ref 70–99)
Glucose-Capillary: 266 mg/dL — ABNORMAL HIGH (ref 70–99)

## 2023-07-09 LAB — HIV ANTIBODY (ROUTINE TESTING W REFLEX): HIV Screen 4th Generation wRfx: NONREACTIVE

## 2023-07-09 SURGERY — BRONCHOSCOPY, WITH FLUOROSCOPY
Anesthesia: General

## 2023-07-09 MED ORDER — CHLORHEXIDINE GLUCONATE CLOTH 2 % EX PADS
6.0000 | MEDICATED_PAD | Freq: Every day | CUTANEOUS | Status: DC
  Administered 2023-07-09 – 2023-07-18 (×8): 6 via TOPICAL

## 2023-07-09 MED ORDER — SODIUM CHLORIDE 0.9 % IV SOLN: INTRAVENOUS | Status: AC | PRN

## 2023-07-09 MED ORDER — MORPHINE SULFATE (PF) 2 MG/ML IV SOLN: 2.0000 mg | INTRAVENOUS | Status: DC | PRN

## 2023-07-09 MED ORDER — DEXAMETHASONE SODIUM PHOSPHATE 10 MG/ML IJ SOLN
INTRAMUSCULAR | Status: DC | PRN
  Administered 2023-07-09: 5 mg via INTRAVENOUS

## 2023-07-09 MED ORDER — OXYCODONE HCL 5 MG PO TABS: 5.0000 mg | ORAL_TABLET | ORAL | Status: DC | PRN

## 2023-07-09 MED ORDER — SODIUM CHLORIDE 0.9 % IV SOLN: INTRAVENOUS | Status: DC | PRN

## 2023-07-09 MED ORDER — PROPOFOL 10 MG/ML IV BOLUS
INTRAVENOUS | Status: AC
  Filled 2023-07-09: qty 20

## 2023-07-09 MED ORDER — SODIUM CHLORIDE 0.9% FLUSH
10.0000 mL | Freq: Three times a day (TID) | INTRAVENOUS | Status: DC
  Administered 2023-07-09 – 2023-07-15 (×13): 10 mL via INTRAPLEURAL

## 2023-07-09 MED ORDER — ONDANSETRON HCL 4 MG/2ML IJ SOLN
INTRAMUSCULAR | Status: DC | PRN
  Administered 2023-07-09: 4 mg via INTRAVENOUS

## 2023-07-09 MED ORDER — LIDOCAINE HCL (CARDIAC) PF 100 MG/5ML IV SOSY
PREFILLED_SYRINGE | INTRAVENOUS | Status: DC | PRN
  Administered 2023-07-09: 20 mg via INTRAVENOUS

## 2023-07-09 MED ORDER — METHOCARBAMOL 500 MG PO TABS
500.0000 mg | ORAL_TABLET | Freq: Three times a day (TID) | ORAL | Status: AC | PRN
  Administered 2023-07-09 – 2023-07-10 (×2): 500 mg via ORAL
  Filled 2023-07-09 (×2): qty 1

## 2023-07-09 MED ORDER — KETOROLAC TROMETHAMINE 15 MG/ML IJ SOLN
15.0000 mg | Freq: Three times a day (TID) | INTRAMUSCULAR | Status: DC | PRN
  Administered 2023-07-09 – 2023-07-10 (×3): 15 mg via INTRAVENOUS
  Filled 2023-07-09 (×3): qty 1

## 2023-07-09 MED ORDER — ROCURONIUM BROMIDE 100 MG/10ML IV SOLN
INTRAVENOUS | Status: DC | PRN
  Administered 2023-07-09: 30 mg via INTRAVENOUS
  Administered 2023-07-09: 5 mg via INTRAVENOUS

## 2023-07-09 MED ORDER — PROPOFOL 10 MG/ML IV BOLUS
INTRAVENOUS | Status: DC | PRN
  Administered 2023-07-09: 100 mg via INTRAVENOUS

## 2023-07-09 MED ORDER — SUGAMMADEX SODIUM 200 MG/2ML IV SOLN
INTRAVENOUS | Status: DC | PRN
  Administered 2023-07-09: 160 mg via INTRAVENOUS

## 2023-07-09 MED ORDER — FENTANYL CITRATE (PF) 100 MCG/2ML IJ SOLN
INTRAMUSCULAR | Status: AC
  Filled 2023-07-09: qty 2

## 2023-07-09 MED ORDER — ORAL CARE MOUTH RINSE: 15.0000 mL | OROMUCOSAL | Status: DC | PRN

## 2023-07-09 MED ORDER — BOOST / RESOURCE BREEZE PO LIQD CUSTOM
1.0000 | Freq: Three times a day (TID) | ORAL | Status: DC
  Administered 2023-07-09 – 2023-07-10 (×3): 1 via ORAL

## 2023-07-09 MED ORDER — FENTANYL CITRATE (PF) 100 MCG/2ML IJ SOLN
INTRAMUSCULAR | Status: DC | PRN
  Administered 2023-07-09 (×2): 25 ug via INTRAVENOUS

## 2023-07-09 MED ORDER — SUCCINYLCHOLINE CHLORIDE 200 MG/10ML IV SOSY
PREFILLED_SYRINGE | INTRAVENOUS | Status: DC | PRN
  Administered 2023-07-09: 80 mg via INTRAVENOUS

## 2023-07-09 MED ORDER — ALBUTEROL SULFATE (2.5 MG/3ML) 0.083% IN NEBU
2.5000 mg | INHALATION_SOLUTION | RESPIRATORY_TRACT | Status: DC | PRN
  Administered 2023-07-09: 2.5 mg via RESPIRATORY_TRACT

## 2023-07-09 NOTE — Anesthesia Preprocedure Evaluation (Addendum)
 Anesthesia Evaluation  Patient identified by MRN, date of birth, ID band Patient awake    Reviewed: Allergy & Precautions, NPO status , Patient's Chart, lab work & pertinent test results  Airway Mallampati: II  TM Distance: >3 FB Neck ROM: Full    Dental  (+) Dental Advisory Given, Poor Dentition   Pulmonary pneumonia, unresolved, COPD,  COPD inhaler, Patient abstained from smoking., former smoker   Pulmonary exam normal breath sounds clear to auscultation       Cardiovascular hypertension, Pt. on medications Normal cardiovascular exam+ dysrhythmias Atrial Fibrillation  Rhythm:Regular Rate:Normal  TTE 2025  1. Left ventricular ejection fraction, by estimation, is 60 to 65%. The  left ventricle has normal function. The left ventricle has no regional  wall motion abnormalities. Left ventricular diastolic parameters are  consistent with Grade I diastolic  dysfunction (impaired relaxation).   2. Right ventricular systolic function is normal. The right ventricular  size is normal. There is normal pulmonary artery systolic pressure.   3. There is no evidence of cardiac tamponade.   4. The mitral valve is normal in structure. Trivial mitral valve  regurgitation. No evidence of mitral stenosis.   5. The aortic valve is normal in structure. Aortic valve regurgitation is  not visualized. No aortic stenosis is present.   6. The inferior vena cava is normal in size with greater than 50%  respiratory variability, suggesting right atrial pressure of 3 mmHg.     Neuro/Psych  PSYCHIATRIC DISORDERS   Bipolar Disorder   negative neurological ROS     GI/Hepatic negative GI ROS, Neg liver ROS,,,  Endo/Other  diabetes, Type 1, Insulin  Dependent    Renal/GU negative Renal ROS  negative genitourinary   Musculoskeletal  (+)  Fibromyalgia -  Abdominal   Peds  Hematology negative hematology ROS (+)   Anesthesia Other Findings 58 yo with  T1DM, COPD presenting with cough and shortness of breath admitted for CAP. Rule out TB  Reproductive/Obstetrics                             Anesthesia Physical Anesthesia Plan  ASA: 3  Anesthesia Plan: General   Post-op Pain Management: Minimal or no pain anticipated   Induction: Intravenous and Rapid sequence  PONV Risk Score and Plan: 3 and Midazolam  and Ondansetron   Airway Management Planned: Oral ETT  Additional Equipment:   Intra-op Plan:   Post-operative Plan: Extubation in OR  Informed Consent: I have reviewed the patients History and Physical, chart, labs and discussed the procedure including the risks, benefits and alternatives for the proposed anesthesia with the patient or authorized representative who has indicated his/her understanding and acceptance.     Dental advisory given  Plan Discussed with: CRNA  Anesthesia Plan Comments:        Anesthesia Quick Evaluation

## 2023-07-09 NOTE — Interval H&P Note (Signed)
 History and Physical Interval Note:  07/09/2023 1:33 PM  Courtney Bennett  has presented today for surgery, with the diagnosis of pneumonia.  The various methods of treatment have been discussed with the patient and family. After consideration of risks, benefits and other options for treatment, the patient has consented to  Procedure(s): VIDEO BRONCHOSCOPY WITH FLUORO (N/A) as a surgical intervention.  The patient's history has been reviewed, patient examined, no change in status, stable for surgery.  I have reviewed the patient's chart and labs.  Questions were answered to the patient's satisfaction.     Deshanae Lindo Slater Staff, MD

## 2023-07-09 NOTE — Transfer of Care (Signed)
 Immediate Anesthesia Transfer of Care Note  Patient: Courtney Bennett  Procedure(s) Performed: VIDEO BRONCHOSCOPY WITH FLUORO BRONCHIAL WASHINGS BRONCHIAL BRUSHINGS BRONCHIAL BIOPSIES HEMOSTASIS CONTROL  Patient Location: PACU  Anesthesia Type:General  Level of Consciousness: awake  Airway & Oxygen Therapy: Patient Spontanous Breathing and Patient connected to face mask oxygen  Post-op Assessment: Report given to RN and Post -op Vital signs reviewed and stable  Post vital signs: Reviewed and stable  Last Vitals:  Vitals Value Taken Time  BP    Temp    Pulse    Resp    SpO2      Last Pain:  Vitals:   07/09/23 1332  TempSrc: Temporal  PainSc: 0-No pain      Patients Stated Pain Goal: 0 (07/08/23 0200)  Complications: No notable events documented.

## 2023-07-09 NOTE — Progress Notes (Signed)
 Pt presented to endo today for bronchoscopy with fluoro with MD Kassie. Post-operatively, pt was able to be weaned to 4 LPM by nasal cannula with saturations of 91-93%. Pt was noted to have rhonchi in the upper lung fields and slightly diminished breath sounds in the lower lung fields.  Post-operative CXR was obtained and reviewed by MD Kassie.   Subsequently, between approximately 1450 and 1500, pt began requiring  additional supplemental oxygen to maintain oxygen saturations >92%. Pt began satting 88-90% on 4 LPM by [place] and was subsequently started on 6 LPM by simple mask without improvement. Pt was started on 8 LPM by simple mask with improvement in O2 saturations to 92-94%.   On auscultation at that time, pt had rhonchi and end-expiratory wheezing in the upper lung fields, the lower lung fields remained diminished as per previous assessment.  MD Kassie notified of increased oxygen requirements at approximately 1500. VO for repeat STAT Chest xray.  At that time, pt began complaining of dyspnea, stating I can't breathe. MD Kassie notified of change in pt's status and ordered transfer to ICU. Pt transferred to ICU with MD Kassie at bedside at 1515, report given to Isaiah Sermon, RN.  Courtney VEAR Pouch, RN 07/09/23 3:42 PM

## 2023-07-09 NOTE — Anesthesia Procedure Notes (Signed)
 Procedure Name: Intubation Date/Time: 07/09/2023 1:50 PM  Performed by: Belvie Valri NOVAK, CRNAPre-anesthesia Checklist: Patient identified, Emergency Drugs available, Suction available and Patient being monitored Patient Re-evaluated:Patient Re-evaluated prior to induction Oxygen Delivery Method: Circle System Utilized Preoxygenation: Pre-oxygenation with 100% oxygen Induction Type: IV induction and Rapid sequence Laryngoscope Size: Mac and 3 Grade View: Grade I Tube type: Oral Tube size: 8.0 mm Number of attempts: 1 Airway Equipment and Method: Stylet and Oral airway Placement Confirmation: ETT inserted through vocal cords under direct vision, positive ETCO2 and breath sounds checked- equal and bilateral Secured at: 22 cm Tube secured with: Tape Dental Injury: Teeth and Oropharynx as per pre-operative assessment

## 2023-07-09 NOTE — Procedures (Signed)
 Insertion of Chest Tube Procedure Note  Courtney Bennett  [id number]  12-Jul-1965  Date:07/09/23  Time:8:46 PM    Provider Performing: Merlon Alcorta Slater Staff, MD  Procedure: Chest Tube Insertion [phone]  Indication(s) Pneumothorax  Consent Risks of the procedure as well as the alternatives and risks of each were explained to the patient and/or caregiver.  Consent for the procedure was obtained and is signed in the bedside chart  Anesthesia Topical only with 1% lidocaine  , 7 cc   Time Out Verified patient identification, verified procedure, site/side was marked, verified correct patient position, special equipment/implants available, medications/allergies/relevant history reviewed, required imaging and test results available.   Sterile Technique Maximal sterile technique including full sterile barrier drape, hand hygiene, sterile gown, sterile gloves, mask, hair covering, sterile ultrasound probe cover (if used).   Procedure Description Ultrasound not used to identify appropriate pleural anatomy for placement and overlying skin marked. Area of placement cleaned and draped in sterile fashion.  A 14 French pigtail pleural catheter was placed into the left pleural space using Seldinger technique. Appropriate return of air was obtained.  The tube was connected to atrium and placed on -20 cm H2O wall suction.   Complications/Tolerance None; patient tolerated the procedure well. Chest X-ray is ordered to verify placement.   EBL Minimal  Specimen(s) none

## 2023-07-09 NOTE — Progress Notes (Signed)
 PROGRESS NOTE    Courtney Bennett  FMW:[id number] DOB: March 10, 1966 DOA: 07/06/2023 PCP: Patient, No Pcp Per  Chief Complaint  Patient presents with   Weakness    Brief Narrative:   58 yo with T1DM presenting with cough and shortness of breath.  She's been admitted with CAP.    Assessment & Plan:   Principal Problem:   Community acquired pneumonia of left upper lobe of lung Active Problems:   DM (diabetes mellitus) (HCC)  Sepsis due to Community Acquired Pneumonia Meets criteria for sepsis with tachycardia, leukocytosis and CXR concerning for pneumonia Continue CAP coverage with ceftriaxone , azithromycin   CT chest with multifocal pneumonia, atypical in etiology Appreciate pulmonology assistance -> s/p bronch 2/7, follow results Airborne precautions, AFB smears/culture Follow blood cultures Negative covid, flu, RSV MRSA PCR, sputum cx, urine strep, urine legionella  Post Bronch Pneumothorax S/p chest tube per pulm   Pleuritic Chest Pain Suspect due to above process Consider additional workup if change in description of pain   Anasarca Suspect at least partially related to hypoalbuminemia Echo with EF 60-65%, no RWMA, grade 1 diastolic dysfunction Will follow UA -> negative protein on UA  T1DM Currently on basal bolus (sounds like she's not taking appropriately at home) A1c 13.1 06/2023 Will follow   Cachexia Could be from chronically poorly controlled diabetes (she suggests this as the reason) Will follow CT above Needs additional evaluation/workup     DVT prophylaxis: lovenox  Code Status: full Family Communication: none Disposition:   Status is: Inpatient Remains inpatient appropriate because: need for continued inpatient care   Consultants:  none  Procedures:  none  Antimicrobials:  Anti-infectives (From admission, onward)    Start     Dose/Rate Route Frequency Ordered Stop   07/07/23 1600  azithromycin  (ZITHROMAX ) 500 mg in sodium chloride  0.9 %  250 mL IVPB        500 mg 250 mL/hr over 60 Minutes Intravenous Every 24 hours 07/06/23 1748 07/08/23 1913   07/07/23 1600  cefTRIAXone  (ROCEPHIN ) 2 g in sodium chloride  0.9 % 100 mL IVPB        2 g 200 mL/hr over 30 Minutes Intravenous Every 24 hours 07/06/23 1748 07/12/23 1559   07/06/23 1600  cefTRIAXone  (ROCEPHIN ) 2 g in sodium chloride  0.9 % 100 mL IVPB        2 g 200 mL/hr over 30 Minutes Intravenous Once 07/06/23 1555 07/06/23 1655   07/06/23 1600  azithromycin  (ZITHROMAX ) 500 mg in sodium chloride  0.9 % 250 mL IVPB        500 mg 250 mL/hr over 60 Minutes Intravenous  Once 07/06/23 1555 07/06/23 1724       Subjective: C/o pain   Objective: Vitals:   07/09/23 1530 07/09/23 1541 07/09/23 1600 07/09/23 1700  BP:   (!) 156/85 (!) 180/98  Pulse:  (!) 125 (!) 101 99  Resp:  20 12 18   Temp:  98.1 F (36.7 C)    TempSrc:  Axillary    SpO2: 96% (!) 89% 99% 96%  Weight:  64.5 kg    Height:  5' 3 (1.6 m)      Intake/Output Summary (Last 24 hours) at 07/09/2023 1805 Last data filed at 07/09/2023 1700 Gross per 24 hour  Intake 641.41 ml  Output 10 ml  Net 631.41 ml   Filed Weights   07/06/23 1519 07/06/23 1900 07/09/23 1541  Weight: 44 kg 39.3 kg 64.5 kg    Examination:  General: No acute distress. Cardiovascular: RRR  Lungs: diminished, L chest tube Neurological: Alert and oriented 3. Moves all extremities 4 with equal strength. Cranial nerves II through XII grossly intact. Extremities: No clubbing or cyanosis. No edema.  Data Reviewed: I have personally reviewed following labs and imaging studies  CBC: Recent Labs  Lab 07/06/23 1606 07/06/23 1916 07/07/23 0409 07/08/23 0417  WBC 16.0* 15.5* 15.5* 11.4*  NEUTROABS 13.1*  --   --  8.8*  HGB 17.2* 15.9* 14.7 14.1  HCT 53.8* 50.3* 47.2* 45.0  MCV 97.1 98.8 98.7 98.5  PLT 269 234 241 268    Basic Metabolic Panel: Recent Labs  Lab 07/06/23 1606 07/06/23 1916 07/06/23 2251 07/07/23 0409 07/08/23 0417   NA 138  --  132* 136 130*  K 4.2  --  4.2 3.7 3.5  CL 98  --  98 101 96*  CO2 20*  --  20* 25 28  GLUCOSE 295*  --  370* 293* 259*  BUN 13  --  12 9 14   CREATININE 0.33* 0.54 0.34* <0.30* 0.33*  CALCIUM  9.0  --  8.1* 8.4* 8.1*  MG  --   --   --   --  1.8  PHOS  --   --   --   --  1.9*    GFR: Estimated Creatinine Clearance: 70.1 mL/min ([name]) (by C-G formula based on SCr of 0.33 mg/dL (L)).  Liver Function Tests: Recent Labs  Lab 07/06/23 1606 07/08/23 0417  AST 12* 10*  ALT 16 11  ALKPHOS 76 63  BILITOT 1.5* 0.5  PROT 7.4 5.2*  ALBUMIN 3.3* 2.1*    CBG: Recent Labs  Lab 07/08/23 1727 07/08/23 2116 07/09/23 0736 07/09/23 1159 07/09/23 1649  GLUCAP 224* 150* 195* 126* 143*     Recent Results (from the past 240 hours)  Blood Culture (routine x 2)     Status: None (Preliminary result)   Collection Time: 07/06/23  4:06 PM   Specimen: BLOOD RIGHT ARM  Result Value Ref Range Status   Specimen Description   Final    BLOOD RIGHT ARM Performed at [facility], [address], [place], [place] [address]    Special Requests   Final    BOTTLES DRAWN AEROBIC AND ANAEROBIC Blood Culture results may not be optimal due to an inadequate volume of blood received in culture bottles Performed at [facility], [address] [place], [place] [address]    Culture   Final    NO GROWTH 3 DAYS Performed at [facility], [address], [place], [place] [address]    Report Status PENDING  Incomplete  Blood Culture (routine x 2)     Status: None (Preliminary result)   Collection Time: 07/06/23  4:09 PM   Specimen: BLOOD LEFT ARM  Result Value Ref Range Status   Specimen Description   Final    BLOOD LEFT ARM Performed at [facility], [address] [place], [place] [address]    Special Requests   Final    BOTTLES DRAWN AEROBIC AND ANAEROBIC Blood Culture results may not be optimal due to an inadequate volume of blood received in culture  bottles Performed at [facility], [address], [place], [place] [address]    Culture   Final    NO GROWTH 3 DAYS Performed at [facility], [address] [place], [place] [address]    Report Status PENDING  Incomplete  Resp panel by RT-PCR (  RSV, Flu Nicholaus Steinke&B, Covid) Anterior Nasal Swab     Status: None   Collection Time: 07/06/23 10:52 PM   Specimen: Anterior Nasal Swab  Result Value Ref Range Status   SARS Coronavirus 2 by RT PCR NEGATIVE NEGATIVE Final    Comment: (NOTE) SARS-CoV-2 target nucleic acids are NOT DETECTED.  The SARS-CoV-2 RNA is generally detectable in upper respiratory specimens during the acute phase of infection. The lowest concentration of SARS-CoV-2 viral copies this assay can detect is 138 copies/mL. Alima Naser negative result does not preclude SARS-Cov-2 infection and should not be used as the sole basis for treatment or other patient management decisions. Maycie Luera negative result may occur with  improper specimen collection/handling, submission of specimen other than nasopharyngeal swab, presence of viral mutation(s) within the areas targeted by this assay, and inadequate number of viral copies(<138 copies/mL). Nishika Parkhurst negative result must be combined with clinical observations, patient history, and epidemiological information. The expected result is Negative.  Fact Sheet for Patients:  bloggercourse.com  Fact Sheet for Healthcare Providers:  seriousbroker.it  This test is no t yet approved or cleared by the [place]  FDA and  has been authorized for detection and/or diagnosis of SARS-CoV-2 by FDA under an Emergency Use Authorization (EUA). This EUA will remain  in effect (meaning this test can be used) for the duration of the COVID-19 declaration under Section 564(b)(1) of the Act, 21 U.S.C.section 360bbb-3(b)(1), unless the authorization is terminated  or revoked sooner.       Influenza Analisa Sledd  by PCR NEGATIVE NEGATIVE Final   Influenza B by PCR NEGATIVE NEGATIVE Final    Comment: (NOTE) The Xpert Xpress SARS-CoV-2/FLU/RSV plus assay is intended as an aid in the diagnosis of influenza from Nasopharyngeal swab specimens and should not be used as Harris Kistler sole basis for treatment. Nasal washings and aspirates are unacceptable for Xpert Xpress SARS-CoV-2/FLU/RSV testing.  Fact Sheet for Patients: bloggercourse.com  Fact Sheet for Healthcare Providers: seriousbroker.it  This test is not yet approved or cleared by the [place]  FDA and has been authorized for detection and/or diagnosis of SARS-CoV-2 by FDA under an Emergency Use Authorization (EUA). This EUA will remain in effect (meaning this test can be used) for the duration of the COVID-19 declaration under Section 564(b)(1) of the Act, 21 U.S.C. section 360bbb-3(b)(1), unless the authorization is terminated or revoked.     Resp Syncytial Virus by PCR NEGATIVE NEGATIVE Final    Comment: (NOTE) Fact Sheet for Patients: bloggercourse.com  Fact Sheet for Healthcare Providers: seriousbroker.it  This test is not yet approved or cleared by the [place]  FDA and has been authorized for detection and/or diagnosis of SARS-CoV-2 by FDA under an Emergency Use Authorization (EUA). This EUA will remain in effect (meaning this test can be used) for the duration of the COVID-19 declaration under Section 564(b)(1) of the Act, 21 U.S.C. section 360bbb-3(b)(1), unless the authorization is terminated or revoked.  Performed at [facility], [address] [place], [place] [address]   MRSA Next Gen by PCR, Nasal     Status: None   Collection Time: 07/07/23 11:25 AM  Result Value Ref Range Status   MRSA by PCR Next Gen NOT DETECTED NOT DETECTED Final    Comment: (NOTE) The GeneXpert MRSA Assay (FDA approved for  NASAL specimens only), is one component of Sheridyn Canino comprehensive MRSA colonization surveillance program. It is not intended to diagnose MRSA infection nor to guide or monitor treatment for MRSA infections. Test performance is not FDA  approved in patients less than 71 years old. Performed at [facility], [address], [place], [place] [address]   Respiratory (~20 pathogens) panel by PCR     Status: None   Collection Time: 07/08/23  7:37 AM   Specimen: Nasopharyngeal Swab; Respiratory  Result Value Ref Range Status   Adenovirus NOT DETECTED NOT DETECTED Final   Coronavirus 229E NOT DETECTED NOT DETECTED Final    Comment: (NOTE) The Coronavirus on the Respiratory Panel, DOES NOT test for the novel  Coronavirus (2019 nCoV)    Coronavirus HKU1 NOT DETECTED NOT DETECTED Final   Coronavirus NL63 NOT DETECTED NOT DETECTED Final   Coronavirus OC43 NOT DETECTED NOT DETECTED Final   Metapneumovirus NOT DETECTED NOT DETECTED Final   Rhinovirus / Enterovirus NOT DETECTED NOT DETECTED Final   Influenza Atiyana Welte NOT DETECTED NOT DETECTED Final   Influenza B NOT DETECTED NOT DETECTED Final   Parainfluenza Virus 1 NOT DETECTED NOT DETECTED Final   Parainfluenza Virus 2 NOT DETECTED NOT DETECTED Final   Parainfluenza Virus 3 NOT DETECTED NOT DETECTED Final   Parainfluenza Virus 4 NOT DETECTED NOT DETECTED Final   Respiratory Syncytial Virus NOT DETECTED NOT DETECTED Final   Bordetella pertussis NOT DETECTED NOT DETECTED Final   Bordetella Parapertussis NOT DETECTED NOT DETECTED Final   Chlamydophila pneumoniae NOT DETECTED NOT DETECTED Final   Mycoplasma pneumoniae NOT DETECTED NOT DETECTED Final    Comment: Performed at [facility], [address], [place], [place] [address]         Radiology Studies: DG CHEST PORT 1 VIEW Result Date: 07/09/2023 CLINICAL DATA:  Status post bronchoscopy EXAM: PORTABLE CHEST 1 VIEW COMPARISON:  Chest x-ray 07/09/2023.  Chest CT 07/07/2023.  FINDINGS: There is Samaad Hashem new moderate-sized left pneumothorax. Patient is rotated. No definite mediastinal shift. There are patchy opacities in the left lower lung and left mid lung likely related to nodular density seen on CT and atelectasis. No pleural effusion. Cardiomediastinal silhouette appears within normal limits. No acute fractures. IMPRESSION: 1. New moderate-sized left pneumothorax. No definite mediastinal shift. 2. Patchy opacities in the left lower lung and left mid lung likely related to nodular density seen on CT and atelectasis. Electronically Signed   By: Greig Pique M.D.   On: 07/09/2023 16:57   DG CHEST PORT 1 VIEW Result Date: 07/09/2023 CLINICAL DATA:  Status post bronchoscopy EXAM: PORTABLE CHEST 1 VIEW COMPARISON:  07/06/2023 FINDINGS: Cardiac shadow is stable. Aortic calcifications are seen. Lungs are well aerated bilaterally. Some slight increase in airspace opacity is noted surrounding the rounded area of density seen on the prior exam. This may represent some post bronchoscopy hemorrhage. Additionally Breeann Reposa small apical and lateral left-sided pneumothorax is noted. Less than 1 cm excursion is noted. IMPRESSION: Small left pneumothorax following bronchoscopy. Slight increased density is noted in the left mid lung which may be related to post biopsy hemorrhage. These results will be called to the ordering clinician or representative by the Radiologist Assistant, and communication documented in the PACS or [employer]. Electronically Signed   By: Oneil Devonshire M.D.   On: 07/09/2023 15:26   DG C-ARM BRONCHOSCOPY Result Date: 07/09/2023 C-ARM BRONCHOSCOPY: Fluoroscopy was utilized by the requesting physician.  No radiographic interpretation.   ECHOCARDIOGRAM COMPLETE Result Date: 07/08/2023    ECHOCARDIOGRAM REPORT   Patient Name:   Courtney Bennett Date of Exam: 07/08/2023 Medical Rec #:  [id number]       Height:  63.0 in Accession #:    [id number]      Weight:       86.7 lb Date of  Birth:  28-Mar-1966       BSA:          1.355 m Patient Age:    57 years        BP:           121/81 mmHg Patient Gender: F               HR:           80 bpm. Exam Location:  Inpatient Procedure: 2D Echo, Cardiac Doppler and Color Doppler Indications:    Abnormal ECG  History:        Patient has prior history of Echocardiogram examinations, most                 recent 12/29/2022. Signs/Symptoms:Shortness of Breath and                 Dyspnea; Risk Factors:Hypertension, Diabetes and Former Smoker.  Sonographer:    Tillman Nora RVT RCS Referring Phys: [phone] Millena Callins CALDWELL POWELL JR IMPRESSIONS  1. Left ventricular ejection fraction, by estimation, is 60 to 65%. The left ventricle has normal function. The left ventricle has no regional wall motion abnormalities. Left ventricular diastolic parameters are consistent with Grade I diastolic dysfunction (impaired relaxation).  2. Right ventricular systolic function is normal. The right ventricular size is normal. There is normal pulmonary artery systolic pressure.  3. There is no evidence of cardiac tamponade.  4. The mitral valve is normal in structure. Trivial mitral valve regurgitation. No evidence of mitral stenosis.  5. The aortic valve is normal in structure. Aortic valve regurgitation is not visualized. No aortic stenosis is present.  6. The inferior vena cava is normal in size with greater than 50% respiratory variability, suggesting right atrial pressure of 3 mmHg. FINDINGS  Left Ventricle: Left ventricular ejection fraction, by estimation, is 60 to 65%. The left ventricle has normal function. The left ventricle has no regional wall motion abnormalities. The left ventricular internal cavity size was normal in size. There is  no left ventricular hypertrophy. Left ventricular diastolic parameters are consistent with Grade I diastolic dysfunction (impaired relaxation). Right Ventricle: The right ventricular size is normal. No increase in right ventricular wall thickness.  Right ventricular systolic function is normal. There is normal pulmonary artery systolic pressure. The tricuspid regurgitant velocity is 1.98 m/s, and  with an assumed right atrial pressure of 3 mmHg, the estimated right ventricular systolic pressure is 18.7 mmHg. Left Atrium: Left atrial size was normal in size. Right Atrium: Right atrial size was normal in size. Pericardium: There is no evidence of pericardial effusion. The pericardial effusion is anterior to the right ventricle. There is no evidence of cardiac tamponade. Mitral Valve: The mitral valve is normal in structure. Trivial mitral valve regurgitation. No evidence of mitral valve stenosis. Tricuspid Valve: The tricuspid valve is normal in structure. Tricuspid valve regurgitation is trivial. No evidence of tricuspid stenosis. Aortic Valve: The aortic valve is normal in structure. Aortic valve regurgitation is not visualized. No aortic stenosis is present. Aortic valve mean gradient measures 3.0 mmHg. Aortic valve peak gradient measures 5.7 mmHg. Aortic valve area, by VTI measures 2.07 cm. Pulmonic Valve: The pulmonic valve was normal in structure. Pulmonic valve regurgitation is not visualized. No evidence of pulmonic stenosis. Aorta: The aortic root is normal in size and structure. Venous: The  inferior vena cava is normal in size with greater than 50% respiratory variability, suggesting right atrial pressure of 3 mmHg. IAS/Shunts: No atrial level shunt detected by color flow Doppler.  LEFT VENTRICLE PLAX 2D LVIDd:         3.70 cm   Diastology LVIDs:         2.40 cm   LV e' medial:    7.18 cm/s LV PW:         1.10 cm   LV E/e' medial:  10.4 LV IVS:        0.80 cm   LV e' lateral:   7.62 cm/s LVOT diam:     1.80 cm   LV E/e' lateral: 9.8 LV SV:         49 LV SV Index:   36 LVOT Area:     2.54 cm  RIGHT VENTRICLE          IVC RV Basal diam:  2.70 cm  IVC diam: 1.90 cm RV Mid diam:    2.20 cm LEFT ATRIUM             Index        RIGHT ATRIUM          Index  LA diam:        3.20 cm 2.36 cm/m   RA Area:     9.63 cm LA Vol (A2C):   39.7 ml 29.29 ml/m  RA Volume:   22.60 ml 16.67 ml/m LA Vol (A4C):   34.0 ml 25.08 ml/m LA Biplane Vol: 41.9 ml 30.91 ml/m  AORTIC VALVE                    PULMONIC VALVE AV Area (Vmax):    2.03 cm     PV Vmax:       0.70 m/s AV Area (Vmean):   2.01 cm     PV Peak grad:  1.9 mmHg AV Area (VTI):     2.07 cm AV Vmax:           119.50 cm/s AV Vmean:          82.400 cm/s AV VTI:            0.238 m AV Peak Grad:      5.7 mmHg AV Mean Grad:      3.0 mmHg LVOT Vmax:         95.50 cm/s LVOT Vmean:        65.100 cm/s LVOT VTI:          0.193 m LVOT/AV VTI ratio: 0.81  AORTA Ao Root diam: 3.20 cm MITRAL VALVE               TRICUSPID VALVE MV Area (PHT): 3.68 cm    TR Peak grad:   15.7 mmHg MV Decel Time: 206 msec    TR Vmax:        198.00 cm/s MV E velocity: 74.60 cm/s MV Hussien Greenblatt velocity: 75.40 cm/s  SHUNTS MV E/Joanna Borawski ratio:  0.99        Systemic VTI:  0.19 m                            Systemic Diam: 1.80 cm Kardie Tobb DO Electronically signed by Kardie Tobb DO Signature Date/Time: 07/08/2023/12:15:13 PM    Final         Scheduled Meds:  acetaminophen   1,000 mg Oral Q8H  Chlorhexidine  Gluconate Cloth  6 each Topical Daily   enoxaparin  (LOVENOX ) injection  30 mg Subcutaneous Q24H   fluticasone   2 spray Each Nare Daily   insulin  aspart  0-5 Units Subcutaneous QHS   insulin  aspart  0-9 Units Subcutaneous TID WC   insulin  aspart  8 Units Subcutaneous TID WC   insulin  glargine-yfgn  12 Units Subcutaneous Daily   sodium chloride  flush  10 mL Intrapleural Q8H   sodium chloride  flush  3 mL Intravenous Q12H   Continuous Infusions:  sodium chloride  10 mL/hr at 07/09/23 1700   cefTRIAXone  (ROCEPHIN )  IV Stopped (07/09/23 1652)     LOS: 3 days    Time spent: over 30 min    Meliton Monte, MD [facility]   To contact the attending provider between 7A-7P or the covering provider during after hours 7P-7A, please log into the  web site [email or web address] and access using universal [place] password for that web site. If you do not have the password, please call the hospital operator.  07/09/2023, 6:05 PM

## 2023-07-09 NOTE — Consult Note (Signed)
   NAME:  Courtney Bennett, MRN:  [id number], DOB:  Dec 08, 1965, LOS: 3 ADMISSION DATE:  07/06/2023, CONSULTATION DATE:  07/09/2023 REFERRING MD:  Perri DELENA Meliton Mickey., *, CHIEF COMPLAINT:  pneumonia   History of Present Illness:  Courtney Bennett is a 58 year old unhoused woman with past medical history of COPD ongoing tobacco use disorder who presents with 2 weeks of worsening cough, shortness of breath, weakness, fatigue.  She is not bringing up significant sputum.  She notes that she has unintentionally lost weight who is 15 pounds over the last few months.  She has night sweats.  She denies hemoptysis.  Pertinent  Medical History  COPD Bipolar disorder, with paranoia and psychotic features Type 1 diabetes mellitus   Significant Hospital Events: Including procedures, antibiotic start and stop dates in addition to other pertinent events   2/7 Post bronchoscopy with transbronchial biopsies c/b small left pneumothorax. Transferred to ICU for respiratory distress  Interim History / Subjective:    Objective   Blood pressure (!) 176/63, pulse (!) 125, temperature 97.9 F (36.6 C), temperature source Temporal, resp. rate 20, height 5' 3 (1.6 m), weight 39.3 kg, last menstrual period 11/24/2014, SpO2 (!) 89%.        Intake/Output Summary (Last 24 hours) at 07/09/2023 1543 Last data filed at 07/09/2023 1421 Gross per 24 hour  Intake 560 ml  Output 10 ml  Net 550 ml   Filed Weights   07/06/23 1519 07/06/23 1900  Weight: 44 kg 39.3 kg   Physical Exam: General: Frail, cachectic-appearing, moderate respiratory acute distress HENT: [place], AT, OP clear, MMM Eyes: EOMI, no scleral icterus Respiratory: Diminished left upper lobe air entry. Remaining lung fields clear to auscultation bilaterally.  No crackles, wheezing or rales Cardiovascular: RRR, -M/R/G, no JVD Extremities:-Edema,-tenderness Neuro: AAO x4, CNII-XII grossly intact  CT chest 07/07/23 which shows multiple patchy airspace opacities  with tree-in-bud as well as occasional cavitary thin-walled lesions concerning for multifocal infection CXR with worsening left sided pneumothorax  Resolved Hospital Problem list     Assessment & Plan:   Severe sepsis secondary pneumonia Abnormal CT chest -concerning for atypical infection including NTM COPD with ongoing tobacco use disorder Constitutional symptoms including night sweats and weight loss She additionally has risk factors for tuberculosis and we have had an increase in cases in   this year --empiric community-acquired pneumonia Antibiotic therapy at this time. --F/u BAL, AFB --strep pneumo antigen negative. Legionella in process --AFB sputum rule outs and airborne isolation at this time  Iatrogenic left pneumothorax  --Place chest tube now --Chest tube -20 cm H20 --Routine chest tube care   Critical care time: 40 min   The patient is critically ill with respiratory failure post-procedre and requires high complexity decision making for assessment and support, frequent evaluation and titration of therapies, application of advanced monitoring technologies and extensive interpretation of multiple databases.  Independent Critical Care Time: 40 Minutes.   Slater Staff, M.D. [facility] Pulmonary/Critical Care Medicine 07/09/2023 3:43 PM   Please see Amion for pager number to reach on-call Pulmonary and Critical Care Team.

## 2023-07-09 NOTE — Op Note (Signed)
 [facility] Cardiopulmonary Patient Name: Courtney Bennett Procedure Date: 07/09/2023 MRN: [id number] Attending MD: Slater Staff , MD, [id number] Date of Birth: 1966/02/22 CSN: [id number] Age: 58 Admit Type: Inpatient Ethnicity: Not Hispanic or Latino Procedure:             Bronchoscopy Indications:           Atelectasis of the left upper lobe Providers:             Slater Staff, MD Referring MD:           Medicines:             General Anesthesia Complications:         Respiratory failure secondary to iatrogenic left                         pneumothorax requiring chest tube placement Estimated Blood Loss:  Estimated blood loss was minimal. Procedure:      Pre-Anesthesia Assessment:      - A History and Physical has been performed. Patient meds and allergies       have been reviewed. The risks and benefits of the procedure and the       sedation options and risks were discussed with the patient. All       questions were answered and informed consent was obtained. Patient       identification and proposed procedure were verified prior to the       procedure by the physician in the procedure room. Mental Status       Examination: normal. Airway Examination: normal oropharyngeal airway.       Lungs: Diminished but clear to auscultation bilaterally. CV Examination:       normal. ASA Grade Assessment: II - A patient with mild systemic disease.       After reviewing the risks and benefits, the patient was deemed in       satisfactory condition to undergo the procedure. The anesthesia plan was       to use general anesthesia. Immediately prior to administration of       medications, the patient was re-assessed for adequacy to receive       sedatives. The heart rate, respiratory rate, oxygen saturations, blood       pressure, adequacy of pulmonary ventilation, and response to care were       monitored throughout the procedure. The physical status of the patient       was  re-assessed after the procedure.      After obtaining informed consent, the bronchoscope was passed under       direct vision. Throughout the procedure, the patient's blood pressure,       pulse, and oxygen saturations were monitored continuously. the BF-H190       ([id number]) Olympus bronchoscope was introduced through the mouth, via the       endotracheal tube and advanced to the tracheobronchial tree. Normal       anatomy. Thick purulent secretions bilaterally that were suctioned and       cleared. BAL peformed on the superior segment of lingula with 120 cc       saline instilled and 20 cc collected for cell count, culture, AFB and       fungal. Brushings obtained for superior segement x 2 for AFB.       Transbronchial forcep biopsy x 3 obtained for AFB and surgical path. The  procedure was accomplished without difficulty. The patient tolerated the       procedure. Findings:      As above Impression:      - Infiltate of LUL/lingula      -S/p BAL, brushings and biopsy of lingula Moderate Sedation:      General anesthesia Recommendation:      - Await test results. Procedure Code(s):      --- Professional ---      [id number], Bronchoscopy, rigid or flexible, including fluoroscopic guidance,       when performed; diagnostic, with cell washing, when performed (separate       procedure) Diagnosis Code(s):      --- Professional ---      J98.11, Atelectasis CPT copyright 2022 [employer]. All rights reserved. The codes documented in this report are preliminary and upon coder review may  be revised to meet current compliance requirements. Slater Staff, MD 07/09/2023 8:42:29 PM This report has been signed electronically. Number of Addenda: 0 Scope In: Scope Out:

## 2023-07-10 ENCOUNTER — Inpatient Hospital Stay (HOSPITAL_COMMUNITY): Payer: 59

## 2023-07-10 DIAGNOSIS — J939 Pneumothorax, unspecified: Secondary | ICD-10-CM | POA: Diagnosis not present

## 2023-07-10 DIAGNOSIS — A419 Sepsis, unspecified organism: Secondary | ICD-10-CM | POA: Diagnosis not present

## 2023-07-10 DIAGNOSIS — J189 Pneumonia, unspecified organism: Secondary | ICD-10-CM | POA: Diagnosis not present

## 2023-07-10 LAB — COMPREHENSIVE METABOLIC PANEL
ALT: 10 U/L (ref 0–44)
AST: 8 U/L — ABNORMAL LOW (ref 15–41)
Albumin: 2 g/dL — ABNORMAL LOW (ref 3.5–5.0)
Alkaline Phosphatase: 51 U/L (ref 38–126)
Anion gap: 7 (ref 5–15)
BUN: 13 mg/dL (ref 6–20)
CO2: 30 mmol/L (ref 22–32)
Calcium: 8 mg/dL — ABNORMAL LOW (ref 8.9–10.3)
Chloride: 97 mmol/L — ABNORMAL LOW (ref 98–111)
Creatinine, Ser: <0.3 mg/dL — ABNORMAL LOW (ref 0.44–1.00)
Glucose, Bld: 225 mg/dL — ABNORMAL HIGH (ref 70–99)
Potassium: 4.1 mmol/L (ref 3.5–5.1)
Sodium: 134 mmol/L — ABNORMAL LOW (ref 135–145)
Total Bilirubin: 0.4 mg/dL (ref 0.0–1.2)
Total Protein: 5.1 g/dL — ABNORMAL LOW (ref 6.5–8.1)

## 2023-07-10 LAB — CBC WITH DIFFERENTIAL/PLATELET
Abs Immature Granulocytes: 0.07 10*3/uL (ref 0.00–0.07)
Basophils Absolute: 0 10*3/uL (ref 0.0–0.1)
Basophils Relative: 0 %
Eosinophils Absolute: 0 10*3/uL (ref 0.0–0.5)
Eosinophils Relative: 0 %
HCT: 42.5 % (ref 36.0–46.0)
Hemoglobin: 13.3 g/dL (ref 12.0–15.0)
Immature Granulocytes: 1 %
Lymphocytes Relative: 9 %
Lymphs Abs: 0.9 10*3/uL (ref 0.7–4.0)
MCH: 30.6 pg (ref 26.0–34.0)
MCHC: 31.3 g/dL (ref 30.0–36.0)
MCV: 97.9 fL (ref 80.0–100.0)
Monocytes Absolute: 1 10*3/uL (ref 0.1–1.0)
Monocytes Relative: 10 %
Neutro Abs: 8.7 10*3/uL — ABNORMAL HIGH (ref 1.7–7.7)
Neutrophils Relative %: 80 %
Platelets: 347 10*3/uL (ref 150–400)
RBC: 4.34 MIL/uL (ref 3.87–5.11)
RDW: 12.7 % (ref 11.5–15.5)
WBC: 10.8 10*3/uL — ABNORMAL HIGH (ref 4.0–10.5)
nRBC: 0 % (ref 0.0–0.2)

## 2023-07-10 LAB — ACID FAST SMEAR (AFB, MYCOBACTERIA)
Acid Fast Smear: NEGATIVE
Acid Fast Smear: NEGATIVE

## 2023-07-10 LAB — GLUCOSE, CAPILLARY
Glucose-Capillary: 200 mg/dL — ABNORMAL HIGH (ref 70–99)
Glucose-Capillary: 301 mg/dL — ABNORMAL HIGH (ref 70–99)
Glucose-Capillary: 206 mg/dL — ABNORMAL HIGH (ref 70–99)
Glucose-Capillary: 234 mg/dL — ABNORMAL HIGH (ref 70–99)

## 2023-07-10 LAB — MAGNESIUM: Magnesium: 1.7 mg/dL (ref 1.7–2.4)

## 2023-07-10 LAB — PHOSPHORUS: Phosphorus: 3.4 mg/dL (ref 2.5–4.6)

## 2023-07-10 MED ORDER — INSULIN GLARGINE-YFGN 100 UNIT/ML ~~LOC~~ SOLN
14.0000 [IU] | Freq: Every day | SUBCUTANEOUS | Status: DC
Start: 2023-07-11 — End: 2023-07-11
  Filled 2023-07-10: qty 0.14

## 2023-07-10 MED ORDER — KETOROLAC TROMETHAMINE 15 MG/ML IJ SOLN
15.0000 mg | Freq: Three times a day (TID) | INTRAMUSCULAR | Status: DC | PRN
  Administered 2023-07-10 – 2023-07-12 (×5): 15 mg via INTRAVENOUS
  Filled 2023-07-10 (×5): qty 1

## 2023-07-10 MED ORDER — ENOXAPARIN SODIUM 40 MG/0.4ML IJ SOSY
40.0000 mg | PREFILLED_SYRINGE | INTRAMUSCULAR | Status: DC
  Administered 2023-07-11 – 2023-07-15 (×5): 40 mg via SUBCUTANEOUS
  Filled 2023-07-10 (×8): qty 0.4

## 2023-07-10 MED ORDER — LIDOCAINE 5 % EX PTCH
1.0000 | MEDICATED_PATCH | Freq: Every day | CUTANEOUS | Status: DC
  Administered 2023-07-10 – 2023-07-12 (×3): 1 via TRANSDERMAL
  Filled 2023-07-10 (×9): qty 1

## 2023-07-10 MED ORDER — METHOCARBAMOL 500 MG PO TABS
500.0000 mg | ORAL_TABLET | Freq: Three times a day (TID) | ORAL | Status: DC | PRN
  Administered 2023-07-10 – 2023-07-12 (×6): 500 mg via ORAL
  Filled 2023-07-10 (×6): qty 1

## 2023-07-10 NOTE — Progress Notes (Signed)
   NAME:  Courtney Bennett, MRN:  [id number], DOB:  [birth year], LOS: 4 ADMISSION DATE:  07/06/2023, CONSULTATION DATE:  07/10/2023 REFERRING MD:  Perri DELENA Meliton Mickey., *, CHIEF COMPLAINT:  pneumonia   History of Present Illness:  Courtney Bennett is a 58 year old unhoused woman with past medical history of COPD ongoing tobacco use disorder who presents with 2 weeks of worsening cough, shortness of breath, weakness, fatigue.  She is not bringing up significant sputum.  She notes that she has unintentionally lost weight who is 15 pounds over the last few months.  She has night sweats.  She denies hemoptysis.  Pertinent  Medical History  COPD Bipolar disorder, with paranoia and psychotic features Type 1 diabetes mellitus   Significant Hospital Events: Including procedures, antibiotic start and stop dates in addition to other pertinent events   2/7 Post bronchoscopy with transbronchial biopsies c/b small left pneumothorax. Transferred to ICU for respiratory distress  Interim History / Subjective:  Chest tube placed.  After repeat chest x-ray showed worsening pneumothorax.  Repeat chest x-ray in the afternoon and suddenly this morning shows resolution of pneumothorax on my review interpretation.  However she still has fairly significant air leak with inspiration on suction.  Objective   Blood pressure (!) 116/44, pulse 94, temperature 98.3 F (36.8 C), temperature source Oral, resp. rate 16, height 5' 3 (1.6 m), weight 64.5 kg, last menstrual period 11/24/2014, SpO2 100%.        Intake/Output Summary (Last 24 hours) at 07/10/2023 1031 Last data filed at 07/10/2023 0545 Gross per 24 hour  Intake 784.41 ml  Output 30 ml  Net 754.41 ml   Filed Weights   07/06/23 1519 07/06/23 1900 07/09/23 1541  Weight: 44 kg 39.3 kg 64.5 kg   Physical Exam: General: Frail, cachectic-appearing, in no distress HENT: [place], AT, OP clear, MMM Eyes: EOMI, no scleral icterus Respiratory: Normal work of breathing   Cardiovascular: RRR,  no JVD Extremities:-Edema,-tenderness Neuro: AAO x4, CNII-XII grossly intact   Resolved Hospital Problem list     Assessment & Plan:   Severe sepsis secondary pneumonia Abnormal CT chest -concerning for atypical infection  COPD with ongoing tobacco use disorder Constitutional symptoms including night sweats and weight loss --empiric community-acquired pneumonia Antibiotic therapy at this time (completed course of azithromycin , continue ceftriaxone  --AFB smear sputum 2/7 expectorated sputum pending, 2/7 BAL pending, 2/7 bronchial brush x 2 pending, 2/7 pathology pending --BAL cell counts consistent with likely bacterial pathogen, PMN predominant --BAL Gram stain no organisms, pending pending --strep pneumo antigen negative. Legionella in process --AFB sputum rule outs and airborne isolation at this time, suspicion for TB is low  Iatrogenic left pneumothorax  -- Continue chest tube, Daily chest x-ray while in place --Chest tube -20 cm H20 given ongoing air leak, fortunately radiographically has resolved, will place to waterseal once airleak resolves --Routine chest tube care  Left lateral chest pain: Pleuritic in nature.  Same site as infiltrates.  A bit worse post bronc with pneumothorax. -- Scheduled Tylenol , as needed NSAIDs and IV (intolerance of oxycodone , nausea vomiting) -- Lidocaine  patch added 2/8  PCCM will continue to follow for management of pneumothorax, chest tube  Critical care time: n/a     Courtney JONELLE Beals, MD [facility] Pulmonary/Critical Care Medicine 07/10/2023 10:31 AM   Please see Amion for pager number to reach on-call Pulmonary and Critical Care Team.

## 2023-07-10 NOTE — Plan of Care (Signed)
  Problem: Education: Goal: Ability to describe self-care measures that may prevent or decrease complications (Diabetes Survival Skills Education) will improve Outcome: Progressing   Problem: Coping: Goal: Ability to adjust to condition or change in health will improve Outcome: Progressing   Problem: Fluid Volume: Goal: Ability to maintain a balanced intake and output will improve Outcome: Progressing   Problem: Health Behavior/Discharge Planning: Goal: Ability to identify and utilize available resources and services will improve Outcome: Progressing Goal: Ability to manage health-related needs will improve Outcome: Progressing   Problem: Metabolic: Goal: Ability to maintain appropriate glucose levels will improve Outcome: Progressing   Problem: Nutritional: Goal: Maintenance of adequate nutrition will improve Outcome: Progressing Goal: Progress toward achieving an optimal weight will improve Outcome: Progressing   Problem: Skin Integrity: Goal: Risk for impaired skin integrity will decrease Outcome: Progressing   Problem: Tissue Perfusion: Goal: Adequacy of tissue perfusion will improve Outcome: Progressing   Problem: Education: Goal: Knowledge of General Education information will improve Description: Including pain rating scale, medication(s)/side effects and non-pharmacologic comfort measures Outcome: Progressing   Problem: Health Behavior/Discharge Planning: Goal: Ability to manage health-related needs will improve Outcome: Progressing   Problem: Clinical Measurements: Goal: Ability to maintain clinical measurements within normal limits will improve Outcome: Progressing Goal: Will remain free from infection Outcome: Progressing Goal: Diagnostic test results will improve Outcome: Progressing Goal: Respiratory complications will improve Outcome: Progressing Goal: Cardiovascular complication will be avoided Outcome: Progressing   Problem: Activity: Goal:  Risk for activity intolerance will decrease Outcome: Progressing   Problem: Nutrition: Goal: Adequate nutrition will be maintained Outcome: Progressing   Problem: Coping: Goal: Level of anxiety will decrease Outcome: Progressing   Problem: Elimination: Goal: Will not experience complications related to bowel motility Outcome: Progressing Goal: Will not experience complications related to urinary retention Outcome: Progressing   Problem: Pain Managment: Goal: General experience of comfort will improve and/or be controlled Outcome: Progressing   Problem: Safety: Goal: Ability to remain free from injury will improve Outcome: Progressing   Problem: Skin Integrity: Goal: Risk for impaired skin integrity will decrease Outcome: Progressing   Problem: Fluid Volume: Goal: Hemodynamic stability will improve Outcome: Progressing   Problem: Clinical Measurements: Goal: Diagnostic test results will improve Outcome: Progressing Goal: Signs and symptoms of infection will decrease Outcome: Progressing   Problem: Respiratory: Goal: Ability to maintain adequate ventilation will improve Outcome: Progressing   Problem: Activity: Goal: Ability to tolerate increased activity will improve Outcome: Progressing   Problem: Clinical Measurements: Goal: Ability to maintain a body temperature in the normal range will improve Outcome: Progressing   Problem: Respiratory: Goal: Ability to maintain adequate ventilation will improve Outcome: Progressing Goal: Ability to maintain a clear airway will improve Outcome: Progressing   Cindy S. Loreli BSN, RN, CCRP, CCRN 07/10/2023 6:01 AM

## 2023-07-10 NOTE — Progress Notes (Signed)
 PROGRESS NOTE    Courtney Bennett  FMW:[id number] DOB: [birth year] DOA: 07/06/2023 PCP: Patient, No Pcp Per  Chief Complaint  Patient presents with   Weakness    Brief Narrative:   58 yo with T1DM presenting with cough and shortness of breath.  She's been admitted with CAP.    Assessment & Plan:   Principal Problem:   Community acquired pneumonia of left upper lobe of lung Active Problems:   DM (diabetes mellitus) (HCC)  Sepsis due to Community Acquired Pneumonia Meets criteria for sepsis with tachycardia, leukocytosis and CXR concerning for pneumonia Continue CAP coverage with ceftriaxone , azithromycin   CT chest with multifocal pneumonia, atypical in etiology Appreciate pulmonology assistance s/p bronch 2/7, follow results Cytology pending Cultures pending Pending pathology AFB smears/cultures pending 2/6 AFB smear negative Airborne precautions, AFB smears/culture Follow blood cultures Negative covid, flu, RSV MRSA PCR negative, urine strep negative, urine legionella negative Continue airborne precautions  Post Bronch Pneumothorax S/p chest tube per pulm  CXR pending  Pleuritic Chest Pain Suspect due to above process Consider additional workup if change in description of pain   Anasarca Suspect at least partially related to hypoalbuminemia Echo with EF 60-65%, no RWMA, grade 1 diastolic dysfunction Will follow UA -> negative protein on UA  T1DM Currently on basal bolus (sounds like she's not taking appropriately at home) A1c 13.1 06/2023 Will follow and adjust as appropriate  Cachexia Could be from chronically poorly controlled diabetes (she suggests this as the reason) Will follow CT above Needs additional evaluation/workup     DVT prophylaxis: lovenox  Code Status: full Family Communication: none Disposition:   Status is: Inpatient Remains inpatient appropriate because: need for continued inpatient care   Consultants:  none  Procedures:   none  Antimicrobials:  Anti-infectives (From admission, onward)    Start     Dose/Rate Route Frequency Ordered Stop   07/07/23 1600  azithromycin  (ZITHROMAX ) 500 mg in sodium chloride  0.9 % 250 mL IVPB        500 mg 250 mL/hr over 60 Minutes Intravenous Every 24 hours 07/06/23 1748 07/08/23 1913   07/07/23 1600  cefTRIAXone  (ROCEPHIN ) 2 g in sodium chloride  0.9 % 100 mL IVPB        2 g 200 mL/hr over 30 Minutes Intravenous Every 24 hours 07/06/23 1748 07/12/23 1559   07/06/23 1600  cefTRIAXone  (ROCEPHIN ) 2 g in sodium chloride  0.9 % 100 mL IVPB        2 g 200 mL/hr over 30 Minutes Intravenous Once 07/06/23 1555 07/06/23 1655   07/06/23 1600  azithromycin  (ZITHROMAX ) 500 mg in sodium chloride  0.9 % 250 mL IVPB        500 mg 250 mL/hr over 60 Minutes Intravenous  Once 07/06/23 1555 07/06/23 1724       Subjective: Still has pain, toradol , muscle relaxer, APAP helping  Objective: Vitals:   07/10/23 0600 07/10/23 0700 07/10/23 0800 07/10/23 0826  BP: (!) 164/85 (!) 179/83 (!) 188/87 (!) 151/64  Pulse: 72 72 71 78  Resp: 13 13 (!) 9 12  Temp:    98.3 F (36.8 C)  TempSrc:    Oral  SpO2: 100% 98% 100% 93%  Weight:      Height:        Intake/Output Summary (Last 24 hours) at 07/10/2023 0837 Last data filed at 07/10/2023 0545 Gross per 24 hour  Intake 784.41 ml  Output 30 ml  Net 754.41 ml   Filed Weights   07/06/23 1519 07/06/23  1900 07/09/23 1541  Weight: 44 kg 39.3 kg 64.5 kg    Examination:  General: No acute distress. Cardiovascular: RRR Lungs: scattered rhonchi Neurological: Alert and oriented 3. Moves all extremities 4 with equal strength. Cranial nerves II through XII grossly intact.  Extremities: No clubbing or cyanosis. No edema.  Data Reviewed: I have personally reviewed following labs and imaging studies  CBC: Recent Labs  Lab 07/06/23 1606 07/06/23 1916 07/07/23 0409 07/08/23 0417 07/10/23 0248  WBC 16.0* 15.5* 15.5* 11.4* 10.8*  NEUTROABS  13.1*  --   --  8.8* 8.7*  HGB 17.2* 15.9* 14.7 14.1 13.3  HCT 53.8* 50.3* 47.2* 45.0 42.5  MCV 97.1 98.8 98.7 98.5 97.9  PLT 269 234 241 268 347    Basic Metabolic Panel: Recent Labs  Lab 07/06/23 1606 07/06/23 1916 07/06/23 2251 07/07/23 0409 07/08/23 0417 07/10/23 0248  NA 138  --  132* 136 130* 134*  K 4.2  --  4.2 3.7 3.5 4.1  CL 98  --  98 101 96* 97*  CO2 20*  --  20* 25 28 30   GLUCOSE 295*  --  370* 293* 259* 225*  BUN 13  --  12 9 14 13   CREATININE 0.33* 0.54 0.34* <0.30* 0.33* <0.30*  CALCIUM  9.0  --  8.1* 8.4* 8.1* 8.0*  MG  --   --   --   --  1.8 1.7  PHOS  --   --   --   --  1.9* 3.4    GFR: CrCl cannot be calculated (This lab value cannot be used to calculate CrCl because it is not Silus Lanzo number: <0.30).  Liver Function Tests: Recent Labs  Lab 07/06/23 1606 07/08/23 0417 07/10/23 0248  AST 12* 10* 8*  ALT 16 11 10   ALKPHOS 76 63 51  BILITOT 1.5* 0.5 0.4  PROT 7.4 5.2* 5.1*  ALBUMIN 3.3* 2.1* 2.0*    CBG: Recent Labs  Lab 07/09/23 0736 07/09/23 1159 07/09/23 1649 07/09/23 2157 07/10/23 0815  GLUCAP 195* 126* 143* 266* 200*     Recent Results (from the past 240 hours)  Blood Culture (routine x 2)     Status: None (Preliminary result)   Collection Time: 07/06/23  4:06 PM   Specimen: BLOOD RIGHT ARM  Result Value Ref Range Status   Specimen Description   Final    BLOOD RIGHT ARM Performed at [facility], [address] [place], [place] [address]    Special Requests   Final    BOTTLES DRAWN AEROBIC AND ANAEROBIC Blood Culture results may not be optimal due to an inadequate volume of blood received in culture bottles Performed at [facility], [address] [place], [place] [address]    Culture   Final    NO GROWTH 3 DAYS Performed at [facility], [address] [place], [place] [address]    Report Status PENDING  Incomplete  Blood Culture (routine x 2)     Status: None (Preliminary result)    Collection Time: 07/06/23  4:09 PM   Specimen: BLOOD LEFT ARM  Result Value Ref Range Status   Specimen Description   Final    BLOOD LEFT ARM Performed at [facility], [address] [place], [place] [address]    Special Requests   Final    BOTTLES DRAWN AEROBIC AND ANAEROBIC Blood Culture results may not be optimal due to an inadequate volume of blood received in culture bottles  Performed at [facility], [address] [place], [place] [address]    Culture   Final    NO GROWTH 3 DAYS Performed at [facility], [address] [place], [place] [address]    Report Status PENDING  Incomplete  Resp panel by RT-PCR (RSV, Flu Kona Yusuf&B, Covid) Anterior Nasal Swab     Status: None   Collection Time: 07/06/23 10:52 PM   Specimen: Anterior Nasal Swab  Result Value Ref Range Status   SARS Coronavirus 2 by RT PCR NEGATIVE NEGATIVE Final    Comment: (NOTE) SARS-CoV-2 target nucleic acids are NOT DETECTED.  The SARS-CoV-2 RNA is generally detectable in upper respiratory specimens during the acute phase of infection. The lowest concentration of SARS-CoV-2 viral copies this assay can detect is 138 copies/mL. Justus Duerr negative result does not preclude SARS-Cov-2 infection and should not be used as the sole basis for treatment or other patient management decisions. Triana Coover negative result may occur with  improper specimen collection/handling, submission of specimen other than nasopharyngeal swab, presence of viral mutation(s) within the areas targeted by this assay, and inadequate number of viral copies(<138 copies/mL). Madyn Ivins negative result must be combined with clinical observations, patient history, and epidemiological information. The expected result is Negative.  Fact Sheet for Patients:  bloggercourse.com  Fact Sheet for Healthcare Providers:  seriousbroker.it  This test is no t yet approved or cleared by the [place]   FDA and  has been authorized for detection and/or diagnosis of SARS-CoV-2 by FDA under an Emergency Use Authorization (EUA). This EUA will remain  in effect (meaning this test can be used) for the duration of the COVID-19 declaration under Section 564(b)(1) of the Act, 21 U.S.C.section 360bbb-3(b)(1), unless the authorization is terminated  or revoked sooner.       Influenza Teighlor Korson by PCR NEGATIVE NEGATIVE Final   Influenza B by PCR NEGATIVE NEGATIVE Final    Comment: (NOTE) The Xpert Xpress SARS-CoV-2/FLU/RSV plus assay is intended as an aid in the diagnosis of influenza from Nasopharyngeal swab specimens and should not be used as Trenity Pha sole basis for treatment. Nasal washings and aspirates are unacceptable for Xpert Xpress SARS-CoV-2/FLU/RSV testing.  Fact Sheet for Patients: bloggercourse.com  Fact Sheet for Healthcare Providers: seriousbroker.it  This test is not yet approved or cleared by the [place]  FDA and has been authorized for detection and/or diagnosis of SARS-CoV-2 by FDA under an Emergency Use Authorization (EUA). This EUA will remain in effect (meaning this test can be used) for the duration of the COVID-19 declaration under Section 564(b)(1) of the Act, 21 U.S.C. section 360bbb-3(b)(1), unless the authorization is terminated or revoked.     Resp Syncytial Virus by PCR NEGATIVE NEGATIVE Final    Comment: (NOTE) Fact Sheet for Patients: bloggercourse.com  Fact Sheet for Healthcare Providers: seriousbroker.it  This test is not yet approved or cleared by the [place]  FDA and has been authorized for detection and/or diagnosis of SARS-CoV-2 by FDA under an Emergency Use Authorization (EUA). This EUA will remain in effect (meaning this test can be used) for the duration of the COVID-19 declaration under Section 564(b)(1) of the Act, 21 U.S.C. section  360bbb-3(b)(1), unless the authorization is terminated or revoked.  Performed at [facility], [address] [place], [place] [address]   MRSA Next Gen by PCR, Nasal     Status: None   Collection Time: 07/07/23 11:25 AM  Result Value Ref Range Status   MRSA by PCR Next  Gen NOT DETECTED NOT DETECTED Final    Comment: (NOTE) The GeneXpert MRSA Assay (FDA approved for NASAL specimens only), is one component of Santrice Muzio comprehensive MRSA colonization surveillance program. It is not intended to diagnose MRSA infection nor to guide or monitor treatment for MRSA infections. Test performance is not FDA approved in patients less than 63 years old. Performed at [facility], [address] [place], [place] [address]   Respiratory (~20 pathogens) panel by PCR     Status: None   Collection Time: 07/08/23  7:37 AM   Specimen: Nasopharyngeal Swab; Respiratory  Result Value Ref Range Status   Adenovirus NOT DETECTED NOT DETECTED Final   Coronavirus 229E NOT DETECTED NOT DETECTED Final    Comment: (NOTE) The Coronavirus on the Respiratory Panel, DOES NOT test for the novel  Coronavirus (2019 nCoV)    Coronavirus HKU1 NOT DETECTED NOT DETECTED Final   Coronavirus NL63 NOT DETECTED NOT DETECTED Final   Coronavirus OC43 NOT DETECTED NOT DETECTED Final   Metapneumovirus NOT DETECTED NOT DETECTED Final   Rhinovirus / Enterovirus NOT DETECTED NOT DETECTED Final   Influenza Angela Platner NOT DETECTED NOT DETECTED Final   Influenza B NOT DETECTED NOT DETECTED Final   Parainfluenza Virus 1 NOT DETECTED NOT DETECTED Final   Parainfluenza Virus 2 NOT DETECTED NOT DETECTED Final   Parainfluenza Virus 3 NOT DETECTED NOT DETECTED Final   Parainfluenza Virus 4 NOT DETECTED NOT DETECTED Final   Respiratory Syncytial Virus NOT DETECTED NOT DETECTED Final   Bordetella pertussis NOT DETECTED NOT DETECTED Final   Bordetella Parapertussis NOT DETECTED NOT DETECTED Final   Chlamydophila  pneumoniae NOT DETECTED NOT DETECTED Final   Mycoplasma pneumoniae NOT DETECTED NOT DETECTED Final    Comment: Performed at [facility], [address] [place], [place] [address]  Acid Fast Smear (AFB)     Status: None   Collection Time: 07/08/23  9:00 AM   Specimen: Sputum  Result Value Ref Range Status   AFB Specimen Processing Concentration  Final   Acid Fast Smear Negative  Final    Comment: (NOTE) Performed At[facility] [address] [place], [place] [id number] Jennette Shorter MD Ey:[id number]    Source (AFB) SPUTUM  Final    Comment: Performed at [facility], [address], [place], [place] [address]  Culture, BAL-quantitative w Gram Stain     Status: None (Preliminary result)   Collection Time: 07/09/23  1:55 PM   Specimen: Bronchial Alveolar Lavage; Respiratory  Result Value Ref Range Status   Specimen Description   Final    BRONCHIAL ALVEOLAR LAVAGE SPECIMEN Kare Dado Performed at [facility], [address] [place], [place] [address]    Special Requests   Final    NONE Performed at [facility], [address] [place], [place] [address]    Gram Stain   Final    RARE WBC PRESENT, PREDOMINANTLY PMN NO ORGANISMS SEEN Performed at [facility], [address] [place], [place] [address]    Culture PENDING  Incomplete   Report Status PENDING  Incomplete  Aerobic/Anaerobic Culture w Gram Stain (surgical/deep wound)     Status: None (Preliminary result)   Collection Time: 07/09/23  2:06 PM   Specimen: PATH Respiratory biopsy; Tissue  Result Value Ref Range Status   Specimen Description BIOPSY  Final   Special Requests LINGULA TRANSBRONCHIAL BIOSPIES SAMPLE D  Final   Gram Stain   Final  NO WBC SEEN NO ORGANISMS SEEN Performed at [facility], [address] [place], [place] [address]    Culture PENDING  Incomplete   Report Status PENDING  Incomplete         Radiology  Studies: DG CHEST PORT 1 VIEW Result Date: 07/09/2023 CLINICAL DATA:  Chest tube EXAM: PORTABLE CHEST 1 VIEW COMPARISON:  07/09/2023 FINDINGS: Interval placement of left chest tube. Re-expansion of the left lung. No visible pneumothorax. Subcutaneous emphysema within the left chest wall. Heart and mediastinal contours within normal limits. Bilateral airspace opacities similar to prior study. IMPRESSION: Interval placement of left chest tube with no visible pneumothorax. Patchy bilateral airspace disease. Electronically Signed   By: Franky Crease M.D.   On: 07/09/2023 21:07   DG CHEST PORT 1 VIEW Addendum Date: 07/09/2023 ADDENDUM REPORT: 07/09/2023 19:19 ADDENDUM: These results were called by telephone at the time of interpretation on 07/09/2023 at 4:01 pm to provider CHI ELLISON , who verbally acknowledged these results. Electronically Signed   By: Greig Pique M.D.   On: 07/09/2023 19:19   Result Date: 07/09/2023 CLINICAL DATA:  Status post bronchoscopy EXAM: PORTABLE CHEST 1 VIEW COMPARISON:  Chest x-ray 07/09/2023.  Chest CT 07/07/2023. FINDINGS: There is Cheyeanne Roadcap new moderate-sized left pneumothorax. Patient is rotated. No definite mediastinal shift. There are patchy opacities in the left lower lung and left mid lung likely related to nodular density seen on CT and atelectasis. No pleural effusion. Cardiomediastinal silhouette appears within normal limits. No acute fractures. IMPRESSION: 1. New moderate-sized left pneumothorax. No definite mediastinal shift. 2. Patchy opacities in the left lower lung and left mid lung likely related to nodular density seen on CT and atelectasis. Electronically Signed: By: Greig Pique M.D. On: 07/09/2023 16:57   DG CHEST PORT 1 VIEW Result Date: 07/09/2023 CLINICAL DATA:  Status post bronchoscopy EXAM: PORTABLE CHEST 1 VIEW COMPARISON:  07/06/2023 FINDINGS: Cardiac shadow is stable. Aortic calcifications are seen. Lungs are well aerated bilaterally. Some slight increase in  airspace opacity is noted surrounding the rounded area of density seen on the prior exam. This may represent some post bronchoscopy hemorrhage. Additionally Hezikiah Retzloff small apical and lateral left-sided pneumothorax is noted. Less than 1 cm excursion is noted. IMPRESSION: Small left pneumothorax following bronchoscopy. Slight increased density is noted in the left mid lung which may be related to post biopsy hemorrhage. These results will be called to the ordering clinician or representative by the Radiologist Assistant, and communication documented in the PACS or [employer]. Electronically Signed   By: Oneil Devonshire M.D.   On: 07/09/2023 15:26   DG C-ARM BRONCHOSCOPY Result Date: 07/09/2023 C-ARM BRONCHOSCOPY: Fluoroscopy was utilized by the requesting physician.  No radiographic interpretation.   ECHOCARDIOGRAM COMPLETE Result Date: 07/08/2023    ECHOCARDIOGRAM REPORT   Patient Name:   BERYL HORNBERGER Date of Exam: 07/08/2023 Medical Rec #:  [id number]       Height:       63.0 in Accession #:    [id number]      Weight:       86.7 lb Date of Birth:  1965-10-23       BSA:          1.355 m Patient Age:    57 years        BP:           121/81 mmHg Patient Gender: F               HR:  80 bpm. Exam Location:  Inpatient Procedure: 2D Echo, Cardiac Doppler and Color Doppler Indications:    Abnormal ECG  History:        Patient has prior history of Echocardiogram examinations, most                 recent 12/29/2022. Signs/Symptoms:Shortness of Breath and                 Dyspnea; Risk Factors:Hypertension, Diabetes and Former Smoker.  Sonographer:    Tillman Nora RVT RCS Referring Phys: [phone] Tabita Corbo CALDWELL POWELL JR IMPRESSIONS  1. Left ventricular ejection fraction, by estimation, is 60 to 65%. The left ventricle has normal function. The left ventricle has no regional wall motion abnormalities. Left ventricular diastolic parameters are consistent with Grade I diastolic dysfunction (impaired relaxation).  2. Right  ventricular systolic function is normal. The right ventricular size is normal. There is normal pulmonary artery systolic pressure.  3. There is no evidence of cardiac tamponade.  4. The mitral valve is normal in structure. Trivial mitral valve regurgitation. No evidence of mitral stenosis.  5. The aortic valve is normal in structure. Aortic valve regurgitation is not visualized. No aortic stenosis is present.  6. The inferior vena cava is normal in size with greater than 50% respiratory variability, suggesting right atrial pressure of 3 mmHg. FINDINGS  Left Ventricle: Left ventricular ejection fraction, by estimation, is 60 to 65%. The left ventricle has normal function. The left ventricle has no regional wall motion abnormalities. The left ventricular internal cavity size was normal in size. There is  no left ventricular hypertrophy. Left ventricular diastolic parameters are consistent with Grade I diastolic dysfunction (impaired relaxation). Right Ventricle: The right ventricular size is normal. No increase in right ventricular wall thickness. Right ventricular systolic function is normal. There is normal pulmonary artery systolic pressure. The tricuspid regurgitant velocity is 1.98 m/s, and  with an assumed right atrial pressure of 3 mmHg, the estimated right ventricular systolic pressure is 18.7 mmHg. Left Atrium: Left atrial size was normal in size. Right Atrium: Right atrial size was normal in size. Pericardium: There is no evidence of pericardial effusion. The pericardial effusion is anterior to the right ventricle. There is no evidence of cardiac tamponade. Mitral Valve: The mitral valve is normal in structure. Trivial mitral valve regurgitation. No evidence of mitral valve stenosis. Tricuspid Valve: The tricuspid valve is normal in structure. Tricuspid valve regurgitation is trivial. No evidence of tricuspid stenosis. Aortic Valve: The aortic valve is normal in structure. Aortic valve regurgitation is not  visualized. No aortic stenosis is present. Aortic valve mean gradient measures 3.0 mmHg. Aortic valve peak gradient measures 5.7 mmHg. Aortic valve area, by VTI measures 2.07 cm. Pulmonic Valve: The pulmonic valve was normal in structure. Pulmonic valve regurgitation is not visualized. No evidence of pulmonic stenosis. Aorta: The aortic root is normal in size and structure. Venous: The inferior vena cava is normal in size with greater than 50% respiratory variability, suggesting right atrial pressure of 3 mmHg. IAS/Shunts: No atrial level shunt detected by color flow Doppler.  LEFT VENTRICLE PLAX 2D LVIDd:         3.70 cm   Diastology LVIDs:         2.40 cm   LV e' medial:    7.18 cm/s LV PW:         1.10 cm   LV E/e' medial:  10.4 LV IVS:        0.80 cm  LV e' lateral:   7.62 cm/s LVOT diam:     1.80 cm   LV E/e' lateral: 9.8 LV SV:         49 LV SV Index:   36 LVOT Area:     2.54 cm  RIGHT VENTRICLE          IVC RV Basal diam:  2.70 cm  IVC diam: 1.90 cm RV Mid diam:    2.20 cm LEFT ATRIUM             Index        RIGHT ATRIUM          Index LA diam:        3.20 cm 2.36 cm/m   RA Area:     9.63 cm LA Vol (A2C):   39.7 ml 29.29 ml/m  RA Volume:   22.60 ml 16.67 ml/m LA Vol (A4C):   34.0 ml 25.08 ml/m LA Biplane Vol: 41.9 ml 30.91 ml/m  AORTIC VALVE                    PULMONIC VALVE AV Area (Vmax):    2.03 cm     PV Vmax:       0.70 m/s AV Area (Vmean):   2.01 cm     PV Peak grad:  1.9 mmHg AV Area (VTI):     2.07 cm AV Vmax:           119.50 cm/s AV Vmean:          82.400 cm/s AV VTI:            0.238 m AV Peak Grad:      5.7 mmHg AV Mean Grad:      3.0 mmHg LVOT Vmax:         95.50 cm/s LVOT Vmean:        65.100 cm/s LVOT VTI:          0.193 m LVOT/AV VTI ratio: 0.81  AORTA Ao Root diam: 3.20 cm MITRAL VALVE               TRICUSPID VALVE MV Area (PHT): 3.68 cm    TR Peak grad:   15.7 mmHg MV Decel Time: 206 msec    TR Vmax:        198.00 cm/s MV E velocity: 74.60 cm/s MV Nana Hoselton velocity: 75.40 cm/s  SHUNTS  MV E/Kaiesha Tonner ratio:  0.99        Systemic VTI:  0.19 m                            Systemic Diam: 1.80 cm Kardie Tobb DO Electronically signed by Kardie Tobb DO Signature Date/Time: 07/08/2023/12:15:13 PM    Final         Scheduled Meds:  acetaminophen   1,000 mg Oral Q8H   Chlorhexidine  Gluconate Cloth  6 each Topical Daily   enoxaparin  (LOVENOX ) injection  30 mg Subcutaneous Q24H   feeding supplement  1 Container Oral TID BM   fluticasone   2 spray Each Nare Daily   insulin  aspart  0-5 Units Subcutaneous QHS   insulin  aspart  0-9 Units Subcutaneous TID WC   insulin  aspart  8 Units Subcutaneous TID WC   insulin  glargine-yfgn  12 Units Subcutaneous Daily   sodium chloride  flush  10 mL Intrapleural Q8H   sodium chloride  flush  3 mL Intravenous Q12H   Continuous Infusions:  sodium  chloride 10 mL/hr at 07/09/23 1800   cefTRIAXone  (ROCEPHIN )  IV Stopped (07/09/23 1652)     LOS: 4 days    Time spent: over 30 min    Meliton Monte, MD [facility]   To contact the attending provider between 7A-7P or the covering provider during after hours 7P-7A, please log into the web site [email or web address] and access using universal [place] password for that web site. If you do not have the password, please call the hospital operator.  07/10/2023, 8:37 AM

## 2023-07-11 ENCOUNTER — Inpatient Hospital Stay (HOSPITAL_COMMUNITY): Payer: 59

## 2023-07-11 DIAGNOSIS — J939 Pneumothorax, unspecified: Secondary | ICD-10-CM | POA: Diagnosis not present

## 2023-07-11 DIAGNOSIS — J189 Pneumonia, unspecified organism: Secondary | ICD-10-CM | POA: Diagnosis not present

## 2023-07-11 DIAGNOSIS — A419 Sepsis, unspecified organism: Secondary | ICD-10-CM | POA: Diagnosis not present

## 2023-07-11 LAB — PHOSPHORUS: Phosphorus: 2.9 mg/dL (ref 2.5–4.6)

## 2023-07-11 LAB — ACID FAST SMEAR (AFB, MYCOBACTERIA)
Acid Fast Smear: NEGATIVE
Acid Fast Smear: NEGATIVE
Acid Fast Smear: NEGATIVE
Acid Fast Smear: NEGATIVE

## 2023-07-11 LAB — BASIC METABOLIC PANEL
Anion gap: 8 (ref 5–15)
BUN: 17 mg/dL (ref 6–20)
CO2: 30 mmol/L (ref 22–32)
Calcium: 8.3 mg/dL — ABNORMAL LOW (ref 8.9–10.3)
Chloride: 98 mmol/L (ref 98–111)
Creatinine, Ser: 0.36 mg/dL — ABNORMAL LOW (ref 0.44–1.00)
GFR, Estimated: >60 mL/min (ref >60)
Glucose, Bld: 310 mg/dL — ABNORMAL HIGH (ref 70–99)
Potassium: 4.5 mmol/L (ref 3.5–5.1)
Sodium: 136 mmol/L (ref 135–145)

## 2023-07-11 LAB — CULTURE, BLOOD (ROUTINE X 2)
Culture: NO GROWTH
Culture: NO GROWTH

## 2023-07-11 LAB — CBC
HCT: 44.2 % (ref 36.0–46.0)
Hemoglobin: 13.8 g/dL (ref 12.0–15.0)
MCH: 31 pg (ref 26.0–34.0)
MCHC: 31.2 g/dL (ref 30.0–36.0)
MCV: 99.3 fL (ref 80.0–100.0)
Platelets: 359 10*3/uL (ref 150–400)
RBC: 4.45 MIL/uL (ref 3.87–5.11)
RDW: 12.7 % (ref 11.5–15.5)
WBC: 11.1 10*3/uL — ABNORMAL HIGH (ref 4.0–10.5)
nRBC: 0 % (ref 0.0–0.2)

## 2023-07-11 LAB — GLUCOSE, CAPILLARY
Glucose-Capillary: 259 mg/dL — ABNORMAL HIGH (ref 70–99)
Glucose-Capillary: 250 mg/dL — ABNORMAL HIGH (ref 70–99)
Glucose-Capillary: 397 mg/dL — ABNORMAL HIGH (ref 70–99)
Glucose-Capillary: 210 mg/dL — ABNORMAL HIGH (ref 70–99)

## 2023-07-11 LAB — MAGNESIUM: Magnesium: 1.8 mg/dL (ref 1.7–2.4)

## 2023-07-11 MED ORDER — INSULIN ASPART 100 UNIT/ML IJ SOLN
10.0000 [IU] | Freq: Three times a day (TID) | INTRAMUSCULAR | Status: DC
  Administered 2023-07-11 – 2023-07-18 (×12): 10 [IU] via SUBCUTANEOUS

## 2023-07-11 MED ORDER — AMLODIPINE BESYLATE 5 MG PO TABS
5.0000 mg | ORAL_TABLET | Freq: Every day | ORAL | Status: DC
Start: 2023-07-11 — End: 2023-07-13
  Administered 2023-07-11 – 2023-07-13 (×3): 5 mg via ORAL
  Filled 2023-07-11 (×3): qty 1

## 2023-07-11 MED ORDER — LOSARTAN POTASSIUM 25 MG PO TABS
25.0000 mg | ORAL_TABLET | Freq: Every day | ORAL | Status: DC
Start: 2023-07-11 — End: 2023-07-21
  Administered 2023-07-11 – 2023-07-21 (×11): 25 mg via ORAL
  Filled 2023-07-11 (×11): qty 1

## 2023-07-11 MED ORDER — INSULIN GLARGINE-YFGN 100 UNIT/ML ~~LOC~~ SOLN
16.0000 [IU] | Freq: Every day | SUBCUTANEOUS | Status: DC
  Administered 2023-07-11 – 2023-07-12 (×2): 16 [IU] via SUBCUTANEOUS
  Filled 2023-07-11 (×3): qty 0.16

## 2023-07-11 MED ORDER — IPRATROPIUM-ALBUTEROL 0.5-2.5 (3) MG/3ML IN SOLN
3.0000 mL | Freq: Four times a day (QID) | RESPIRATORY_TRACT | Status: DC
Start: 2023-07-11 — End: 2023-07-15
  Administered 2023-07-11 – 2023-07-15 (×11): 3 mL via RESPIRATORY_TRACT
  Filled 2023-07-11 (×14): qty 3

## 2023-07-11 NOTE — Progress Notes (Signed)
   NAME:  Courtney Bennett, MRN:  [id number], DOB:  Sep 30, 1965, LOS: 5 ADMISSION DATE:  07/06/2023, CONSULTATION DATE:  07/11/2023 REFERRING MD:  Perri DELENA Meliton Mickey., *, CHIEF COMPLAINT:  pneumonia   History of Present Illness:  Courtney Bennett is a 58 year old unhoused woman with past medical history of COPD ongoing tobacco use disorder who presents with 2 weeks of worsening cough, shortness of breath, weakness, fatigue.  She is not bringing up significant sputum.  She notes that she has unintentionally lost weight who is 15 pounds over the last few months.  She has night sweats.  She denies hemoptysis.  Pertinent  Medical History  COPD Bipolar disorder, with paranoia and psychotic features Type 1 diabetes mellitus   Significant Hospital Events: Including procedures, antibiotic start and stop dates in addition to other pertinent events   2/7 Post bronchoscopy with transbronchial biopsies c/b small left pneumothorax. Transferred to ICU for respiratory distress 2/8 remained on suction with persistent air leak, no pneumothorax on chest x-ray  Interim History / Subjective:  Airleak persist on suction.  Placed on waterseal.  Noted airleak.  Repeat chest x-ray stable on waterseal, no pneumothorax to my eye.  Objective   Blood pressure (!) 171/76, pulse 94, temperature 98.7 F (37.1 C), temperature source Oral, resp. rate 15, height 5' 3 (1.6 m), weight 64.5 kg, last menstrual period 11/24/2014, SpO2 99%.        Intake/Output Summary (Last 24 hours) at 07/11/2023 1213 Last data filed at 07/11/2023 0600 Gross per 24 hour  Intake 478.42 ml  Output 1180 ml  Net -701.58 ml   Filed Weights   07/06/23 1519 07/06/23 1900 07/09/23 1541  Weight: 44 kg 39.3 kg 64.5 kg   Physical Exam: General: Frail, cachectic-appearing, in no distress HENT: [place], AT, OP clear, MMM Eyes: EOMI, no scleral icterus Respiratory: Normal work of breathing  Cardiovascular: RRR,  no  JVD Extremities:-Edema,-tenderness Neuro: AAO x4, CNII-XII grossly intact   Resolved Hospital Problem list     Assessment & Plan:   Severe sepsis secondary pneumonia Abnormal CT chest -concerning for atypical infection  COPD with ongoing tobacco use disorder Constitutional symptoms including night sweats and weight loss --empiric community-acquired pneumonia Antibiotic therapy at this time (completed course of azithromycin , continue ceftriaxone  --AFB smear sputum 2/6 and 2/7 expectorated negative - 2/7 BAL pending, 2/7 bronchial brush x 2 pending, 2/7 pathology pending --BAL cell counts consistent with likely bacterial pathogen, PMN predominant --BAL Gram stain no organisms, pending pending --strep pneumo antigen negative. Legionella in process --AFB sputum rule outs and airborne isolation at this time, suspicion for TB is low  Iatrogenic left pneumothorax  -- Continue chest tube, Daily chest x-ray while in place -- Chest tube to waterseal 2/9 (order updated), repeat chest x-ray afterwards demonstrates no pneumothorax, no sizable pneumothorax to my eye --Routine chest tube care  Left lateral chest pain: Pleuritic in nature.  Same site as infiltrates.  A bit worse post bronc with pneumothorax. -- Scheduled Tylenol , as needed NSAIDs IV (intolerance of oxycodone , nausea vomiting) -- Lidocaine  patch added 2/8  PCCM will continue to follow for management of pneumothorax, chest tube  Critical care time: n/a     Courtney JONELLE Beals, MD [facility] Pulmonary/Critical Care Medicine 07/11/2023 12:13 PM   Please see Amion for pager number to reach on-call Pulmonary and Critical Care Team.

## 2023-07-11 NOTE — Progress Notes (Addendum)
 PROGRESS NOTE    Courtney Bennett  FMW:[id number] DOB: [birth year] DOA: 07/06/2023 PCP: Patient, No Pcp Per  Chief Complaint  Patient presents with   Weakness    Brief Narrative:   58 yo with T1DM presenting with cough and shortness of breath.  She's been admitted with CAP.    Assessment & Plan:   Principal Problem:   Community acquired pneumonia of left upper lobe of lung Active Problems:   DM (diabetes mellitus) (HCC)  Sepsis due to Community Acquired Pneumonia Acute Hypoxic Respiratory Failure Met criteria for sepsis on admission with tachycardia, leukocytosis and CXR concerning for pneumonia Currently on simple mask Continue CAP coverage with ceftriaxone  (plan for at least 7 days), azithromycin  (completed 3 days)  CT chest with multifocal pneumonia, atypical in etiology Appreciate pulmonology assistance s/p bronch 2/7, follow results Cytology pending Cultures pending Pending pathology AFB smears/cultures pending 2/6 AFB smear negative Airborne precautions, AFB smears/culture Follow blood cultures Negative covid, flu, RSV MRSA PCR negative, urine strep negative, urine legionella negative Continue airborne precautions  Post Bronch Pneumothorax S/p chest tube per pulm  CXR pending 2/9  Pleuritic Chest Pain Due to above  Anasarca Suspect at least partially related to hypoalbuminemia Echo with EF 60-65%, no RWMA, grade 1 diastolic dysfunction Will follow UA -> negative protein on UA  T1DM Currently on basal bolus (sounds like she's not taking appropriately at home) A1c 13.1 06/2023 Will follow and adjust as appropriate  Cachexia Could be from chronically poorly controlled diabetes, food insecurity (she suggests this as the reason) Needs additional evaluation/workup outpatient    DVT prophylaxis: lovenox  Code Status: full Family Communication: none Disposition:   Status is: Inpatient Remains inpatient appropriate because: need for continued inpatient  care   Consultants:  none  Procedures:  none  Antimicrobials:  Anti-infectives (From admission, onward)    Start     Dose/Rate Route Frequency Ordered Stop   07/07/23 1600  azithromycin  (ZITHROMAX ) 500 mg in sodium chloride  0.9 % 250 mL IVPB        500 mg 250 mL/hr over 60 Minutes Intravenous Every 24 hours 07/06/23 1748 07/08/23 1913   07/07/23 1600  cefTRIAXone  (ROCEPHIN ) 2 g in sodium chloride  0.9 % 100 mL IVPB        2 g 200 mL/hr over 30 Minutes Intravenous Every 24 hours 07/06/23 1748 07/12/23 1559   07/06/23 1600  cefTRIAXone  (ROCEPHIN ) 2 g in sodium chloride  0.9 % 100 mL IVPB        2 g 200 mL/hr over 30 Minutes Intravenous Once 07/06/23 1555 07/06/23 1655   07/06/23 1600  azithromycin  (ZITHROMAX ) 500 mg in sodium chloride  0.9 % 250 mL IVPB        500 mg 250 mL/hr over 60 Minutes Intravenous  Once 07/06/23 1555 07/06/23 1724       Subjective: No new complaints  Objective: Vitals:   07/11/23 0330 07/11/23 0400 07/11/23 0500 07/11/23 0600  BP:  (!) 161/69 (!) 178/85 (!) 176/72  Pulse: 80 81 84 87  Resp: 15 (!) 7 (!) 8 (!) 24  Temp:  98.4 F (36.9 C)    TempSrc:  Oral    SpO2: 99% 100% 100% 100%  Weight:      Height:        Intake/Output Summary (Last 24 hours) at 07/11/2023 0847 Last data filed at 07/11/2023 0600 Gross per 24 hour  Intake 478.42 ml  Output 1180 ml  Net -701.58 ml   Filed Weights   07/06/23  1519 07/06/23 1900 07/09/23 1541  Weight: 44 kg 39.3 kg 64.5 kg    Examination:  General: No acute distress. Cardiovascular: RRR Lungs: diminished, crackles on L - chest tube L - unlabored, simple mask around her neck rather than on her face as she's eating breakfast Neurological: Alert and oriented 3. Moves all extremities 4 . Cranial nerves II through XII grossly intact. Extremities: No clubbing or cyanosis. No edema.   Data Reviewed: I have personally reviewed following labs and imaging studies  CBC: Recent Labs  Lab 07/06/23 1606  07/06/23 1916 07/07/23 0409 07/08/23 0417 07/10/23 0248 07/11/23 0256  WBC 16.0* 15.5* 15.5* 11.4* 10.8* 11.1*  NEUTROABS 13.1*  --   --  8.8* 8.7*  --   HGB 17.2* 15.9* 14.7 14.1 13.3 13.8  HCT 53.8* 50.3* 47.2* 45.0 42.5 44.2  MCV 97.1 98.8 98.7 98.5 97.9 99.3  PLT 269 234 241 268 347 359    Basic Metabolic Panel: Recent Labs  Lab 07/06/23 2251 07/07/23 0409 07/08/23 0417 07/10/23 0248 07/11/23 0256  NA 132* 136 130* 134* 136  K 4.2 3.7 3.5 4.1 4.5  CL 98 101 96* 97* 98  CO2 20* 25 28 30 30   GLUCOSE 370* 293* 259* 225* 310*  BUN 12 9 14 13 17   CREATININE 0.34* <0.30* 0.33* <0.30* 0.36*  CALCIUM  8.1* 8.4* 8.1* 8.0* 8.3*  MG  --   --  1.8 1.7 1.8  PHOS  --   --  1.9* 3.4 2.9    GFR: Estimated Creatinine Clearance: 70.1 mL/min (Aniello Christopoulos) (by C-G formula based on SCr of 0.36 mg/dL (L)).  Liver Function Tests: Recent Labs  Lab 07/06/23 1606 07/08/23 0417 07/10/23 0248  AST 12* 10* 8*  ALT 16 11 10   ALKPHOS 76 63 51  BILITOT 1.5* 0.5 0.4  PROT 7.4 5.2* 5.1*  ALBUMIN 3.3* 2.1* 2.0*    CBG: Recent Labs  Lab 07/10/23 0815 07/10/23 1118 07/10/23 1639 07/10/23 2205 07/11/23 0814  GLUCAP 200* 301* 206* 234* 259*     Recent Results (from the past 240 hours)  Blood Culture (routine x 2)     Status: None (Preliminary result)   Collection Time: 07/06/23  4:06 PM   Specimen: BLOOD RIGHT ARM  Result Value Ref Range Status   Specimen Description   Final    BLOOD RIGHT ARM Performed at [facility], [address], [place], [place] [address]    Special Requests   Final    BOTTLES DRAWN AEROBIC AND ANAEROBIC Blood Culture results may not be optimal due to an inadequate volume of blood received in culture bottles Performed at [facility], [address], [place], [place] [address]    Culture   Final    NO GROWTH 4 DAYS Performed at [facility], [address], [place], [place] [address]    Report Status PENDING  Incomplete  Blood  Culture (routine x 2)     Status: None (Preliminary result)   Collection Time: 07/06/23  4:09 PM   Specimen: BLOOD LEFT ARM  Result Value Ref Range Status   Specimen Description   Final    BLOOD LEFT ARM Performed at [facility], [address], [place], [place] [address]    Special Requests   Final    BOTTLES DRAWN AEROBIC AND ANAEROBIC Blood Culture results may not be optimal due to an inadequate volume of blood received in culture bottles Performed at [facility],  [address], [place], [place] [address]    Culture   Final    NO GROWTH 4 DAYS Performed at [facility], [address], [place], [place] [address]    Report Status PENDING  Incomplete  Resp panel by RT-PCR (RSV, Flu Nonie Lochner&B, Covid) Anterior Nasal Swab     Status: None   Collection Time: 07/06/23 10:52 PM   Specimen: Anterior Nasal Swab  Result Value Ref Range Status   SARS Coronavirus 2 by RT PCR NEGATIVE NEGATIVE Final    Comment: (NOTE) SARS-CoV-2 target nucleic acids are NOT DETECTED.  The SARS-CoV-2 RNA is generally detectable in upper respiratory specimens during the acute phase of infection. The lowest concentration of SARS-CoV-2 viral copies this assay can detect is 138 copies/mL. Tyren Dugar negative result does not preclude SARS-Cov-2 infection and should not be used as the sole basis for treatment or other patient management decisions. Cristian Grieves negative result may occur with  improper specimen collection/handling, submission of specimen other than nasopharyngeal swab, presence of viral mutation(s) within the areas targeted by this assay, and inadequate number of viral copies(<138 copies/mL). Maveryk Renstrom negative result must be combined with clinical observations, patient history, and epidemiological information. The expected result is Negative.  Fact Sheet for Patients:  bloggercourse.com  Fact Sheet for Healthcare Providers:   seriousbroker.it  This test is no t yet approved or cleared by the [place]  FDA and  has been authorized for detection and/or diagnosis of SARS-CoV-2 by FDA under an Emergency Use Authorization (EUA). This EUA will remain  in effect (meaning this test can be used) for the duration of the COVID-19 declaration under Section 564(b)(1) of the Act, 21 U.S.C.section 360bbb-3(b)(1), unless the authorization is terminated  or revoked sooner.       Influenza Ashari Llewellyn by PCR NEGATIVE NEGATIVE Final   Influenza B by PCR NEGATIVE NEGATIVE Final    Comment: (NOTE) The Xpert Xpress SARS-CoV-2/FLU/RSV plus assay is intended as an aid in the diagnosis of influenza from Nasopharyngeal swab specimens and should not be used as Echo Propp sole basis for treatment. Nasal washings and aspirates are unacceptable for Xpert Xpress SARS-CoV-2/FLU/RSV testing.  Fact Sheet for Patients: bloggercourse.com  Fact Sheet for Healthcare Providers: seriousbroker.it  This test is not yet approved or cleared by the [place]  FDA and has been authorized for detection and/or diagnosis of SARS-CoV-2 by FDA under an Emergency Use Authorization (EUA). This EUA will remain in effect (meaning this test can be used) for the duration of the COVID-19 declaration under Section 564(b)(1) of the Act, 21 U.S.C. section 360bbb-3(b)(1), unless the authorization is terminated or revoked.     Resp Syncytial Virus by PCR NEGATIVE NEGATIVE Final    Comment: (NOTE) Fact Sheet for Patients: bloggercourse.com  Fact Sheet for Healthcare Providers: seriousbroker.it  This test is not yet approved or cleared by the [place]  FDA and has been authorized for detection and/or diagnosis of SARS-CoV-2 by FDA under an Emergency Use Authorization (EUA). This EUA will remain in effect (meaning this test can be used) for  the duration of the COVID-19 declaration under Section 564(b)(1) of the Act, 21 U.S.C. section 360bbb-3(b)(1), unless the authorization is terminated or revoked.  Performed at [facility], [address] [place], [place] [address]   MRSA Next Gen by PCR, Nasal     Status: None   Collection Time: 07/07/23 11:25 AM  Result Value Ref Range Status   MRSA by PCR Next Gen NOT DETECTED NOT DETECTED Final  Comment: (NOTE) The GeneXpert MRSA Assay (FDA approved for NASAL specimens only), is one component of Adrianna Dudas comprehensive MRSA colonization surveillance program. It is not intended to diagnose MRSA infection nor to guide or monitor treatment for MRSA infections. Test performance is not FDA approved in patients less than 72 years old. Performed at [facility], [address], [place], [place] [address]   Respiratory (~20 pathogens) panel by PCR     Status: None   Collection Time: 07/08/23  7:37 AM   Specimen: Nasopharyngeal Swab; Respiratory  Result Value Ref Range Status   Adenovirus NOT DETECTED NOT DETECTED Final   Coronavirus 229E NOT DETECTED NOT DETECTED Final    Comment: (NOTE) The Coronavirus on the Respiratory Panel, DOES NOT test for the novel  Coronavirus (2019 nCoV)    Coronavirus HKU1 NOT DETECTED NOT DETECTED Final   Coronavirus NL63 NOT DETECTED NOT DETECTED Final   Coronavirus OC43 NOT DETECTED NOT DETECTED Final   Metapneumovirus NOT DETECTED NOT DETECTED Final   Rhinovirus / Enterovirus NOT DETECTED NOT DETECTED Final   Influenza Karsin Pesta NOT DETECTED NOT DETECTED Final   Influenza B NOT DETECTED NOT DETECTED Final   Parainfluenza Virus 1 NOT DETECTED NOT DETECTED Final   Parainfluenza Virus 2 NOT DETECTED NOT DETECTED Final   Parainfluenza Virus 3 NOT DETECTED NOT DETECTED Final   Parainfluenza Virus 4 NOT DETECTED NOT DETECTED Final   Respiratory Syncytial Virus NOT DETECTED NOT DETECTED Final   Bordetella pertussis NOT DETECTED  NOT DETECTED Final   Bordetella Parapertussis NOT DETECTED NOT DETECTED Final   Chlamydophila pneumoniae NOT DETECTED NOT DETECTED Final   Mycoplasma pneumoniae NOT DETECTED NOT DETECTED Final    Comment: Performed at [facility], [address] [place], [place] [address]  Acid Fast Smear (AFB)     Status: None   Collection Time: 07/08/23  9:00 AM   Specimen: Sputum  Result Value Ref Range Status   AFB Specimen Processing Concentration  Final   Acid Fast Smear Negative  Final    Comment: (NOTE) Performed At: [facility] [address] [place], [place] [id number] Jennette Shorter MD Ey:[id number]    Source (AFB) SPUTUM  Final    Comment: Performed at [facility], [address] [place], [place] [address]  Acid Fast Smear (AFB)     Status: None   Collection Time: 07/09/23  8:16 AM   Specimen: Sputum  Result Value Ref Range Status   AFB Specimen Processing Concentration  Final   Acid Fast Smear Negative  Final    Comment: (NOTE) Performed At: [facility] [address] [place], [place] [id number] Jennette Shorter MD Ey:[id number]    Source (AFB) SPUTUM  Final    Comment: Performed at [facility], [address], [place], [place] [address]  Culture, BAL-quantitative w Gram Stain     Status: None (Preliminary result)   Collection Time: 07/09/23  1:55 PM   Specimen: Bronchial Alveolar Lavage; Respiratory  Result Value Ref Range Status   Specimen Description   Final    BRONCHIAL ALVEOLAR LAVAGE SPECIMEN Nelma Phagan Performed at [facility], [address] [place], [place] [address]    Special Requests   Final    NONE Performed at [facility], [address] [place], [place] [address]    Gram Stain   Final    RARE WBC PRESENT, PREDOMINANTLY PMN NO ORGANISMS SEEN    Culture   Final  NO GROWTH < 24 HOURS Performed at [facility], [address], [place], [place] [address]     Report Status PENDING  Incomplete  Aerobic/Anaerobic Culture w Gram Stain (surgical/deep wound)     Status: None (Preliminary result)   Collection Time: 07/09/23  2:06 PM   Specimen: PATH Respiratory biopsy; Tissue  Result Value Ref Range Status   Specimen Description BIOPSY  Final   Special Requests LINGULA TRANSBRONCHIAL BIOSPIES SAMPLE D  Final   Gram Stain NO WBC SEEN NO ORGANISMS SEEN   Final   Culture   Final    NO GROWTH < 24 HOURS Performed at [facility], [address], [place], [place] [address]    Report Status PENDING  Incomplete         Radiology Studies: DG CHEST PORT 1 VIEW Result Date: 07/10/2023 CLINICAL DATA:  Pneumothorax follow-up EXAM: PORTABLE CHEST - 1 VIEW COMPARISON:  07/09/2023 FINDINGS: Cardiomediastinal silhouette and pulmonary vasculature are within normal limits. Left basilar chest tube is unchanged in position. No residual pneumothorax is seen. Mild subcutaneous emphysema of the left axilla and chest wall similar to prior exam. Minimal right pleural effusion is unchanged. Mild bibasilar atelectasis again noted. IMPRESSION: No residual left pneumothorax. Left basilar chest tube unchanged in position. Electronically Signed   By: Aliene Lloyd M.D.   On: 07/10/2023 09:34   DG CHEST PORT 1 VIEW Result Date: 07/09/2023 CLINICAL DATA:  Chest tube EXAM: PORTABLE CHEST 1 VIEW COMPARISON:  07/09/2023 FINDINGS: Interval placement of left chest tube. Re-expansion of the left lung. No visible pneumothorax. Subcutaneous emphysema within the left chest wall. Heart and mediastinal contours within normal limits. Bilateral airspace opacities similar to prior study. IMPRESSION: Interval placement of left chest tube with no visible pneumothorax. Patchy bilateral airspace disease. Electronically Signed   By: Franky Crease M.D.   On: 07/09/2023 21:07   DG CHEST PORT 1 VIEW Addendum Date: 07/09/2023 ADDENDUM REPORT: 07/09/2023 19:19 ADDENDUM: These results were called by  telephone at the time of interpretation on 07/09/2023 at 4:01 pm to provider CHI ELLISON , who verbally acknowledged these results. Electronically Signed   By: Greig Pique M.D.   On: 07/09/2023 19:19   Result Date: 07/09/2023 CLINICAL DATA:  Status post bronchoscopy EXAM: PORTABLE CHEST 1 VIEW COMPARISON:  Chest x-ray 07/09/2023.  Chest CT 07/07/2023. FINDINGS: There is Saleah Rishel new moderate-sized left pneumothorax. Patient is rotated. No definite mediastinal shift. There are patchy opacities in the left lower lung and left mid lung likely related to nodular density seen on CT and atelectasis. No pleural effusion. Cardiomediastinal silhouette appears within normal limits. No acute fractures. IMPRESSION: 1. New moderate-sized left pneumothorax. No definite mediastinal shift. 2. Patchy opacities in the left lower lung and left mid lung likely related to nodular density seen on CT and atelectasis. Electronically Signed: By: Greig Pique M.D. On: 07/09/2023 16:57   DG CHEST PORT 1 VIEW Result Date: 07/09/2023 CLINICAL DATA:  Status post bronchoscopy EXAM: PORTABLE CHEST 1 VIEW COMPARISON:  07/06/2023 FINDINGS: Cardiac shadow is stable. Aortic calcifications are seen. Lungs are well aerated bilaterally. Some slight increase in airspace opacity is noted surrounding the rounded area of density seen on the prior exam. This may represent some post bronchoscopy hemorrhage. Additionally Carroll Ranney small apical and lateral left-sided pneumothorax is noted. Less than 1 cm excursion is noted. IMPRESSION: Small left pneumothorax following bronchoscopy. Slight increased density is noted in the left mid lung which may be related to post  biopsy hemorrhage. These results will be called to the ordering clinician or representative by the Radiologist Assistant, and communication documented in the PACS or [employer]. Electronically Signed   By: Oneil Devonshire M.D.   On: 07/09/2023 15:26   DG C-ARM BRONCHOSCOPY Result Date: 07/09/2023 C-ARM  BRONCHOSCOPY: Fluoroscopy was utilized by the requesting physician.  No radiographic interpretation.        Scheduled Meds:  acetaminophen   1,000 mg Oral Q8H   Chlorhexidine  Gluconate Cloth  6 each Topical Daily   enoxaparin  (LOVENOX ) injection  40 mg Subcutaneous Q24H   feeding supplement  1 Container Oral TID BM   fluticasone   2 spray Each Nare Daily   insulin  aspart  0-5 Units Subcutaneous QHS   insulin  aspart  0-9 Units Subcutaneous TID WC   insulin  aspart  10 Units Subcutaneous TID WC   insulin  glargine-yfgn  16 Units Subcutaneous Daily   lidocaine   1 patch Transdermal Daily   sodium chloride  flush  10 mL Intrapleural Q8H   sodium chloride  flush  3 mL Intravenous Q12H   Continuous Infusions:  cefTRIAXone  (ROCEPHIN )  IV Stopped (07/10/23 1816)     LOS: 5 days    Time spent: over 30 min    Meliton Monte, MD [facility]   To contact the attending provider between 7A-7P or the covering provider during after hours 7P-7A, please log into the web site [email or web address] and access using universal [place] password for that web site. If you do not have the password, please call the hospital operator.  07/11/2023, 8:47 AM

## 2023-07-12 ENCOUNTER — Inpatient Hospital Stay (HOSPITAL_COMMUNITY): Payer: 59

## 2023-07-12 DIAGNOSIS — J939 Pneumothorax, unspecified: Secondary | ICD-10-CM | POA: Diagnosis not present

## 2023-07-12 DIAGNOSIS — J189 Pneumonia, unspecified organism: Secondary | ICD-10-CM | POA: Diagnosis not present

## 2023-07-12 LAB — CBC WITH DIFFERENTIAL/PLATELET
Abs Immature Granulocytes: 0.06 10*3/uL (ref 0.00–0.07)
Basophils Absolute: 0 10*3/uL (ref 0.0–0.1)
Basophils Relative: 0 %
Eosinophils Absolute: 0.1 10*3/uL (ref 0.0–0.5)
Eosinophils Relative: 1 %
HCT: 43.1 % (ref 36.0–46.0)
Hemoglobin: 13.4 g/dL (ref 12.0–15.0)
Immature Granulocytes: 1 %
Lymphocytes Relative: 13 %
Lymphs Abs: 1.3 10*3/uL (ref 0.7–4.0)
MCH: 30.9 pg (ref 26.0–34.0)
MCHC: 31.1 g/dL (ref 30.0–36.0)
MCV: 99.5 fL (ref 80.0–100.0)
Monocytes Absolute: 1 10*3/uL (ref 0.1–1.0)
Monocytes Relative: 10 %
Neutro Abs: 7.7 10*3/uL (ref 1.7–7.7)
Neutrophils Relative %: 75 %
Platelets: 371 10*3/uL (ref 150–400)
RBC: 4.33 MIL/uL (ref 3.87–5.11)
RDW: 12.7 % (ref 11.5–15.5)
WBC: 10.2 10*3/uL (ref 4.0–10.5)
nRBC: 0 % (ref 0.0–0.2)

## 2023-07-12 LAB — GLUCOSE, CAPILLARY
Glucose-Capillary: 220 mg/dL — ABNORMAL HIGH (ref 70–99)
Glucose-Capillary: 349 mg/dL — ABNORMAL HIGH (ref 70–99)
Glucose-Capillary: 210 mg/dL — ABNORMAL HIGH (ref 70–99)
Glucose-Capillary: 156 mg/dL — ABNORMAL HIGH (ref 70–99)

## 2023-07-12 LAB — COMPREHENSIVE METABOLIC PANEL
ALT: 11 U/L (ref 0–44)
AST: 13 U/L — ABNORMAL LOW (ref 15–41)
Albumin: 2.1 g/dL — ABNORMAL LOW (ref 3.5–5.0)
Alkaline Phosphatase: 54 U/L (ref 38–126)
Anion gap: 7 (ref 5–15)
BUN: 19 mg/dL (ref 6–20)
CO2: 30 mmol/L (ref 22–32)
Calcium: 8.2 mg/dL — ABNORMAL LOW (ref 8.9–10.3)
Chloride: 99 mmol/L (ref 98–111)
Creatinine, Ser: 0.41 mg/dL — ABNORMAL LOW (ref 0.44–1.00)
GFR, Estimated: >60 mL/min (ref >60)
Glucose, Bld: 267 mg/dL — ABNORMAL HIGH (ref 70–99)
Potassium: 4.6 mmol/L (ref 3.5–5.1)
Sodium: 136 mmol/L (ref 135–145)
Total Bilirubin: 0.5 mg/dL (ref 0.0–1.2)
Total Protein: 5.3 g/dL — ABNORMAL LOW (ref 6.5–8.1)

## 2023-07-12 LAB — MAGNESIUM: Magnesium: 1.8 mg/dL (ref 1.7–2.4)

## 2023-07-12 LAB — PHOSPHORUS: Phosphorus: 2.8 mg/dL (ref 2.5–4.6)

## 2023-07-12 MED ORDER — HYDRALAZINE HCL 20 MG/ML IJ SOLN
10.0000 mg | Freq: Four times a day (QID) | INTRAMUSCULAR | Status: DC | PRN
  Administered 2023-07-13: 10 mg via INTRAVENOUS
  Filled 2023-07-12: qty 1

## 2023-07-12 NOTE — Progress Notes (Deleted)
   07/12/23 1535  TOC Brief Assessment  Insurance and Status Reviewed  Patient has primary care physician No  Home environment has been reviewed yes  Prior level of function: Independent  Social Drivers of Health Review SDOH reviewed needs interventions (will add housing resources)  Readmission risk has been reviewed Yes  Transition of care needs transition of care needs identified, TOC will continue to follow

## 2023-07-12 NOTE — Plan of Care (Signed)
  Problem: Education: Goal: Ability to describe self-care measures that may prevent or decrease complications (Diabetes Survival Skills Education) will improve Outcome: Progressing Goal: Individualized Educational Video(s) Outcome: Progressing   Problem: Coping: Goal: Ability to adjust to condition or change in health will improve Outcome: Progressing   Problem: Fluid Volume: Goal: Ability to maintain a balanced intake and output will improve Outcome: Progressing   Problem: Health Behavior/Discharge Planning: Goal: Ability to identify and utilize available resources and services will improve Outcome: Progressing Goal: Ability to manage health-related needs will improve Outcome: Progressing   Problem: Metabolic: Goal: Ability to maintain appropriate glucose levels will improve Outcome: Progressing   Problem: Nutritional: Goal: Maintenance of adequate nutrition will improve Outcome: Progressing Goal: Progress toward achieving an optimal weight will improve Outcome: Progressing   Problem: Skin Integrity: Goal: Risk for impaired skin integrity will decrease Outcome: Progressing   Problem: Tissue Perfusion: Goal: Adequacy of tissue perfusion will improve Outcome: Progressing   Problem: Education: Goal: Knowledge of General Education information will improve Description: Including pain rating scale, medication(s)/side effects and non-pharmacologic comfort measures Outcome: Progressing   Problem: Health Behavior/Discharge Planning: Goal: Ability to manage health-related needs will improve Outcome: Progressing   Problem: Clinical Measurements: Goal: Ability to maintain clinical measurements within normal limits will improve Outcome: Progressing Goal: Will remain free from infection Outcome: Progressing Goal: Diagnostic test results will improve Outcome: Progressing Goal: Respiratory complications will improve Outcome: Progressing Goal: Cardiovascular complication will  be avoided Outcome: Progressing   Problem: Activity: Goal: Risk for activity intolerance will decrease Outcome: Progressing   Problem: Nutrition: Goal: Adequate nutrition will be maintained Outcome: Progressing   Problem: Coping: Goal: Level of anxiety will decrease Outcome: Progressing   Problem: Elimination: Goal: Will not experience complications related to bowel motility Outcome: Progressing Goal: Will not experience complications related to urinary retention Outcome: Progressing   Problem: Pain Managment: Goal: General experience of comfort will improve and/or be controlled Outcome: Progressing   Problem: Safety: Goal: Ability to remain free from injury will improve Outcome: Progressing   Problem: Skin Integrity: Goal: Risk for impaired skin integrity will decrease Outcome: Progressing   Problem: Fluid Volume: Goal: Hemodynamic stability will improve Outcome: Progressing   Problem: Clinical Measurements: Goal: Diagnostic test results will improve Outcome: Progressing Goal: Signs and symptoms of infection will decrease Outcome: Progressing   Problem: Respiratory: Goal: Ability to maintain adequate ventilation will improve Outcome: Progressing   Problem: Activity: Goal: Ability to tolerate increased activity will improve Outcome: Progressing   Problem: Clinical Measurements: Goal: Ability to maintain a body temperature in the normal range will improve Outcome: Progressing   Problem: Respiratory: Goal: Ability to maintain adequate ventilation will improve Outcome: Progressing Goal: Ability to maintain a clear airway will improve Outcome: Progressing

## 2023-07-12 NOTE — Progress Notes (Signed)
   07/12/23 1400  Spiritual Encounters  Type of Visit Initial  Care provided to: Patient  Referral source Chaplain team;Chaplain assessment;Clinical staff  Reason for visit Urgent spiritual support  OnCall Visit No  Spiritual Framework  Presenting Themes Meaning/purpose/sources of inspiration;Values and beliefs;Caregiving needs;Coping tools;Impactful experiences and emotions;Courage hope and growth;Rituals and practive;Community and relationships  Patient Stress Factors Financial concerns;Family relationships;Lack of caregivers;Lack of knowledge;Loss of control;Major life changes  Family Stress Factors None identified  Interventions  Spiritual Care Interventions Made Established relationship of care and support;Compassionate presence;Reflective listening;Normalization of emotions;Explored ethical dilemma;Explored values/beliefs/practices/strengths;Meaning making;Prayer   Chaplain met with Courtney Bennett this afternoon and provided spiritual care for her at this time.  Patient shared her life narrative and her immediate concerns at this time.  Spoke well of her faith and understanding and was open and honest with Chaplain about her struggles for stability and security and health challenges.  Chaplain provided comfort and counsel to her at this time.  At her request, Chaplain anointed her with oil and  prayed with her.

## 2023-07-12 NOTE — Progress Notes (Addendum)
 eLink Physician-Brief Progress Note Patient Name: Courtney Bennett DOB: 08-08-1965 MRN: [id number]   Date of Service  07/12/2023  HPI/Events of Note  58 year old unhoused woman with past medical history of COPD ongoing tobacco use disorder who presents with 2 weeks of worsening cough, shortness of breath, weakness, fatigue underwent bronchoscopy on 2/7 developed small left-sided pneumothorax post procedure.  Transition to waterseal 2/9 with persistent air leak.  Now having subcutaneous emphysema spreading throughout the left side of her neck.  On 100% 8 L mask with any symptoms.  eICU Interventions  Repeat chest x-ray pending.  Will likely need to go back on suction.   0351-no significant pneumothorax but expanding subcutaneous emphysema.  Will revert back to -20 suction.    Intervention Category Intermediate Interventions: Respiratory distress - evaluation and management  Therma Lasure 07/12/2023, 2:51 AM

## 2023-07-12 NOTE — Inpatient Diabetes Management (Signed)
Inpatient Diabetes Program Recommendations  AACE/ADA: New Consensus Statement on Inpatient Glycemic Control (2015)  Target Ranges:  Prepandial:   less than 140 mg/dL      Peak postprandial:   less than 180 mg/dL (1-2 hours)      Critically ill patients:  140 - 180 mg/dL   Lab Results  Component Value Date   GLUCAP 220 (H) 07/12/2023   HGBA1C 13.1 (H) 06/05/2023    Review of Glycemic Control  Diabetes history: DM2 Outpatient Diabetes medications: Humalog 8 units TID + correction insulin, MDD 50 units /day. Not taking Tresiba, not using FS Libre Current orders for Inpatient glycemic control: Semglee 16 every day, Novolog 0-9 TID and 0-5 HS + 10 units TID  Inpatient Diabetes Program Recommendations:    Consider increasing Semglee to 20 units daily  Consider increasing Novolog to 12 units TID if post-prandials continue > 180 mg/dL.  Spoke with RN regarding pt's intake and blood sugars on 2/10 afternoon.  Continue to follow.  Thank you. Ailene Ards, RD, LDN, CDCES Inpatient Diabetes Coordinator [phone]

## 2023-07-12 NOTE — Progress Notes (Signed)
   NAME:  Courtney Bennett, MRN:  [id number], DOB:  1966/02/02, LOS: 6 ADMISSION DATE:  07/06/2023, CONSULTATION DATE:  07/12/2023 REFERRING MD:  Etter Hermann., *, CHIEF COMPLAINT:  pneumonia   History of Present Illness:  Courtney Bennett is a 58 year old unhoused woman with past medical history of COPD ongoing tobacco use disorder who presents with 2 weeks of worsening cough, shortness of breath, weakness, fatigue.  She is not bringing up significant sputum.  She notes that she has unintentionally lost weight who is 15 pounds over the last few months.  She has night sweats.  She denies hemoptysis.  Pertinent  Medical History  COPD Bipolar disorder, with paranoia and psychotic features Type 1 diabetes mellitus  Significant Hospital Events: Including procedures, antibiotic start and stop dates in addition to other pertinent events   2/7 Post bronchoscopy with transbronchial biopsies c/b small left pneumothorax. Transferred to ICU for respiratory distress 2/8 remained on suction with persistent air leak, no pneumothorax on chest x-ray  Interim History / Subjective:  Increased chest wall and neck pressure last night so pigtail placed back on suction  Objective   Blood pressure (!) 154/77, pulse 84, temperature 98.7 F (37.1 C), temperature source Axillary, resp. rate 14, height 5\' 3"  (1.6 m), weight 64.5 kg, last menstrual period 11/24/2014, SpO2 90%.        Intake/Output Summary (Last 24 hours) at 07/12/2023 4098 Last data filed at 07/12/2023 0600 Gross per 24 hour  Intake 10 ml  Output 33 ml  Net -23 ml   Filed Weights   07/06/23 1519 07/06/23 1900 07/09/23 1541  Weight: 44 kg 39.3 kg 64.5 kg   Physical Exam: Frail no distress Moves to command SubQ crepitus L chest wall Moves to command Globally weak  Path still pending CXR showing worsening PTX/subQ emphysema after waterseal trial  Resolved Hospital Problem list     Assessment & Plan:   Severe sepsis secondary  pneumonia Abnormal CT chest -concerning for atypical infection  COPD with ongoing tobacco use disorder Constitutional symptoms including night sweats and weight loss - f/u bronch specimen data  Iatrogenic left pneumothorax  - failed waterseal trial 2/9 - back to suction, give 24h of suction again (no air leak that I see) then try again  Associated pleurisy- multimodal pain strategy as ordered  PCCM will continue to follow for management of pneumothorax, chest tube  Critical care time: n/a     [name], MD [facility] Pulmonary/Critical Care Medicine 07/12/2023 8:11 AM   Please see Amion for pager number to reach on-call Pulmonary and Critical Care Team.

## 2023-07-12 NOTE — Progress Notes (Signed)
 PROGRESS NOTE    Courtney Bennett  KGM:[id number] DOB: 17-Nov-1965 DOA: 07/06/2023 PCP: Patient, No Pcp Per  Chief Complaint  Patient presents with   Weakness    Brief Narrative:   58 yo with T1DM presenting with cough and shortness of breath.  She's been admitted with CAP.    Assessment & Plan:   Principal Problem:   Community acquired pneumonia of left upper lobe of lung Active Problems:   DM (diabetes mellitus) (HCC)  Sepsis due to Community Acquired Pneumonia Acute Hypoxic Respiratory Failure Met criteria for sepsis on admission with tachycardia, leukocytosis and CXR concerning for pneumonia Currently on simple mask Continue CAP coverage with ceftriaxone  (plan for at least 7 days), azithromycin  (completed 3 days)  CT chest with multifocal pneumonia, atypical in etiology Appreciate pulmonology assistance s/p bronch 2/7, follow results Cytology pending Cultures pending Pending pathology AFB cultures pending Follow blood cultures Negative covid, flu, RSV MRSA PCR negative, urine strep negative, urine legionella negative D/c airborne precautious  Post Bronch Pneumothorax Hydropneumothorax  Subcutaneous Emphysema S/p chest tube per pulm   Hypertension Started oral meds, notable HTN   Pleuritic Chest Pain Due to above  Anasarca Suspect at least partially related to hypoalbuminemia Echo with EF 60-65%, no RWMA, grade 1 diastolic dysfunction Will follow UA -> negative protein on UA  T1DM Currently on basal bolus (sounds like she's not taking appropriately at home) A1c 13.1 06/2023 Will follow and adjust as appropriate  Cachexia Could be from chronically poorly controlled diabetes, food insecurity (she suggests this as the reason) Needs additional evaluation/workup outpatient    DVT prophylaxis: lovenox  Code Status: full Family Communication: none Disposition:   Status is: Inpatient Remains inpatient appropriate because: need for continued inpatient  care   Consultants:  none  Procedures:  none  Antimicrobials:  Anti-infectives (From admission, onward)    Start     Dose/Rate Route Frequency Ordered Stop   07/07/23 1600  azithromycin  (ZITHROMAX ) 500 mg in sodium chloride  0.9 % 250 mL IVPB        500 mg 250 mL/hr over 60 Minutes Intravenous Every 24 hours 07/06/23 1748 07/08/23 1913   07/07/23 1600  cefTRIAXone  (ROCEPHIN ) 2 g in sodium chloride  0.9 % 100 mL IVPB        2 g 200 mL/hr over 30 Minutes Intravenous Every 24 hours 07/06/23 1748 07/14/23 1559   07/06/23 1600  cefTRIAXone  (ROCEPHIN ) 2 g in sodium chloride  0.9 % 100 mL IVPB        2 g 200 mL/hr over 30 Minutes Intravenous Once 07/06/23 1555 07/06/23 1655   07/06/23 1600  azithromycin  (ZITHROMAX ) 500 mg in sodium chloride  0.9 % 250 mL IVPB        500 mg 250 mL/hr over 60 Minutes Intravenous  Once 07/06/23 1555 07/06/23 1724       Subjective: No new complaints  Objective: Vitals:   07/12/23 0500 07/12/23 0600 07/12/23 0800 07/12/23 1200  BP: (!) 180/67 (!) 154/77    Pulse: 86 84    Resp: 13 14    Temp:   98.2 F (36.8 C) 98.3 F (36.8 C)  TempSrc:   Oral Oral  SpO2: 90% 90%    Weight:      Height:        Intake/Output Summary (Last 24 hours) at 07/12/2023 1843 Last data filed at 07/12/2023 1757 Gross per 24 hour  Intake 198.77 ml  Output 796 ml  Net -597.23 ml   Filed Weights   07/06/23  1519 07/06/23 1900 07/09/23 1541  Weight: 44 kg 39.3 kg 64.5 kg    Examination:  General: No acute distress. Cardiovascular: RRR Lungs: crackles worse to L base Abdomen: Soft, nontender, nondistended Neurological: Alert and oriented 3. Moves all extremities 4 with equal strength. Cranial nerves II through XII grossly intact. Extremities: No clubbing or cyanosis. No edema.   Data Reviewed: I have personally reviewed following labs and imaging studies  CBC: Recent Labs  Lab 07/06/23 1606 07/06/23 1916 07/07/23 0409 07/08/23 0417 07/10/23 0248  07/11/23 0256 07/12/23 0246  WBC 16.0*   < > 15.5* 11.4* 10.8* 11.1* 10.2  NEUTROABS 13.1*  --   --  8.8* 8.7*  --  7.7  HGB 17.2*   < > 14.7 14.1 13.3 13.8 13.4  HCT 53.8*   < > 47.2* 45.0 42.5 44.2 43.1  MCV 97.1   < > 98.7 98.5 97.9 99.3 99.5  PLT 269   < > 241 268 347 359 371   < > = values in this interval not displayed.    Basic Metabolic Panel: Recent Labs  Lab 07/07/23 0409 07/08/23 0417 07/10/23 0248 07/11/23 0256 07/12/23 0246  NA 136 130* 134* 136 136  K 3.7 3.5 4.1 4.5 4.6  CL 101 96* 97* 98 99  CO2 25 28 30 30 30   GLUCOSE 293* 259* 225* 310* 267*  BUN 9 14 13 17 19   CREATININE <0.30* 0.33* <0.30* 0.36* 0.41*  CALCIUM  8.4* 8.1* 8.0* 8.3* 8.2*  MG  --  1.8 1.7 1.8 1.8  PHOS  --  1.9* 3.4 2.9 2.8    GFR: Estimated Creatinine Clearance: 70.1 mL/min (Leetta Hendriks) (by C-G formula based on SCr of 0.41 mg/dL (L)).  Liver Function Tests: Recent Labs  Lab 07/06/23 1606 07/08/23 0417 07/10/23 0248 07/12/23 0246  AST 12* 10* 8* 13*  ALT 16 11 10 11   ALKPHOS 76 63 51 54  BILITOT 1.5* 0.5 0.4 0.5  PROT 7.4 5.2* 5.1* 5.3*  ALBUMIN 3.3* 2.1* 2.0* 2.1*    CBG: Recent Labs  Lab 07/11/23 1605 07/11/23 2156 07/12/23 0828 07/12/23 1143 07/12/23 1759  GLUCAP 397* 210* 220* 349* 210*     Recent Results (from the past 240 hours)  Blood Culture (routine x 2)     Status: None   Collection Time: 07/06/23  4:06 PM   Specimen: BLOOD RIGHT ARM  Result Value Ref Range Status   Specimen Description   Final    BLOOD RIGHT ARM Performed at [facility], [address], [place], [place] [address]    Special Requests   Final    BOTTLES DRAWN AEROBIC AND ANAEROBIC Blood Culture results may not be optimal due to an inadequate volume of blood received in culture bottles Performed at [facility], [address] [place], [place] [address]    Culture   Final    NO GROWTH 5 DAYS Performed at [facility], [address] [place], [place]  [address]    Report Status 07/11/2023 FINAL  Final  Blood Culture (routine x 2)     Status: None   Collection Time: 07/06/23  4:09 PM   Specimen: BLOOD LEFT ARM  Result Value Ref Range Status   Specimen Description   Final    BLOOD LEFT ARM Performed at [facility], [address], [place], [place] [address]    Special Requests   Final    BOTTLES DRAWN AEROBIC AND ANAEROBIC Blood Culture  results may not be optimal due to an inadequate volume of blood received in culture bottles Performed at [facility], [address] [place], [place] [address]    Culture   Final    NO GROWTH 5 DAYS Performed at [facility], [address], [place], [place] [address]    Report Status 07/11/2023 FINAL  Final  Resp panel by RT-PCR (RSV, Flu Tarvaris Puglia&B, Covid) Anterior Nasal Swab     Status: None   Collection Time: 07/06/23 10:52 PM   Specimen: Anterior Nasal Swab  Result Value Ref Range Status   SARS Coronavirus 2 by RT PCR NEGATIVE NEGATIVE Final    Comment: (NOTE) SARS-CoV-2 target nucleic acids are NOT DETECTED.  The SARS-CoV-2 RNA is generally detectable in upper respiratory specimens during the acute phase of infection. The lowest concentration of SARS-CoV-2 viral copies this assay can detect is 138 copies/mL. Cadon Raczka negative result does not preclude SARS-Cov-2 infection and should not be used as the sole basis for treatment or other patient management decisions. Pamla Pangle negative result may occur with  improper specimen collection/handling, submission of specimen other than nasopharyngeal swab, presence of viral mutation(s) within the areas targeted by this assay, and inadequate number of viral copies(<138 copies/mL). Bethany Cumming negative result must be combined with clinical observations, patient history, and epidemiological information. The expected result is Negative.  Fact Sheet for Patients:  BloggerCourse.com  Fact Sheet for Healthcare Providers:   SeriousBroker.it  This test is no t yet approved or cleared by the [place]  FDA and  has been authorized for detection and/or diagnosis of SARS-CoV-2 by FDA under an Emergency Use Authorization (EUA). This EUA will remain  in effect (meaning this test can be used) for the duration of the COVID-19 declaration under Section 564(b)(1) of the Act, 21 U.S.C.section 360bbb-3(b)(1), unless the authorization is terminated  or revoked sooner.       Influenza Tyreak Reagle by PCR NEGATIVE NEGATIVE Final   Influenza B by PCR NEGATIVE NEGATIVE Final    Comment: (NOTE) The Xpert Xpress SARS-CoV-2/FLU/RSV plus assay is intended as an aid in the diagnosis of influenza from Nasopharyngeal swab specimens and should not be used as Leor Whyte sole basis for treatment. Nasal washings and aspirates are unacceptable for Xpert Xpress SARS-CoV-2/FLU/RSV testing.  Fact Sheet for Patients: BloggerCourse.com  Fact Sheet for Healthcare Providers: SeriousBroker.it  This test is not yet approved or cleared by the [place]  FDA and has been authorized for detection and/or diagnosis of SARS-CoV-2 by FDA under an Emergency Use Authorization (EUA). This EUA will remain in effect (meaning this test can be used) for the duration of the COVID-19 declaration under Section 564(b)(1) of the Act, 21 U.S.C. section 360bbb-3(b)(1), unless the authorization is terminated or revoked.     Resp Syncytial Virus by PCR NEGATIVE NEGATIVE Final    Comment: (NOTE) Fact Sheet for Patients: BloggerCourse.com  Fact Sheet for Healthcare Providers: SeriousBroker.it  This test is not yet approved or cleared by the [place]  FDA and has been authorized for detection and/or diagnosis of SARS-CoV-2 by FDA under an Emergency Use Authorization (EUA). This EUA will remain in effect (meaning this test can be used) for  the duration of the COVID-19 declaration under Section 564(b)(1) of the Act, 21 U.S.C. section 360bbb-3(b)(1), unless the authorization is terminated or revoked.  Performed at [facility], [address], [place], [place] [address]   MRSA Next Gen by PCR, Nasal     Status: None  Collection Time: 07/07/23 11:25 AM  Result Value Ref Range Status   MRSA by PCR Next Gen NOT DETECTED NOT DETECTED Final    Comment: (NOTE) The GeneXpert MRSA Assay (FDA approved for NASAL specimens only), is one component of Ernesto Lashway comprehensive MRSA colonization surveillance program. It is not intended to diagnose MRSA infection nor to guide or monitor treatment for MRSA infections. Test performance is not FDA approved in patients less than 71 years old. Performed at [facility], [address], [place], [place] [address]   Respiratory (~20 pathogens) panel by PCR     Status: None   Collection Time: 07/08/23  7:37 AM   Specimen: Nasopharyngeal Swab; Respiratory  Result Value Ref Range Status   Adenovirus NOT DETECTED NOT DETECTED Final   Coronavirus 229E NOT DETECTED NOT DETECTED Final    Comment: (NOTE) The Coronavirus on the Respiratory Panel, DOES NOT test for the novel  Coronavirus (2019 nCoV)    Coronavirus HKU1 NOT DETECTED NOT DETECTED Final   Coronavirus NL63 NOT DETECTED NOT DETECTED Final   Coronavirus OC43 NOT DETECTED NOT DETECTED Final   Metapneumovirus NOT DETECTED NOT DETECTED Final   Rhinovirus / Enterovirus NOT DETECTED NOT DETECTED Final   Influenza Hendryx Ricke NOT DETECTED NOT DETECTED Final   Influenza B NOT DETECTED NOT DETECTED Final   Parainfluenza Virus 1 NOT DETECTED NOT DETECTED Final   Parainfluenza Virus 2 NOT DETECTED NOT DETECTED Final   Parainfluenza Virus 3 NOT DETECTED NOT DETECTED Final   Parainfluenza Virus 4 NOT DETECTED NOT DETECTED Final   Respiratory Syncytial Virus NOT DETECTED NOT DETECTED Final   Bordetella pertussis NOT DETECTED  NOT DETECTED Final   Bordetella Parapertussis NOT DETECTED NOT DETECTED Final   Chlamydophila pneumoniae NOT DETECTED NOT DETECTED Final   Mycoplasma pneumoniae NOT DETECTED NOT DETECTED Final    Comment: Performed at [facility], [address], [place], [place] [address]  Acid Fast Smear (AFB)     Status: None   Collection Time: 07/08/23  9:00 AM   Specimen: Sputum  Result Value Ref Range Status   AFB Specimen Processing Concentration  Final   Acid Fast Smear Negative  Final    Comment: (NOTE) Performed At: [facility] [address] [place], [place] [id number] Pearlean Botts MD VH:[id number]    Source (AFB) SPUTUM  Final    Comment: Performed at [facility], [address] [place], [place] [address]  Acid Fast Smear (AFB)     Status: None   Collection Time: 07/09/23  8:16 AM   Specimen: Sputum  Result Value Ref Range Status   AFB Specimen Processing Concentration  Final   Acid Fast Smear Negative  Final    Comment: (NOTE) Performed At: [facility] [address] [place], [place] [id number] Pearlean Botts MD [id number]    Source (AFB) SPUTUM  Final    Comment: Performed at [facility], [address], [place], [place] [address]  Culture, BAL-quantitative w Gram Stain     Status: None (Preliminary result)   Collection Time: 07/09/23  1:55 PM   Specimen: Bronchial Alveolar Lavage; Respiratory  Result Value Ref Range Status   Specimen Description   Final    BRONCHIAL ALVEOLAR LAVAGE SPECIMEN Merrit Friesen Performed at [facility], [address] [place], [place] [address]    Special Requests   Final    NONE Performed at [facility], [address], [place], [place] [address]  Gram Stain   Final    RARE WBC PRESENT, PREDOMINANTLY PMN NO ORGANISMS SEEN    Culture   Final    NO GROWTH 3 DAYS Performed at [facility], [address] [place], [place] [address]     Report Status PENDING  Incomplete  Acid Fast Smear (AFB)     Status: None   Collection Time: 07/09/23  1:55 PM   Specimen: Bronchial Alveolar Lavage; Respiratory  Result Value Ref Range Status   AFB Specimen Processing Concentration  Final   Acid Fast Smear Negative  Final    Comment: (NOTE) Performed At: [facility] [address] [place], [place] [id number] Pearlean Botts MD [id number]    Source (AFB) BRONCHIAL ALVEOLAR LAVAGE  Final    Comment: SPECIMEN Seniya Stoffers Performed at [facility], [address] [place], [place] [address]   Anaerobic culture w Gram Stain     Status: None (Preliminary result)   Collection Time: 07/09/23  1:55 PM   Specimen: Bronchoalveolar Lavage  Result Value Ref Range Status   Specimen Description BRONCHIAL ALVEOLAR LAVAGE  Final   Special Requests NONE  Final   Gram Stain   Final    RARE WBC PRESENT, PREDOMINANTLY PMN NO ORGANISMS SEEN    Culture   Final    NO GROWTH 2 DAYS Performed at [facility], [address] [place], [place] [address]    Report Status PENDING  Incomplete  Acid Fast Smear (AFB)     Status: None   Collection Time: 07/09/23  2:02 PM   Specimen: Bronchial Brushing, Left; Respiratory  Result Value Ref Range Status   AFB Specimen Processing Concentration  Final   Acid Fast Smear Negative  Final    Comment: (NOTE) Performed At: [facility] [address] [place], [place] [id number] Pearlean Botts MD [id number]:[id number]    Source (AFB) BRONCHIAL ALVEOLAR LAVAGE  Final    Comment: BRONCHIAL BRUSHING, LEFT SPECIMEN B Performed at [facility], [address] [place], [place] [address]   Acid Fast Smear (AFB)     Status: None   Collection Time: 07/09/23  2:04 PM   Specimen: Bronchial Brushing, Left; Respiratory  Result Value Ref Range Status   AFB Specimen Processing Concentration  Final   Acid Fast Smear Negative  Final    Comment: (NOTE) Performed At: [facility] [address] [place], [place] [id number] Pearlean Botts MD [id number]    Source (AFB) BRONCHIAL ALVEOLAR LAVAGE  Final    Comment: BRONCHIAL BRUSHING SPECIMEN C Performed at [facility], [address] [place], [place] [address]   Aerobic/Anaerobic Culture w Gram Stain (surgical/deep wound)     Status: None (Preliminary result)   Collection Time: 07/09/23  2:06 PM   Specimen: PATH Respiratory biopsy; Tissue  Result Value Ref Range Status   Specimen Description BIOPSY  Final   Special Requests LINGULA TRANSBRONCHIAL BIOSPIES SAMPLE D  Final   Gram Stain NO WBC SEEN NO ORGANISMS SEEN   Final   Culture   Final    NO GROWTH 3 DAYS NO ANAEROBES ISOLATED; CULTURE IN PROGRESS FOR 5 DAYS Performed at [facility], [address] [place], [place] [address]    Report Status PENDING  Incomplete  Acid Fast Smear (AFB)     Status: None   Collection Time: 07/09/23  2:06 PM   Specimen: PATH Respiratory biopsy; Tissue  Result Value Ref Range Status   AFB  Specimen Processing Concentration  Final   Acid Fast Smear Negative  Final    Comment: (NOTE) Performed At: [facility] [address] [place], [place] [id number] Pearlean Botts MD [id number]:[id number]    Source (AFB) BRONCHIAL ALVEOLAR LAVAGE  Final    Comment: RESPIRATORY BIOPSY LINGULA TRANSBRONCHIAL BIOPSIES SPECIMEN D Performed at [facility], [address] [place], [place] [address]          Radiology Studies: DG CHEST PORT 1 VIEW Result Date: 07/12/2023 CLINICAL DATA:  58 year old female with history of pneumothorax. EXAM: PORTABLE CHEST 1 VIEW COMPARISON:  Chest x-ray 07/11/2023. FINDINGS: Small bore left-sided chest tube with tip projecting over the lateral aspect of the lower left hemithorax. Persistent small left-sided pneumothorax with extensive gas in the subcutaneous soft tissues of the left chest wall tracking cephalad into the lower left cervical  region. Patchy ill-defined opacities in the lungs bilaterally, most evident in the lung bases. Small bilateral pleural effusions. No right pneumothorax. No evidence of pulmonary edema. Heart size is normal. The patient is rotated to the right on today's exam, resulting in distortion of the mediastinal contours and reduced diagnostic sensitivity and specificity for mediastinal pathology. Atherosclerotic calcifications are noted in the thoracic aorta. IMPRESSION: 1. Persistent small left-sided hydropneumothorax with extensive subcutaneous emphysema in the left chest wall tracking cephalad into the lower left cervical region. 2. Patchy ill-defined opacities in the lungs bilaterally, concerning for multifocal pneumonia, most confluent in the right base. 3. Small right pleural effusion. 4. Aortic atherosclerosis. Electronically Signed   By: Alexandria Angel M.D.   On: 07/12/2023 05:58   DG CHEST PORT 1 VIEW Result Date: 07/11/2023 CLINICAL DATA:  Chest tube in place. EXAM: PORTABLE CHEST 1 VIEW COMPARISON:  Chest radiograph dated 07/11/2023. FINDINGS: Left-sided chest tube in similar position. No detectable pneumothorax. Bibasilar densities improved since the prior radiograph. Stable cardiac silhouette. Atherosclerotic calcification of the aorta. No acute osseous pathology. Left chest wall soft tissue emphysema. IMPRESSION: 1. Left-sided chest tube in similar position. No detectable pneumothorax. 2. Improved bibasilar densities. Electronically Signed   By: Angus Bark M.D.   On: 07/11/2023 14:12   DG Chest 1 View Result Date: 07/11/2023 CLINICAL DATA:  Follow-up left pneumothorax. EXAM: CHEST  1 VIEW COMPARISON:  07/10/2023 FINDINGS: The heart size and mediastinal contours are within normal limits. Left pleural pigtail catheter remains in place. Subcutaneous emphysema again seen in the left chest wall, however, no pneumothorax identified. Mild bibasilar airspace opacity and small left pleural effusion show no  significant change. IMPRESSION: Left chest wall subcutaneous emphysema, without definite pneumothorax. Left pleural pigtail catheter remains in place. Stable mild bibasilar airspace opacity and small left pleural effusion. Electronically Signed   By: Marlyce Sine M.D.   On: 07/11/2023 09:54        Scheduled Meds:  acetaminophen   1,000 mg Oral Q8H   amLODipine   5 mg Oral Daily   Chlorhexidine  Gluconate Cloth  6 each Topical Daily   enoxaparin  (LOVENOX ) injection  40 mg Subcutaneous Q24H   feeding supplement  1 Container Oral TID BM   fluticasone   2 spray Each Nare Daily   insulin  aspart  0-5 Units Subcutaneous QHS   insulin  aspart  0-9 Units Subcutaneous TID WC   insulin  aspart  10 Units Subcutaneous TID WC   insulin  glargine-yfgn  16 Units Subcutaneous Daily   ipratropium-albuterol   3 mL Nebulization Q6H WA   lidocaine   1 patch Transdermal Daily   losartan   25 mg Oral  Daily   sodium chloride  flush  10 mL Intrapleural Q8H   sodium chloride  flush  3 mL Intravenous Q12H   Continuous Infusions:  cefTRIAXone  (ROCEPHIN )  IV Stopped (07/12/23 1645)     LOS: 6 days    Time spent: over 30 min    Donnetta Gains, MD [facility]   To contact the attending provider between 7A-7P or the covering provider during after hours 7P-7A, please log into the web site [email or web address] and access using universal [place] password for that web site. If you do not have the password, please call the hospital operator.  07/12/2023, 6:43 PM

## 2023-07-12 NOTE — Anesthesia Postprocedure Evaluation (Signed)
 Anesthesia Post Note  Patient: MEKHIA BROGAN  Procedure(s) Performed: VIDEO BRONCHOSCOPY WITH FLUORO BRONCHIAL WASHINGS BRONCHIAL BRUSHINGS BRONCHIAL BIOPSIES HEMOSTASIS CONTROL     Patient location during evaluation: Endoscopy Anesthesia Type: General Level of consciousness: awake and alert Pain management: pain level controlled Vital Signs Assessment: post-procedure vital signs reviewed and stable Respiratory status: spontaneous breathing, nonlabored ventilation, respiratory function stable and patient connected to nasal cannula oxygen Cardiovascular status: blood pressure returned to baseline and stable Postop Assessment: no apparent nausea or vomiting Anesthetic complications: no   No notable events documented.  Last Vitals:  Vitals:   07/12/23 0500 07/12/23 0600  BP: (!) 180/67 (!) 154/77  Pulse: 86 84  Resp: 13 14  Temp:    SpO2: 90% 90%    Last Pain:  Vitals:   07/12/23 0448  TempSrc:   PainSc: 6                  Madex Seals L Aseem Sessums

## 2023-07-13 ENCOUNTER — Inpatient Hospital Stay (HOSPITAL_COMMUNITY): Payer: 59

## 2023-07-13 DIAGNOSIS — J189 Pneumonia, unspecified organism: Secondary | ICD-10-CM | POA: Diagnosis not present

## 2023-07-13 DIAGNOSIS — J939 Pneumothorax, unspecified: Secondary | ICD-10-CM | POA: Diagnosis not present

## 2023-07-13 LAB — CULTURE, BAL-QUANTITATIVE W GRAM STAIN: Culture: NO GROWTH

## 2023-07-13 LAB — COMPREHENSIVE METABOLIC PANEL
ALT: 15 U/L (ref 0–44)
AST: 14 U/L — ABNORMAL LOW (ref 15–41)
Albumin: 2.3 g/dL — ABNORMAL LOW (ref 3.5–5.0)
Alkaline Phosphatase: 53 U/L (ref 38–126)
Anion gap: 6 (ref 5–15)
BUN: 17 mg/dL (ref 6–20)
CO2: 33 mmol/L — ABNORMAL HIGH (ref 22–32)
Calcium: 8.7 mg/dL — ABNORMAL LOW (ref 8.9–10.3)
Chloride: 97 mmol/L — ABNORMAL LOW (ref 98–111)
Creatinine, Ser: 0.38 mg/dL — ABNORMAL LOW (ref 0.44–1.00)
GFR, Estimated: >60 mL/min (ref >60)
Glucose, Bld: 227 mg/dL — ABNORMAL HIGH (ref 70–99)
Potassium: 4.1 mmol/L (ref 3.5–5.1)
Sodium: 136 mmol/L (ref 135–145)
Total Bilirubin: 0.3 mg/dL (ref 0.0–1.2)
Total Protein: 5.8 g/dL — ABNORMAL LOW (ref 6.5–8.1)

## 2023-07-13 LAB — CBC WITH DIFFERENTIAL/PLATELET
Abs Immature Granulocytes: 0.03 10*3/uL (ref 0.00–0.07)
Basophils Absolute: 0 10*3/uL (ref 0.0–0.1)
Basophils Relative: 0 %
Eosinophils Absolute: 0.1 10*3/uL (ref 0.0–0.5)
Eosinophils Relative: 2 %
HCT: 43.3 % (ref 36.0–46.0)
Hemoglobin: 13.3 g/dL (ref 12.0–15.0)
Immature Granulocytes: 0 %
Lymphocytes Relative: 21 %
Lymphs Abs: 1.6 10*3/uL (ref 0.7–4.0)
MCH: 30.2 pg (ref 26.0–34.0)
MCHC: 30.7 g/dL (ref 30.0–36.0)
MCV: 98.4 fL (ref 80.0–100.0)
Monocytes Absolute: 0.8 10*3/uL (ref 0.1–1.0)
Monocytes Relative: 11 %
Neutro Abs: 5 10*3/uL (ref 1.7–7.7)
Neutrophils Relative %: 66 %
Platelets: 387 10*3/uL (ref 150–400)
RBC: 4.4 MIL/uL (ref 3.87–5.11)
RDW: 12.8 % (ref 11.5–15.5)
WBC: 7.6 10*3/uL (ref 4.0–10.5)
nRBC: 0 % (ref 0.0–0.2)

## 2023-07-13 LAB — MAGNESIUM: Magnesium: 1.9 mg/dL (ref 1.7–2.4)

## 2023-07-13 LAB — GLUCOSE, CAPILLARY
Glucose-Capillary: 249 mg/dL — ABNORMAL HIGH (ref 70–99)
Glucose-Capillary: 170 mg/dL — ABNORMAL HIGH (ref 70–99)
Glucose-Capillary: 186 mg/dL — ABNORMAL HIGH (ref 70–99)
Glucose-Capillary: 222 mg/dL — ABNORMAL HIGH (ref 70–99)

## 2023-07-13 LAB — CYTOLOGY - NON PAP

## 2023-07-13 LAB — SURGICAL PATHOLOGY

## 2023-07-13 LAB — PHOSPHORUS: Phosphorus: 3.2 mg/dL (ref 2.5–4.6)

## 2023-07-13 MED ORDER — INSULIN GLARGINE-YFGN 100 UNIT/ML ~~LOC~~ SOLN
20.0000 [IU] | Freq: Every day | SUBCUTANEOUS | Status: DC
  Administered 2023-07-13 – 2023-07-21 (×9): 20 [IU] via SUBCUTANEOUS
  Filled 2023-07-13 (×9): qty 0.2

## 2023-07-13 MED ORDER — AMLODIPINE BESYLATE 5 MG PO TABS
10.0000 mg | ORAL_TABLET | Freq: Every day | ORAL | Status: DC
  Administered 2023-07-14 – 2023-07-21 (×8): 10 mg via ORAL
  Filled 2023-07-13 (×2): qty 1
  Filled 2023-07-13 (×6): qty 2

## 2023-07-13 MED ORDER — AMLODIPINE BESYLATE 5 MG PO TABS
5.0000 mg | ORAL_TABLET | Freq: Once | ORAL | Status: AC
  Administered 2023-07-13: 5 mg via ORAL
  Filled 2023-07-13: qty 1

## 2023-07-13 MED ORDER — DIPHENHYDRAMINE HCL 25 MG PO CAPS
25.0000 mg | ORAL_CAPSULE | Freq: Four times a day (QID) | ORAL | Status: DC | PRN
  Administered 2023-07-13: 25 mg via ORAL
  Filled 2023-07-13: qty 1

## 2023-07-13 NOTE — Progress Notes (Addendum)
eLink Physician-Brief Progress Note Patient Name: Courtney Bennett DOB: 1966/02/12 MRN: [id number]   Date of Service  07/13/2023  HPI/Events of Note  BP 177/114 Suspect secondary to pain however patient declines to take any additional pain meds  eICU Interventions  Hydralazine 10 mg PRN ordered   1:44 AM Reports itching after hydralazine. No hives but has red legs. Bendaryl PRN ordered   Intervention Category Intermediate Interventions: Hypertension - evaluation and management  Divit Stipp Mechele Collin 07/13/2023, 12:01 AM

## 2023-07-13 NOTE — Plan of Care (Signed)
Problem: Coping: Goal: Ability to adjust to condition or change in health will improve Outcome: Not Progressing   Problem: Nutritional: Goal: Maintenance of adequate nutrition will improve Outcome: Not Progressing   Problem: Education: Goal: Knowledge of General Education information will improve Description: Including pain rating scale, medication(s)/side effects and non-pharmacologic comfort measures Outcome: Not Progressing

## 2023-07-13 NOTE — Progress Notes (Signed)
PROGRESS NOTE    Courtney Bennett  WUJ:[id number] DOB: February 17, 1966 DOA: 07/06/2023 PCP: Patient, No Pcp Per  Chief Complaint  Patient presents with   Weakness    Brief Narrative:   58 yo with T1DM presenting with cough and shortness of breath.  She's been admitted with CAP.    CT concerning for atypical pneumonia.  Now s/p bronch.  Post bronch pneumothorax.  Pulm following.    See below for additional details.  Assessment & Plan:   Principal Problem:   Community acquired pneumonia of left upper lobe of lung Active Problems:   DM (diabetes mellitus) (HCC)  Sepsis due to Community Acquired Pneumonia Acute Hypoxic Respiratory Failure Met criteria for sepsis on admission with tachycardia, leukocytosis and CXR concerning for pneumonia Currently on simple mask (she doesn't tolerate nasal canula well, desatting to 80's on RA) Continue CAP coverage with ceftriaxone (plan for at least 7 days), azithromycin (completed 3 days)  CT chest with multifocal pneumonia, atypical in etiology Appreciate pulmonology assistance s/p bronch 2/7, follow results Cytology -> no malignant cells, benign bronchial cells and alveolar macrophages Cultures pending pathology -> benign lung tissue AFB cultures pending Follow blood cultures Negative covid, flu, RSV MRSA PCR negative, urine strep negative, urine legionella negative D/c airborne precautious  Post Bronch Pneumothorax Hydropneumothorax  Subcutaneous Emphysema S/p chest tube per pulm - removed 2/11  Hypertension Started oral meds, notable HTN   Pleuritic Chest Pain Due to above  Anasarca Suspect at least partially related to hypoalbuminemia Echo with EF 60-65%, no RWMA, grade 1 diastolic dysfunction Will follow UA -> negative protein on UA  T1DM Currently on basal bolus (sounds like she's not taking appropriately at home) A1c 13.1 06/2023 Will follow and adjust as appropriate  Cachexia Could be from chronically poorly controlled  diabetes, food insecurity (she suggests this as the reason) Needs additional evaluation/workup outpatient    DVT prophylaxis: lovenox Code Status: full Family Communication: none Disposition:   Status is: Inpatient Remains inpatient appropriate because: need for continued inpatient care   Consultants:  none  Procedures:  none  Antimicrobials:  Anti-infectives (From admission, onward)    Start     Dose/Rate Route Frequency Ordered Stop   07/07/23 1600  azithromycin (ZITHROMAX) 500 mg in sodium chloride 0.9 % 250 mL IVPB        500 mg 250 mL/hr over 60 Minutes Intravenous Every 24 hours 07/06/23 1748 07/08/23 1913   07/07/23 1600  cefTRIAXone (ROCEPHIN) 2 g in sodium chloride 0.9 % 100 mL IVPB        2 g 200 mL/hr over 30 Minutes Intravenous Every 24 hours 07/06/23 1748 07/13/23 1750   07/06/23 1600  cefTRIAXone (ROCEPHIN) 2 g in sodium chloride 0.9 % 100 mL IVPB        2 g 200 mL/hr over 30 Minutes Intravenous Once 07/06/23 1555 07/06/23 1655   07/06/23 1600  azithromycin (ZITHROMAX) 500 mg in sodium chloride 0.9 % 250 mL IVPB        500 mg 250 mL/hr over 60 Minutes Intravenous  Once 07/06/23 1555 07/06/23 1724       Subjective: Feels the same  Objective: Vitals:   07/13/23 1323 07/13/23 1400 07/13/23 1500 07/13/23 1600  BP:  (!) 186/68 (!) 163/105 (!) 142/69  Pulse: 89 (!) 101 95 87  Resp: 15 18 16 13   Temp:    98.5 F (36.9 C)  TempSrc:    Oral  SpO2: 98% 91% (!) 85% 98%  Weight:  Height:        Intake/Output Summary (Last 24 hours) at 07/13/2023 1849 Last data filed at 07/13/2023 1456 Gross per 24 hour  Intake 13 ml  Output 1300 ml  Net -1287 ml   Filed Weights   07/06/23 1519 07/06/23 1900 07/09/23 1541  Weight: 44 kg 39.3 kg 64.5 kg    Examination:  General: No acute distress. Cardiovascular: RRR Lungs: diminished, dressing at site of previous chest tube Neurological: Alert and oriented 3. Moves all extremities 4 with equal strength.  Cranial nerves II through XII grossly intact. Extremities: No clubbing or cyanosis. No edema. Data Reviewed: I have personally reviewed following labs and imaging studies  CBC: Recent Labs  Lab 07/08/23 0417 07/10/23 0248 07/11/23 0256 07/12/23 0246 07/13/23 0258  WBC 11.4* 10.8* 11.1* 10.2 7.6  NEUTROABS 8.8* 8.7*  --  7.7 5.0  HGB 14.1 13.3 13.8 13.4 13.3  HCT 45.0 42.5 44.2 43.1 43.3  MCV 98.5 97.9 99.3 99.5 98.4  PLT 268 347 359 371 387    Basic Metabolic Panel: Recent Labs  Lab 07/08/23 0417 07/10/23 0248 07/11/23 0256 07/12/23 0246 07/13/23 0258  NA 130* 134* 136 136 136  K 3.5 4.1 4.5 4.6 4.1  CL 96* 97* 98 99 97*  CO2 28 30 30 30  33*  GLUCOSE 259* 225* 310* 267* 227*  BUN 14 13 17 19 17   CREATININE 0.33* <0.30* 0.36* 0.41* 0.38*  CALCIUM 8.1* 8.0* 8.3* 8.2* 8.7*  MG 1.8 1.7 1.8 1.8 1.9  PHOS 1.9* 3.4 2.9 2.8 3.2    GFR: Estimated Creatinine Clearance: 70.1 mL/min (Courtney Bennett) (by C-G formula based on SCr of 0.38 mg/dL (L)).  Liver Function Tests: Recent Labs  Lab 07/08/23 0417 07/10/23 0248 07/12/23 0246 07/13/23 0258  AST 10* 8* 13* 14*  ALT 11 10 11 15   ALKPHOS 63 51 54 53  BILITOT 0.5 0.4 0.5 0.3  PROT 5.2* 5.1* 5.3* 5.8*  ALBUMIN 2.1* 2.0* 2.1* 2.3*    CBG: Recent Labs  Lab 07/12/23 1759 07/12/23 2202 07/13/23 0810 07/13/23 1211 07/13/23 1637  GLUCAP 210* 156* 249* 170* 186*     Recent Results (from the past 240 hours)  Blood Culture (routine x 2)     Status: None   Collection Time: 07/06/23  4:06 PM   Specimen: BLOOD RIGHT ARM  Result Value Ref Range Status   Specimen Description   Final    BLOOD RIGHT ARM Performed at [facility], [address], [place], [place] [address]    Special Requests   Final    BOTTLES DRAWN AEROBIC AND ANAEROBIC Blood Culture results may not be optimal due to an inadequate volume of blood received in culture bottles Performed at [facility], [address] [place], [place]  [address]    Culture   Final    NO GROWTH 5 DAYS Performed at [facility], [address], [place], [place] [address]    Report Status 07/11/2023 FINAL  Final  Blood Culture (routine x 2)     Status: None   Collection Time: 07/06/23  4:09 PM   Specimen: BLOOD LEFT ARM  Result Value Ref Range Status   Specimen Description   Final    BLOOD LEFT ARM Performed at [facility], [address], [place], [place] [address]    Special Requests   Final    BOTTLES DRAWN AEROBIC AND ANAEROBIC Blood Culture results may not be optimal due to an  inadequate volume of blood received in culture bottles Performed at [facility], [address], [place], [place] [address]    Culture   Final    NO GROWTH 5 DAYS Performed at [facility], [address], [place], [place] [address]    Report Status 07/11/2023 FINAL  Final  Resp panel by RT-PCR (RSV, Flu Leonarda Leis&B, Covid) Anterior Nasal Swab     Status: None   Collection Time: 07/06/23 10:52 PM   Specimen: Anterior Nasal Swab  Result Value Ref Range Status   SARS Coronavirus 2 by RT PCR NEGATIVE NEGATIVE Final    Comment: (NOTE) SARS-CoV-2 target nucleic acids are NOT DETECTED.  The SARS-CoV-2 RNA is generally detectable in upper respiratory specimens during the acute phase of infection. The lowest concentration of SARS-CoV-2 viral copies this assay can detect is 138 copies/mL. Jamy Whyte negative result does not preclude SARS-Cov-2 infection and should not be used as the sole basis for treatment or other patient management decisions. Isiah Scheel negative result may occur with  improper specimen collection/handling, submission of specimen other than nasopharyngeal swab, presence of viral mutation(s) within the areas targeted by this assay, and inadequate number of viral copies(<138 copies/mL). Luken Shadowens negative result must be combined with clinical observations, patient history, and epidemiological information. The expected result is  Negative.  Fact Sheet for Patients:  BloggerCourse.com  Fact Sheet for Healthcare Providers:  SeriousBroker.it  This test is no t yet approved or cleared by the [place] FDA and  has been authorized for detection and/or diagnosis of SARS-CoV-2 by FDA under an Emergency Use Authorization (EUA). This EUA will remain  in effect (meaning this test can be used) for the duration of the COVID-19 declaration under Section 564(b)(1) of the Act, 21 U.S.C.section 360bbb-3(b)(1), unless the authorization is terminated  or revoked sooner.       Influenza Mcihael Hinderman by PCR NEGATIVE NEGATIVE Final   Influenza B by PCR NEGATIVE NEGATIVE Final    Comment: (NOTE) The Xpert Xpress SARS-CoV-2/FLU/RSV plus assay is intended as an aid in the diagnosis of influenza from Nasopharyngeal swab specimens and should not be used as Britnay Magnussen sole basis for treatment. Nasal washings and aspirates are unacceptable for Xpert Xpress SARS-CoV-2/FLU/RSV testing.  Fact Sheet for Patients: BloggerCourse.com  Fact Sheet for Healthcare Providers: SeriousBroker.it  This test is not yet approved or cleared by the [place] FDA and has been authorized for detection and/or diagnosis of SARS-CoV-2 by FDA under an Emergency Use Authorization (EUA). This EUA will remain in effect (meaning this test can be used) for the duration of the COVID-19 declaration under Section 564(b)(1) of the Act, 21 U.S.C. section 360bbb-3(b)(1), unless the authorization is terminated or revoked.     Resp Syncytial Virus by PCR NEGATIVE NEGATIVE Final    Comment: (NOTE) Fact Sheet for Patients: BloggerCourse.com  Fact Sheet for Healthcare Providers: SeriousBroker.it  This test is not yet approved or cleared by the [place] FDA and has been authorized for detection and/or diagnosis of  SARS-CoV-2 by FDA under an Emergency Use Authorization (EUA). This EUA will remain in effect (meaning this test can be used) for the duration of the COVID-19 declaration under Section 564(b)(1) of the Act, 21 U.S.C. section 360bbb-3(b)(1), unless the authorization is terminated or revoked.  Performed at [facility] [address], [place], [place] [address]   MRSA Next Gen by PCR, Nasal     Status: None   Collection Time: 07/07/23 11:25 AM  Result Value  Ref Range Status   MRSA by PCR Next Gen NOT DETECTED NOT DETECTED Final    Comment: (NOTE) The GeneXpert MRSA Assay (FDA approved for NASAL specimens only), is one component of Hayleen Clinkscales comprehensive MRSA colonization surveillance program. It is not intended to diagnose MRSA infection nor to guide or monitor treatment for MRSA infections. Test performance is not FDA approved in patients less than 73 years old. Performed at [facility], [address], [place], [place] [address]   Respiratory (~20 pathogens) panel by PCR     Status: None   Collection Time: 07/08/23  7:37 AM   Specimen: Nasopharyngeal Swab; Respiratory  Result Value Ref Range Status   Adenovirus NOT DETECTED NOT DETECTED Final   Coronavirus 229E NOT DETECTED NOT DETECTED Final    Comment: (NOTE) The Coronavirus on the Respiratory Panel, DOES NOT test for the novel  Coronavirus (2019 nCoV)    Coronavirus HKU1 NOT DETECTED NOT DETECTED Final   Coronavirus NL63 NOT DETECTED NOT DETECTED Final   Coronavirus OC43 NOT DETECTED NOT DETECTED Final   Metapneumovirus NOT DETECTED NOT DETECTED Final   Rhinovirus / Enterovirus NOT DETECTED NOT DETECTED Final   Influenza November Sypher NOT DETECTED NOT DETECTED Final   Influenza B NOT DETECTED NOT DETECTED Final   Parainfluenza Virus 1 NOT DETECTED NOT DETECTED Final   Parainfluenza Virus 2 NOT DETECTED NOT DETECTED Final   Parainfluenza Virus 3 NOT DETECTED NOT DETECTED Final   Parainfluenza Virus 4  NOT DETECTED NOT DETECTED Final   Respiratory Syncytial Virus NOT DETECTED NOT DETECTED Final   Bordetella pertussis NOT DETECTED NOT DETECTED Final   Bordetella Parapertussis NOT DETECTED NOT DETECTED Final   Chlamydophila pneumoniae NOT DETECTED NOT DETECTED Final   Mycoplasma pneumoniae NOT DETECTED NOT DETECTED Final    Comment: Performed at [facility], [address] [place], [place] [address]  Acid Fast Smear (AFB)     Status: None   Collection Time: 07/08/23  9:00 AM   Specimen: Sputum  Result Value Ref Range Status   AFB Specimen Processing Concentration  Final   Acid Fast Smear Negative  Final    Comment: (NOTE) Performed At: [facility] [address] [place], [place] [id number] Jolene Schimke MD WG:[id number]    Source (AFB) SPUTUM  Final    Comment: Performed at [facility], [address] [place], [place] [address]  Acid Fast Smear (AFB)     Status: None   Collection Time: 07/09/23  8:16 AM   Specimen: Sputum  Result Value Ref Range Status   AFB Specimen Processing Concentration  Final   Acid Fast Smear Negative  Final    Comment: (NOTE) Performed At: [facility] [address] [place], [place] [id number] Jolene Schimke MD KG:[id number]    Source (AFB) SPUTUM  Final    Comment: Performed at [facility], [address] [place], [place] [address]  Culture, BAL-quantitative w Gram Stain     Status: None   Collection Time: 07/09/23  1:55 PM   Specimen: Bronchial Alveolar Lavage; Respiratory  Result Value Ref Range Status   Specimen Description   Final    BRONCHIAL ALVEOLAR LAVAGE SPECIMEN Lynnie Koehler Performed at [facility], [address], [place], [place] [address]    Special Requests   Final    NONE Performed at [facility], [address] [place], [place] [address]    Gram Stain   Final  RARE WBC PRESENT, PREDOMINANTLY PMN NO ORGANISMS SEEN    Culture    Final    NO GROWTH 3 DAYS Performed at [facility], [address] [place], [place] [address]    Report Status 07/13/2023 FINAL  Final  Acid Fast Smear (AFB)     Status: None   Collection Time: 07/09/23  1:55 PM   Specimen: Bronchial Alveolar Lavage; Respiratory  Result Value Ref Range Status   AFB Specimen Processing Concentration  Final   Acid Fast Smear Negative  Final    Comment: (NOTE) Performed At: [facility] [address] [place], [place] [id number] Jolene Schimke MD [id number]    Source (AFB) BRONCHIAL ALVEOLAR LAVAGE  Final    Comment: SPECIMEN Yanixan Mellinger Performed at [facility], [address], [place], [place] [address]   Anaerobic culture w Gram Stain     Status: None (Preliminary result)   Collection Time: 07/09/23  1:55 PM   Specimen: Bronchoalveolar Lavage  Result Value Ref Range Status   Specimen Description BRONCHIAL ALVEOLAR LAVAGE  Final   Special Requests NONE  Final   Gram Stain   Final    RARE WBC PRESENT, PREDOMINANTLY PMN NO ORGANISMS SEEN    Culture   Final    NO GROWTH 2 DAYS Performed at [facility], [address] [place], [place] [address]    Report Status PENDING  Incomplete  Acid Fast Smear (AFB)     Status: None   Collection Time: 07/09/23  2:02 PM   Specimen: Bronchial Brushing, Left; Respiratory  Result Value Ref Range Status   AFB Specimen Processing Concentration  Final   Acid Fast Smear Negative  Final    Comment: (NOTE) Performed At: [facility] [address] [place], [place] [id number] Jolene Schimke MD ZD:[id number]    Source (AFB) BRONCHIAL ALVEOLAR LAVAGE  Final    Comment: BRONCHIAL BRUSHING, LEFT SPECIMEN B Performed at [facility], [address], [place], [place] [address]   Acid Fast Smear (AFB)     Status: None   Collection Time: 07/09/23  2:04 PM   Specimen: Bronchial Brushing, Left; Respiratory  Result Value Ref Range Status   AFB Specimen  Processing Concentration  Final   Acid Fast Smear Negative  Final    Comment: (NOTE) Performed At: [facility] [address] [place], [place] [id number] Jolene Schimke MD [id number]    Source (AFB) BRONCHIAL ALVEOLAR LAVAGE  Final    Comment: BRONCHIAL BRUSHING SPECIMEN C Performed at [facility], [address] [place], [place] [address]   Aerobic/Anaerobic Culture w Gram Stain (surgical/deep wound)     Status: None (Preliminary result)   Collection Time: 07/09/23  2:06 PM   Specimen: PATH Respiratory biopsy; Tissue  Result Value Ref Range Status   Specimen Description BIOPSY  Final   Special Requests LINGULA TRANSBRONCHIAL BIOSPIES SAMPLE D  Final   Gram Stain NO WBC SEEN NO ORGANISMS SEEN   Final   Culture   Final    NO GROWTH 4 DAYS NO ANAEROBES ISOLATED; CULTURE IN PROGRESS FOR 5 DAYS Performed at [facility], [address], [place], [place] [address]    Report Status PENDING  Incomplete  Acid Fast Smear (AFB)     Status: None   Collection Time: 07/09/23  2:06 PM   Specimen: PATH Respiratory biopsy; Tissue  Result Value Ref Range Status   AFB Specimen Processing Concentration  Final  Acid Fast Smear Negative  Final    Comment: (NOTE) Performed At: [facility] [address] [place], [place] [id number] Jolene Schimke MD XB:[id number]    Source (AFB) BRONCHIAL ALVEOLAR LAVAGE  Final    Comment: RESPIRATORY BIOPSY LINGULA TRANSBRONCHIAL BIOPSIES SPECIMEN D Performed at [facility], [address] [place], [place] [address]          Radiology Studies: DG Chest 1 View Result Date: 07/13/2023 CLINICAL DATA:  Pneumothorax. EXAM: CHEST  1 VIEW COMPARISON:  Radiograph earlier today FINDINGS: Left basilar pigtail catheter slightly uncoiled in the interim. No visible pneumothorax. Subcutaneous emphysema in the left chest wall is similar. There is increasing patchy opacity at the right lung base.  There may be Jeffrey Graefe small right pleural effusion. Stable heart size and mediastinal contours. IMPRESSION: 1. Left basilar pigtail catheter slightly uncoiled in the interim. No visible pneumothorax. 2. Stable left chest wall subcutaneous emphysema. 3. Increasing patchy opacity at the right lung base. Possible small right pleural effusion. Electronically Signed   By: Narda Rutherford M.D.   On: 07/13/2023 18:14   DG Chest 1 View Result Date: 07/13/2023 CLINICAL DATA:  Follow-up pneumothorax EXAM: PORTABLE CHEST 1 VIEW COMPARISON:  07/12/2023 FINDINGS: Cardiac shadows within normal limits. Lungs are well aerated bilaterally. Pigtail catheter is again noted on the left. No pneumothorax is seen. Small pleural effusion remains. Minimal right-sided effusion is seen. Extensive subcutaneous emphysema is again noted. IMPRESSION: Resolution of previously seen left-sided pneumothorax. Small bilateral effusions. Electronically Signed   By: Alcide Clever M.D.   On: 07/13/2023 10:13   DG CHEST PORT 1 VIEW Result Date: 07/12/2023 CLINICAL DATA:  58 year old female with history of pneumothorax. EXAM: PORTABLE CHEST 1 VIEW COMPARISON:  Chest x-ray 07/11/2023. FINDINGS: Small bore left-sided chest tube with tip projecting over the lateral aspect of the lower left hemithorax. Persistent small left-sided pneumothorax with extensive gas in the subcutaneous soft tissues of the left chest wall tracking cephalad into the lower left cervical region. Patchy ill-defined opacities in the lungs bilaterally, most evident in the lung bases. Small bilateral pleural effusions. No right pneumothorax. No evidence of pulmonary edema. Heart size is normal. The patient is rotated to the right on today's exam, resulting in distortion of the mediastinal contours and reduced diagnostic sensitivity and specificity for mediastinal pathology. Atherosclerotic calcifications are noted in the thoracic aorta. IMPRESSION: 1. Persistent small left-sided  hydropneumothorax with extensive subcutaneous emphysema in the left chest wall tracking cephalad into the lower left cervical region. 2. Patchy ill-defined opacities in the lungs bilaterally, concerning for multifocal pneumonia, most confluent in the right base. 3. Small right pleural effusion. 4. Aortic atherosclerosis. Electronically Signed   By: Trudie Reed M.D.   On: 07/12/2023 05:58        Scheduled Meds:  amLODipine  5 mg Oral Daily   Chlorhexidine Gluconate Cloth  6 each Topical Daily   enoxaparin (LOVENOX) injection  40 mg Subcutaneous Q24H   feeding supplement  1 Container Oral TID BM   fluticasone  2 spray Each Nare Daily   insulin aspart  0-5 Units Subcutaneous QHS   insulin aspart  0-9 Units Subcutaneous TID WC   insulin aspart  10 Units Subcutaneous TID WC   insulin glargine-yfgn  20 Units Subcutaneous Daily   ipratropium-albuterol  3 mL Nebulization Q6H WA   lidocaine  1 patch Transdermal Daily   losartan  25 mg Oral Daily   sodium chloride flush  10 mL Intrapleural Q8H  sodium chloride flush  3 mL Intravenous Q12H   Continuous Infusions:     LOS: 7 days    Time spent: over 30 min    Lacretia Nicks, MD [facility]   To contact the attending provider between 7A-7P or the covering provider during after hours 7P-7A, please log into the web site [email or web address] and access using universal [place] password for that web site. If you do not have the password, please call the hospital operator.  07/13/2023, 6:49 PM

## 2023-07-13 NOTE — Plan of Care (Signed)
  Problem: Education: Goal: Ability to describe self-care measures that may prevent or decrease complications (Diabetes Survival Skills Education) will improve Outcome: Progressing   Problem: Coping: Goal: Ability to adjust to condition or change in health will improve Outcome: Not Progressing   Problem: Health Behavior/Discharge Planning: Goal: Ability to identify and utilize available resources and services will improve Outcome: Not Progressing

## 2023-07-13 NOTE — Progress Notes (Signed)
   NAME:  LAYTON TAPPAN, MRN:  [id number], DOB:  12-22-1965, LOS: 7 ADMISSION DATE:  07/06/2023, CONSULTATION DATE:  07/13/2023 REFERRING MD:  Zigmund Keysean Savino., *, CHIEF COMPLAINT:  pneumonia   History of Present Illness:  Courtney Bennett is a 58 year old unhoused woman with past medical history of COPD ongoing tobacco use disorder who presents with 2 weeks of worsening cough, shortness of breath, weakness, fatigue.  She is not bringing up significant sputum.  She notes that she has unintentionally lost weight who is 15 pounds over the last few months.  She has night sweats.  She denies hemoptysis.  Pertinent  Medical History  COPD Bipolar disorder, with paranoia and psychotic features Type 1 diabetes mellitus  Significant Hospital Events: Including procedures, antibiotic start and stop dates in addition to other pertinent events   2/7 Post bronchoscopy with transbronchial biopsies c/b small left pneumothorax. Transferred to ICU for respiratory distress 2/8 remained on suction with persistent air leak, no pneumothorax on chest x-ray  Interim History / Subjective:  Some pressure on left chest overnight. Breathing stable.  Objective   Blood pressure (!) 159/70, pulse 79, temperature (!) 97.5 F (36.4 C), temperature source Oral, resp. rate 13, height 5\' 3"  (1.6 m), weight 64.5 kg, last menstrual period 11/24/2014, SpO2 98%.        Intake/Output Summary (Last 24 hours) at 07/13/2023 0751 Last data filed at 07/13/2023 0047 Gross per 24 hour  Intake 211.77 ml  Output 775 ml  Net -563.23 ml   Filed Weights   07/06/23 1519 07/06/23 1900 07/09/23 1541  Weight: 44 kg 39.3 kg 64.5 kg   Physical Exam: No distress Stable chest wall crepitus Diminished breath sounds left I see no air leak  CXR stable subQ emphysema  Resolved Hospital Problem list     Assessment & Plan:   Severe sepsis secondary pneumonia Abnormal CT chest -concerning for atypical infection  COPD with ongoing  tobacco use disorder Constitutional symptoms including night sweats and weight loss - f/u bronch specimen data: nothing yet  Iatrogenic left pneumothorax  - clamp and recheck CXR at 1400  Associated pleurisy- multimodal pain strategy as ordered  PCCM will continue to follow for management of pneumothorax, chest tube  Critical care time: n/a     Lorin Glass, MD [facility] Pulmonary/Critical Care Medicine 07/13/2023 7:51 AM   Please see Amion for pager number to reach on-call Pulmonary and Critical Care Team.

## 2023-07-14 ENCOUNTER — Inpatient Hospital Stay (HOSPITAL_COMMUNITY): Payer: 59

## 2023-07-14 ENCOUNTER — Telehealth: Payer: Self-pay | Admitting: Internal Medicine

## 2023-07-14 DIAGNOSIS — J939 Pneumothorax, unspecified: Secondary | ICD-10-CM

## 2023-07-14 DIAGNOSIS — J9601 Acute respiratory failure with hypoxia: Secondary | ICD-10-CM

## 2023-07-14 DIAGNOSIS — R601 Generalized edema: Secondary | ICD-10-CM

## 2023-07-14 DIAGNOSIS — J189 Pneumonia, unspecified organism: Secondary | ICD-10-CM | POA: Diagnosis not present

## 2023-07-14 LAB — AEROBIC/ANAEROBIC CULTURE W GRAM STAIN (SURGICAL/DEEP WOUND)

## 2023-07-14 LAB — ANAEROBIC CULTURE W GRAM STAIN: Culture: NO GROWTH

## 2023-07-14 LAB — GLUCOSE, CAPILLARY
Glucose-Capillary: 129 mg/dL — ABNORMAL HIGH (ref 70–99)
Glucose-Capillary: 169 mg/dL — ABNORMAL HIGH (ref 70–99)
Glucose-Capillary: 172 mg/dL — ABNORMAL HIGH (ref 70–99)
Glucose-Capillary: 85 mg/dL (ref 70–99)
Glucose-Capillary: 413 mg/dL — ABNORMAL HIGH (ref 70–99)

## 2023-07-14 LAB — PROCALCITONIN: Procalcitonin: <0.1 ng/mL

## 2023-07-14 MED ORDER — FUROSEMIDE 10 MG/ML IJ SOLN
40.0000 mg | Freq: Once | INTRAMUSCULAR | Status: AC
Start: 2023-07-14 — End: 2023-07-14
  Administered 2023-07-14: 40 mg via INTRAVENOUS
  Filled 2023-07-14: qty 4

## 2023-07-14 MED ORDER — INSULIN ASPART 100 UNIT/ML IJ SOLN
6.0000 [IU] | Freq: Once | INTRAMUSCULAR | Status: AC
  Administered 2023-07-14: 6 [IU] via SUBCUTANEOUS

## 2023-07-14 MED ORDER — ENSURE ENLIVE PO LIQD
237.0000 mL | Freq: Three times a day (TID) | ORAL | Status: DC
  Administered 2023-07-15 – 2023-07-17 (×2): 237 mL via ORAL

## 2023-07-14 NOTE — Evaluation (Signed)
Occupational Therapy Evaluation Patient Details Name: Courtney Bennett MRN: [id number] DOB: 1965-11-04 Today's Date: 07/14/2023   History of Present Illness   Courtney Bennett is a 58 year old unhoused woman comes to ED 07/06/23 with  worsening SOB, weakness, fatigue,Community acquired pneumonia of left upper lobe of lung  . 2/7 Post bronchoscopy with transbronchial biopsies complicated by small left pneumothorax.Chest tube placed, removed 07/13/23. Transferred to ICU for respiratory distress. PMH: COPD ongoing tobacco use disorder who presents with 2 weeks of worsening cough, shortness of breath, weakness, fatigue.     Clinical Impressions Patient is a 58 year old female who was admitted for above. Patient is an un housed woman who reported being independent prior to this. Patients blood pressure was 119/43 mmhg sitting upright in bed. Patient noted to have O2 drop to 85% on RA with patient taking O2 off multiple times during session even with continued education. Patient was noted to have decreased functional activity tolerance, decreased endurance, decreased standing balance, decreased safety awareness, and decreased knowledge of AD/AE impacting participation in ADLs. Patient will benefit from continued inpatient follow up therapy, <3 hours/day.      If plan is discharge home, recommend the following:   A lot of help with bathing/dressing/bathroom;A lot of help with walking and/or transfers;Assistance with cooking/housework;Direct supervision/assist for medications management;Assist for transportation;Help with stairs or ramp for entrance;Direct supervision/assist for financial management     Functional Status Assessment   Patient has had a recent decline in their functional status and/or demonstrates limited ability to make significant improvements in function in a reasonable and predictable amount of time     Equipment Recommendations   None recommended by OT       Precautions/Restrictions   Precautions Precautions: Fall Precaution/Restrictions Comments: monitor Sats, placed on HFNC/[place] Restrictions Weight Bearing Restrictions Per Provider Order: No     Mobility Bed Mobility Overal bed mobility: Needs Assistance Bed Mobility: Supine to Sit     Supine to sit: Supervision                 Balance Overall balance assessment: Needs assistance Sitting-balance support: No upper extremity supported, Feet supported Sitting balance-Leahy Scale: Fair           ADL either performed or assessed with clinical judgement   ADL Overall ADL's : Needs assistance/impaired Eating/Feeding: Sitting;Supervision/ safety Eating/Feeding Details (indicate cue type and reason): taking sips from cup Grooming: Oral care;Sitting Grooming Details (indicate cue type and reason): EOB with increased time. Upper Body Bathing: Sitting;Set up   Lower Body Bathing: Sitting/lateral leans;Maximal assistance Lower Body Bathing Details (indicate cue type and reason): able to reach feet but not cooperative. Upper Body Dressing : Sitting;Set up   Lower Body Dressing: Maximal assistance;Sitting/lateral leans Lower Body Dressing Details (indicate cue type and reason): patient reaching for sock but putting no effort into fixing it.   Toilet Transfer Details (indicate cue type and reason): did not assess wiht patients difficulty with following commands during session and emotional lability. Toileting- Clothing Manipulation and Hygiene: Bed level;Moderate assistance               Vision   Additional Comments: no reports of issues. unable to formally assess with patients current level of emotional lability.            Pertinent Vitals/Pain Pain Assessment Pain Assessment: Faces Faces Pain Scale: Hurts a little bit Pain Location: left chest where chest tube was pulled Pain Descriptors / Indicators: Grimacing, Contraction Pain Intervention(s): Limited  activity within patient's tolerance     Extremity/Trunk Assessment Upper Extremity Assessment Upper Extremity Assessment: Overall WFL for tasks assessed   Lower Extremity Assessment Lower Extremity Assessment: Defer to PT evaluation   Cervical / Trunk Assessment Cervical / Trunk Assessment: Kyphotic;Other exceptions Cervical / Trunk Exceptions: cachectic   Communication Communication Communication: No apparent difficulties   Cognition Arousal: Alert Behavior During Therapy: Flat affect Cognition: Difficult to assess             OT - Cognition Comments: patient was not forthcoming with information during session. patient was tearful during session with quick transitions between emotions.                                    Home Living Family/patient expects to be discharged to:: Shelter/Homeless Living Arrangements: Alone             Additional Comments: Unsure, pt. did not answer      Prior Functioning/Environment Prior Level of Function : Independent/Modified Independent             Mobility Comments: Did have a cane although left it at [place] ADLs Comments: patient  did not provide information, emotionally labile  throught out eval.    OT Problem List: Impaired balance (sitting and/or standing);Decreased knowledge of precautions;Decreased knowledge of use of DME or AE;Decreased activity tolerance;Decreased cognition   OT Treatment/Interventions: Self-care/ADL training;DME and/or AE instruction;Balance training;Therapeutic activities;Patient/family education      OT Goals(Current goals can be found in the care plan section)   Acute Rehab OT Goals Patient Stated Goal: to feel better OT Goal Formulation: Patient unable to participate in goal setting Time For Goal Achievement: 07/28/23 Potential to Achieve Goals: Fair   OT Frequency:  Min 1X/week       AM-PAC OT "6 Clicks" Daily Activity     Outcome Measure Help from another person  eating meals?: A Little Help from another person taking care of personal grooming?: A Little Help from another person toileting, which includes using toliet, bedpan, or urinal?: A Lot Help from another person bathing (including washing, rinsing, drying)?: A Lot Help from another person to put on and taking off regular upper body clothing?: A Lot Help from another person to put on and taking off regular lower body clothing?: A Lot 6 Click Score: 14   End of Session Nurse Communication: Other (comment) (patients tearfulness and chaplin page)  Activity Tolerance: Patient tolerated treatment well Patient left: in bed;with call bell/phone within reach;with bed alarm set  OT Visit Diagnosis: Unsteadiness on feet (R26.81);Other abnormalities of gait and mobility (R26.89)                Time: 9562-1308 OT Time Calculation (min): 16 min Charges:  OT General Charges $OT Visit: 1 Visit OT Evaluation $OT Eval Moderate Complexity: 1 Mod  Travers Goodley OTR/L, MS Acute Rehabilitation Department Office# [phone]   Selinda Flavin 07/14/2023, 3:11 PM

## 2023-07-14 NOTE — Assessment & Plan Note (Signed)
Suspect at least partially related to hypoalbuminemia Echo with EF 60-65%, no RWMA, grade 1 diastolic dysfunction Will follow UA -> negative protein on UA

## 2023-07-14 NOTE — Assessment & Plan Note (Signed)
S/p chest tube per pulm - removed 2/11

## 2023-07-14 NOTE — Progress Notes (Signed)
07/14/2023 Currently resting Lung still up on CXR Would give some diuresis as tolerated by renal function PT/OT eval Will arrange OP f/u in 4-6 weeks for repeat imaging to assure resolution of atypical infiltrates.  Myrla Halsted MD PCCM

## 2023-07-14 NOTE — Evaluation (Addendum)
Physical Therapy Evaluation Patient Details Name: Courtney Bennett MRN: [id number] DOB: [birth year] Today's Date: 07/14/2023  History of Present Illness  Courtney Bennett is a 58 year old unhoused woman comes to ED 07/06/23 with  worsening SOB, weakness, fatigue,Community acquired pneumonia of left upper lobe of lung  . 2/7 Post bronchoscopy with transbronchial biopsies complicated by small left pneumothorax.Chest tube placed, removed 07/13/23. Transferred to ICU for respiratory distress. PMH: COPD ongoing tobacco use disorder who presents with 2 weeks of worsening cough, shortness of breath, weakness, fatigue.  Clinical Impression  Pt admitted with above diagnosis.  Pt currently with functional limitations due to the deficits listed below (see PT Problem List). Pt will benefit from acute skilled PT to increase their independence and safety with mobility to allow discharge.    The patient is  labile, stating that she is weak and feeling  bad. Patient required extra time to complete mobility task, taking off  [place], taking off socks after PT placed them. Patient has multiple abrasions on back and top of head , and legs.  Patient did not provide  any information about  her recent living arrangements. Notes indicate being unhoused. SPO2 on RA(pt. Removed [place])-85%. RN  changed to salter at 6 LPM, SpO2 96%.          If plan is discharge home, recommend the following: A little help with walking and/or transfers;A little help with bathing/dressing/bathroom;Assistance with cooking/housework;Assist for transportation;Help with stairs or ramp for entrance   Can travel by private vehicle   No    Equipment Recommendations None recommended by PT  Recommendations for Other Services       Functional Status Assessment Patient has had a recent decline in their functional status and demonstrates the ability to make significant improvements in function in a reasonable and predictable amount of time.     Precautions /  Restrictions Precautions Precautions: Fall Precaution/Restrictions Comments: monitor Sats, placed on HFNC/[place] Restrictions Weight Bearing Restrictions Per Provider Order: No      Mobility  Bed Mobility Overal bed mobility: Needs Assistance Bed Mobility: Supine to Sit     Supine to sit: Supervision     General bed mobility comments: extra time, ,patient taking off socks, pulling on  toes, required cues to replace the socks. Able to move  to sitting onto bed edge    Transfers Overall transfer level: Needs assistance Equipment used: 1 person hand held assist Transfers: Sit to/from Stand, Bed to chair/wheelchair/BSC Sit to Stand: Min assist   Step pivot transfers: Contact guard assist       General transfer comment: patient  finally  stood  and stepped to recliner, no support needed    Ambulation/Gait                  Stairs            Wheelchair Mobility     Tilt Bed    Modified Rankin (Stroke Patients Only)       Balance Overall balance assessment: Needs assistance Sitting-balance support: No upper extremity supported, Feet supported Sitting balance-Leahy Scale: Fair     Standing balance support: No upper extremity supported, During functional activity Standing balance-Leahy Scale: Fair                               Pertinent Vitals/Pain Pain Assessment Pain Assessment: Faces Pain Location: left chest Pain Descriptors / Indicators: Grimacing, Guarding, Crying Pain Intervention(s): Monitored during  session    Home Living Family/patient expects to be discharged to:: Shelter/Homeless Living Arrangements: Alone                 Additional Comments: Unsure, pt. did not answer    Prior Function Prior Level of Function : Independent/Modified Independent               ADLs Comments: patient  did not provide information, emotionally labile  throught out eval.     Extremity/Trunk Assessment   Upper Extremity  Assessment Upper Extremity Assessment: Overall WFL for tasks assessed    Lower Extremity Assessment Lower Extremity Assessment: Generalized weakness    Cervical / Trunk Assessment Cervical / Trunk Assessment: Kyphotic;Other exceptions Cervical / Trunk Exceptions: cachectic  Communication   Communication Communication: No apparent difficulties    Cognition Arousal: Alert Behavior During Therapy: Flat affect   PT - Cognitive impairments: Difficult to assess                       PT - Cognition Comments: frequent cues to replace [place], patient did not answer  orientation , labile through out, states she feels terrible. Following commands: Intact       Cueing       General Comments      Exercises     Assessment/Plan    PT Assessment Patient needs continued PT services  PT Problem List Decreased strength;Decreased balance;Decreased cognition;Decreased knowledge of precautions;Pain;Decreased mobility;Cardiopulmonary status limiting activity;Decreased activity tolerance;Decreased safety awareness;Decreased skin integrity       PT Treatment Interventions DME instruction;Therapeutic activities;Gait training;Therapeutic exercise;Patient/family education;Functional mobility training    PT Goals (Current goals can be found in the Care Plan section)  Acute Rehab PT Goals Patient Stated Goal: did not stated PT Goal Formulation: With patient Time For Goal Achievement: 07/28/23 Potential to Achieve Goals: Fair    Frequency Min 1X/week     Co-evaluation               AM-PAC PT "6 Clicks" Mobility  Outcome Measure Help needed turning from your back to your side while in a flat bed without using bedrails?: None Help needed moving from lying on your back to sitting on the side of a flat bed without using bedrails?: None Help needed moving to and from a bed to a chair (including a wheelchair)?: A Little Help needed standing up from a chair using your arms (e.g.,  wheelchair or bedside chair)?: A Little Help needed to walk in hospital room?: Total Help needed climbing 3-5 steps with a railing? : Total 6 Click Score: 16    End of Session   Activity Tolerance: Patient limited by fatigue Patient left: in chair;with chair alarm set Nurse Communication: Mobility status PT Visit Diagnosis: Unsteadiness on feet (R26.81);Muscle weakness (generalized) (M62.81);Adult, failure to thrive (R62.7);Difficulty in walking, not elsewhere classified (R26.2)    Time: 4098-1191 PT Time Calculation (min) (ACUTE ONLY): 38 min   Charges:   PT Evaluation $PT Eval Low Complexity: 1 Low PT Treatments $Therapeutic Activity: 8-22 mins $Self Care/Home Management: 8-22 PT General Charges $$ ACUTE PT VISIT: 1 Visit         Blanchard Kelch PT Acute Rehabilitation Services Office [phone] Weekend pager-[phone]   Rada Hay 07/14/2023, 12:20 PM

## 2023-07-14 NOTE — Progress Notes (Signed)
eLink Physician-Brief Progress Note Patient Name: Courtney Bennett DOB: May 24, 1966 MRN: [id number]   Date of Service  07/14/2023  HPI/Events of Note  Patient agitated and upset but AAOx4 Tried to get out of bed RN reports intermittently taking off oxygen with desaturations  eICU Interventions  Posey belt orders placed for safety BSRN counseled on oxygen compliance     Intervention Category Minor Interventions: Agitation / anxiety - evaluation and management  Kierrah Kilbride Mechele Collin 07/14/2023, 3:35 AM

## 2023-07-14 NOTE — Hospital Course (Signed)
Courtney Bennett is a 58 year old female with PMH COPD, fibromyalgia, diastolic CHF, HTN, DM I who presented with cough and shortness of breath.  Workup was consistent with pneumonia and complicated with developing a pneumothorax.  She was evaluated by pulmonology and required chest tube placement.  She also underwent bronchoscopy on 07/09/2023.  Chest tube was removed on 07/13/2023.

## 2023-07-14 NOTE — Telephone Encounter (Signed)
4-6 hospital f/u with any for atypical pneumonia

## 2023-07-14 NOTE — Progress Notes (Signed)
Patient tried to give me several dollar bills, in a request for me to go to a vending machine and buy her Reese peanut butter cups. Pt offered to pay me in cash. I let the patient know that I would not buy her this candy and I also informed her nurse of her request. Anytime I entered the room, patient was very persistent in begging for me to get her candy from the vending machine. I informed patient that her glucose was 415 and candy was not a good idea at this time. She began hitting the bed rails in anger and was very upset at not getting candy.

## 2023-07-14 NOTE — Progress Notes (Signed)
Progress Note    Courtney Bennett   WUJ:[id number]  DOB: [birth year]  DOA: 07/06/2023     8 PCP: Patient, No Pcp Per  Initial CC: cough, SOB  Hospital Course: Courtney Bennett is a 58 year old female with PMH COPD, fibromyalgia, diastolic CHF, HTN, DM I who presented with cough and shortness of breath.  Workup was consistent with pneumonia and complicated with developing a pneumothorax.  She was evaluated by pulmonology and required chest tube placement.  She also underwent bronchoscopy on 07/09/2023.  Chest tube was removed on 07/13/2023.  Interval History:  No events overnight.  Sitting in chair this morning.  She was anxious for starting to mobilize more.  She also asked for diabetic diet to be liberated to regular diet.  Assessment and Plan: * Community acquired pneumonia of left upper lobe of lung Met criteria for sepsis on admission with tachycardia, leukocytosis and CXR concerning for pneumonia Currently on simple mask (she doesn't tolerate nasal canula well, desatting to 80's on RA) Completed Rocephin and azithromycin courses CT chest with multifocal pneumonia, atypical in etiology Appreciate pulmonology assistance s/p bronch 2/7 Cytology -> no malignant cells, benign bronchial cells and alveolar macrophages Cultures pending pathology -> benign lung tissue AFB cultures NGTD; d/c airborne precautions Follow blood cultures Negative covid, flu, RSV MRSA PCR negative, urine strep negative, urine legionella negative -Outpatient follow-up with pulmonology in 4 to 6 weeks  Pneumothorax S/p chest tube per pulm - removed 2/11   Anasarca Suspect at least partially related to hypoalbuminemia Echo with EF 60-65%, no RWMA, grade 1 diastolic dysfunction Will follow UA -> negative protein on UA  DM (diabetes mellitus) (HCC) - continue SSI and CBG  - A1c 13.1% - will allow reg diet but if glucose elevates excessively will modify again   Old records reviewed in assessment of this  patient  Antimicrobials: Azithromycin 07/06/2023 >> 07/08/2023 Rocephin 07/06/2023 >> 07/13/2023  DVT prophylaxis:  enoxaparin (LOVENOX) injection 40 mg Start: 07/11/23 0800 SCDs Start: 07/06/23 1750   Code Status:   Code Status: Full Code  Mobility Assessment (Last 72 Hours)     Mobility Assessment     Row Name 07/14/23 1217 07/14/23 0800 07/11/23 1900       Does patient have an order for bedrest or is patient medically unstable -- No - Continue assessment No - Continue assessment     What is the highest level of mobility based on the progressive mobility assessment? Level 5 (Walks with assist in room/hall) - Balance while stepping forward/back and can walk in room with assist - Complete Level 5 (Walks with assist in room/hall) - Balance while stepping forward/back and can walk in room with assist - Complete Level 4 (Walks with assist in room) - Balance while marching in place and cannot step forward and back - Complete     Is the above level different from baseline mobility prior to current illness? -- Yes - Recommend PT order Yes - Recommend PT order              Barriers to discharge: none Disposition Plan:  SNF Status is: Inpt  Objective: Blood pressure (!) 133/51, pulse 80, temperature 97.8 F (36.6 C), temperature source Oral, resp. rate (!) 21, height 5\' 3"  (1.6 m), weight 64.5 kg, last menstrual period 11/24/2014, SpO2 95%.  Examination:  Physical Exam Constitutional:      Appearance: Normal appearance.  HENT:     Head: Normocephalic and atraumatic.     Mouth/Throat:  Mouth: Mucous membranes are moist.  Eyes:     Extraocular Movements: Extraocular movements intact.  Cardiovascular:     Rate and Rhythm: Normal rate and regular rhythm.  Pulmonary:     Effort: Pulmonary effort is normal. No respiratory distress.     Breath sounds: Normal breath sounds. No wheezing.  Abdominal:     General: Bowel sounds are normal. There is no distension.     Palpations: Abdomen  is soft.     Tenderness: There is no abdominal tenderness.  Musculoskeletal:        General: Normal range of motion.     Cervical back: Normal range of motion and neck supple.  Skin:    General: Skin is warm and dry.  Neurological:     General: No focal deficit present.     Mental Status: She is alert.  Psychiatric:        Mood and Affect: Mood normal.      Consultants:  Pulm  Procedures:    Data Reviewed: Results for orders placed or performed during the hospital encounter of 07/06/23 (from the past 24 hours)  Glucose, capillary     Status: Abnormal   Collection Time: 07/13/23  4:37 PM  Result Value Ref Range   Glucose-Capillary 186 (H) 70 - 99 mg/dL   Comment 1 Notify RN    Comment 2 Document in Chart   Glucose, capillary     Status: Abnormal   Collection Time: 07/13/23  9:56 PM  Result Value Ref Range   Glucose-Capillary 222 (H) 70 - 99 mg/dL  Glucose, capillary     Status: Abnormal   Collection Time: 07/14/23  8:17 AM  Result Value Ref Range   Glucose-Capillary 129 (H) 70 - 99 mg/dL  Glucose, capillary     Status: Abnormal   Collection Time: 07/14/23 10:07 AM  Result Value Ref Range   Glucose-Capillary 169 (H) 70 - 99 mg/dL  Procalcitonin     Status: None   Collection Time: 07/14/23 10:26 AM  Result Value Ref Range   Procalcitonin <0.10 ng/mL  Glucose, capillary     Status: Abnormal   Collection Time: 07/14/23 12:13 PM  Result Value Ref Range   Glucose-Capillary 172 (H) 70 - 99 mg/dL    I have reviewed pertinent nursing notes, vitals, labs, and images as necessary. I have ordered labwork to follow up on as indicated.  I have reviewed the last notes from staff over past 24 hours. I have discussed patient's care plan and test results with nursing staff, CM/SW, and other staff as appropriate.  Time spent: Greater than 50% of the 55 minute visit was spent in counseling/coordination of care for the patient as laid out in the A&P.   LOS: 8 days   Lewie Chamber, MD [facility] 07/14/2023, 12:39 PM

## 2023-07-14 NOTE — Progress Notes (Signed)
Pt BS 413, El[place] notified. Pt offered money to staff to get her some candy. Nursing staff refused and reoriented the patient to the services of the unit. Nurse also educated on goals of care and dietary boundaries considering her diagnosis. Waiting for hyperglycemic orders.

## 2023-07-14 NOTE — Progress Notes (Signed)
   07/14/23 1510  Spiritual Encounters  Type of Visit Initial  Care provided to: Patient  Conversation partners present during encounter Other (comment)  Reason for visit Urgent spiritual support  OnCall Visit No   Visited patient per request. Patient in a spiritual crisis. Provided spiritual care and prayer. Patient has a lot going on in her life that is also affecting her health and ability to provide for herself. Patient also in need assistance finding suitable housing. Family relationships have been severed and patient reports no visitor other than clergy members. Patient reviewed her life choices and story. Reflecting listening and possible goal planning.

## 2023-07-15 DIAGNOSIS — J9601 Acute respiratory failure with hypoxia: Secondary | ICD-10-CM | POA: Diagnosis not present

## 2023-07-15 DIAGNOSIS — J189 Pneumonia, unspecified organism: Secondary | ICD-10-CM | POA: Diagnosis not present

## 2023-07-15 DIAGNOSIS — J939 Pneumothorax, unspecified: Secondary | ICD-10-CM | POA: Diagnosis not present

## 2023-07-15 LAB — GLUCOSE, CAPILLARY
Glucose-Capillary: 205 mg/dL — ABNORMAL HIGH (ref 70–99)
Glucose-Capillary: 263 mg/dL — ABNORMAL HIGH (ref 70–99)
Glucose-Capillary: 100 mg/dL — ABNORMAL HIGH (ref 70–99)
Glucose-Capillary: 280 mg/dL — ABNORMAL HIGH (ref 70–99)

## 2023-07-15 MED ORDER — IPRATROPIUM-ALBUTEROL 0.5-2.5 (3) MG/3ML IN SOLN
3.0000 mL | Freq: Two times a day (BID) | RESPIRATORY_TRACT | Status: DC
  Administered 2023-07-15 – 2023-07-20 (×10): 3 mL via RESPIRATORY_TRACT
  Filled 2023-07-15 (×12): qty 3

## 2023-07-15 NOTE — Plan of Care (Signed)
  Problem: Fluid Volume: Goal: Ability to maintain a balanced intake and output will improve Outcome: Progressing   Problem: Skin Integrity: Goal: Risk for impaired skin integrity will decrease Outcome: Progressing   Problem: Tissue Perfusion: Goal: Adequacy of tissue perfusion will improve Outcome: Progressing   Problem: Clinical Measurements: Goal: Diagnostic test results will improve Outcome: Progressing   Problem: Pain Managment: Goal: General experience of comfort will improve and/or be controlled Outcome: Progressing   Problem: Safety: Goal: Ability to remain free from injury will improve Outcome: Progressing

## 2023-07-15 NOTE — Inpatient Diabetes Management (Signed)
Inpatient Diabetes Program Recommendations  AACE/ADA: New Consensus Statement on Inpatient Glycemic Control   Target Ranges:  Prepandial:   less than 140 mg/dL      Peak postprandial:   less than 180 mg/dL (1-2 hours)      Critically ill patients:  140 - 180 mg/dL    Latest Reference Range & Units 07/14/23 08:17 07/14/23 09:53 07/14/23 10:07 07/14/23 12:13 07/14/23 16:40 07/14/23 21:07 07/14/23 23:48 07/15/23 07:52  Glucose-Capillary 70 - 99 mg/dL 409 (H)     Semglee 20  units 169 (H) 172 (H) 85 413 (H)   Novolog 6 units 205 (H)   Review of Glycemic Control  Diabetes history: DM1 Outpatient Diabetes medications: Tresiba 22 units daily, Humalog 8 units TID with meals plus correction Current orders for Inpatient glycemic control: Semglee 20  units daily, Novolog 10 units TID with  meals, Novolog 0-9 units TID with meals, Novolog 0-5 units QHS  Inpatient Diabetes Program Recommendations:    Insulin: Per chart, patient refused Novolog correction and meal coverage scheduled at 8:00, 12:00, and 17:00 on 07/14/23. Glucose up to 413 mg/dl at 81:19 on 1/47/82 and fasting CBG 205 mg/dl today.   NOTE: In reviewing glucose trends noted patient did not receive any Novolog for correction or meal coverage for 8:00, 12:00, and 17:00 on 07/14/23.  Glucose up to 413 mg/dl at 95:62 and patient received Novolog 6 units at 23:48 on 07/14/23.  Per RN notes last night, patient was trying to get nursing staff to take money and go buy her candy from vending machine.   Thanks, Orlando Penner, RN, MSN, CDCES Diabetes Coordinator Inpatient Diabetes Program [phone] (Team Pager from 8am to 5pm)

## 2023-07-15 NOTE — Assessment & Plan Note (Signed)
-   continue weaning O2 as able - remains on salter HF this am

## 2023-07-15 NOTE — Progress Notes (Signed)
Progress Note    Courtney Bennett   [id number]  DOB: 01/28/1966  DOA: 07/06/2023     9 PCP: Patient, No Pcp Per  Initial CC: cough, SOB  Hospital Course: Courtney Bennett is a 58 year old female with PMH COPD, fibromyalgia, diastolic CHF, HTN, DM I who presented with cough and shortness of breath.  Workup was consistent with pneumonia and complicated with developing a pneumothorax.  She was evaluated by pulmonology and required chest tube placement.  She also underwent bronchoscopy on 07/09/2023.  Chest tube was removed on 07/13/2023.  Interval History:  No events overnight. Declined some insulin yesterday it seems and CBG became elevated. She says she plans to accept insulin today as needed.    Assessment and Plan: * Community acquired pneumonia of left upper lobe of lung Met criteria for sepsis on admission with tachycardia, leukocytosis and CXR concerning for pneumonia Currently on simple mask (she doesn't tolerate nasal canula well, desatting to 80's on RA) Completed Rocephin and azithromycin courses CT chest with multifocal pneumonia, atypical in etiology Appreciate pulmonology assistance s/p bronch 2/7 Cytology -> no malignant cells, benign bronchial cells and alveolar macrophages Cultures pending pathology -> benign lung tissue AFB cultures NGTD; d/c airborne precautions Follow blood cultures Negative covid, flu, RSV MRSA PCR negative, urine strep negative, urine legionella negative -Outpatient follow-up with pulmonology in 4 to 6 weeks  Pneumothorax S/p chest tube per pulm - removed 2/11   Acute respiratory failure with hypoxia (HCC) - continue weaning O2 as able - remains on salter HF this am  Anasarca Suspect at least partially related to hypoalbuminemia Echo with EF 60-65%, no RWMA, grade 1 diastolic dysfunction Will follow UA -> negative protein on UA  DM (diabetes mellitus) (HCC) - continue SSI and CBG  - A1c 13.1% - will allow reg diet but if glucose elevates  excessively will modify again   Old records reviewed in assessment of this patient  Antimicrobials: Azithromycin 07/06/2023 >> 07/08/2023 Rocephin 07/06/2023 >> 07/13/2023  DVT prophylaxis:  enoxaparin (LOVENOX) injection 40 mg Start: 07/11/23 0800 SCDs Start: 07/06/23 1750   Code Status:   Code Status: Full Code  Mobility Assessment (Last 72 Hours)     Mobility Assessment     Row Name 07/15/23 0710 07/14/23 1509 07/14/23 1217 07/14/23 0800     Does patient have an order for bedrest or is patient medically unstable No - Continue assessment -- -- No - Continue assessment    What is the highest level of mobility based on the progressive mobility assessment? Level 5 (Walks with assist in room/hall) - Balance while stepping forward/back and can walk in room with assist - Complete Level 2 (Chairfast) - Balance while sitting on edge of bed and cannot stand Level 5 (Walks with assist in room/hall) - Balance while stepping forward/back and can walk in room with assist - Complete Level 5 (Walks with assist in room/hall) - Balance while stepping forward/back and can walk in room with assist - Complete    Is the above level different from baseline mobility prior to current illness? Yes - Recommend PT order -- -- Yes - Recommend PT order             Barriers to discharge: none Disposition Plan:  SNF Status is: Inpt  Objective: Blood pressure (!) 128/59, pulse 96, temperature 98.2 F (36.8 C), temperature source Axillary, resp. rate 17, height 5\' 3"  (1.6 m), weight 64.5 kg, last menstrual period 11/24/2014, SpO2 98%.  Examination:  Physical Exam  Constitutional:      Appearance: Normal appearance.  HENT:     Head: Normocephalic and atraumatic.     Mouth/Throat:     Mouth: Mucous membranes are moist.  Eyes:     Extraocular Movements: Extraocular movements intact.  Cardiovascular:     Rate and Rhythm: Normal rate and regular rhythm.  Pulmonary:     Effort: Pulmonary effort is normal. No  respiratory distress.     Breath sounds: Normal breath sounds. No wheezing.  Abdominal:     General: Bowel sounds are normal. There is no distension.     Palpations: Abdomen is soft.     Tenderness: There is no abdominal tenderness.  Musculoskeletal:        General: Normal range of motion.     Cervical back: Normal range of motion and neck supple.  Skin:    General: Skin is warm and dry.  Neurological:     General: No focal deficit present.     Mental Status: She is alert.  Psychiatric:        Mood and Affect: Mood normal.      Consultants:  Pulm  Procedures:    Data Reviewed: Results for orders placed or performed during the hospital encounter of 07/06/23 (from the past 24 hours)  Glucose, capillary     Status: None   Collection Time: 07/14/23  4:40 PM  Result Value Ref Range   Glucose-Capillary 85 70 - 99 mg/dL   Comment 1 Notify RN   Glucose, capillary     Status: Abnormal   Collection Time: 07/14/23  9:07 PM  Result Value Ref Range   Glucose-Capillary 413 (H) 70 - 99 mg/dL  Glucose, capillary     Status: Abnormal   Collection Time: 07/15/23  7:52 AM  Result Value Ref Range   Glucose-Capillary 205 (H) 70 - 99 mg/dL  Glucose, capillary     Status: Abnormal   Collection Time: 07/15/23 12:02 PM  Result Value Ref Range   Glucose-Capillary 263 (H) 70 - 99 mg/dL    I have reviewed pertinent nursing notes, vitals, labs, and images as necessary. I have ordered labwork to follow up on as indicated.  I have reviewed the last notes from staff over past 24 hours. I have discussed patient's care plan and test results with nursing staff, CM/SW, and other staff as appropriate.  Time spent: Greater than 50% of the 55 minute visit was spent in counseling/coordination of care for the patient as laid out in the A&P.   LOS: 9 days   Lewie Chamber, MD [facility] 07/15/2023, 1:42 PM

## 2023-07-16 DIAGNOSIS — J189 Pneumonia, unspecified organism: Secondary | ICD-10-CM | POA: Diagnosis not present

## 2023-07-16 DIAGNOSIS — J9601 Acute respiratory failure with hypoxia: Secondary | ICD-10-CM | POA: Diagnosis not present

## 2023-07-16 LAB — GLUCOSE, CAPILLARY
Glucose-Capillary: 268 mg/dL — ABNORMAL HIGH (ref 70–99)
Glucose-Capillary: 233 mg/dL — ABNORMAL HIGH (ref 70–99)
Glucose-Capillary: 319 mg/dL — ABNORMAL HIGH (ref 70–99)
Glucose-Capillary: 302 mg/dL — ABNORMAL HIGH (ref 70–99)
Glucose-Capillary: 212 mg/dL — ABNORMAL HIGH (ref 70–99)
Glucose-Capillary: 201 mg/dL — ABNORMAL HIGH (ref 70–99)

## 2023-07-16 NOTE — TOC Progression Note (Signed)
Transition of Care [facility]) - Progression Note    Patient Details  Name: Courtney Bennett MRN: [id number] Date of Birth: February 12, 1966  Transition of Care [facility]) CM/SW Contact  Adrian Prows, RN Phone Number: 07/16/2023, 3:04 PM  Clinical Narrative:    PT recc SNF; pt requested TOC come back at a later time as she has visitors.   Expected Discharge Plan: Home/Self Care (Previous documention indicate patient lives in her [place]iece.) Barriers to Discharge: Continued Medical Work up  Expected Discharge Plan and Services In-house Referral: Clinical Social Work     Living arrangements for the past 2 months: Homeless                                       Social Determinants of Health (SDOH) Interventions SDOH Screenings   Food Insecurity: No Food Insecurity (07/06/2023)  Recent Concern: Food Insecurity - Food Insecurity Present (06/06/2023)  Housing: High Risk (07/06/2023)  Transportation Needs: No Transportation Needs (07/07/2023)  Recent Concern: Transportation Needs - Unmet Transportation Needs (06/06/2023)  Utilities: Not At Risk (07/07/2023)  Depression (PHQ2-9): Medium Risk (06/16/2019)  Financial Resource Strain: Not at Risk (05/13/2023)   Received from [facility]  Physical Activity: Not on File (04/01/2023)   Received from [facility]  Social Connections: Moderately Integrated (06/06/2023)  Stress: Not on File (04/01/2023)   Received from [facility]  Tobacco Use: Medium Risk (07/09/2023)    Readmission Risk Interventions    03/17/2023    1:13 PM  Readmission Risk Prevention Plan  Transportation Screening Complete  PCP or Specialist Appt within 5-7 Days Complete  Home Care Screening Complete  Medication Review (RN CM) Complete

## 2023-07-16 NOTE — TOC Progression Note (Signed)
Transition of Care [facility]) - Progression Note    Patient Details  Name: Courtney Bennett MRN: [id number] Date of Birth: 02-25-1966  Transition of Care [facility]) CM/SW Contact  Adrian Prows, RN Phone Number: 07/16/2023, 9:49 AM  Clinical Narrative:    Recc for SNF; pt remains on 5L HFNC; not ready for d/c to facility.   Expected Discharge Plan: Home/Self Care (Previous documention indicate patient lives in her [place] Niece.) Barriers to Discharge: Continued Medical Work up  Expected Discharge Plan and Services In-house Referral: Clinical Social Work     Living arrangements for the past 2 months: Homeless                                       Social Determinants of Health (SDOH) Interventions SDOH Screenings   Food Insecurity: No Food Insecurity (07/06/2023)  Recent Concern: Food Insecurity - Food Insecurity Present (06/06/2023)  Housing: High Risk (07/06/2023)  Transportation Needs: No Transportation Needs (07/07/2023)  Recent Concern: Transportation Needs - Unmet Transportation Needs (06/06/2023)  Utilities: Not At Risk (07/07/2023)  Depression (PHQ2-9): Medium Risk (06/16/2019)  Financial Resource Strain: Not at Risk (05/13/2023)   Received from [facility]  Physical Activity: Not on File (04/01/2023)   Received from [facility]  Social Connections: Moderately Integrated (06/06/2023)  Stress: Not on File (04/01/2023)   Received from [facility]  Tobacco Use: Medium Risk (07/09/2023)    Readmission Risk Interventions    03/17/2023    1:13 PM  Readmission Risk Prevention Plan  Transportation Screening Complete  PCP or Specialist Appt within 5-7 Days Complete  Home Care Screening Complete  Medication Review (RN CM) Complete

## 2023-07-16 NOTE — Progress Notes (Signed)
Progress Note    Courtney Bennett   ZOX:[id number]  DOB: 1965/08/26  DOA: 07/06/2023     10 PCP: Patient, No Pcp Per  Initial CC: cough, SOB  Hospital Course: Courtney Bennett is a 58 year old female with PMH COPD, fibromyalgia, diastolic CHF, HTN, DM I who presented with cough and shortness of breath.  Workup was consistent with pneumonia and complicated with developing a pneumothorax.  She was evaluated by pulmonology and required chest tube placement.  She also underwent bronchoscopy on 07/09/2023.  Chest tube was removed on 07/13/2023.  Interval History:  No events overnight. Remaining stable. Will see if O2 can be weaned more today. Hoping to get her ready for SNF by Monday.     Assessment and Plan: * Community acquired pneumonia of left upper lobe of lung - Met criteria for sepsis on admission with tachycardia, leukocytosis and CXR concerning for pneumonia - Completed Rocephin and azithromycin courses - CT chest with multifocal pneumonia, atypical in etiology - s/p bronch 2/7 - Cytology -> no malignant cells, benign bronchial cells and alveolar macrophages; pathology - benign lung tissue; AFB cultures NGTD; d/c airborne precautions - Negative covid, flu, RSV; MRSA PCR negative, urine strep negative, urine legionella negative -Outpatient follow-up with pulmonology in 4 to 6 weeks  Pneumothorax S/p chest tube per pulm - removed 2/11   Acute respiratory failure with hypoxia (HCC) - continue weaning O2 as able  Anasarca Suspect at least partially related to hypoalbuminemia Echo with EF 60-65%, no RWMA, grade 1 diastolic dysfunction Will follow UA -> negative protein on UA  DM (diabetes mellitus) (HCC) - continue SSI and CBG  - A1c 13.1% - will allow reg diet but if glucose elevates excessively will modify again   Old records reviewed in assessment of this patient  Antimicrobials: Azithromycin 07/06/2023 >> 07/08/2023 Rocephin 07/06/2023 >> 07/13/2023  DVT prophylaxis:  enoxaparin  (LOVENOX) injection 40 mg Start: 07/11/23 0800 SCDs Start: 07/06/23 1750   Code Status:   Code Status: Full Code  Mobility Assessment (Last 72 Hours)     Mobility Assessment     Row Name 07/15/23 2100 07/15/23 0710 07/14/23 1509 07/14/23 1217 07/14/23 0800   Does patient have an order for bedrest or is patient medically unstable No - Continue assessment No - Continue assessment -- -- No - Continue assessment   What is the highest level of mobility based on the progressive mobility assessment? Level 5 (Walks with assist in room/hall) - Balance while stepping forward/back and can walk in room with assist - Complete Level 5 (Walks with assist in room/hall) - Balance while stepping forward/back and can walk in room with assist - Complete Level 2 (Chairfast) - Balance while sitting on edge of bed and cannot stand Level 5 (Walks with assist in room/hall) - Balance while stepping forward/back and can walk in room with assist - Complete Level 5 (Walks with assist in room/hall) - Balance while stepping forward/back and can walk in room with assist - Complete   Is the above level different from baseline mobility prior to current illness? Yes - Recommend PT order Yes - Recommend PT order -- -- Yes - Recommend PT order            Barriers to discharge: none Disposition Plan:  SNF Status is: Inpt  Objective: Blood pressure 130/63, pulse 93, temperature 99 F (37.2 C), temperature source Oral, resp. rate 18, height 5\' 2"  (1.575 m), weight 45.8 kg, last menstrual period 11/24/2014, SpO2 96%.  Examination:  Physical Exam Constitutional:      Appearance: Normal appearance.  HENT:     Head: Normocephalic and atraumatic.     Mouth/Throat:     Mouth: Mucous membranes are moist.  Eyes:     Extraocular Movements: Extraocular movements intact.  Cardiovascular:     Rate and Rhythm: Normal rate and regular rhythm.  Pulmonary:     Effort: Pulmonary effort is normal. No respiratory distress.      Breath sounds: Normal breath sounds. No wheezing.  Abdominal:     General: Bowel sounds are normal. There is no distension.     Palpations: Abdomen is soft.     Tenderness: There is no abdominal tenderness.  Musculoskeletal:        General: Normal range of motion.     Cervical back: Normal range of motion and neck supple.  Skin:    General: Skin is warm and dry.  Neurological:     General: No focal deficit present.     Mental Status: She is alert.  Psychiatric:        Mood and Affect: Mood normal.      Consultants:  Pulm  Procedures:    Data Reviewed: Results for orders placed or performed during the hospital encounter of 07/06/23 (from the past 24 hours)  Glucose, capillary     Status: Abnormal   Collection Time: 07/15/23  4:05 PM  Result Value Ref Range   Glucose-Capillary 100 (H) 70 - 99 mg/dL  Glucose, capillary     Status: Abnormal   Collection Time: 07/15/23  8:37 PM  Result Value Ref Range   Glucose-Capillary 280 (H) 70 - 99 mg/dL  Glucose, capillary     Status: Abnormal   Collection Time: 07/16/23  1:07 AM  Result Value Ref Range   Glucose-Capillary 268 (H) 70 - 99 mg/dL  Glucose, capillary     Status: Abnormal   Collection Time: 07/16/23  7:36 AM  Result Value Ref Range   Glucose-Capillary 233 (H) 70 - 99 mg/dL  Glucose, capillary     Status: Abnormal   Collection Time: 07/16/23 11:36 AM  Result Value Ref Range   Glucose-Capillary 319 (H) 70 - 99 mg/dL    I have reviewed pertinent nursing notes, vitals, labs, and images as necessary. I have ordered labwork to follow up on as indicated.  I have reviewed the last notes from staff over past 24 hours. I have discussed patient's care plan and test results with nursing staff, CM/SW, and other staff as appropriate.  Time spent: Greater than 50% of the 55 minute visit was spent in counseling/coordination of care for the patient as laid out in the A&P.   LOS: 10 days   Lewie Chamber, MD [facility] 07/16/2023, 2:00 PM

## 2023-07-16 NOTE — Progress Notes (Signed)
Attempted to insert the IV, Pt is screaming, stiffness. Pt verbalized want to insert new IV in the morning.

## 2023-07-16 NOTE — Progress Notes (Signed)
Patient has and prefers personal supply of Ensure Max Protein at the bedside.

## 2023-07-16 NOTE — Care Plan (Signed)
Pt is non compliant with blood sugar coverage bc she wants her "meal"  Advised pt Food/snacks are offered at the time insulin is given. Insulin should by given 30-60 minutes within glucose check.   She has stated that she "may just go ahead and die". She does not have a plan. She may benefit from a psych consult.   Education given on oxygen therapy however she continues to take therapy off. She has been on RA quite frequently throughout shift.   Pt tearful most of the afternoon. Family at bedside for 15 minutes.

## 2023-07-16 NOTE — Progress Notes (Signed)
PT Cancellation Note  Patient Details Name: Courtney Bennett MRN: [id number] DOB: 1966-04-22   Cancelled Treatment:     Therapist in this pm for continued PT. Pt refused to participate, stating she needed shoes if she was going to walk or get OOB. Therapist explained slipper socks were available, pt ignored therapist, ultimately refusing.    Jannet Askew 07/16/2023, 4:46 PM

## 2023-07-17 DIAGNOSIS — J9601 Acute respiratory failure with hypoxia: Secondary | ICD-10-CM | POA: Diagnosis not present

## 2023-07-17 DIAGNOSIS — J189 Pneumonia, unspecified organism: Secondary | ICD-10-CM | POA: Diagnosis not present

## 2023-07-17 DIAGNOSIS — F310 Bipolar disorder, current episode hypomanic: Secondary | ICD-10-CM | POA: Diagnosis not present

## 2023-07-17 DIAGNOSIS — J939 Pneumothorax, unspecified: Secondary | ICD-10-CM | POA: Diagnosis not present

## 2023-07-17 LAB — GLUCOSE, CAPILLARY
Glucose-Capillary: 107 mg/dL — ABNORMAL HIGH (ref 70–99)
Glucose-Capillary: 227 mg/dL — ABNORMAL HIGH (ref 70–99)
Glucose-Capillary: 215 mg/dL — ABNORMAL HIGH (ref 70–99)
Glucose-Capillary: 410 mg/dL — ABNORMAL HIGH (ref 70–99)
Glucose-Capillary: 253 mg/dL — ABNORMAL HIGH (ref 70–99)

## 2023-07-17 NOTE — Assessment & Plan Note (Signed)
-   Blood pressure currently controlled without medication but has been on losartan previously

## 2023-07-17 NOTE — Progress Notes (Signed)
 Progress Note    Courtney Bennett   GNF:[id number]  DOB: 02/25/1966  DOA: 07/06/2023     11 PCP: Patient, No Pcp Per  Initial CC: cough, SOB  Hospital Course: [name] is a 58 year old female with PMH COPD, fibromyalgia, diastolic CHF, HTN, DM I who presented with cough and shortness of breath.  Workup was consistent with pneumonia and complicated with developing a pneumothorax.  She was evaluated by pulmonology and required chest tube placement.  She also underwent bronchoscopy on 07/09/2023.  Chest tube was removed on 07/13/2023.  Interval History:  Says she just "feels bad" this morning. Mood seems more depressed today and appetite is low. She does have tendency to be hypomanic with her bipolar.  She only has slipper socks and didn't want to walk with PT yesterday without shoes but I encouraged her to try and walk some today if able.    Assessment and Plan: * Community acquired pneumonia of left upper lobe of lung - Met criteria for sepsis on admission with tachycardia, leukocytosis and CXR concerning for pneumonia - Completed Rocephin and azithromycin courses - CT chest with multifocal pneumonia, atypical in etiology - s/p bronch 2/7 - Cytology -> no malignant cells, benign bronchial cells and alveolar macrophages; pathology - benign lung tissue; AFB cultures NGTD; d/c airborne precautions - Negative covid, flu, RSV; MRSA PCR negative, urine strep negative, urine legionella negative -Outpatient follow-up with pulmonology in 4 to 6 weeks  Pneumothorax S/p chest tube per pulm - removed 2/11   Acute respiratory failure with hypoxia (HCC) - continue weaning O2 as able  Bipolar affective disorder, current episode hypomanic (HCC) - recent hospitalization in January 2025 with paranoid and hypomania -Evaluated by psychiatry previously and was unwilling to take medication - She was recommended to follow-up with [facility] behavioral health at discharge  Grade I diastolic  dysfunction No signs or symptoms of exacerbation - Last echo 12/29/2022: EF 60 to 65%, grade 1 diastolic dysfunction, mild LVH  Essential hypertension - Blood pressure currently controlled without medication but has been on losartan previously  DM (diabetes mellitus) (HCC) - continue SSI and CBG  - A1c 13.1% - will allow reg diet but if glucose elevates excessively will modify again  Anasarca-resolved as of 07/16/2023 Suspect at least partially related to hypoalbuminemia Echo with EF 60-65%, no RWMA, grade 1 diastolic dysfunction Will follow UA -> negative protein on UA   Old records reviewed in assessment of this patient  Antimicrobials: Azithromycin 07/06/2023 >> 07/08/2023 Rocephin 07/06/2023 >> 07/13/2023  DVT prophylaxis:  enoxaparin (LOVENOX) injection 40 mg Start: 07/11/23 0800 SCDs Start: 07/06/23 1750   Code Status:   Code Status: Full Code  Mobility Assessment (Last 72 Hours)     Mobility Assessment     Row Name 07/17/23 1045 07/16/23 2009 07/16/23 0800 07/15/23 2100 07/15/23 0710   Does patient have an order for bedrest or is patient medically unstable No - Continue assessment No - Continue assessment No - Continue assessment No - Continue assessment No - Continue assessment   What is the highest level of mobility based on the progressive mobility assessment? Level 5 (Walks with assist in room/hall) - Balance while stepping forward/back and can walk in room with assist - Complete Level 5 (Walks with assist in room/hall) - Balance while stepping forward/back and can walk in room with assist - Complete Level 5 (Walks with assist in room/hall) - Balance while stepping forward/back and can walk in room with assist - Complete Level  5 (Walks with assist in room/hall) - Balance while stepping forward/back and can walk in room with assist - Complete Level 5 (Walks with assist in room/hall) - Balance while stepping forward/back and can walk in room with assist - Complete   Is the above  level different from baseline mobility prior to current illness? -- Yes - Recommend PT order Yes - Recommend PT order Yes - Recommend PT order Yes - Recommend PT order    Row Name 07/14/23 1509           What is the highest level of mobility based on the progressive mobility assessment? Level 2 (Chairfast) - Balance while sitting on edge of bed and cannot stand                Barriers to discharge: none Disposition Plan:  SNF Status is: Inpt  Objective: Blood pressure 130/69, pulse 77, temperature 98.7 F (37.1 C), resp. rate 16, height 5\' 2"  (1.575 m), weight 45.8 kg, last menstrual period 11/24/2014, SpO2 96%.  Examination:  Physical Exam Constitutional:      Appearance: Normal appearance.  HENT:     Head: Normocephalic and atraumatic.     Mouth/Throat:     Mouth: Mucous membranes are moist.  Eyes:     Extraocular Movements: Extraocular movements intact.  Cardiovascular:     Rate and Rhythm: Normal rate and regular rhythm.  Pulmonary:     Effort: Pulmonary effort is normal. No respiratory distress.     Breath sounds: Normal breath sounds. No wheezing.  Abdominal:     General: Bowel sounds are normal. There is no distension.     Palpations: Abdomen is soft.     Tenderness: There is no abdominal tenderness.  Musculoskeletal:        General: Normal range of motion.     Cervical back: Normal range of motion and neck supple.  Skin:    General: Skin is warm and dry.  Neurological:     General: No focal deficit present.     Mental Status: She is alert.  Psychiatric:        Attention and Perception: Attention normal.        Mood and Affect: Mood is depressed.        Behavior: Behavior is withdrawn. Behavior is cooperative.      Consultants:  Pulm  Procedures:    Data Reviewed: Results for orders placed or performed during the hospital encounter of 07/06/23 (from the past 24 hours)  Glucose, capillary     Status: Abnormal   Collection Time: 07/16/23  4:29 PM   Result Value Ref Range   Glucose-Capillary 302 (H) 70 - 99 mg/dL  Glucose, capillary     Status: Abnormal   Collection Time: 07/16/23  6:55 PM  Result Value Ref Range   Glucose-Capillary 212 (H) 70 - 99 mg/dL  Glucose, capillary     Status: Abnormal   Collection Time: 07/16/23  9:16 PM  Result Value Ref Range   Glucose-Capillary 201 (H) 70 - 99 mg/dL  Glucose, capillary     Status: Abnormal   Collection Time: 07/17/23  7:46 AM  Result Value Ref Range   Glucose-Capillary 107 (H) 70 - 99 mg/dL  Glucose, capillary     Status: Abnormal   Collection Time: 07/17/23 11:47 AM  Result Value Ref Range   Glucose-Capillary 227 (H) 70 - 99 mg/dL    I have reviewed pertinent nursing notes, vitals, labs, and images as necessary. I have  ordered labwork to follow up on as indicated.  I have reviewed the last notes from staff over past 24 hours. I have discussed patient's care plan and test results with nursing staff, CM/SW, and other staff as appropriate.  Time spent: Greater than 50% of the 55 minute visit was spent in counseling/coordination of care for the patient as laid out in the A&P.   LOS: 11 days   Lewie Chamber, MD [facility] 07/17/2023, 12:41 PM

## 2023-07-17 NOTE — Assessment & Plan Note (Signed)
-   recent hospitalization in January 2025 with paranoid and hypomania -Evaluated by psychiatry previously and was unwilling to take medication - She was recommended to follow-up with [facility] behavioral health at discharge

## 2023-07-17 NOTE — Assessment & Plan Note (Signed)
 No signs or symptoms of exacerbation - Last echo 12/29/2022: EF 60 to 65%, grade 1 diastolic dysfunction, mild LVH

## 2023-07-17 NOTE — Progress Notes (Signed)
 Chaplain visited pt in response to [facility] consult placed for support and prayer. Pt was alert, sitting in her recliner, finishing her lunch. Pt shared some of her life narrative reflecting on her childhood, her grandparents and her mom. Who are all deceased. Pt has a 58 yo son who has come to visit her periodically as he is able. Pt grew tearful intermittently throughout this conversation. In addition to the health issues she has been admitted for treatment, she is experiencing homelessness and financial difficulties. Pt needed shoes, which this chaplain was able to provide via the [facility] ED clothes closet. Chaplain also provided reflective listening, a compassionate presence and prayer at pt's request.  Chaplain Fuller Canada, Judie Petit Div   07/17/23 1500  Spiritual Encounters  Type of Visit Follow up  Care provided to: Patient  Referral source Nurse (RN/NT/LPN)  Reason for visit Routine spiritual support  OnCall Visit Yes  Spiritual Framework  Presenting Themes Values and beliefs;Significant life change;Impactful experiences and emotions  Values/beliefs Christian  Community/Connection Limited;Family  Patient Stress Factors Family relationships;Financial concerns;Health changes;Major life changes  Interventions  Spiritual Care Interventions Made Compassionate presence;Reflective listening;Normalization of emotions;Prayer  Intervention Outcomes  Outcomes Awareness of support;Reduced anxiety

## 2023-07-17 NOTE — Progress Notes (Signed)
 Physical Therapy Treatment Patient Details Name: Courtney Bennett MRN: [id number] DOB: [birth year] Today's Date: 07/17/2023   History of Present Illness Courtney Bennett is a 58 year old unhoused woman comes to ED 07/06/23 with  worsening SOB, weakness, fatigue,Community acquired pneumonia of left upper lobe of lung  . 2/7 Post bronchoscopy with transbronchial biopsies complicated by small left pneumothorax.Chest tube placed, removed 07/13/23. Transferred to ICU for respiratory distress. PMH: COPD ongoing tobacco use disorder who presents with 2 weeks of worsening cough, shortness of breath, weakness, fatigue.    PT Comments  Pt in recliner on arrival.  Chaplain had just visited and provided sneakers for pt. Pt assisted with donning shoes and first ambulated to bathroom per request. Pt then ambulated short distance in hallway. Pt remained on 3L O2 [place] and SPO2 88-93% during ambulation.  Pt encouraged to mobilize with staff as tolerated during admission.  Pt reports she plans to d/c to SNF if possible.    If plan is discharge home, recommend the following: A little help with walking and/or transfers;A little help with bathing/dressing/bathroom;Assistance with cooking/housework;Assist for transportation;Help with stairs or ramp for entrance   Can travel by private vehicle        Equipment Recommendations  Rolling walker (2 wheels)    Recommendations for Other Services       Precautions / Restrictions Precautions Precautions: Fall Precaution/Restrictions Comments: monitor sats     Mobility  Bed Mobility               General bed mobility comments: pt in recliner    Transfers Overall transfer level: Needs assistance Equipment used: None Transfers: Sit to/from Stand Sit to Stand: Contact guard assist           General transfer comment: CGA for safety    Ambulation/Gait Ambulation/Gait assistance: Contact guard assist Gait Distance (Feet): 120 Feet Assistive device: Rolling  walker (2 wheels) Gait Pattern/deviations: Step-through pattern, Decreased stride length Gait velocity: decr     General Gait Details: increased time, pt only reporting chest tube site pain, remained on 3L O2 [place] and SPO2 88-93% (briefly down to 87% upon return to room and switching from tank to wall)   Stairs             Wheelchair Mobility     Tilt Bed    Modified Rankin (Stroke Patients Only)       Balance Overall balance assessment: Mild deficits observed, not formally tested         Standing balance support: No upper extremity supported, During functional activity Standing balance-Leahy Scale: Fair Standing balance comment: static fair                            Communication Communication Communication: No apparent difficulties  Cognition Arousal: Alert Behavior During Therapy: Flat affect   PT - Cognitive impairments: No apparent impairments                       PT - Cognition Comments: appears more receptive to movement and use of oxygen today Following commands: Intact      Cueing    Exercises      General Comments        Pertinent Vitals/Pain Pain Assessment Pain Assessment: Faces Faces Pain Scale: Hurts little more Pain Location: left posterior chest - chest tube site Pain Descriptors / Indicators: Sore Pain Intervention(s): Repositioned, Monitored during session    Home Living  Prior Function            PT Goals (current goals can now be found in the care plan section) Progress towards PT goals: Progressing toward goals    Frequency    Min 1X/week      PT Plan      Co-evaluation              AM-PAC PT "6 Clicks" Mobility   Outcome Measure  Help needed turning from your back to your side while in a flat bed without using bedrails?: None Help needed moving from lying on your back to sitting on the side of a flat bed without using bedrails?: None Help  needed moving to and from a bed to a chair (including a wheelchair)?: A Little Help needed standing up from a chair using your arms (e.g., wheelchair or bedside chair)?: A Little Help needed to walk in hospital room?: A Little Help needed climbing 3-5 steps with a railing? : A Lot 6 Click Score: 19    End of Session Equipment Utilized During Treatment: Oxygen Activity Tolerance: Patient tolerated treatment well Patient left: in chair;with call bell/phone within reach Nurse Communication: Mobility status PT Visit Diagnosis: Difficulty in walking, not elsewhere classified (R26.2);Muscle weakness (generalized) (M62.81)     Time: 1610-9604 PT Time Calculation (min) (ACUTE ONLY): 23 min  Charges:    $Gait Training: 23-37 mins PT General Charges $$ ACUTE PT VISIT: 1 Visit                    Paulino Door, DPT Physical Therapist Acute Rehabilitation Services Office: [phone]    Courtney Bennett [place] 07/17/2023, 4:39 PM

## 2023-07-18 DIAGNOSIS — J9601 Acute respiratory failure with hypoxia: Secondary | ICD-10-CM | POA: Diagnosis not present

## 2023-07-18 DIAGNOSIS — J189 Pneumonia, unspecified organism: Secondary | ICD-10-CM | POA: Diagnosis not present

## 2023-07-18 DIAGNOSIS — J939 Pneumothorax, unspecified: Secondary | ICD-10-CM | POA: Diagnosis not present

## 2023-07-18 LAB — GLUCOSE, CAPILLARY
Glucose-Capillary: 317 mg/dL — ABNORMAL HIGH (ref 70–99)
Glucose-Capillary: 280 mg/dL — ABNORMAL HIGH (ref 70–99)
Glucose-Capillary: 185 mg/dL — ABNORMAL HIGH (ref 70–99)
Glucose-Capillary: 36 mg/dL — CL (ref 70–99)
Glucose-Capillary: 42 mg/dL — CL (ref 70–99)
Glucose-Capillary: 223 mg/dL — ABNORMAL HIGH (ref 70–99)
Glucose-Capillary: 290 mg/dL — ABNORMAL HIGH (ref 70–99)

## 2023-07-18 MED ORDER — INSULIN ASPART 100 UNIT/ML IJ SOLN
10.0000 [IU] | Freq: Three times a day (TID) | INTRAMUSCULAR | Status: DC
  Administered 2023-07-19 – 2023-07-21 (×5): 10 [IU] via SUBCUTANEOUS

## 2023-07-18 MED ORDER — DEXTROSE 50 % IV SOLN
25.0000 g | Freq: Once | INTRAVENOUS | Status: AC
  Administered 2023-07-18: 25 g via INTRAVENOUS
  Filled 2023-07-18: qty 50

## 2023-07-18 NOTE — [place] FL2 (Signed)
 [place] MEDICAID FL2 LEVEL OF CARE FORM     IDENTIFICATION  Patient Name: Courtney Bennett Birthdate: November 07, 1965 Sex: female Admission Date (Current Location): 07/06/2023  [facility] and [place] Number:  Producer, television/film/video and Address:  [facility],  [address] [place] [address]      Provider Number: [id number]  Attending Physician Name and Address:  Lewie Chamber, MD  Relative Name and Phone Number:  Courtney Bennett (brother) [phone]    Current Level of Care: Hospital Recommended Level of Care: Skilled Nursing Facility Prior Approval Number:    Date Approved/Denied:   PASRR Number: [id number]  Discharge Plan: SNF    Current Diagnoses: Patient Active Problem List   Diagnosis Date Noted   Pneumothorax 07/14/2023   Community acquired pneumonia of left upper lobe of lung 07/06/2023   Suicidal ideation 06/09/2023   Uncontrolled type 2 diabetes mellitus with hyperglycemia (HCC) 06/06/2023   Insect bites 06/06/2023   Grade I diastolic dysfunction 06/06/2023   Acute respiratory failure with hypoxemia (HCC) 03/15/2023   COPD with acute exacerbation (HCC) 03/14/2023   Acute respiratory failure with hypoxia (HCC) 03/14/2023   Protein-calorie malnutrition, severe 12/31/2022   Atypical chest pain 12/29/2022   Essential hypertension 12/29/2022   DM (diabetes mellitus) (HCC) 12/01/2022   Gastric distention 11/30/2022   Hav (hallux abducto valgus), unspecified laterality 11/04/2022   COPD (chronic obstructive pulmonary disease) (HCC) 01/22/2022   Homeless 01/22/2022   Ulcer of right foot (HCC) 01/22/2022   Tobacco dependence 12/27/2019   Aspiration pneumonia (HCC) 12/10/2019   Paroxysmal atrial flutter (HCC) 12/10/2019   Aspiration into airway 12/10/2019   Fibromyalgia 02/27/2019   Hx of unilateral oophorectomy 01/31/2019   Bipolar affective disorder, current episode hypomanic (HCC) 08/10/2014   Paranoid (HCC) 08/10/2014    Orientation RESPIRATION  BLADDER Height & Weight     Self, Time, Situation  O2 Continent Weight: 45.8 kg Height:  5\' 2"  (157.5 cm)  BEHAVIORAL SYMPTOMS/MOOD NEUROLOGICAL BOWEL NUTRITION STATUS      Continent Diet (regular)  AMBULATORY STATUS COMMUNICATION OF NEEDS Skin   Limited Assist Verbally Skin abrasions (abrasion right back)                       Personal Care Assistance Level of Assistance  Bathing, Feeding, Dressing Bathing Assistance: Limited assistance Feeding assistance: Independent Dressing Assistance: Limited assistance     Functional Limitations Info  Sight, Hearing, Speech Sight Info: Impaired (glasses) Hearing Info: Adequate Speech Info: Adequate    SPECIAL CARE FACTORS FREQUENCY  PT (By licensed PT), OT (By licensed OT)     PT Frequency: 5x/week OT Frequency: 5x/week            Contractures Contractures Info: Not present    Additional Factors Info  Code Status, Allergies, Insulin Sliding Scale Code Status Info: Full Allergies Info: Nickel, Oxycodone-acetaminophen, Metformin, Other, Elemental Sulfur, Lamictal (Lamotrigine), Latex, Sulfa Antibiotics, Tape   Insulin Sliding Scale Info: See MAR       Current Medications (07/18/2023):  This is the current hospital active medication list Current Facility-Administered Medications  Medication Dose Route Frequency Provider Last Rate Last Admin   albuterol (PROVENTIL) (2.5 MG/3ML) 0.083% nebulizer solution 2.5 mg  2.5 mg Nebulization Q4H PRN Lewie Chamber, MD   2.5 mg at 07/09/23 1530   amLODipine (NORVASC) tablet 10 mg  10 mg Oral Daily Lewie Chamber, MD   10 mg at 07/18/23 0919   Chlorhexidine Gluconate Cloth 2 % PADS 6  each  6 each Topical Daily Lewie Chamber, MD   6 each at 07/18/23 0981   diphenhydrAMINE (BENADRYL) capsule 25 mg  25 mg Oral Q6H PRN Lewie Chamber, MD   25 mg at 07/13/23 0159   enoxaparin (LOVENOX) injection 40 mg  40 mg Subcutaneous Q24H Lewie Chamber, MD   40 mg at 07/15/23 0825   feeding supplement  (ENSURE ENLIVE / ENSURE PLUS) liquid 237 mL  237 mL Oral TID BM Lewie Chamber, MD   237 mL at 07/17/23 1044   fluticasone (FLONASE) 50 MCG/ACT nasal spray 2 spray  2 spray Each Nare Daily Lewie Chamber, MD   2 spray at 07/18/23 0924   guaiFENesin-dextromethorphan (ROBITUSSIN DM) 100-10 MG/5ML syrup 5 mL  5 mL Oral Q4H PRN Lewie Chamber, MD   5 mL at 07/15/23 2333   hydrALAZINE (APRESOLINE) injection 10 mg  10 mg Intravenous Q6H PRN Lewie Chamber, MD   10 mg at 07/13/23 0042   insulin aspart (novoLOG) injection 0-5 Units  0-5 Units Subcutaneous Axel Filler, MD   3 Units at 07/17/23 2153   insulin aspart (novoLOG) injection 0-9 Units  0-9 Units Subcutaneous TID WC Lewie Chamber, MD   7 Units at 07/18/23 0912   insulin aspart (novoLOG) injection 10 Units  10 Units Subcutaneous TID WC Lewie Chamber, MD   10 Units at 07/18/23 0913   insulin glargine-yfgn (SEMGLEE) injection 20 Units  20 Units Subcutaneous Daily Lewie Chamber, MD   20 Units at 07/18/23 0914   ipratropium-albuterol (DUONEB) 0.5-2.5 (3) MG/3ML nebulizer solution 3 mL  3 mL Nebulization BID Lewie Chamber, MD   3 mL at 07/18/23 0840   lidocaine (LIDODERM) 5 % 1 patch  1 patch Transdermal Daily Lewie Chamber, MD   1 patch at 07/12/23 1146   losartan (COZAAR) tablet 25 mg  25 mg Oral Daily Lewie Chamber, MD   25 mg at 07/18/23 0919   methocarbamol (ROBAXIN) tablet 500 mg  500 mg Oral Q8H PRN Lewie Chamber, MD   500 mg at 07/12/23 2049   Oral care mouth rinse  15 mL Mouth Rinse PRN Lewie Chamber, MD       polyethylene glycol (MIRALAX / GLYCOLAX) packet 17 g  17 g Oral Daily PRN Lewie Chamber, MD   17 g at 07/12/23 1149   sodium chloride (OCEAN) 0.65 % nasal spray 1 spray  1 spray Each Nare PRN Lewie Chamber, MD       sodium chloride flush (NS) 0.9 % injection 10 mL  10 mL Intrapleural Eliezer Lofts, MD   10 mL at 07/15/23 1914   sodium chloride flush (NS) 0.9 % injection 3 mL  3 mL Intravenous Orlene Erm, MD   3  mL at 07/18/23 7829     Discharge Medications: Please see discharge summary for a list of discharge medications.  Relevant Imaging Results:  Relevant Lab Results:   Additional Information SSN [id number]  Courtney Prows, RN

## 2023-07-18 NOTE — Progress Notes (Signed)
 1438: Pt found in bathroom crying, stating she was upset with her brother. Pt complained of sweating and "Not feeling right". Pt assisted back to bed, vitals and CBG checked. Vitals stable, CBG found to be hypoglycemic at 36. Pt talking and able to take orals, given orange juice and crackers. Checked 15 min later, found to be 42. MD placed orders for 25g of IV dextrose and to recheck CBG in an hour.   1602: CBG checked, 223.

## 2023-07-18 NOTE — Progress Notes (Signed)
   07/17/23 1812  Provider Notification  Provider Name/Title Dr. Frederick Peers  Date Provider Notified 07/17/23  Time Provider Notified 1812  Method of Notification Page (secure chat)  Notification Reason Other (Comment) (refusal of insulin at breakfast and lunch time)  Provider response No new orders

## 2023-07-18 NOTE — Progress Notes (Signed)
 Progress Note    Courtney Bennett   ZOX:[id number]  DOB: [birth year]  DOA: 07/06/2023     12 PCP: Patient, No Pcp Per  Initial CC: cough, SOB  Hospital Course: Courtney Bennett is a 58 year old female with PMH COPD, fibromyalgia, diastolic CHF, HTN, DM I who presented with cough and shortness of breath.  Workup was consistent with pneumonia and complicated with developing a pneumothorax.  She was evaluated by pulmonology and required chest tube placement.  She also underwent bronchoscopy on 07/09/2023.  Chest tube was removed on 07/13/2023.  Interval History:  Mood better some today.  In regards to compliance, she's pretty adamant to do things the way she wants and while that's okay, I think that speaks a lot to the control of  her chronic problems, notably diabetes.  She does wish to no longer smoke at discharge, which I commended her for.    Assessment and Plan: * Community acquired pneumonia of left upper lobe of lung - Met criteria for sepsis on admission with tachycardia, leukocytosis and CXR concerning for pneumonia - Completed Rocephin and azithromycin courses - CT chest with multifocal pneumonia, atypical in etiology - s/p bronch 2/7 - Cytology -> no malignant cells, benign bronchial cells and alveolar macrophages; pathology - benign lung tissue; AFB cultures NGTD; d/c airborne precautions - Negative covid, flu, RSV; MRSA PCR negative, urine strep negative, urine legionella negative -Outpatient follow-up with pulmonology in 4 to 6 weeks  Pneumothorax S/p chest tube per pulm - removed 2/11   Acute respiratory failure with hypoxia (HCC) - continue weaning O2 as able  Bipolar affective disorder, current episode hypomanic (HCC) - recent hospitalization in January 2025 with paranoid and hypomania -Evaluated by psychiatry previously and was unwilling to take medication - She was recommended to follow-up with [facility] behavioral health at discharge  Grade I diastolic  dysfunction No signs or symptoms of exacerbation - Last echo 12/29/2022: EF 60 to 65%, grade 1 diastolic dysfunction, mild LVH  Essential hypertension - Blood pressure currently controlled without medication but has been on losartan previously  DM (diabetes mellitus) (HCC) - continue SSI and CBG  - A1c 13.1% - will allow reg diet but if glucose elevates excessively will modify again  Anasarca-resolved as of 07/16/2023 Suspect at least partially related to hypoalbuminemia Echo with EF 60-65%, no RWMA, grade 1 diastolic dysfunction Will follow UA -> negative protein on UA   Old records reviewed in assessment of this patient  Antimicrobials: Azithromycin 07/06/2023 >> 07/08/2023 Rocephin 07/06/2023 >> 07/13/2023  DVT prophylaxis:  enoxaparin (LOVENOX) injection 40 mg Start: 07/11/23 0800 SCDs Start: 07/06/23 1750   Code Status:   Code Status: Full Code  Mobility Assessment (Last 72 Hours)     Mobility Assessment     Row Name 07/18/23 0948 07/18/23 0900 07/17/23 1930 07/17/23 1638 07/17/23 1045   Does patient have an order for bedrest or is patient medically unstable No - Continue assessment No - Continue assessment No - Continue assessment -- No - Continue assessment   What is the highest level of mobility based on the progressive mobility assessment? Level 5 (Walks with assist in room/hall) - Balance while stepping forward/back and can walk in room with assist - Complete Level 5 (Walks with assist in room/hall) - Balance while stepping forward/back and can walk in room with assist - Complete Level 5 (Walks with assist in room/hall) - Balance while stepping forward/back and can walk in room with assist - Complete Level 5 (Walks with  assist in room/hall) - Balance while stepping forward/back and can walk in room with assist - Complete Level 5 (Walks with assist in room/hall) - Balance while stepping forward/back and can walk in room with assist - Complete   Is the above level different from  baseline mobility prior to current illness? -- -- Yes - Recommend PT order -- --    Row Name 07/16/23 2009 07/16/23 0800 07/15/23 2100       Does patient have an order for bedrest or is patient medically unstable No - Continue assessment No - Continue assessment No - Continue assessment     What is the highest level of mobility based on the progressive mobility assessment? Level 5 (Walks with assist in room/hall) - Balance while stepping forward/back and can walk in room with assist - Complete Level 5 (Walks with assist in room/hall) - Balance while stepping forward/back and can walk in room with assist - Complete Level 5 (Walks with assist in room/hall) - Balance while stepping forward/back and can walk in room with assist - Complete     Is the above level different from baseline mobility prior to current illness? Yes - Recommend PT order Yes - Recommend PT order Yes - Recommend PT order              Barriers to discharge: none Disposition Plan:  SNF Status is: Inpt  Objective: Blood pressure 124/70, pulse 93, temperature 97.8 F (36.6 C), temperature source Oral, resp. rate 16, height 5\' 2"  (1.575 m), weight 45.8 kg, last menstrual period 11/24/2014, SpO2 95%.  Examination:  Physical Exam Constitutional:      Appearance: Normal appearance.  HENT:     Head: Normocephalic and atraumatic.     Mouth/Throat:     Mouth: Mucous membranes are moist.  Eyes:     Extraocular Movements: Extraocular movements intact.  Cardiovascular:     Rate and Rhythm: Normal rate and regular rhythm.  Pulmonary:     Effort: Pulmonary effort is normal. No respiratory distress.     Breath sounds: Normal breath sounds. No wheezing.  Abdominal:     General: Bowel sounds are normal. There is no distension.     Palpations: Abdomen is soft.     Tenderness: There is no abdominal tenderness.  Musculoskeletal:        General: Normal range of motion.     Cervical back: Normal range of motion and neck supple.   Skin:    General: Skin is warm and dry.  Neurological:     General: No focal deficit present.     Mental Status: She is alert.  Psychiatric:        Attention and Perception: Attention normal.        Mood and Affect: Mood normal. Mood is not depressed.        Behavior: Behavior is cooperative.      Consultants:  Pulm  Procedures:    Data Reviewed: Results for orders placed or performed during the hospital encounter of 07/06/23 (from the past 24 hours)  Glucose, capillary     Status: Abnormal   Collection Time: 07/17/23  1:51 PM  Result Value Ref Range   Glucose-Capillary 215 (H) 70 - 99 mg/dL  Glucose, capillary     Status: Abnormal   Collection Time: 07/17/23  4:19 PM  Result Value Ref Range   Glucose-Capillary 410 (H) 70 - 99 mg/dL  Glucose, capillary     Status: Abnormal   Collection Time: 07/17/23  8:57 PM  Result Value Ref Range   Glucose-Capillary 253 (H) 70 - 99 mg/dL  Glucose, capillary     Status: Abnormal   Collection Time: 07/18/23  7:37 AM  Result Value Ref Range   Glucose-Capillary 317 (H) 70 - 99 mg/dL  Glucose, capillary     Status: Abnormal   Collection Time: 07/18/23  9:23 AM  Result Value Ref Range   Glucose-Capillary 280 (H) 70 - 99 mg/dL  Glucose, capillary     Status: Abnormal   Collection Time: 07/18/23 12:05 PM  Result Value Ref Range   Glucose-Capillary 185 (H) 70 - 99 mg/dL    I have reviewed pertinent nursing notes, vitals, labs, and images as necessary. I have ordered labwork to follow up on as indicated.  I have reviewed the last notes from staff over past 24 hours. I have discussed patient's care plan and test results with nursing staff, CM/SW, and other staff as appropriate.  Time spent: Greater than 50% of the 55 minute visit was spent in counseling/coordination of care for the patient as laid out in the A&P.   LOS: 12 days   Lewie Chamber, MD [facility] 07/18/2023, 1:27 PM

## 2023-07-18 NOTE — TOC Progression Note (Addendum)
 Transition of Care [facility]) - Progression Note    Patient Details  Name: Courtney Bennett MRN: [id number] Date of Birth: 1966-01-25  [facility] CM/SW Contact  Courtney Prows, RN Phone Number: 07/18/2023, 1:41 PM  Clinical Narrative:    PT recc SNF; spoke w/ pt in room; she agrees to recc; explained SNF process and ins auth needed; pt verbalized understanding; pt says she does not have a facility preference but would like search limited to [facility]; she gave physical address [address] [place], [place] [address]; she also says her POC is brother Courtney Bennett [phone]; pt also says she has glasses and bottom dentures; obtained PASRR #  [id number]; FL2 completed; faxed out; awaiting bed offers; ins auth.   Expected Discharge Plan: Home/Self Care (Previous documention indicate patient lives in her [place] [facility].) Barriers to Discharge: Continued Medical Work up  Expected Discharge Plan and Services In-house Referral: Clinical Social Work     Living arrangements for the past 2 months: Homeless                                       Social Determinants of Health (SDOH) Interventions SDOH Screenings   Food Insecurity: No Food Insecurity (07/06/2023)  Recent Concern: Food Insecurity - Food Insecurity Present (06/06/2023)  Housing: High Risk (07/06/2023)  Transportation Needs: No Transportation Needs (07/07/2023)  Recent Concern: Transportation Needs - Unmet Transportation Needs (06/06/2023)  Utilities: Not At Risk (07/07/2023)  Depression (PHQ2-9): Medium Risk (06/16/2019)  Financial Resource Strain: Not at Risk (05/13/2023)   Received from [facility]  Physical Activity: Not on File (04/01/2023)   Received from [facility]  Social Connections: Moderately Integrated (06/06/2023)  Stress: Not on File (04/01/2023)   Received from [facility]  Tobacco Use: Medium Risk (07/09/2023)    Readmission Risk Interventions    03/17/2023    1:13 PM  Readmission Risk Prevention  Plan  Transportation Screening Complete  PCP or Specialist Appt within 5-7 Days Complete  Home Care Screening Complete  Medication Review (RN CM) Complete

## 2023-07-19 DIAGNOSIS — J9601 Acute respiratory failure with hypoxia: Secondary | ICD-10-CM | POA: Diagnosis not present

## 2023-07-19 DIAGNOSIS — J189 Pneumonia, unspecified organism: Secondary | ICD-10-CM | POA: Diagnosis not present

## 2023-07-19 DIAGNOSIS — J939 Pneumothorax, unspecified: Secondary | ICD-10-CM | POA: Diagnosis not present

## 2023-07-19 LAB — GLUCOSE, CAPILLARY
Glucose-Capillary: 234 mg/dL — ABNORMAL HIGH (ref 70–99)
Glucose-Capillary: 151 mg/dL — ABNORMAL HIGH (ref 70–99)
Glucose-Capillary: 131 mg/dL — ABNORMAL HIGH (ref 70–99)
Glucose-Capillary: 284 mg/dL — ABNORMAL HIGH (ref 70–99)

## 2023-07-19 NOTE — Inpatient Diabetes Management (Addendum)
 Inpatient Diabetes Program Recommendations  AACE/ADA: New Consensus Statement on Inpatient Glycemic Control (2015)  Target Ranges:  Prepandial:   less than 140 mg/dL      Peak postprandial:   less than 180 mg/dL (1-2 hours)      Critically ill patients:  140 - 180 mg/dL   Lab Results  Component Value Date   GLUCAP 234 (H) 07/19/2023   HGBA1C 13.1 (H) 06/05/2023    Review of Glycemic Control  Latest Reference Range & Units 07/18/23 09:23 07/18/23 12:05 07/18/23 14:38 07/18/23 14:56 07/18/23 16:02 07/18/23 19:48 07/19/23 07:44  Glucose-Capillary 70 - 99 mg/dL 914 (H)  Novolog 17 units  185 (H) 36 (LL) 42 (LL) 223 (H) 290 (H) 234 (H)  (LL): Data is critically low (H): Data is abnormally high  Diabetes history: T1DM Outpatient Diabetes medications: Tresiba 5-14 units TID, Humalog 8 units TID with meals, Dexcom G7 Current orders for Inpatient glycemic control: Semglee 20 units TID, Novolog 0-9 units TID and 0-5 units at bedtime, 10 units TID  Please consider: Keeping Semglee 20 units every day Decrease meal coverage to 6 units TID if she consumes at least 50%   Discharge Recommendations: Long acting recommendations: Insulin Degludec (TRESIBA) FlexTouch Pen 20 units  Short acting recommendations:  Meal coverage ONLY Insulin lispro (HUMALOG) KwikPen  6 units TIDMC     Use Adult Diabetes Insulin Treatment Post Discharge order set.  Met with patient at bedside.  Had a long conversation about DM1, living situation, lack of refrigeration due to being homeless, importance of wearing her Dexcom and administering basal insulin everyday.    She has not seen a NP or MD in almost a year.  Per [facility] medical note she was taking [place] 5 units every day and Humalog 8 units TID with meals.  She does not eat consistently.  She has several [place] pens and 2 Humalog pens at her temporary pallet housing in [place].  Currently since it is cold outside these pens should be fine; once it  starts to warm up it will be difficult to keep her pens cool.  Recommended she get a small cooler and keep insulin protected with ice if possible.    She needs to establish with a PCP.  She states she is willing to follow up with a PCP; will ask TOC for assistance.    Will place an OP pharmacy referral for follow up.  Asked her to answer her phone as they will reach out to her in 1-3 days.    Reviewed patient's current A1c of 13.1%. Explained what a A1c is and what it measures. Also reviewed goal A1c with patient, importance of good glucose control @ home, and blood sugar goals.  She is well aware this is not optimal control of her DM.  Encouraged her to keep a close eye on her CGM glucose trends.    Will continue to follow while inpatient.  Thank you, Courtney Sellar, MSN, CDCES Diabetes Coordinator Inpatient Diabetes Program [phone] (team pager from 8a-5p)

## 2023-07-19 NOTE — TOC Progression Note (Signed)
 Transition of Care [facility]) - Progression Note    Patient Details  Name: Courtney Bennett MRN: [id number] Date of Birth: Aug 15, 1965  Transition of Care [facility]) CM/SW Contact  Navin Dogan, Olegario Messier, RN Phone Number: 07/19/2023, 2:14 PM  Clinical Narrative:Provided patient w/ST SNF bed offers await choice prior auth.       Expected Discharge Plan: Skilled Nursing Facility Barriers to Discharge: Continued Medical Work up  Expected Discharge Plan and Services In-house Referral: Clinical Social Work     Living arrangements for the past 2 months: Homeless                                       Social Determinants of Health (SDOH) Interventions SDOH Screenings   Food Insecurity: No Food Insecurity (07/06/2023)  Recent Concern: Food Insecurity - Food Insecurity Present (06/06/2023)  Housing: High Risk (07/06/2023)  Transportation Needs: No Transportation Needs (07/07/2023)  Recent Concern: Transportation Needs - Unmet Transportation Needs (06/06/2023)  Utilities: Not At Risk (07/07/2023)  Depression (PHQ2-9): Medium Risk (06/16/2019)  Financial Resource Strain: Not at Risk (05/13/2023)   Received from [facility]  Physical Activity: Not on File (04/01/2023)   Received from [facility]  Social Connections: Moderately Integrated (06/06/2023)  Stress: Not on File (04/01/2023)   Received from [facility]  Tobacco Use: Medium Risk (07/09/2023)    Readmission Risk Interventions    03/17/2023    1:13 PM  Readmission Risk Prevention Plan  Transportation Screening Complete  PCP or Specialist Appt within 5-7 Days Complete  Home Care Screening Complete  Medication Review (RN CM) Complete

## 2023-07-19 NOTE — Progress Notes (Signed)
 Physical Therapy Treatment Patient Details Name: Courtney Bennett MRN: [id number] DOB: 1966-04-26 Today's Date: 07/19/2023   History of Present Illness Courtney Bennett is a 58 year old unhoused woman comes to ED 07/06/23 with  worsening SOB, weakness, fatigue,Community acquired pneumonia of left upper lobe of lung  . 2/7 Post bronchoscopy with transbronchial biopsies complicated by small left pneumothorax.Chest tube placed, removed 07/13/23. Transferred to ICU for respiratory distress. PMH: COPD ongoing tobacco use disorder who presents with 2 weeks of worsening cough, shortness of breath, weakness, fatigue.    PT Comments  Today's PT session focused on ambulation/activity tolerance. Pt able to ambulate ~177ft and additional ~69ft with no AD and furniture reaching with single UE support. All mobility performed while pt on RA, lowest O2 sat observed briefly 89%, otherwise 90-95% throughout on RA.  Pt will benefit from continued skilled PT to increase their independence and maximize safety with mobility.      If plan is discharge home, recommend the following: A little help with walking and/or transfers;A little help with bathing/dressing/bathroom;Assistance with cooking/housework;Assist for transportation;Help with stairs or ramp for entrance   Can travel by private vehicle     Yes  Equipment Recommendations  Rolling walker (2 wheels)    Recommendations for Other Services       Precautions / Restrictions Precautions Precautions: Fall Restrictions Weight Bearing Restrictions Per Provider Order: No     Mobility  Bed Mobility               General bed mobility comments: Pt sitting EOB upon PT entry    Transfers Overall transfer level: Needs assistance Equipment used: Rolling walker (2 wheels) Transfers: Sit to/from Stand Sit to Stand: Supervision           General transfer comment: supervision for safety, slightly increased time to achieve full stand     Ambulation/Gait Ambulation/Gait assistance: Contact guard assist Gait Distance (Feet): 130 Feet Assistive device: Rolling walker (2 wheels), None Gait Pattern/deviations: Step-through pattern, Decreased stride length Gait velocity: decr     General Gait Details: Sneakers donned for ambulation. Pt on RA upon PT entry, O2 sats 93-95% with O2 low briefly of 89% during mobility while on RA. RN updated. Additional 11ft without use of AD, pt furniture reachign with single UE reports "I still feel like I need to grab for something" no overt LOB observed, slowed speed. Pt tearful at end of ambulation reporting not feeling well. RN notified.   Stairs             Wheelchair Mobility     Tilt Bed    Modified Rankin (Stroke Patients Only)       Balance Overall balance assessment: Mild deficits observed, not formally tested                                          Communication Communication Communication: No apparent difficulties  Cognition Arousal: Alert Behavior During Therapy: Flat affect   PT - Cognitive impairments: No apparent impairments                         Following commands: Intact      Cueing    Exercises      General Comments        Pertinent Vitals/Pain Pain Assessment Pain Assessment: Faces Faces Pain Scale: Hurts little more Pain Location: complaining of  sore throat and  sinus congestion (reports due to her [place] O2) Pain Descriptors / Indicators: Sore Pain Intervention(s): Limited activity within patient's tolerance, Monitored during session    Home Living                          Prior Function            PT Goals (current goals can now be found in the care plan section) Acute Rehab PT Goals Patient Stated Goal: Want to feel better PT Goal Formulation: With patient Time For Goal Achievement: 07/28/23 Potential to Achieve Goals: Fair Progress towards PT goals: Progressing toward goals     Frequency    Min 1X/week      PT Plan      Co-evaluation              AM-PAC PT "6 Clicks" Mobility   Outcome Measure  Help needed turning from your back to your side while in a flat bed without using bedrails?: None Help needed moving from lying on your back to sitting on the side of a flat bed without using bedrails?: None Help needed moving to and from a bed to a chair (including a wheelchair)?: A Little Help needed standing up from a chair using your arms (e.g., wheelchair or bedside chair)?: A Little Help needed to walk in hospital room?: A Little Help needed climbing 3-5 steps with a railing? : A Lot 6 Click Score: 19    End of Session Equipment Utilized During Treatment: Gait belt Activity Tolerance: Patient tolerated treatment well Patient left: in chair;with call bell/phone within reach (RN notified that posey box chair alarm not working (green light not turning on)) Nurse Communication: Mobility status PT Visit Diagnosis: Difficulty in walking, not elsewhere classified (R26.2);Muscle weakness (generalized) (M62.81)     Time: 3086-5784 PT Time Calculation (min) (ACUTE ONLY): 26 min  Charges:    $Therapeutic Activity: 23-37 mins PT General Charges $$ ACUTE PT VISIT: 1 Visit                     Lyman Speller PT, DPT  Acute Rehabilitation Services  Office [phone]   07/19/2023, 1:16 PM

## 2023-07-19 NOTE — Progress Notes (Signed)
 Progress Note    Courtney Bennett   ZHY:[id number]  DOB: 08-15-1965  DOA: 07/06/2023     13 PCP: Patient, No Pcp Per  Initial CC: cough, SOB  Hospital Course: Courtney Bennett is a 58 year old female with PMH COPD, fibromyalgia, diastolic CHF, HTN, DM I who presented with cough and shortness of breath.  Workup was consistent with pneumonia and complicated with developing a pneumothorax.  She was evaluated by pulmonology and required chest tube placement.  She also underwent bronchoscopy on 07/09/2023.  Chest tube was removed on 07/13/2023.  Interval History:  No events overnight.  Still awaiting SNF placement at this time.  She otherwise feels okay.  Mood about the same.   Assessment and Plan: * Community acquired pneumonia of left upper lobe of lung - Met criteria for sepsis on admission with tachycardia, leukocytosis and CXR concerning for pneumonia - Completed Rocephin and azithromycin courses - CT chest with multifocal pneumonia, atypical in etiology - s/p bronch 2/7 - Cytology -> no malignant cells, benign bronchial cells and alveolar macrophages; pathology - benign lung tissue; AFB cultures NGTD; d/c airborne precautions - Negative covid, flu, RSV; MRSA PCR negative, urine strep negative, urine legionella negative -Outpatient follow-up with pulmonology in 4 to 6 weeks  Pneumothorax S/p chest tube per pulm - removed 2/11   Acute respiratory failure with hypoxia (HCC) - continue weaning O2 as able  Bipolar affective disorder, current episode hypomanic (HCC) - recent hospitalization in January 2025 with paranoid and hypomania -Evaluated by psychiatry previously and was unwilling to take medication - She was recommended to follow-up with [facility] behavioral health at discharge  Grade I diastolic dysfunction No signs or symptoms of exacerbation - Last echo 12/29/2022: EF 60 to 65%, grade 1 diastolic dysfunction, mild LVH  Essential hypertension - Blood pressure currently  controlled without medication but has been on losartan previously  DM (diabetes mellitus) (HCC) - continue SSI and CBG  - A1c 13.1% - will allow reg diet but if glucose elevates excessively will modify again  Anasarca-resolved as of 07/16/2023 Suspect at least partially related to hypoalbuminemia Echo with EF 60-65%, no RWMA, grade 1 diastolic dysfunction Will follow UA -> negative protein on UA   Old records reviewed in assessment of this patient  Antimicrobials: Azithromycin 07/06/2023 >> 07/08/2023 Rocephin 07/06/2023 >> 07/13/2023  DVT prophylaxis:  enoxaparin (LOVENOX) injection 40 mg Start: 07/11/23 0800 SCDs Start: 07/06/23 1750   Code Status:   Code Status: Full Code  Mobility Assessment (Last 72 Hours)     Mobility Assessment     Row Name 07/19/23 1300 07/19/23 1012 07/18/23 2025 07/18/23 0948 07/18/23 0900   Does patient have an order for bedrest or is patient medically unstable -- No - Continue assessment No - Continue assessment No - Continue assessment No - Continue assessment   What is the highest level of mobility based on the progressive mobility assessment? Level 5 (Walks with assist in room/hall) - Balance while stepping forward/back and can walk in room with assist - Complete Level 6 (Walks independently in room and hall) - Balance while walking in room without assist - Complete Level 5 (Walks with assist in room/hall) - Balance while stepping forward/back and can walk in room with assist - Complete Level 5 (Walks with assist in room/hall) - Balance while stepping forward/back and can walk in room with assist - Complete Level 5 (Walks with assist in room/hall) - Balance while stepping forward/back and can walk in room with assist -  Complete    Row Name 07/17/23 1930 07/17/23 1638 07/17/23 1045 07/16/23 2009     Does patient have an order for bedrest or is patient medically unstable No - Continue assessment -- No - Continue assessment No - Continue assessment    What is  the highest level of mobility based on the progressive mobility assessment? Level 5 (Walks with assist in room/hall) - Balance while stepping forward/back and can walk in room with assist - Complete Level 5 (Walks with assist in room/hall) - Balance while stepping forward/back and can walk in room with assist - Complete Level 5 (Walks with assist in room/hall) - Balance while stepping forward/back and can walk in room with assist - Complete Level 5 (Walks with assist in room/hall) - Balance while stepping forward/back and can walk in room with assist - Complete    Is the above level different from baseline mobility prior to current illness? Yes - Recommend PT order -- -- Yes - Recommend PT order             Barriers to discharge: none Disposition Plan:  SNF Status is: Inpt  Objective: Blood pressure 132/65, pulse 86, temperature 98.4 F (36.9 C), temperature source Oral, resp. rate 18, height 5\' 2"  (1.575 m), weight 45.8 kg, last menstrual period 11/24/2014, SpO2 93%.  Examination:  Physical Exam Constitutional:      Appearance: Normal appearance.  HENT:     Head: Normocephalic and atraumatic.     Mouth/Throat:     Mouth: Mucous membranes are moist.  Eyes:     Extraocular Movements: Extraocular movements intact.  Cardiovascular:     Rate and Rhythm: Normal rate and regular rhythm.  Pulmonary:     Effort: Pulmonary effort is normal. No respiratory distress.     Breath sounds: Normal breath sounds. No wheezing.  Abdominal:     General: Bowel sounds are normal. There is no distension.     Palpations: Abdomen is soft.     Tenderness: There is no abdominal tenderness.  Musculoskeletal:        General: Normal range of motion.     Cervical back: Normal range of motion and neck supple.  Skin:    General: Skin is warm and dry.  Neurological:     General: No focal deficit present.     Mental Status: She is alert.  Psychiatric:        Attention and Perception: Attention normal.         Mood and Affect: Mood normal. Mood is not depressed.        Behavior: Behavior is cooperative.      Consultants:  Pulm  Procedures:    Data Reviewed: Results for orders placed or performed during the hospital encounter of 07/06/23 (from the past 24 hours)  Glucose, capillary     Status: Abnormal   Collection Time: 07/18/23  2:38 PM  Result Value Ref Range   Glucose-Capillary 36 (LL) 70 - 99 mg/dL   Comment 1 RN AT   Glucose, capillary     Status: Abnormal   Collection Time: 07/18/23  2:56 PM  Result Value Ref Range   Glucose-Capillary 42 (LL) 70 - 99 mg/dL  Glucose, capillary     Status: Abnormal   Collection Time: 07/18/23  4:02 PM  Result Value Ref Range   Glucose-Capillary 223 (H) 70 - 99 mg/dL  Glucose, capillary     Status: Abnormal   Collection Time: 07/18/23  7:48 PM  Result Value Ref  Range   Glucose-Capillary 290 (H) 70 - 99 mg/dL  Glucose, capillary     Status: Abnormal   Collection Time: 07/19/23  7:44 AM  Result Value Ref Range   Glucose-Capillary 234 (H) 70 - 99 mg/dL  Glucose, capillary     Status: Abnormal   Collection Time: 07/19/23 11:48 AM  Result Value Ref Range   Glucose-Capillary 151 (H) 70 - 99 mg/dL    I have reviewed pertinent nursing notes, vitals, labs, and images as necessary. I have ordered labwork to follow up on as indicated.  I have reviewed the last notes from staff over past 24 hours. I have discussed patient's care plan and test results with nursing staff, CM/SW, and other staff as appropriate.    LOS: 13 days   Lewie Chamber, MD [facility] 07/19/2023, 2:12 PM

## 2023-07-20 DIAGNOSIS — J189 Pneumonia, unspecified organism: Secondary | ICD-10-CM | POA: Diagnosis not present

## 2023-07-20 DIAGNOSIS — J9601 Acute respiratory failure with hypoxia: Secondary | ICD-10-CM | POA: Diagnosis not present

## 2023-07-20 LAB — GLUCOSE, CAPILLARY
Glucose-Capillary: 193 mg/dL — ABNORMAL HIGH (ref 70–99)
Glucose-Capillary: 207 mg/dL — ABNORMAL HIGH (ref 70–99)
Glucose-Capillary: 88 mg/dL (ref 70–99)
Glucose-Capillary: 117 mg/dL — ABNORMAL HIGH (ref 70–99)

## 2023-07-20 MED ORDER — SIMETHICONE 80 MG PO CHEW
80.0000 mg | CHEWABLE_TABLET | Freq: Four times a day (QID) | ORAL | Status: DC | PRN
Start: 2023-07-20 — End: 2023-07-21

## 2023-07-20 MED ORDER — CALCIUM CARBONATE ANTACID 500 MG PO CHEW
2.0000 | CHEWABLE_TABLET | Freq: Three times a day (TID) | ORAL | Status: DC | PRN
  Administered 2023-07-20: 400 mg via ORAL
  Filled 2023-07-20: qty 2

## 2023-07-20 MED ORDER — ALUM & MAG HYDROXIDE-SIMETH 200-200-20 MG/5ML PO SUSP
30.0000 mL | ORAL | Status: DC | PRN
Start: 2023-07-20 — End: 2023-07-21
  Administered 2023-07-20: 30 mL via ORAL
  Filled 2023-07-20: qty 30

## 2023-07-20 NOTE — TOC Progression Note (Signed)
 Transition of Care [facility]) - Progression Note    Patient Details  Name: Courtney Bennett MRN: [id number] Date of Birth: [birth year]  [facility]) CM/SW Contact  Kaiya Boatman, Olegario Messier, RN Phone Number: 07/20/2023, 3:46 PM  Clinical Narrative:  [name] for [facility] pending [id number] await auth.     Expected Discharge Plan: Skilled Nursing Facility Barriers to Discharge: Insurance Authorization  Expected Discharge Plan and Services In-house Referral: Clinical Social Work     Living arrangements for the past 2 months: Homeless                                       Social Determinants of Health (SDOH) Interventions SDOH Screenings   Food Insecurity: No Food Insecurity (07/06/2023)  Recent Concern: Food Insecurity - Food Insecurity Present (06/06/2023)  Housing: High Risk (07/06/2023)  Transportation Needs: No Transportation Needs (07/07/2023)  Recent Concern: Transportation Needs - Unmet Transportation Needs (06/06/2023)  Utilities: Not At Risk (07/07/2023)  Depression (PHQ2-9): Medium Risk (06/16/2019)  Financial Resource Strain: Not at Risk (05/13/2023)   Received from [facility]  Physical Activity: Not on File (04/01/2023)   Received from [facility]  Social Connections: Moderately Integrated (06/06/2023)  Stress: Not on File (04/01/2023)   Received from [facility]  Tobacco Use: Medium Risk (07/09/2023)    Readmission Risk Interventions    03/17/2023    1:13 PM  Readmission Risk Prevention Plan  Transportation Screening Complete  PCP or Specialist Appt within 5-7 Days Complete  Home Care Screening Complete  Medication Review (RN CM) Complete

## 2023-07-20 NOTE — Progress Notes (Signed)
 Progress Note    Courtney Bennett   ZOX:[id number]  DOB: 02/10/1966  DOA: 07/06/2023     14 PCP: Patient, No Pcp Per  Initial CC: cough, SOB  Hospital Course: [name] is a 58 year old female with PMH COPD, fibromyalgia, diastolic CHF, HTN, DM I who presented with cough and shortness of breath.  Workup was consistent with pneumonia and complicated with developing a pneumothorax.  She was evaluated by pulmonology and required chest tube placement.  She also underwent bronchoscopy on 07/09/2023.  Chest tube was removed on 07/13/2023.  Interval History:  No events overnight. Some left abdominal pain "sharp" in nature this morning, but benign on exam when seen. Will trial some maalox etc to see if helps. Otherwise, she's doing okay.  She now has SNF bed offers and waiting on her to choose 1.   Assessment and Plan: * Community acquired pneumonia of left upper lobe of lung - Met criteria for sepsis on admission with tachycardia, leukocytosis and CXR concerning for pneumonia - Completed Rocephin and azithromycin courses - CT chest with multifocal pneumonia, atypical in etiology - s/p bronch 2/7 - Cytology -> no malignant cells, benign bronchial cells and alveolar macrophages; pathology - benign lung tissue; AFB cultures NGTD; d/c airborne precautions - Negative covid, flu, RSV; MRSA PCR negative, urine strep negative, urine legionella negative -Outpatient follow-up with pulmonology in 4 to 6 weeks  Pneumothorax S/p chest tube per pulm - removed 2/11   Acute respiratory failure with hypoxia (HCC) - continue weaning O2 as able  Bipolar affective disorder, current episode hypomanic (HCC) - recent hospitalization in January 2025 with paranoid and hypomania -Evaluated by psychiatry previously and was unwilling to take medication - She was recommended to follow-up with [name] behavioral health at discharge  Grade I diastolic dysfunction No signs or symptoms of exacerbation - Last  echo 12/29/2022: EF 60 to 65%, grade 1 diastolic dysfunction, mild LVH  Essential hypertension - Blood pressure currently controlled without medication but has been on losartan previously  DM (diabetes mellitus) (HCC) - continue SSI and CBG  - A1c 13.1% - will allow reg diet but if glucose elevates excessively will modify again  Anasarca-resolved as of 07/16/2023 Suspect at least partially related to hypoalbuminemia Echo with EF 60-65%, no RWMA, grade 1 diastolic dysfunction Will follow UA -> negative protein on UA   Old records reviewed in assessment of this patient  Antimicrobials: Azithromycin 07/06/2023 >> 07/08/2023 Rocephin 07/06/2023 >> 07/13/2023  DVT prophylaxis:  enoxaparin (LOVENOX) injection 40 mg Start: 07/11/23 0800 SCDs Start: 07/06/23 1750   Code Status:   Code Status: Full Code  Mobility Assessment (Last 72 Hours)     Mobility Assessment     Row Name 07/20/23 1231 07/20/23 0840 07/19/23 2000 07/19/23 1300 07/19/23 1012   Does patient have an order for bedrest or is patient medically unstable -- No - Continue assessment No - Continue assessment -- No - Continue assessment   What is the highest level of mobility based on the progressive mobility assessment? Level 5 (Walks with assist in room/hall) - Balance while stepping forward/back and can walk in room with assist - Complete Level 5 (Walks with assist in room/hall) - Balance while stepping forward/back and can walk in room with assist - Complete Level 5 (Walks with assist in room/hall) - Balance while stepping forward/back and can walk in room with assist - Complete Level 5 (Walks with assist in room/hall) - Balance while stepping forward/back and can walk in room  with assist - Complete Level 6 (Walks independently in room and hall) - Balance while walking in room without assist - Complete    Row Name 07/18/23 2025 07/18/23 0948 07/18/23 0900 07/17/23 1930 07/17/23 1638   Does patient have an order for bedrest or is  patient medically unstable No - Continue assessment No - Continue assessment No - Continue assessment No - Continue assessment --   What is the highest level of mobility based on the progressive mobility assessment? Level 5 (Walks with assist in room/hall) - Balance while stepping forward/back and can walk in room with assist - Complete Level 5 (Walks with assist in room/hall) - Balance while stepping forward/back and can walk in room with assist - Complete Level 5 (Walks with assist in room/hall) - Balance while stepping forward/back and can walk in room with assist - Complete Level 5 (Walks with assist in room/hall) - Balance while stepping forward/back and can walk in room with assist - Complete Level 5 (Walks with assist in room/hall) - Balance while stepping forward/back and can walk in room with assist - Complete   Is the above level different from baseline mobility prior to current illness? -- -- -- Yes - Recommend PT order --            Barriers to discharge: none Disposition Plan:  SNF Status is: Inpt  Objective: Blood pressure 135/74, pulse 86, temperature 97.7 F (36.5 C), resp. rate 20, height 5\' 2"  (1.575 m), weight 45.8 kg, last menstrual period 11/24/2014, SpO2 94%.  Examination:  Physical Exam Constitutional:      Appearance: Normal appearance.  HENT:     Head: Normocephalic and atraumatic.     Mouth/Throat:     Mouth: Mucous membranes are moist.  Eyes:     Extraocular Movements: Extraocular movements intact.  Cardiovascular:     Rate and Rhythm: Normal rate and regular rhythm.  Pulmonary:     Effort: Pulmonary effort is normal. No respiratory distress.     Breath sounds: Normal breath sounds. No wheezing.  Abdominal:     General: Bowel sounds are normal. There is no distension.     Palpations: Abdomen is soft.     Tenderness: There is no abdominal tenderness.  Musculoskeletal:        General: Normal range of motion.     Cervical back: Normal range of motion and  neck supple.  Skin:    General: Skin is warm and dry.  Neurological:     General: No focal deficit present.     Mental Status: She is alert.  Psychiatric:        Attention and Perception: Attention normal.        Mood and Affect: Mood normal. Mood is not depressed.        Behavior: Behavior is cooperative.      Consultants:  Pulm  Procedures:    Data Reviewed: Results for orders placed or performed during the hospital encounter of 07/06/23 (from the past 24 hours)  Glucose, capillary     Status: Abnormal   Collection Time: 07/19/23  4:47 PM  Result Value Ref Range   Glucose-Capillary 131 (H) 70 - 99 mg/dL  Glucose, capillary     Status: Abnormal   Collection Time: 07/19/23  9:14 PM  Result Value Ref Range   Glucose-Capillary 284 (H) 70 - 99 mg/dL  Glucose, capillary     Status: Abnormal   Collection Time: 07/20/23  7:58 AM  Result Value Ref Range  Glucose-Capillary 193 (H) 70 - 99 mg/dL  Glucose, capillary     Status: Abnormal   Collection Time: 07/20/23 11:20 AM  Result Value Ref Range   Glucose-Capillary 207 (H) 70 - 99 mg/dL    I have reviewed pertinent nursing notes, vitals, labs, and images as necessary. I have ordered labwork to follow up on as indicated.  I have reviewed the last notes from staff over past 24 hours. I have discussed patient's care plan and test results with nursing staff, CM/SW, and other staff as appropriate.    LOS: 14 days   Lewie Chamber, MD [facility] 07/20/2023, 1:47 PM

## 2023-07-20 NOTE — Progress Notes (Signed)
 Occupational Therapy Treatment Patient Details Name: Courtney Bennett MRN: [id number] DOB: Mar 10, 1966 Today's Date: 07/20/2023   History of present illness Courtney Bennett is a 58 year old unhoused woman comes to ED 07/06/23 with  worsening SOB, weakness, fatigue,Community acquired pneumonia of left upper lobe of lung  . 2/7 Post bronchoscopy with transbronchial biopsies complicated by small left pneumothorax.Chest tube placed, removed 07/13/23. Transferred to ICU for respiratory distress. PMH: COPD ongoing tobacco use disorder who presents with 2 weeks of worsening cough, shortness of breath, weakness, fatigue.   OT comments  Patient was noted to make improvements towards goals. Patient was able to complete LB Dressing sitting in recliner with set up and functional mobility in hallway with CGA with cues for obstacle navigation and stopping when one UE comes off walker with drifting noted without cues. Patient will benefit from continued inpatient follow up therapy, <3 hours/day.       If plan is discharge home, recommend the following:  A lot of help with bathing/dressing/bathroom;A lot of help with walking and/or transfers;Assistance with cooking/housework;Direct supervision/assist for medications management;Assist for transportation;Help with stairs or ramp for entrance;Direct supervision/assist for financial management   Equipment Recommendations  None recommended by OT       Precautions / Restrictions Precautions Precautions: Fall Precaution/Restrictions Comments: monitor sats Restrictions Weight Bearing Restrictions Per Provider Order: No       Mobility Bed Mobility               General bed mobility comments: OOB in recliner upon arrival and returned to same at end.            ADL either performed or assessed with clinical judgement   ADL Overall ADL's : Needs assistance/impaired       Grooming Details (indicate cue type and reason): reported that she did this already  today with NT prior to arrival                               General ADL Comments: patient was able to engage in functional mobility in hallway with patient occasionally taking hand off walker and holding abdomen with some off balance with this while patient keeps walking. patient appeated to have poor insight to balance issues with taking hand off walker with walker drifting towards what ever side was still holding walker.      Cognition Arousal: Alert Behavior During Therapy: Flat affect               OT - Cognition Comments: patient was reporting that she was feeling better than in ICU                                        Pertinent Vitals/ Pain       Pain Assessment Pain Assessment: Faces Faces Pain Scale: Hurts little more Pain Location: abdomen Pain Descriptors / Indicators: Sore Pain Intervention(s): Other (comment) (reported she told MD and has been having this for a few days)         Frequency  Min 1X/week        Progress Toward Goals  OT Goals(current goals can now be found in the care plan section)  Progress towards OT goals: Progressing toward goals     Plan         AM-PAC OT "6 Clicks" Daily Activity  Outcome Measure   Help from another person eating meals?: None Help from another person taking care of personal grooming?: A Little Help from another person toileting, which includes using toliet, bedpan, or urinal?: A Little Help from another person bathing (including washing, rinsing, drying)?: A Little Help from another person to put on and taking off regular upper body clothing?: A Little Help from another person to put on and taking off regular lower body clothing?: A Little 6 Click Score: 19    End of Session Equipment Utilized During Treatment: Gait belt;Rolling walker (2 wheels)  OT Visit Diagnosis: Unsteadiness on feet (R26.81);Other abnormalities of gait and mobility (R26.89)   Activity Tolerance  Patient tolerated treatment well   Patient Left in chair;with call bell/phone within reach   Nurse Communication Other (comment) (chair alarm unable to change batteries with nurse going to get new pose box at this time)        Time: 1610-9604 OT Time Calculation (min): 20 min  Charges: OT General Charges $OT Visit: 1 Visit OT Treatments $Self Care/Home Management : 8-22 mins  Rosalio Loud, MS Acute Rehabilitation Department Office# [phone]   Selinda Flavin 07/20/2023, 12:35 PM

## 2023-07-21 DIAGNOSIS — J189 Pneumonia, unspecified organism: Secondary | ICD-10-CM | POA: Diagnosis not present

## 2023-07-21 LAB — GLUCOSE, CAPILLARY
Glucose-Capillary: 180 mg/dL — ABNORMAL HIGH (ref 70–99)
Glucose-Capillary: 296 mg/dL — ABNORMAL HIGH (ref 70–99)

## 2023-07-21 MED ORDER — POLYETHYLENE GLYCOL 3350 17 G PO PACK: 17.0000 g | PACK | Freq: Every day | ORAL | Status: AC | PRN

## 2023-07-21 MED ORDER — INSULIN ASPART 100 UNIT/ML IJ SOLN
0.0000 [IU] | Freq: Three times a day (TID) | INTRAMUSCULAR | Status: DC
Start: 2023-07-21 — End: 2023-08-30

## 2023-07-21 MED ORDER — PSEUDOEPHEDRINE HCL ER 120 MG PO TB12
120.0000 mg | ORAL_TABLET | Freq: Two times a day (BID) | ORAL | Status: DC
  Administered 2023-07-21: 120 mg via ORAL
  Filled 2023-07-21 (×2): qty 1

## 2023-07-21 MED ORDER — ALUM & MAG HYDROXIDE-SIMETH 200-200-20 MG/5ML PO SUSP: 30.0000 mL | ORAL | Status: AC | PRN

## 2023-07-21 MED ORDER — GUAIFENESIN ER 600 MG PO TB12
600.0000 mg | ORAL_TABLET | Freq: Two times a day (BID) | ORAL | Status: DC
  Administered 2023-07-21: 600 mg via ORAL
  Filled 2023-07-21: qty 1

## 2023-07-21 MED ORDER — SALINE SPRAY 0.65 % NA SOLN
1.0000 | Freq: Two times a day (BID) | NASAL | Status: DC
  Administered 2023-07-21: 1 via NASAL
  Filled 2023-07-21: qty 44

## 2023-07-21 MED ORDER — INSULIN ASPART 100 UNIT/ML IJ SOLN: 10.0000 [IU] | Freq: Three times a day (TID) | INTRAMUSCULAR | Status: DC

## 2023-07-21 MED ORDER — INSULIN GLARGINE-YFGN 100 UNIT/ML ~~LOC~~ SOLN: 20.0000 [IU] | Freq: Every day | SUBCUTANEOUS | Status: DC

## 2023-07-21 MED ORDER — LOSARTAN POTASSIUM 25 MG PO TABS
25.0000 mg | ORAL_TABLET | Freq: Every day | ORAL | Status: DC
Start: 2023-07-21 — End: 2023-09-13

## 2023-07-21 MED ORDER — GUAIFENESIN ER 600 MG PO TB12: 600.0000 mg | ORAL_TABLET | Freq: Two times a day (BID) | ORAL | Status: AC

## 2023-07-21 MED ORDER — FLUTICASONE PROPIONATE 50 MCG/ACT NA SUSP: 2.0000 | Freq: Every day | NASAL | Status: DC

## 2023-07-21 NOTE — Progress Notes (Signed)
 Chaplain brought [employer] prior to discharge.

## 2023-07-21 NOTE — TOC Transition Note (Addendum)
 Transition of Care [facility]) - Discharge Note   Patient Details  Name: Courtney Bennett MRN: [id number] Date of Birth: Mar 29, 1966  [facility] CM/SW Contact:  Lanier Clam, RN Phone Number: 07/21/2023, 12:14 PM   Clinical Narrative: Provided reassurance to patient of d/c by PTAR to [facility] since patient concerned about her belongings @ [employer] in [place]-eyeglasses, dentures-recommended patient to have family to get items after or while she is at [facility] & we will arrange PTAR.Per nsg chaplain to provided clothes.No further CM needs. -going to [facility] rm#[id number], report #[phone].  -2:41p PTAR called. No further CM needs.     Final next level of care: Skilled Nursing Facility Barriers to Discharge: No Barriers Identified   Patient Goals and CMS Choice Patient states their goals for this hospitalization and ongoing recovery are:: rehab CMS Medicare.gov Compare Post Acute Care list provided to:: Patient Choice offered to / list presented to : Patient [place] ownership interest in [facility].provided to:: Patient    Discharge Placement              Patient chooses bed at: [facility] Patient to be transferred to facility by: patient deciding if family will transport Name of family member notified: Patient Patient and family notified of of transfer: 07/21/23  Discharge Plan and Services Additional resources added to the After Visit Summary for   In-house Referral: Clinical Social Work   Post Acute Care Choice: Skilled Nursing Facility                               Social Drivers of Health (SDOH) Interventions SDOH Screenings   Food Insecurity: No Food Insecurity (07/06/2023)  Recent Concern: Food Insecurity - Food Insecurity Present (06/06/2023)  Housing: High Risk (07/06/2023)  Transportation Needs: No Transportation Needs (07/07/2023)  Recent Concern: Transportation Needs - Unmet Transportation Needs (06/06/2023)  Utilities: Not At Risk  (07/07/2023)  Depression (PHQ2-9): Medium Risk (06/16/2019)  Financial Resource Strain: Not at Risk (05/13/2023)   Received from [facility]  Physical Activity: Not on File (04/01/2023)   Received from [facility]  Social Connections: Moderately Integrated (06/06/2023)  Stress: Not on File (04/01/2023)   Received from [facility]  Tobacco Use: Medium Risk (07/09/2023)     Readmission Risk Interventions    03/17/2023    1:13 PM  Readmission Risk Prevention Plan  Transportation Screening Complete  PCP or Specialist Appt within 5-7 Days Complete  Home Care Screening Complete  Medication Review (RN CM) Complete

## 2023-07-21 NOTE — Progress Notes (Signed)
 Patient discharged from [facility]E Room 1414 per order. No PIV in place. Patient was able to shower. Dry gauze with Med[employer] tape placed to multiple abrasions on back and chest tube insertion site. Clothing provided by chaplain services. Patient declined PTAR. Patient stated she was going to get a ride to a previous residence so that she can get the remainder of her belongings. Patient was taken to front entrance via wheelchair and assisted into a POV. Discharge instructions were given to and reviewed with patient. Questions/concerns were addressed.

## 2023-07-21 NOTE — Plan of Care (Signed)
 Problem: Education: Goal: Ability to describe self-care measures that may prevent or decrease complications (Diabetes Survival Skills Education) will improve Outcome: Adequate for Discharge Goal: Individualized Educational Video(s) Outcome: Adequate for Discharge   Problem: Coping: Goal: Ability to adjust to condition or change in health will improve Outcome: Adequate for Discharge   Problem: Fluid Volume: Goal: Ability to maintain a balanced intake and output will improve Outcome: Adequate for Discharge   Problem: Health Behavior/Discharge Planning: Goal: Ability to identify and utilize available resources and services will improve Outcome: Adequate for Discharge Goal: Ability to manage health-related needs will improve Outcome: Adequate for Discharge   Problem: Metabolic: Goal: Ability to maintain appropriate glucose levels will improve Outcome: Adequate for Discharge   Problem: Nutritional: Goal: Maintenance of adequate nutrition will improve Outcome: Adequate for Discharge Goal: Progress toward achieving an optimal weight will improve Outcome: Adequate for Discharge   Problem: Skin Integrity: Goal: Risk for impaired skin integrity will decrease Outcome: Adequate for Discharge   Problem: Tissue Perfusion: Goal: Adequacy of tissue perfusion will improve Outcome: Adequate for Discharge   Problem: Education: Goal: Knowledge of General Education information will improve Description: Including pain rating scale, medication(s)/side effects and non-pharmacologic comfort measures Outcome: Adequate for Discharge   Problem: Health Behavior/Discharge Planning: Goal: Ability to manage health-related needs will improve Outcome: Adequate for Discharge   Problem: Clinical Measurements: Goal: Ability to maintain clinical measurements within normal limits will improve Outcome: Adequate for Discharge Goal: Will remain free from infection Outcome: Adequate for Discharge Goal:  Diagnostic test results will improve Outcome: Adequate for Discharge Goal: Respiratory complications will improve Outcome: Adequate for Discharge Goal: Cardiovascular complication will be avoided Outcome: Adequate for Discharge   Problem: Activity: Goal: Risk for activity intolerance will decrease Outcome: Adequate for Discharge   Problem: Nutrition: Goal: Adequate nutrition will be maintained Outcome: Adequate for Discharge   Problem: Coping: Goal: Level of anxiety will decrease Outcome: Adequate for Discharge   Problem: Elimination: Goal: Will not experience complications related to bowel motility Outcome: Adequate for Discharge Goal: Will not experience complications related to urinary retention Outcome: Adequate for Discharge   Problem: Pain Managment: Goal: General experience of comfort will improve and/or be controlled Outcome: Adequate for Discharge   Problem: Safety: Goal: Ability to remain free from injury will improve Outcome: Adequate for Discharge   Problem: Skin Integrity: Goal: Risk for impaired skin integrity will decrease Outcome: Adequate for Discharge   Problem: Fluid Volume: Goal: Hemodynamic stability will improve Outcome: Adequate for Discharge   Problem: Clinical Measurements: Goal: Diagnostic test results will improve Outcome: Adequate for Discharge Goal: Signs and symptoms of infection will decrease Outcome: Adequate for Discharge   Problem: Respiratory: Goal: Ability to maintain adequate ventilation will improve Outcome: Adequate for Discharge   Problem: Education: Goal: Knowledge of General Education information will improve Description: Including pain rating scale, medication(s)/side effects and non-pharmacologic comfort measures Outcome: Adequate for Discharge   Problem: Health Behavior/Discharge Planning: Goal: Ability to manage health-related needs will improve Outcome: Adequate for Discharge   Problem: Clinical  Measurements: Goal: Ability to maintain clinical measurements within normal limits will improve Outcome: Adequate for Discharge Goal: Will remain free from infection Outcome: Adequate for Discharge Goal: Diagnostic test results will improve Outcome: Adequate for Discharge Goal: Respiratory complications will improve Outcome: Adequate for Discharge Goal: Cardiovascular complication will be avoided Outcome: Adequate for Discharge   Problem: Activity: Goal: Risk for activity intolerance will decrease Outcome: Adequate for Discharge   Problem: Nutrition: Goal: Adequate nutrition will  be maintained Outcome: Adequate for Discharge   Problem: Coping: Goal: Level of anxiety will decrease Outcome: Adequate for Discharge   Problem: Elimination: Goal: Will not experience complications related to bowel motility Outcome: Adequate for Discharge Goal: Will not experience complications related to urinary retention Outcome: Adequate for Discharge   Problem: Pain Managment: Goal: General experience of comfort will improve and/or be controlled Outcome: Adequate for Discharge   Problem: Safety: Goal: Ability to remain free from injury will improve Outcome: Adequate for Discharge   Problem: Skin Integrity: Goal: Risk for impaired skin integrity will decrease Outcome: Adequate for Discharge

## 2023-07-21 NOTE — Discharge Summary (Signed)
 Physician Discharge Summary  Patient: Courtney Bennett NWG:[id number] DOB: November 06, 1965   Code Status: Full Code Admit date: 07/06/2023 Discharge date: 07/21/2023 Disposition: SNF, PT, OT, nurse aid, and RN PCP: Patient, No Pcp Per  Recommendations for Outpatient Follow-up:  Follow up with PCP within 1-2 weeks Regarding general hospital follow up and preventative care  Discharge Diagnoses:  Principal Problem:   Community acquired pneumonia of left upper lobe of lung Active Problems:   Acute respiratory failure with hypoxia (HCC)   Pneumothorax   Bipolar affective disorder, current episode hypomanic (HCC)   DM (diabetes mellitus) (HCC)   Essential hypertension   Grade I diastolic dysfunction  Brief Hospital Course Summary: Courtney Bennett is a 58 year old female with PMH COPD, fibromyalgia, diastolic CHF, HTN, DM I who presented with cough and shortness of breath. Workup was consistent with pneumonia and complicated with developing a pneumothorax. She was evaluated by pulmonology and required chest tube placement. She also underwent bronchoscopy on 07/09/2023. Chest tube was removed on 07/13/2023.   Since removal, she has been recovering well.  She has been able to wean from oxygen and is stable on room air even with exertion.  She has completed her antibiotic course and will continue on supportive care only for residual ill symptoms including Mucinex and Sudafed as needed.  All other chronic conditions were treated with home medications.    Discharge Condition: Good, improved Recommended discharge diet: Regular healthy diet  Consultations: Pulmonology  Procedures/Studies: Chest tube  Discharge Instructions     AMB Referral [facility] Care Management   Complete by: As directed    T1DM, Homeless, A1C 13.1%, has a phone, requesting help out patient with medications and keeping them cool.  TOC referral placed for PCP needs.  She needs a lot of follow up.   Expected date of contact: Emergent -  3 Days   Service: Pharmacy   Pharmacy Service For:  Disease Management Medication Adherence     Disease states to manage: Diabetes   Discharge patient   Complete by: As directed    Discharge disposition: 03-Skilled Nursing Facility   Discharge patient date: 07/21/2023      Allergies as of 07/21/2023       Reactions   Nickel Hives, Rash, Other (See Comments)   Rash after ear piercing and navel piercings     Oxycodone-acetaminophen Nausea And Vomiting   Metformin Diarrhea   Other Nausea And Vomiting, Other (See Comments)   Opiates- "They make me very sick."   Elemental Sulfur Hives, Rash, Other (See Comments)   "Burns the skin," also (and, had a "flu-like reaction"   Lamictal [lamotrigine] Rash   Latex Itching, Rash   Sulfa Antibiotics Rash   Tape Rash, Other (See Comments)   Irritates the skin        Medication List     STOP taking these medications    aspirin EC 81 MG tablet   DAYQUIL MULTI-SYMPTOM PO   FreeStyle Libre 14 Day Sensor Misc   gabapentin 100 MG capsule Commonly known as: NEURONTIN   HumaLOG KwikPen 200 UNIT/ML KwikPen Generic drug: insulin lispro   Insulin Pen Needle 32G X 4 MM Misc   ketoconazole 2 % shampoo Commonly known as: NIZORAL   Premarin 0.3 MG tablet Generic drug: estrogens (conjugated)   Evaristo Bury FlexTouch 100 UNIT/ML FlexTouch Pen Generic drug: insulin degludec       TAKE these medications    alum & mag hydroxide-simeth 200-200-20 MG/5ML suspension Commonly known as: MAALOX/MYLANTA  Take 30 mLs by mouth every 4 (four) hours as needed for indigestion, heartburn or flatulence.   amLODipine 10 MG tablet Commonly known as: NORVASC Take 1 tablet (10 mg total) by mouth daily.   Cough Drops Menthol Lozg Use as directed 1 lozenge in the mouth or throat every 2 (two) hours as needed (for a sore throat).   fluticasone 50 MCG/ACT nasal spray Commonly known as: FLONASE Place 2 sprays into both nostrils daily for 14 days.    guaiFENesin 600 MG 12 hr tablet Commonly known as: MUCINEX Take 1 tablet (600 mg total) by mouth 2 (two) times daily for 5 days.   insulin aspart 100 UNIT/ML injection Commonly known as: novoLOG Inject 0-9 Units into the skin 3 (three) times daily with meals.   insulin aspart 100 UNIT/ML injection Commonly known as: novoLOG Inject 10 Units into the skin 3 (three) times daily after meals.   insulin glargine-yfgn 100 UNIT/ML injection Commonly known as: SEMGLEE Inject 0.2 mLs (20 Units total) into the skin daily.   losartan 25 MG tablet Commonly known as: COZAAR Take 1 tablet (25 mg total) by mouth daily. What changed:  medication strength how much to take   nitroGLYCERIN 0.4 MG SL tablet Commonly known as: NITROSTAT Place 1 tablet (0.4 mg total) under the tongue every 5 (five) minutes as needed for chest pain.   polyethylene glycol 17 g packet Commonly known as: MIRALAX / GLYCOLAX Take 17 g by mouth daily as needed for mild constipation.   umeclidinium bromide 62.5 MCG/ACT Aepb Commonly known as: INCRUSE ELLIPTA Inhale 1 puff into the lungs daily.   Ventolin HFA 108 (90 Base) MCG/ACT inhaler Generic drug: albuterol Inhale 1-2 puffs into the lungs every 6 (six) hours as needed. What changed: reasons to take this        Contact information for after-discharge care     Destination     [facility] Preferred SNF .   Service: Skilled Nursing Contact information: [address] [place] [address] [phone]                     Subjective   Pt reports feeling much improved since admission.  She continues to have some sinus congestion and drainage but denies cough.  She would appreciate some decongestants.  All questions and concerns were addressed at time of discharge.  Objective  Blood pressure (!) 141/74, pulse 81, temperature 98.8 F (37.1 C), resp. rate 16, height 5\' 2"  (1.575 m), weight 45.8 kg, last menstrual period  11/24/2014, SpO2 93%.   General: Pt is alert, awake, not in acute distress Cardiovascular: RRR, S1/S2 +, no rubs, no gallops Respiratory: CTA bilaterally, no wheezing, no rhonchi Abdominal: Soft, NT, ND, bowel sounds + Extremities: no edema, no cyanosis  The results of significant diagnostics from this hospitalization (including imaging, microbiology, ancillary and laboratory) are listed below for reference.   Imaging studies: DG Chest Port 1 View Result Date: 07/14/2023 CLINICAL DATA:  Follow-up pneumothorax EXAM: PORTABLE CHEST 1 VIEW COMPARISON:  07/13/2023 FINDINGS: Left-sided pigtail catheter has been removed in the interval. Slight increase in subcutaneous emphysema is noted. No discrete pneumothorax is noted. Patient is rotated to the right accentuating the mediastinal markings. Persistent right basilar airspace opacity is noted. No other focal abnormality is seen. IMPRESSION: Recurrent pneumothorax following chest tube removal. Persistent right basilar airspace opacity. Electronically Signed   By: Alcide Clever M.D.   On: 07/14/2023 09:27   DG Chest 1 View  Result Date: 07/13/2023 CLINICAL DATA:  Pneumothorax. EXAM: CHEST  1 VIEW COMPARISON:  Radiograph earlier today FINDINGS: Left basilar pigtail catheter slightly uncoiled in the interim. No visible pneumothorax. Subcutaneous emphysema in the left chest wall is similar. There is increasing patchy opacity at the right lung base. There may be a small right pleural effusion. Stable heart size and mediastinal contours. IMPRESSION: 1. Left basilar pigtail catheter slightly uncoiled in the interim. No visible pneumothorax. 2. Stable left chest wall subcutaneous emphysema. 3. Increasing patchy opacity at the right lung base. Possible small right pleural effusion. Electronically Signed   By: Narda Rutherford M.D.   On: 07/13/2023 18:14   DG Chest 1 View Result Date: 07/13/2023 CLINICAL DATA:  Follow-up pneumothorax EXAM: PORTABLE CHEST 1 VIEW  COMPARISON:  07/12/2023 FINDINGS: Cardiac shadows within normal limits. Lungs are well aerated bilaterally. Pigtail catheter is again noted on the left. No pneumothorax is seen. Small pleural effusion remains. Minimal right-sided effusion is seen. Extensive subcutaneous emphysema is again noted. IMPRESSION: Resolution of previously seen left-sided pneumothorax. Small bilateral effusions. Electronically Signed   By: Alcide Clever M.D.   On: 07/13/2023 10:13   DG CHEST PORT 1 VIEW Result Date: 07/12/2023 CLINICAL DATA:  58 year old female with history of pneumothorax. EXAM: PORTABLE CHEST 1 VIEW COMPARISON:  Chest x-ray 07/11/2023. FINDINGS: Small bore left-sided chest tube with tip projecting over the lateral aspect of the lower left hemithorax. Persistent small left-sided pneumothorax with extensive gas in the subcutaneous soft tissues of the left chest wall tracking cephalad into the lower left cervical region. Patchy ill-defined opacities in the lungs bilaterally, most evident in the lung bases. Small bilateral pleural effusions. No right pneumothorax. No evidence of pulmonary edema. Heart size is normal. The patient is rotated to the right on today's exam, resulting in distortion of the mediastinal contours and reduced diagnostic sensitivity and specificity for mediastinal pathology. Atherosclerotic calcifications are noted in the thoracic aorta. IMPRESSION: 1. Persistent small left-sided hydropneumothorax with extensive subcutaneous emphysema in the left chest wall tracking cephalad into the lower left cervical region. 2. Patchy ill-defined opacities in the lungs bilaterally, concerning for multifocal pneumonia, most confluent in the right base. 3. Small right pleural effusion. 4. Aortic atherosclerosis. Electronically Signed   By: Trudie Reed M.D.   On: 07/12/2023 05:58   DG CHEST PORT 1 VIEW Result Date: 07/11/2023 CLINICAL DATA:  Chest tube in place. EXAM: PORTABLE CHEST 1 VIEW COMPARISON:  Chest  radiograph dated 07/11/2023. FINDINGS: Left-sided chest tube in similar position. No detectable pneumothorax. Bibasilar densities improved since the prior radiograph. Stable cardiac silhouette. Atherosclerotic calcification of the aorta. No acute osseous pathology. Left chest wall soft tissue emphysema. IMPRESSION: 1. Left-sided chest tube in similar position. No detectable pneumothorax. 2. Improved bibasilar densities. Electronically Signed   By: Elgie Collard M.D.   On: 07/11/2023 14:12   DG Chest 1 View Result Date: 07/11/2023 CLINICAL DATA:  Follow-up left pneumothorax. EXAM: CHEST  1 VIEW COMPARISON:  07/10/2023 FINDINGS: The heart size and mediastinal contours are within normal limits. Left pleural pigtail catheter remains in place. Subcutaneous emphysema again seen in the left chest wall, however, no pneumothorax identified. Mild bibasilar airspace opacity and small left pleural effusion show no significant change. IMPRESSION: Left chest wall subcutaneous emphysema, without definite pneumothorax. Left pleural pigtail catheter remains in place. Stable mild bibasilar airspace opacity and small left pleural effusion. Electronically Signed   By: Danae Orleans M.D.   On: 07/11/2023 09:54   DG CHEST PORT  1 VIEW Result Date: 07/10/2023 CLINICAL DATA:  Pneumothorax follow-up EXAM: PORTABLE CHEST - 1 VIEW COMPARISON:  07/09/2023 FINDINGS: Cardiomediastinal silhouette and pulmonary vasculature are within normal limits. Left basilar chest tube is unchanged in position. No residual pneumothorax is seen. Mild subcutaneous emphysema of the left axilla and chest wall similar to prior exam. Minimal right pleural effusion is unchanged. Mild bibasilar atelectasis again noted. IMPRESSION: No residual left pneumothorax. Left basilar chest tube unchanged in position. Electronically Signed   By: Acquanetta Belling M.D.   On: 07/10/2023 09:34   DG CHEST PORT 1 VIEW Result Date: 07/09/2023 CLINICAL DATA:  Chest tube EXAM: PORTABLE  CHEST 1 VIEW COMPARISON:  07/09/2023 FINDINGS: Interval placement of left chest tube. Re-expansion of the left lung. No visible pneumothorax. Subcutaneous emphysema within the left chest wall. Heart and mediastinal contours within normal limits. Bilateral airspace opacities similar to prior study. IMPRESSION: Interval placement of left chest tube with no visible pneumothorax. Patchy bilateral airspace disease. Electronically Signed   By: Charlett Nose M.D.   On: 07/09/2023 21:07   DG CHEST PORT 1 VIEW Addendum Date: 07/09/2023 ADDENDUM REPORT: 07/09/2023 19:19 ADDENDUM: These results were called by telephone at the time of interpretation on 07/09/2023 at 4:01 pm to provider CHI ELLISON , who verbally acknowledged these results. Electronically Signed   By: Darliss Cheney M.D.   On: 07/09/2023 19:19   Result Date: 07/09/2023 CLINICAL DATA:  Status post bronchoscopy EXAM: PORTABLE CHEST 1 VIEW COMPARISON:  Chest x-ray 07/09/2023.  Chest CT 07/07/2023. FINDINGS: There is a new moderate-sized left pneumothorax. Patient is rotated. No definite mediastinal shift. There are patchy opacities in the left lower lung and left mid lung likely related to nodular density seen on CT and atelectasis. No pleural effusion. Cardiomediastinal silhouette appears within normal limits. No acute fractures. IMPRESSION: 1. New moderate-sized left pneumothorax. No definite mediastinal shift. 2. Patchy opacities in the left lower lung and left mid lung likely related to nodular density seen on CT and atelectasis. Electronically Signed: By: Darliss Cheney M.D. On: 07/09/2023 16:57   DG CHEST PORT 1 VIEW Result Date: 07/09/2023 CLINICAL DATA:  Status post bronchoscopy EXAM: PORTABLE CHEST 1 VIEW COMPARISON:  07/06/2023 FINDINGS: Cardiac shadow is stable. Aortic calcifications are seen. Lungs are well aerated bilaterally. Some slight increase in airspace opacity is noted surrounding the rounded area of density seen on the prior exam. This may  represent some post bronchoscopy hemorrhage. Additionally a small apical and lateral left-sided pneumothorax is noted. Less than 1 cm excursion is noted. IMPRESSION: Small left pneumothorax following bronchoscopy. Slight increased density is noted in the left mid lung which may be related to post biopsy hemorrhage. These results will be called to the ordering clinician or representative by the Radiologist Assistant, and communication documented in the PACS or [employer]. Electronically Signed   By: Alcide Clever M.D.   On: 07/09/2023 15:26   DG C-ARM BRONCHOSCOPY Result Date: 07/09/2023 C-ARM BRONCHOSCOPY: Fluoroscopy was utilized by the requesting physician.  No radiographic interpretation.   ECHOCARDIOGRAM COMPLETE Result Date: 07/08/2023    ECHOCARDIOGRAM REPORT   Patient Name:   Courtney Bennett Date of Exam: 07/08/2023 Medical Rec #:  [id number]       Height:       63.0 in Accession #:    [id number]      Weight:       86.7 lb Date of Birth:  Jun 17, 1965       BSA:  1.355 m Patient Age:    57 years        BP:           121/81 mmHg Patient Gender: F               HR:           80 bpm. Exam Location:  Inpatient Procedure: 2D Echo, Cardiac Doppler and Color Doppler Indications:    Abnormal ECG  History:        Patient has prior history of Echocardiogram examinations, most                 recent 12/29/2022. Signs/Symptoms:Shortness of Breath and                 Dyspnea; Risk Factors:Hypertension, Diabetes and Former Smoker.  Sonographer:    Dondra Prader RVT RCS Referring Phys: [phone] A CALDWELL POWELL JR IMPRESSIONS  1. Left ventricular ejection fraction, by estimation, is 60 to 65%. The left ventricle has normal function. The left ventricle has no regional wall motion abnormalities. Left ventricular diastolic parameters are consistent with Grade I diastolic dysfunction (impaired relaxation).  2. Right ventricular systolic function is normal. The right ventricular size is normal. There is normal pulmonary  artery systolic pressure.  3. There is no evidence of cardiac tamponade.  4. The mitral valve is normal in structure. Trivial mitral valve regurgitation. No evidence of mitral stenosis.  5. The aortic valve is normal in structure. Aortic valve regurgitation is not visualized. No aortic stenosis is present.  6. The inferior vena cava is normal in size with greater than 50% respiratory variability, suggesting right atrial pressure of 3 mmHg. FINDINGS  Left Ventricle: Left ventricular ejection fraction, by estimation, is 60 to 65%. The left ventricle has normal function. The left ventricle has no regional wall motion abnormalities. The left ventricular internal cavity size was normal in size. There is  no left ventricular hypertrophy. Left ventricular diastolic parameters are consistent with Grade I diastolic dysfunction (impaired relaxation). Right Ventricle: The right ventricular size is normal. No increase in right ventricular wall thickness. Right ventricular systolic function is normal. There is normal pulmonary artery systolic pressure. The tricuspid regurgitant velocity is 1.98 m/s, and  with an assumed right atrial pressure of 3 mmHg, the estimated right ventricular systolic pressure is 18.7 mmHg. Left Atrium: Left atrial size was normal in size. Right Atrium: Right atrial size was normal in size. Pericardium: There is no evidence of pericardial effusion. The pericardial effusion is anterior to the right ventricle. There is no evidence of cardiac tamponade. Mitral Valve: The mitral valve is normal in structure. Trivial mitral valve regurgitation. No evidence of mitral valve stenosis. Tricuspid Valve: The tricuspid valve is normal in structure. Tricuspid valve regurgitation is trivial. No evidence of tricuspid stenosis. Aortic Valve: The aortic valve is normal in structure. Aortic valve regurgitation is not visualized. No aortic stenosis is present. Aortic valve mean gradient measures 3.0 mmHg. Aortic valve peak  gradient measures 5.7 mmHg. Aortic valve area, by VTI measures 2.07 cm. Pulmonic Valve: The pulmonic valve was normal in structure. Pulmonic valve regurgitation is not visualized. No evidence of pulmonic stenosis. Aorta: The aortic root is normal in size and structure. Venous: The inferior vena cava is normal in size with greater than 50% respiratory variability, suggesting right atrial pressure of 3 mmHg. IAS/Shunts: No atrial level shunt detected by color flow Doppler.  LEFT VENTRICLE PLAX 2D LVIDd:  3.70 cm   Diastology LVIDs:         2.40 cm   LV e' medial:    7.18 cm/s LV PW:         1.10 cm   LV E/e' medial:  10.4 LV IVS:        0.80 cm   LV e' lateral:   7.62 cm/s LVOT diam:     1.80 cm   LV E/e' lateral: 9.8 LV SV:         49 LV SV Index:   36 LVOT Area:     2.54 cm  RIGHT VENTRICLE          IVC RV Basal diam:  2.70 cm  IVC diam: 1.90 cm RV Mid diam:    2.20 cm LEFT ATRIUM             Index        RIGHT ATRIUM          Index LA diam:        3.20 cm 2.36 cm/m   RA Area:     9.63 cm LA Vol (A2C):   39.7 ml 29.29 ml/m  RA Volume:   22.60 ml 16.67 ml/m LA Vol (A4C):   34.0 ml 25.08 ml/m LA Biplane Vol: 41.9 ml 30.91 ml/m  AORTIC VALVE                    PULMONIC VALVE AV Area (Vmax):    2.03 cm     PV Vmax:       0.70 m/s AV Area (Vmean):   2.01 cm     PV Peak grad:  1.9 mmHg AV Area (VTI):     2.07 cm AV Vmax:           119.50 cm/s AV Vmean:          82.400 cm/s AV VTI:            0.238 m AV Peak Grad:      5.7 mmHg AV Mean Grad:      3.0 mmHg LVOT Vmax:         95.50 cm/s LVOT Vmean:        65.100 cm/s LVOT VTI:          0.193 m LVOT/AV VTI ratio: 0.81  AORTA Ao Root diam: 3.20 cm MITRAL VALVE               TRICUSPID VALVE MV Area (PHT): 3.68 cm    TR Peak grad:   15.7 mmHg MV Decel Time: 206 msec    TR Vmax:        198.00 cm/s MV E velocity: 74.60 cm/s MV A velocity: 75.40 cm/s  SHUNTS MV E/A ratio:  0.99        Systemic VTI:  0.19 m                            Systemic Diam: 1.80 cm Kardie  Tobb DO Electronically signed by Thomasene Ripple DO Signature Date/Time: 07/08/2023/12:15:13 PM    Final    CT CHEST W CONTRAST Result Date: 07/07/2023 CLINICAL DATA:  Respiratory illness. EXAM: CT CHEST WITH CONTRAST TECHNIQUE: Multidetector CT imaging of the chest was performed during intravenous contrast administration. RADIATION DOSE REDUCTION: This exam was performed according to the departmental dose-optimization program which includes automated exposure control, adjustment of the mA and/or kV according to patient size and/or  use of iterative reconstruction technique. CONTRAST:  75mL OMNIPAQUE IOHEXOL 300 MG/ML  SOLN COMPARISON:  Chest radiograph dated 07/06/2023 and CT dated 11/29/2022. FINDINGS: Cardiovascular: There is no cardiomegaly. Small pericardial effusion. There is 2 vessel coronary vascular calcification. Mild atherosclerotic calcification of the thoracic aorta. No aneurysmal dilatation or dissection. The origins of the great vessels of the aortic arch appear patent. No pulmonary artery embolus identified. Mediastinum/Nodes: Mildly enlarged bilateral hilar and mediastinal lymph nodes measures 12 mm short axis in the right hilum. A cluster of lymph nodes anterior to the carina measures 12 mm short axis. The esophagus is grossly unremarkable. There is diffuse mediastinal edema. No fluid collection. Multiple thyroid nodules as seen previously. This has been evaluated on previous imaging. (ref: J Am Coll Radiol. 2015 Feb;12(2): 143-50). Lungs/Pleura: Diffuse clusters of nodular densities. Several upper lobe predominant cavitary nodules noted. Areas of consolidation in the lingula and right lung base. There is no pleural effusion or pneumothorax. The central airways are patent. Upper Abdomen: No acute abnormality. Musculoskeletal: There is diffuse subcutaneous edema and anasarca. No acute osseous pathology. IMPRESSION: 1. Multifocal pneumonia, likely atypical in etiology and may represent fungal infection,  TB, or septic emboli. Clinical correlation is recommended. 2. Mildly enlarged bilateral hilar and mediastinal lymph nodes, likely reactive. 3. Diffuse subcutaneous edema and anasarca. 4.  Aortic Atherosclerosis (ICD10-I70.0). Electronically Signed   By: Elgie Collard M.D.   On: 07/07/2023 15:39   DG Chest Port 1 View Result Date: 07/06/2023 CLINICAL DATA:  Question of sepsis to evaluate for abnormality. Weakness. Flu-like symptoms for 1 week. Decreased oxygenation. Nausea and diarrhea. Former smoker. EXAM: PORTABLE CHEST 1 VIEW COMPARISON:  03/14/2023 FINDINGS: Heart size and pulmonary vascularity are normal. New area of rounded masslike consolidation in the left mid lung measuring 3.6 cm diameter. This could represent a pulmonary mass or focal pneumonia. CT suggested for further evaluation. Right lung is clear. No pleural effusions. No pneumothorax. Mediastinal contours appear intact. IMPRESSION: Mass or focal consolidation in the left mid lung. CT suggested for further evaluation. Electronically Signed   By: Burman Nieves M.D.   On: 07/06/2023 17:04    Labs: Basic Metabolic Panel: No results for input(s): "NA", "K", "CL", "CO2", "GLUCOSE", "BUN", "CREATININE", "CALCIUM", "MG", "PHOS" in the last 168 hours. CBC: No results for input(s): "WBC", "NEUTROABS", "HGB", "HCT", "MCV", "PLT" in the last 168 hours. Microbiology: Results for orders placed or performed during the hospital encounter of 07/06/23  Blood Culture (routine x 2)     Status: None   Collection Time: 07/06/23  4:06 PM   Specimen: BLOOD RIGHT ARM  Result Value Ref Range Status   Specimen Description   Final    BLOOD RIGHT ARM Performed at [facility], [address], [place], [place] [address]    Special Requests   Final    BOTTLES DRAWN AEROBIC AND ANAEROBIC Blood Culture results may not be optimal due to an inadequate volume of blood received in culture bottles Performed at [facility], [address] [place], [place] [address]    Culture   Final    NO GROWTH 5 DAYS Performed at [facility], [address] [place], [place] [address]    Report Status 07/11/2023 FINAL  Final  Blood Culture (routine x 2)     Status: None   Collection Time: 07/06/23  4:09 PM   Specimen: BLOOD LEFT ARM  Result Value Ref Range Status   Specimen Description  Final    BLOOD LEFT ARM Performed at [facility], [address] [place], [place] [address]    Special Requests   Final    BOTTLES DRAWN AEROBIC AND ANAEROBIC Blood Culture results may not be optimal due to an inadequate volume of blood received in culture bottles Performed at [facility], [address] [address] [place], [place] [address]    Culture   Final    NO GROWTH 5 DAYS Performed at [facility], [address], [place], [place] [address]    Report Status 07/11/2023 FINAL  Final  Resp panel by RT-PCR (RSV, Flu A&B, Covid) Anterior Nasal Swab     Status: None   Collection Time: 07/06/23 10:52 PM   Specimen: Anterior Nasal Swab  Result Value Ref Range Status   SARS Coronavirus 2 by RT PCR NEGATIVE NEGATIVE Final    Comment: (NOTE) SARS-CoV-2 target nucleic acids are NOT DETECTED.  The SARS-CoV-2 RNA is generally detectable in upper respiratory specimens during the acute phase of infection. The lowest concentration of SARS-CoV-2 viral copies this assay can detect is 138 copies/mL. A negative result does not preclude SARS-Cov-2 infection and should not be used as the sole basis for treatment or other patient management decisions. A negative result may occur with  improper specimen collection/handling, submission of specimen other than nasopharyngeal swab, presence of viral mutation(s) within the areas targeted by this assay, and inadequate number of viral copies(<138 copies/mL). A negative result must be combined with clinical observations, patient history, and epidemiological information. The  expected result is Negative.  Fact Sheet for Patients:  BloggerCourse.com  Fact Sheet for Healthcare Providers:  SeriousBroker.it  This test is no t yet approved or cleared by the [place] FDA and  has been authorized for detection and/or diagnosis of SARS-CoV-2 by FDA under an Emergency Use Authorization (EUA). This EUA will remain  in effect (meaning this test can be used) for the duration of the COVID-19 declaration under Section 564(b)(1) of the Act, 21 U.S.C.section 360bbb-3(b)(1), unless the authorization is terminated  or revoked sooner.       Influenza A by PCR NEGATIVE NEGATIVE Final   Influenza B by PCR NEGATIVE NEGATIVE Final    Comment: (NOTE) The Xpert Xpress SARS-CoV-2/FLU/RSV plus assay is intended as an aid in the diagnosis of influenza from Nasopharyngeal swab specimens and should not be used as a sole basis for treatment. Nasal washings and aspirates are unacceptable for Xpert Xpress SARS-CoV-2/FLU/RSV testing.  Fact Sheet for Patients: BloggerCourse.com  Fact Sheet for Healthcare Providers: SeriousBroker.it  This test is not yet approved or cleared by the [place] FDA and has been authorized for detection and/or diagnosis of SARS-CoV-2 by FDA under an Emergency Use Authorization (EUA). This EUA will remain in effect (meaning this test can be used) for the duration of the COVID-19 declaration under Section 564(b)(1) of the Act, 21 U.S.C. section 360bbb-3(b)(1), unless the authorization is terminated or revoked.     Resp Syncytial Virus by PCR NEGATIVE NEGATIVE Final    Comment: (NOTE) Fact Sheet for Patients: BloggerCourse.com  Fact Sheet for Healthcare Providers: SeriousBroker.it  This test is not yet approved or cleared by the [place] FDA and has been authorized for detection and/or  diagnosis of SARS-CoV-2 by FDA under an Emergency Use Authorization (EUA). This EUA will remain in effect (meaning this test can be used) for the duration of the COVID-19 declaration under Section 564(b)(1) of the Act, 21 U.S.C. section  360bbb-3(b)(1), unless the authorization is terminated or revoked.  Performed at [facility], [address] [place], [place] [address]   MRSA Next Gen by PCR, Nasal     Status: None   Collection Time: 07/07/23 11:25 AM  Result Value Ref Range Status   MRSA by PCR Next Gen NOT DETECTED NOT DETECTED Final    Comment: (NOTE) The GeneXpert MRSA Assay (FDA approved for NASAL specimens only), is one component of a comprehensive MRSA colonization surveillance program. It is not intended to diagnose MRSA infection nor to guide or monitor treatment for MRSA infections. Test performance is not FDA approved in patients less than 96 years old. Performed at [facility], [address], [place], [place] [address]   Respiratory (~20 pathogens) panel by PCR     Status: None   Collection Time: 07/08/23  7:37 AM   Specimen: Nasopharyngeal Swab; Respiratory  Result Value Ref Range Status   Adenovirus NOT DETECTED NOT DETECTED Final   Coronavirus 229E NOT DETECTED NOT DETECTED Final    Comment: (NOTE) The Coronavirus on the Respiratory Panel, DOES NOT test for the novel  Coronavirus (2019 nCoV)    Coronavirus HKU1 NOT DETECTED NOT DETECTED Final   Coronavirus NL63 NOT DETECTED NOT DETECTED Final   Coronavirus OC43 NOT DETECTED NOT DETECTED Final   Metapneumovirus NOT DETECTED NOT DETECTED Final   Rhinovirus / Enterovirus NOT DETECTED NOT DETECTED Final   Influenza A NOT DETECTED NOT DETECTED Final   Influenza B NOT DETECTED NOT DETECTED Final   Parainfluenza Virus 1 NOT DETECTED NOT DETECTED Final   Parainfluenza Virus 2 NOT DETECTED NOT DETECTED Final   Parainfluenza Virus 3 NOT DETECTED NOT DETECTED Final    Parainfluenza Virus 4 NOT DETECTED NOT DETECTED Final   Respiratory Syncytial Virus NOT DETECTED NOT DETECTED Final   Bordetella pertussis NOT DETECTED NOT DETECTED Final   Bordetella Parapertussis NOT DETECTED NOT DETECTED Final   Chlamydophila pneumoniae NOT DETECTED NOT DETECTED Final   Mycoplasma pneumoniae NOT DETECTED NOT DETECTED Final    Comment: Performed at [facility], [address], [place], [place] [address]  Acid Fast Smear (AFB)     Status: None   Collection Time: 07/08/23  9:00 AM   Specimen: Sputum  Result Value Ref Range Status   AFB Specimen Processing Concentration  Final   Acid Fast Smear Negative  Final    Comment: (NOTE) Performed At: [facility] [address] [place], [place] [id number] Jolene Schimke MD OZ:[id number]    Source (AFB) SPUTUM  Final    Comment: Performed at [facility], [address], [place], [place] [address]  Acid Fast Smear (AFB)     Status: None   Collection Time: 07/09/23  8:16 AM   Specimen: Sputum  Result Value Ref Range Status   AFB Specimen Processing Concentration  Final   Acid Fast Smear Negative  Final    Comment: (NOTE) Performed At: [facility] [address] [place], [place] [id number] Jolene Schimke MD UU:[id number]    Source (AFB) SPUTUM  Final    Comment: Performed at [facility], [address], [place], [place] [address]  Fungus Culture With Stain     Status: None (Preliminary result)   Collection Time: 07/09/23  1:55 PM   Specimen: Bronchial Alveolar Lavage; Respiratory  Result Value Ref Range Status   Fungus Stain Final report  Final    Comment: (NOTE) Performed At: [facility]  [place] [address] [place], [place] [id number] Jolene Schimke MD NW:[id number]    Fungus (Mycology) Culture PENDING  Incomplete   Fungal Source BRONCHIAL ALVEOLAR LAVAGE  Final    Comment: SPECIMEN A Performed at [facility], [address], [place], [place] [address]   Culture, BAL-quantitative w Gram Stain     Status: None   Collection Time: 07/09/23  1:55 PM   Specimen: Bronchial Alveolar Lavage; Respiratory  Result Value Ref Range Status   Specimen Description   Final    BRONCHIAL ALVEOLAR LAVAGE SPECIMEN A Performed at [facility], [address] [place], [place] [address]    Special Requests   Final    NONE Performed at [facility], [address] [place], [place] [address]    Gram Stain   Final    RARE WBC PRESENT, PREDOMINANTLY PMN NO ORGANISMS SEEN    Culture   Final    NO GROWTH 3 DAYS Performed at [facility], [address], [place], [place] [address]    Report Status 07/13/2023 FINAL  Final  Acid Fast Smear (AFB)     Status: None   Collection Time: 07/09/23  1:55 PM   Specimen: Bronchial Alveolar Lavage; Respiratory  Result Value Ref Range Status   AFB Specimen Processing Concentration  Final   Acid Fast Smear Negative  Final    Comment: (NOTE) Performed At: [facility] [address] [place], [place] [id number] Jolene Schimke MD QI:[id number]    Source (AFB) BRONCHIAL ALVEOLAR LAVAGE  Final    Comment: SPECIMEN A Performed at [facility], [address], [place], [place] [address]   Anaerobic culture w Gram Stain     Status: None   Collection Time: 07/09/23  1:55 PM   Specimen: Bronchoalveolar Lavage  Result Value Ref Range Status   Specimen Description BRONCHIAL ALVEOLAR LAVAGE  Final   Special Requests NONE  Final   Gram Stain   Final    RARE WBC PRESENT, PREDOMINANTLY PMN NO ORGANISMS SEEN    Culture   Final    NO ANAEROBES ISOLATED Performed at [facility], [address] [place], [place] [address]    Report Status 07/14/2023 FINAL  Final  Fungus Culture Result     Status: None   Collection Time: 07/09/23  1:55 PM  Result Value Ref Range Status   Result 1 Comment  Final    Comment:  (NOTE) KOH/Calcofluor preparation:  no fungus observed. Performed At: [facility] [address] [place], [place] [id number] Jolene Schimke MD TK:[id number]   Acid Fast Smear (AFB)     Status: None   Collection Time: 07/09/23  2:02 PM   Specimen: Bronchial Brushing, Left; Respiratory  Result Value Ref Range Status   AFB Specimen Processing Concentration  Final   Acid Fast Smear Negative  Final    Comment: (NOTE) Performed At: [facility] [address] [place], [place] [id number] Jolene Schimke MD YH:[id number]    Source (AFB) BRONCHIAL ALVEOLAR LAVAGE  Final    Comment: BRONCHIAL BRUSHING, LEFT SPECIMEN B Performed at [facility], [address] [place], [place] [address]   Acid Fast Smear (AFB)     Status: None   Collection Time: 07/09/23  2:04 PM   Specimen: Bronchial Brushing, Left; Respiratory  Result Value Ref Range Status   AFB Specimen Processing Concentration  Final   Acid Fast Smear Negative  Final  Comment: (NOTE) Performed At: [facility] [address] [place], [place] [id number] Jolene Schimke MD MW:[id number]    Source (AFB) BRONCHIAL ALVEOLAR LAVAGE  Final    Comment: BRONCHIAL BRUSHING SPECIMEN C Performed at [facility], [address], [place], [place] [address]   Aerobic/Anaerobic Culture w Gram Stain (surgical/deep wound)     Status: None   Collection Time: 07/09/23  2:06 PM   Specimen: PATH Respiratory biopsy; Tissue  Result Value Ref Range Status   Specimen Description BIOPSY  Final   Special Requests LINGULA TRANSBRONCHIAL BIOSPIES SAMPLE D  Final   Gram Stain NO WBC SEEN NO ORGANISMS SEEN   Final   Culture   Final    No growth aerobically or anaerobically. Performed at [facility], [address] [place], [place] [address]    Report Status 07/14/2023 FINAL  Final  Acid Fast Smear (AFB)     Status: None   Collection Time: 07/09/23  2:06 PM   Specimen: PATH  Respiratory biopsy; Tissue  Result Value Ref Range Status   AFB Specimen Processing Concentration  Final   Acid Fast Smear Negative  Final    Comment: (NOTE) Performed At: [facility] [address] [place], [place] [id number] Jolene Schimke MD IR:[id number]    Source (AFB) BRONCHIAL ALVEOLAR LAVAGE  Final    Comment: RESPIRATORY BIOPSY LINGULA TRANSBRONCHIAL BIOPSIES SPECIMEN D Performed at [facility], [address], [place], [place] [address]     Time coordinating discharge: Over 30 minutes  Leeroy Bock, MD  [facility] 07/21/2023, 11:24 AM

## 2023-07-21 NOTE — Progress Notes (Incomplete)
 Progress Note    Courtney Bennett   MWU:[id number]  DOB: 02-04-1966  DOA: 07/06/2023     15 PCP: Patient, No Pcp Per  Initial CC: cough, SOB  Hospital Course: [name] is a 58 year old female with PMH COPD, fibromyalgia, diastolic CHF, HTN, DM I who presented with cough and shortness of breath. Workup was consistent with pneumonia and complicated with developing a pneumothorax. She was evaluated by pulmonology and required chest tube placement. She also underwent bronchoscopy on 07/09/2023. Chest tube was removed on 07/13/2023.   Interval History:  ***  Assessment and Plan: CAP of left upper lobe of lung - Met criteria for sepsis on admission with tachycardia, leukocytosis and CXR concerning for pneumonia - Completed Rocephin and azithromycin courses - CT chest with multifocal pneumonia, atypical in etiology - s/p bronch 2/7 - Cytology -> no malignant cells, benign bronchial cells and alveolar macrophages; pathology - benign lung tissue; AFB cultures NGTD; d/c airborne precautions - Negative covid, flu, RSV; MRSA PCR negative, urine strep negative, urine legionella negative -Outpatient follow-up with pulmonology in 4 to 6 weeks  Pneumothorax S/p chest tube per pulm - removed 2/11   Acute respiratory failure with hypoxia (HCC) - continue weaning O2 as able  Bipolar affective disorder, current episode hypomanic (HCC) - recent hospitalization in January 2025 with paranoid and hypomania -Evaluated by psychiatry previously and was unwilling to take medication - She was recommended to follow-up with [facility] behavioral health at discharge  Grade I diastolic dysfunction No signs or symptoms of exacerbation - Last echo 12/29/2022: EF 60 to 65%, grade 1 diastolic dysfunction, mild LVH  Essential hypertension - Blood pressure currently controlled without medication but has been on losartan previously  DM (diabetes mellitus) (HCC) - continue SSI and CBG  - A1c 13.1% - will  allow reg diet but if glucose elevates excessively will modify again  Anasarca-resolved as of 07/16/2023 Suspect at least partially related to hypoalbuminemia Echo with EF 60-65%, no RWMA, grade 1 diastolic dysfunction Will follow UA -> negative protein on UA   Old records reviewed in assessment of this patient  Antimicrobials: Azithromycin 07/06/2023 >> 07/08/2023 Rocephin 07/06/2023 >> 07/13/2023  DVT prophylaxis:  enoxaparin (LOVENOX) injection 40 mg Start: 07/11/23 0800 SCDs Start: 07/06/23 1750   Code Status:   Code Status: Full Code  Mobility Assessment (Last 72 Hours)     Mobility Assessment     Row Name 07/20/23 2000 07/20/23 1231 07/20/23 0840 07/19/23 2000 07/19/23 1300   Does patient have an order for bedrest or is patient medically unstable No - Continue assessment -- No - Continue assessment No - Continue assessment --   What is the highest level of mobility based on the progressive mobility assessment? Level 5 (Walks with assist in room/hall) - Balance while stepping forward/back and can walk in room with assist - Complete Level 5 (Walks with assist in room/hall) - Balance while stepping forward/back and can walk in room with assist - Complete Level 5 (Walks with assist in room/hall) - Balance while stepping forward/back and can walk in room with assist - Complete Level 5 (Walks with assist in room/hall) - Balance while stepping forward/back and can walk in room with assist - Complete Level 5 (Walks with assist in room/hall) - Balance while stepping forward/back and can walk in room with assist - Complete   Is the above level different from baseline mobility prior to current illness? No - Consider discontinuing PT/OT -- -- -- --  Row Name 07/19/23 1012 07/18/23 2025 07/18/23 0948 07/18/23 0900     Does patient have an order for bedrest or is patient medically unstable No - Continue assessment No - Continue assessment No - Continue assessment No - Continue assessment    What  is the highest level of mobility based on the progressive mobility assessment? Level 6 (Walks independently in room and hall) - Balance while walking in room without assist - Complete Level 5 (Walks with assist in room/hall) - Balance while stepping forward/back and can walk in room with assist - Complete Level 5 (Walks with assist in room/hall) - Balance while stepping forward/back and can walk in room with assist - Complete Level 5 (Walks with assist in room/hall) - Balance while stepping forward/back and can walk in room with assist - Complete             Barriers to discharge: none Disposition Plan:  SNF Status is: Inpt  Objective: Blood pressure (!) 141/74, pulse 81, temperature 98.8 F (37.1 C), resp. rate 16, height 5\' 2"  (1.575 m), weight 45.8 kg, last menstrual period 11/24/2014, SpO2 93%.  Examination:  Physical Exam Constitutional:      Appearance: Normal appearance.  HENT:     Head: Normocephalic and atraumatic.     Mouth/Throat:     Mouth: Mucous membranes are moist.  Eyes:     Extraocular Movements: Extraocular movements intact.  Cardiovascular:     Rate and Rhythm: Normal rate and regular rhythm.  Pulmonary:     Effort: Pulmonary effort is normal. No respiratory distress.     Breath sounds: Normal breath sounds. No wheezing.  Abdominal:     General: Bowel sounds are normal. There is no distension.     Palpations: Abdomen is soft.     Tenderness: There is no abdominal tenderness.  Musculoskeletal:        General: Normal range of motion.     Cervical back: Normal range of motion and neck supple.  Skin:    General: Skin is warm and dry.  Neurological:     General: No focal deficit present.     Mental Status: She is alert.  Psychiatric:        Attention and Perception: Attention normal.        Mood and Affect: Mood normal. Mood is not depressed.        Behavior: Behavior is cooperative.      Consultants:  Pulm  Procedures:    Data Reviewed: Results  for orders placed or performed during the hospital encounter of 07/06/23 (from the past 24 hours)  Glucose, capillary     Status: Abnormal   Collection Time: 07/20/23  7:58 AM  Result Value Ref Range   Glucose-Capillary 193 (H) 70 - 99 mg/dL  Glucose, capillary     Status: Abnormal   Collection Time: 07/20/23 11:20 AM  Result Value Ref Range   Glucose-Capillary 207 (H) 70 - 99 mg/dL  Glucose, capillary     Status: None   Collection Time: 07/20/23  4:32 PM  Result Value Ref Range   Glucose-Capillary 88 70 - 99 mg/dL  Glucose, capillary     Status: Abnormal   Collection Time: 07/20/23  8:29 PM  Result Value Ref Range   Glucose-Capillary 117 (H) 70 - 99 mg/dL  Glucose, capillary     Status: Abnormal   Collection Time: 07/21/23  7:28 AM  Result Value Ref Range   Glucose-Capillary 180 (H) 70 - 99 mg/dL  I have reviewed pertinent nursing notes, vitals, labs, and images as necessary. I have ordered labwork to follow up on as indicated.  I have reviewed the last notes from staff over past 24 hours. I have discussed patient's care plan and test results with nursing staff, CM/SW, and other staff as appropriate.    LOS: 15 days   Leeroy Bock, MD [facility] 07/21/2023, 7:55 AM

## 2023-07-21 NOTE — Progress Notes (Signed)
 Writer attempted twice to give verbal report to receiving facility. On 2nd attempt, provided staff member contact info because she stated the assigned nurse for room 130 was giving out medication.  Advised patient was coming via POV. Staff member verbalized understanding.

## 2023-07-30 ENCOUNTER — Encounter: Payer: Self-pay | Admitting: Internal Medicine

## 2023-08-02 ENCOUNTER — Other Ambulatory Visit (HOSPITAL_COMMUNITY): Payer: Self-pay

## 2023-08-06 ENCOUNTER — Encounter (HOSPITAL_COMMUNITY): Payer: Self-pay | Admitting: Emergency Medicine

## 2023-08-06 ENCOUNTER — Ambulatory Visit (HOSPITAL_COMMUNITY)
Admission: EM | Admit: 2023-08-06 | Discharge: 2023-08-06 | Disposition: A | Discharge status: Home / Self Care | Attending: Family Medicine | Admitting: Family Medicine

## 2023-08-06 DIAGNOSIS — R195 Other fecal abnormalities: Secondary | ICD-10-CM

## 2023-08-06 DIAGNOSIS — T07XXXA Unspecified multiple injuries, initial encounter: Secondary | ICD-10-CM

## 2023-08-06 MED ORDER — CEPHALEXIN 250 MG PO CAPS: 250.0000 mg | ORAL_CAPSULE | Freq: Three times a day (TID) | ORAL | 0 refills | Status: AC

## 2023-08-06 NOTE — Discharge Instructions (Addendum)
 Take cephalexin 250 mg--1 capsule 3 times daily for 7 days  Bring your stool specimen back as soon as you are able and we will send it to the lab for testing.

## 2023-08-06 NOTE — ED Provider Notes (Signed)
 [facility]    CSN: [id number] Arrival date & time: 08/06/23  1548      History   Chief Complaint Chief Complaint  Patient presents with   Skin Problem    HPI Courtney Bennett is a 58 y.o. female.   HPI  Here for sore that had been on her head and scalp for a month or more.  She states that her prior PCP had told her to put antibiotic ointment on it.  At 1 point she states that she had often hit her head on the inside of her vehicle.  She was admitted to the hospital for pneumonia and had to have a chest tube.  She took a full course of antibiotics for that then.  She states that while she was at the hospital no new lesions developed but then she developed some more on her back and legs after she was discharged.  No drainage from the areas and no fever.  She also notes black spots like little coffee grounds or like little bugs in her stool.  The sores do not itch She is allergic to sulfa, Lamictal, and oxycodone.  Past Medical History:  Diagnosis Date   Bipolar 1 disorder (HCC)    COPD (chronic obstructive pulmonary disease) (HCC)    Diabetes mellitus    Fibromyalgia    Grade I diastolic dysfunction 06/06/2023   Hypertension     Patient Active Problem List   Diagnosis Date Noted   Pneumothorax 07/14/2023   Community acquired pneumonia of left upper lobe of lung 07/06/2023   Suicidal ideation 06/09/2023   Uncontrolled type 2 diabetes mellitus with hyperglycemia (HCC) 06/06/2023   Insect bites 06/06/2023   Grade I diastolic dysfunction 06/06/2023   Acute respiratory failure with hypoxemia (HCC) 03/15/2023   COPD with acute exacerbation (HCC) 03/14/2023   Acute respiratory failure with hypoxia (HCC) 03/14/2023   Protein-calorie malnutrition, severe 12/31/2022   Atypical chest pain 12/29/2022   Essential hypertension 12/29/2022   DM (diabetes mellitus) (HCC) 12/01/2022   Gastric distention 11/30/2022   Hav (hallux abducto valgus), unspecified  laterality 11/04/2022   COPD (chronic obstructive pulmonary disease) (HCC) 01/22/2022   Homeless 01/22/2022   Ulcer of right foot (HCC) 01/22/2022   Tobacco dependence 12/27/2019   Aspiration pneumonia (HCC) 12/10/2019   Paroxysmal atrial flutter (HCC) 12/10/2019   Aspiration into airway 12/10/2019   Fibromyalgia 02/27/2019   Hx of unilateral oophorectomy 01/31/2019   Bipolar affective disorder, current episode hypomanic (HCC) 08/10/2014   Paranoid (HCC) 08/10/2014    Past Surgical History:  Procedure Laterality Date   ABDOMINAL HYSTERECTOMY  01/2019   BREAST BIOPSY Right    BREAST BIOPSY Right 02/18/2023   [place] RT BREAST BX W LOC DEV 1ST LESION IMG BX SPEC US GUIDE 02/18/2023 [facility]   BRONCHIAL BIOPSY  07/09/2023   Procedure: BRONCHIAL BIOPSIES;  Surgeon: Luciano Cutter, MD;  Location: [facility];  Service: Cardiopulmonary;;   BRONCHIAL BRUSHINGS  07/09/2023   Procedure: BRONCHIAL BRUSHINGS;  Surgeon: Luciano Cutter, MD;  Location: [facility];  Service: Cardiopulmonary;;   BRONCHIAL WASHINGS  07/09/2023   Procedure: BRONCHIAL WASHINGS;  Surgeon: Luciano Cutter, MD;  Location: [facility];  Service: Cardiopulmonary;;   CESAREAN SECTION     HEMOSTASIS CONTROL  07/09/2023   Procedure: HEMOSTASIS CONTROL;  Surgeon: Luciano Cutter, MD;  Location: [facility];  Service: Cardiopulmonary;;   HERNIA REPAIR     VIDEO BRONCHOSCOPY N/A 07/09/2023   Procedure: VIDEO BRONCHOSCOPY WITH  FLUORO;  Surgeon: Luciano Cutter, MD;  Location: [facility];  Service: Cardiopulmonary;  Laterality: N/A;    OB History   No obstetric history on file.      Home Medications    Prior to Admission medications   Medication Sig Start Date End Date Taking? Authorizing Provider  cephALEXin (KEFLEX) 250 MG capsule Take 1 capsule (250 mg total) by mouth 3 (three) times daily for 7 days. 08/06/23 08/13/23 Yes Zenia Resides, MD  LANTUS SOLOSTAR 100 UNIT/ML Solostar Pen Inject into the skin.  08/04/23  Yes [provider]  mupirocin ointment (BACTROBAN) 2 % Apply topically. 07/27/23  Yes [provider]  albuterol (VENTOLIN HFA) 108 (90 Base) MCG/ACT inhaler Inhale 1-2 puffs into the lungs every 6 (six) hours as needed. Patient taking differently: Inhale 1-2 puffs into the lungs every 6 (six) hours as needed for wheezing or shortness of breath. 06/09/23   Pokhrel, Rebekah Chesterfield, MD  alum & mag hydroxide-simeth (MAALOX/MYLANTA) 200-200-20 MG/5ML suspension Take 30 mLs by mouth every 4 (four) hours as needed for indigestion, heartburn or flatulence. 07/21/23   Leeroy Bock, MD  amLODipine (NORVASC) 10 MG tablet Take 1 tablet (10 mg total) by mouth daily. Patient not taking: Reported on 07/07/2023 06/09/23   Pokhrel, Rebekah Chesterfield, MD  fluticasone (FLONASE) 50 MCG/ACT nasal spray Place 2 sprays into both nostrils daily for 14 days. 07/21/23 08/04/23  Leeroy Bock, MD  insulin aspart (NOVOLOG) 100 UNIT/ML injection Inject 0-9 Units into the skin 3 (three) times daily with meals. 07/21/23   Leeroy Bock, MD  insulin aspart (NOVOLOG) 100 UNIT/ML injection Inject 10 Units into the skin 3 (three) times daily after meals. 07/21/23   Leeroy Bock, MD  insulin glargine-yfgn (SEMGLEE) 100 UNIT/ML injection Inject 0.2 mLs (20 Units total) into the skin daily. 07/21/23   Leeroy Bock, MD  losartan (COZAAR) 25 MG tablet Take 1 tablet (25 mg total) by mouth daily. 07/21/23   Leeroy Bock, MD  nitroGLYCERIN (NITROSTAT) 0.4 MG SL tablet Place 1 tablet (0.4 mg total) under the tongue every 5 (five) minutes as needed for chest pain. 06/09/23   Pokhrel, Rebekah Chesterfield, MD  polyethylene glycol (MIRALAX / GLYCOLAX) 17 g packet Take 17 g by mouth daily as needed for mild constipation. 07/21/23   Leeroy Bock, MD  Throat Lozenges (COUGH DROPS MENTHOL) LOZG Use as directed 1 lozenge in the mouth or throat every 2 (two) hours as needed (for a sore throat).    [provider]   umeclidinium bromide (INCRUSE ELLIPTA) 62.5 MCG/ACT AEPB Inhale 1 puff into the lungs daily. 03/22/23   Burnadette Pop, MD    Family History Family History  Problem Relation Age of Onset   Diabetes Father    Breast cancer Mother    Breast cancer Paternal Grandmother     Social History Social History   Tobacco Use   Smoking status: Former    Current packs/day: 0.00    Types: Cigarettes    Quit date: 11/21/2014    Years since quitting: 8.7   Smokeless tobacco: Never  Substance Use Topics   Alcohol use: Yes    Comment: rare   Drug use: No     Allergies   Nickel, Oxycodone-acetaminophen, Metformin, Other, Elemental sulfur, Lamictal [lamotrigine], Latex, Sulfa antibiotics, and Tape   Review of Systems Review of Systems   Physical Exam Triage Vital Signs ED Triage Vitals  Encounter Vitals Group     BP 08/06/23 1615  138/74     Systolic BP Percentile --      Diastolic BP Percentile --      Pulse --      Resp 08/06/23 1615 15     Temp 08/06/23 1615 98.2 F (36.8 C)     Temp Source 08/06/23 1615 Oral     SpO2 08/06/23 1615 93 %     Weight --      Height --      Head Circumference --      Peak Flow --      Pain Score 08/06/23 1613 0     Pain Loc --      Pain Education --      Exclude from Growth Chart --    No data found.  Updated Vital Signs BP 138/74 (BP Location: Left Arm)   Temp 98.2 F (36.8 C) (Oral)   Resp 15   LMP 11/24/2014   SpO2 93%   Visual Acuity Right Eye Distance:   Left Eye Distance:   Bilateral Distance:    Right Eye Near:   Left Eye Near:    Bilateral Near:     Physical Exam Vitals reviewed.  Constitutional:      General: She is not in acute distress.    Appearance: She is not toxic-appearing or diaphoretic.     Comments: Chronically ill appearing and very thin.  Very pleasant and calm  HENT:     Mouth/Throat:     Mouth: Mucous membranes are moist.  Eyes:     Extraocular Movements: Extraocular movements intact.      Conjunctiva/sclera: Conjunctivae normal.     Pupils: Pupils are equal, round, and reactive to light.     Comments: The skin of her eyelids is flaky.  There is mild erythema of her upper and lower eyelids, but no swelling  Cardiovascular:     Rate and Rhythm: Normal rate and regular rhythm.     Heart sounds: No murmur heard. Pulmonary:     Effort: Pulmonary effort is normal.     Breath sounds: Normal breath sounds. No stridor. No wheezing.  Skin:    Coloration: Skin is not pale.     Comments: On the vertex of her scalp there are some large lesions that appear to be shallow abrasions.  Largest 1 is about 2.5 cm in diameter but most of them are about a centimeter or 2.  There are similar areas on her upper and lower back.  Those areas are anywhere from 1 cm in diameter to 3 x 1.5 cm in diameter.  None of these areas have a raised or indurated edge.  There is no erythema surrounding the and there is no purulent drainage.  The wound beds look healthy and viable.  Neurological:     Mental Status: She is alert and oriented to person, place, and time.  Psychiatric:        Behavior: Behavior normal.      UC Treatments / Results  Labs (all labs ordered are listed, but only abnormal results are displayed) Labs Reviewed - No data to display  EKG   Radiology No results found.  Procedures Procedures (including critical care time)  Medications Ordered in UC Medications - No data to display  Initial Impression / Assessment and Plan / UC Course  I have reviewed the triage vital signs and the nursing notes.  Pertinent labs & imaging results that were available during my care of the patient were reviewed by me  and considered in my medical decision making (see chart for details).     She is very concerned that some of these lesions or the abnormal stool indicates she has a parasite.  Stool collection supplies are given here and O&P is ordered for the stool.  We will notify her if anything  shows up on that specimen when tested.  I am going to do a prescription for just 5 days for Keflex.  Staff will assist her in making a primary care appointment.  She is also getting scheduled for the mobile clinic.  She is also given contact information for dermatology. Final Clinical Impressions(s) / UC Diagnoses   Final diagnoses:  Multiple abrasions  Abnormal stools     Discharge Instructions      Take cephalexin 250 mg--1 capsule 3 times daily for 7 days  Bring your stool specimen back as soon as you are able and we will send it to the lab for testing.       ED Prescriptions     Medication Sig Dispense Auth. Provider   cephALEXin (KEFLEX) 250 MG capsule Take 1 capsule (250 mg total) by mouth 3 (three) times daily for 7 days. 21 capsule Zenia Resides, MD      PDMP not reviewed this encounter.   Zenia Resides, MD 08/06/23 [phone]

## 2023-08-06 NOTE — ED Triage Notes (Signed)
 Pt reports was admitted to hospital, reports while there got sores all over her. Pt wanting to know what spots are. Reports had ones in her hair before and told was fungal.   Reports redness under both eyes since got out of hospital around 2/20.  Pt reports "looks like little coffee grounds in my poo poo". Believes that started in hospital as well.

## 2023-08-07 ENCOUNTER — Telehealth (HOSPITAL_COMMUNITY): Payer: Self-pay | Admitting: Emergency Medicine

## 2023-08-07 NOTE — Telephone Encounter (Signed)
 Patient seen yesterday by Dr. Marlinda Mike, per her note was supposed to have O&P ordered. Patient brought stool sample to clinic today and no order was associated. This encounter is SOLELY TO ORDER THE TEST. Please see original provider note for MDM and diagnosis.

## 2023-08-11 LAB — FUNGUS CULTURE RESULT

## 2023-08-11 LAB — FUNGUS CULTURE WITH STAIN

## 2023-08-11 LAB — FUNGAL ORGANISM REFLEX

## 2023-08-13 ENCOUNTER — Other Ambulatory Visit: Payer: Self-pay | Admitting: Pharmacist
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
## 2023-08-14 ENCOUNTER — Ambulatory Visit (HOSPITAL_COMMUNITY)
Admission: EM | Admit: 2023-08-14 | Discharge: 2023-08-14 | Discharge status: Against Medical Advice | Attending: Psychiatry | Admitting: Psychiatry

## 2023-08-14 DIAGNOSIS — F331 Major depressive disorder, recurrent, moderate: Secondary | ICD-10-CM | POA: Diagnosis not present

## 2023-08-14 DIAGNOSIS — Z5900 Homelessness unspecified: Secondary | ICD-10-CM | POA: Insufficient documentation

## 2023-08-14 DIAGNOSIS — F319 Bipolar disorder, unspecified: Secondary | ICD-10-CM | POA: Insufficient documentation
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
## 2023-08-14 NOTE — ED Notes (Signed)
 Per triage, patient left AMA.
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
## 2023-08-14 NOTE — Discharge Instructions (Signed)

## 2023-08-16 ENCOUNTER — Ambulatory Visit (HOSPITAL_COMMUNITY)
Admission: EM | Admit: 2023-08-16 | Discharge: 2023-08-16 | Disposition: A | Discharge status: Home / Self Care | Attending: Family Medicine | Admitting: Family Medicine

## 2023-08-16 ENCOUNTER — Telehealth (HOSPITAL_COMMUNITY): Payer: Self-pay

## 2023-08-16 DIAGNOSIS — R195 Other fecal abnormalities: Secondary | ICD-10-CM | POA: Insufficient documentation

## 2023-08-16 NOTE — ED Notes (Signed)
 Patient was in department strictly for providing a stool sample.  Sue Lush, RN has had conversation with patient and explained what was needed .   Sample was placed in lab, verified a label on specimen

## 2023-08-16 NOTE — Telephone Encounter (Signed)
Noted and agree with recs

## 2023-08-16 NOTE — Telephone Encounter (Signed)
 Pt LVM inquiring about stool sample results. Per chart review, pt was seen on 3/7 and returned on 3/8 with sample.  No results.  Placed call to lab. Sample was not found. TC to pt to advise of this. States she is still having symptoms. Advised she could return to be seen or to provide sample. Verbalized understanding.

## 2023-08-17 ENCOUNTER — Other Ambulatory Visit (HOSPITAL_COMMUNITY): Admission: RE | Admit: 2023-08-17 | Source: Other Acute Inpatient Hospital

## 2023-08-17 ENCOUNTER — Ambulatory Visit (HOSPITAL_COMMUNITY): Admission: EM | Admit: 2023-08-17 | Discharge: 2023-08-17 | Disposition: A | Discharge status: Home / Self Care

## 2023-08-17 ENCOUNTER — Telehealth (HOSPITAL_COMMUNITY): Payer: Self-pay | Admitting: *Deleted

## 2023-08-17 ENCOUNTER — Other Ambulatory Visit (HOSPITAL_COMMUNITY): Payer: Self-pay | Admitting: *Deleted

## 2023-08-17 DIAGNOSIS — R195 Other fecal abnormalities: Secondary | ICD-10-CM

## 2023-08-17 NOTE — ED Notes (Signed)
 Visit placed for order for O&P

## 2023-08-17 NOTE — Telephone Encounter (Signed)
 Used for O&P order

## 2023-08-20 ENCOUNTER — Telehealth (HOSPITAL_COMMUNITY): Payer: Self-pay

## 2023-08-20 LAB — OVA + PARASITE EXAM

## 2023-08-20 LAB — O&P RESULT

## 2023-08-20 NOTE — Telephone Encounter (Signed)
 Pt called to inquire about stool sample status. Called micro lab to ensure they had the sample and that there were no issues with the order. Was told sample was received and appeared to be in process. Pt updated.

## 2023-08-21 LAB — ACID FAST CULTURE WITH REFLEXED SENSITIVITIES (MYCOBACTERIA): Acid Fast Culture: NEGATIVE

## 2023-08-23 LAB — ACID FAST CULTURE WITH REFLEXED SENSITIVITIES (MYCOBACTERIA)
Acid Fast Culture: NEGATIVE
Acid Fast Culture: NEGATIVE
Acid Fast Culture: NEGATIVE
Acid Fast Culture: NEGATIVE

## 2023-08-25 LAB — ACID FAST CULTURE WITH REFLEXED SENSITIVITIES (MYCOBACTERIA): Acid Fast Culture: NEGATIVE

## 2023-08-28 ENCOUNTER — Emergency Department (HOSPITAL_COMMUNITY)
Admission: EM | Admit: 2023-08-28 | Discharge: 2023-08-30 | Disposition: A | Discharge status: Home / Self Care | Attending: Emergency Medicine | Admitting: Emergency Medicine

## 2023-08-28 ENCOUNTER — Encounter (HOSPITAL_COMMUNITY): Payer: Self-pay | Admitting: Emergency Medicine

## 2023-08-28 ENCOUNTER — Other Ambulatory Visit: Payer: Self-pay

## 2023-08-28 DIAGNOSIS — Z794 Long term (current) use of insulin: Secondary | ICD-10-CM | POA: Insufficient documentation

## 2023-08-28 DIAGNOSIS — F32A Depression, unspecified: Secondary | ICD-10-CM | POA: Diagnosis present

## 2023-08-28 DIAGNOSIS — R45851 Suicidal ideations: Secondary | ICD-10-CM | POA: Diagnosis not present

## 2023-08-28 DIAGNOSIS — Z7951 Long term (current) use of inhaled steroids: Secondary | ICD-10-CM | POA: Insufficient documentation

## 2023-08-28 DIAGNOSIS — Z5900 Homelessness unspecified: Secondary | ICD-10-CM | POA: Diagnosis not present

## 2023-08-28 DIAGNOSIS — Z91040 Latex allergy status: Secondary | ICD-10-CM | POA: Diagnosis not present

## 2023-08-28 DIAGNOSIS — R739 Hyperglycemia, unspecified: Secondary | ICD-10-CM

## 2023-08-28 DIAGNOSIS — J449 Chronic obstructive pulmonary disease, unspecified: Secondary | ICD-10-CM | POA: Diagnosis not present

## 2023-08-28 DIAGNOSIS — E1165 Type 2 diabetes mellitus with hyperglycemia: Secondary | ICD-10-CM | POA: Diagnosis not present

## 2023-08-28 LAB — I-STAT CHEM 8, ED
BUN: 22 mg/dL — ABNORMAL HIGH (ref 6–20)
Calcium, Ion: 1.05 mmol/L — ABNORMAL LOW (ref 1.15–1.40)
Chloride: 96 mmol/L — ABNORMAL LOW (ref 98–111)
Creatinine, Ser: 0.5 mg/dL (ref 0.44–1.00)
Glucose, Bld: 432 mg/dL — ABNORMAL HIGH (ref 70–99)
HCT: 50 % — ABNORMAL HIGH (ref 36.0–46.0)
Hemoglobin: 17 g/dL — ABNORMAL HIGH (ref 12.0–15.0)
Potassium: 4.3 mmol/L (ref 3.5–5.1)
Sodium: 131 mmol/L — ABNORMAL LOW (ref 135–145)
TCO2: 25 mmol/L (ref 22–32)

## 2023-08-28 LAB — CBG MONITORING, ED
Glucose-Capillary: 198 mg/dL — ABNORMAL HIGH (ref 70–99)
Glucose-Capillary: 273 mg/dL — ABNORMAL HIGH (ref 70–99)
Glucose-Capillary: 380 mg/dL — ABNORMAL HIGH (ref 70–99)
Glucose-Capillary: 452 mg/dL — ABNORMAL HIGH (ref 70–99)
Glucose-Capillary: 462 mg/dL — ABNORMAL HIGH (ref 70–99)
Glucose-Capillary: 572 mg/dL (ref 70–99)

## 2023-08-28 LAB — I-STAT VENOUS BLOOD GAS, ED
Acid-Base Excess: 2 mmol/L (ref 0.0–2.0)
Bicarbonate: 27.8 mmol/L (ref 20.0–28.0)
Calcium, Ion: 1.07 mmol/L — ABNORMAL LOW (ref 1.15–1.40)
HCT: 51 % — ABNORMAL HIGH (ref 36.0–46.0)
Hemoglobin: 17.3 g/dL — ABNORMAL HIGH (ref 12.0–15.0)
O2 Saturation: 48 %
Potassium: 4.3 mmol/L (ref 3.5–5.1)
Sodium: 132 mmol/L — ABNORMAL LOW (ref 135–145)
TCO2: 29 mmol/L (ref 22–32)
pCO2, Ven: 46.9 mmHg (ref 44–60)
pH, Ven: 7.382 (ref 7.25–7.43)
pO2, Ven: 27 mmHg — CL (ref 32–45)

## 2023-08-28 LAB — CBC WITH DIFFERENTIAL/PLATELET
Abs Immature Granulocytes: 0.02 10*3/uL (ref 0.00–0.07)
Basophils Absolute: 0.1 10*3/uL (ref 0.0–0.1)
Basophils Relative: 1 %
Eosinophils Absolute: 0.2 10*3/uL (ref 0.0–0.5)
Eosinophils Relative: 2 %
HCT: 49.9 % — ABNORMAL HIGH (ref 36.0–46.0)
Hemoglobin: 16.5 g/dL — ABNORMAL HIGH (ref 12.0–15.0)
Immature Granulocytes: 0 %
Lymphocytes Relative: 20 %
Lymphs Abs: 1.9 10*3/uL (ref 0.7–4.0)
MCH: 32.2 pg (ref 26.0–34.0)
MCHC: 33.1 g/dL (ref 30.0–36.0)
MCV: 97.3 fL (ref 80.0–100.0)
Monocytes Absolute: 0.8 10*3/uL (ref 0.1–1.0)
Monocytes Relative: 9 %
Neutro Abs: 6.3 10*3/uL (ref 1.7–7.7)
Neutrophils Relative %: 68 %
Platelets: 243 10*3/uL (ref 150–400)
RBC: 5.13 MIL/uL — ABNORMAL HIGH (ref 3.87–5.11)
RDW: 13.2 % (ref 11.5–15.5)
WBC: 9.3 10*3/uL (ref 4.0–10.5)
nRBC: 0 % (ref 0.0–0.2)

## 2023-08-28 LAB — BASIC METABOLIC PANEL WITH GFR
Anion gap: 13 (ref 5–15)
BUN: 18 mg/dL (ref 6–20)
CO2: 23 mmol/L (ref 22–32)
Calcium: 8.8 mg/dL — ABNORMAL LOW (ref 8.9–10.3)
Chloride: 96 mmol/L — ABNORMAL LOW (ref 98–111)
Creatinine, Ser: 0.58 mg/dL (ref 0.44–1.00)
GFR, Estimated: >60 mL/min (ref >60)
Glucose, Bld: 459 mg/dL — ABNORMAL HIGH (ref 70–99)
Potassium: 4.3 mmol/L (ref 3.5–5.1)
Sodium: 132 mmol/L — ABNORMAL LOW (ref 135–145)

## 2023-08-28 LAB — URINALYSIS, ROUTINE W REFLEX MICROSCOPIC
Bacteria, UA: NONE SEEN
Bilirubin Urine: NEGATIVE
Glucose, UA: >=500 mg/dL — AB
Hgb urine dipstick: NEGATIVE
Ketones, ur: 20 mg/dL — AB
Leukocytes,Ua: NEGATIVE
Nitrite: NEGATIVE
Protein, ur: NEGATIVE mg/dL
Specific Gravity, Urine: 1.031 — ABNORMAL HIGH (ref 1.005–1.030)
pH: 5 (ref 5.0–8.0)

## 2023-08-28 LAB — BETA-HYDROXYBUTYRIC ACID: Beta-Hydroxybutyric Acid: 1.35 mmol/L — ABNORMAL HIGH (ref 0.05–0.27)

## 2023-08-28 LAB — OSMOLALITY: Osmolality: 307 mosm/kg — ABNORMAL HIGH (ref 275–295)

## 2023-08-28 MED ORDER — INSULIN ASPART 100 UNIT/ML IJ SOLN
10.0000 [IU] | Freq: Once | INTRAMUSCULAR | Status: AC
Start: 2023-08-28 — End: 2023-08-28
  Administered 2023-08-28: 10 [IU] via SUBCUTANEOUS

## 2023-08-28 MED ORDER — INSULIN ASPART 100 UNIT/ML IJ SOLN
10.0000 [IU] | Freq: Three times a day (TID) | INTRAMUSCULAR | Status: DC
  Administered 2023-08-28 – 2023-08-29 (×3): 10 [IU] via SUBCUTANEOUS

## 2023-08-28 MED ORDER — BARRIER CREAM NON-SPECIFIED
1.0000 | TOPICAL_CREAM | Freq: Two times a day (BID) | TOPICAL | Status: DC | PRN
  Filled 2023-08-28 (×2): qty 1

## 2023-08-28 MED ORDER — ALBUTEROL SULFATE HFA 108 (90 BASE) MCG/ACT IN AERS: 1.0000 | INHALATION_SPRAY | Freq: Four times a day (QID) | RESPIRATORY_TRACT | Status: DC | PRN

## 2023-08-28 MED ORDER — LACTATED RINGERS IV BOLUS
1000.0000 mL | Freq: Once | INTRAVENOUS | Status: AC
  Administered 2023-08-28: 1000 mL via INTRAVENOUS

## 2023-08-28 MED ORDER — INSULIN GLARGINE-YFGN 100 UNIT/ML ~~LOC~~ SOLN
20.0000 [IU] | Freq: Every day | SUBCUTANEOUS | Status: DC
  Administered 2023-08-28 – 2023-08-29 (×2): 20 [IU] via SUBCUTANEOUS
  Filled 2023-08-28 (×4): qty 0.2

## 2023-08-28 NOTE — ED Notes (Signed)
 Pt was provided with pb and graham crackers, pt requested extra peanut butters,pt is now taking a nap

## 2023-08-28 NOTE — ED Provider Notes (Signed)
 [facility] EMERGENCY DEPARTMENT AT [facility] Provider Note   CSN: [id number] Arrival date & time: 08/28/23  0015     History  Chief Complaint  Patient presents with   Depression    Courtney Bennett is a 58 y.o. female.  Patient presents to the emergency room via EMS with a chief complaint of depression.  She states she is homeless and has multiple medical problems and is tired of living.  Patient denies any specific plan to kill herself at this time.  She denies any hallucinations or homicidal ideations.  The patient is an insulin-dependent type 2 diabetic with history of paroxysmal atrial flutter, fibromyalgia, COPD, protein calorie malnutrition.  She states she has an extremely hard time managing her chronic illnesses due to her current living situation.  She is living in a [place] at this time.  She has been homeless for the past 18 months.  She reports having no way to store insulin and frequently goes without.  She also reports having poor access to nutritious foods and frequently eats peanut butter crackers and other foods which are detrimental to her diabetes.  Patient also has skin wounds and has been referred to dermatology but dermatology was unable to see her until she gets an official referral From a PCP.  The patient had no PCP and has been referred to [facility] primary care but they are unable to see her until the end of April.  She states the wounds are painful.  She was given a course of Keflex for antibiotic coverage from one of the urgent cares.  She currently denies chest pain, abdominal pain, nausea, vomiting, shortness of breath, urinary symptoms.  The patient initially wanted to go to the behavioral health urgent care but felt they would be unable to see her due to her blood sugar elevation and due to her skin wounds.   Depression       Home Medications Prior to Admission medications   Medication Sig Start Date End Date Taking? Authorizing Provider  albuterol  (VENTOLIN HFA) 108 (90 Base) MCG/ACT inhaler Inhale 1-2 puffs into the lungs every 6 (six) hours as needed. Patient taking differently: Inhale 1-2 puffs into the lungs every 6 (six) hours as needed for wheezing or shortness of breath. 06/09/23   Pokhrel, Rebekah Chesterfield, MD  alum & mag hydroxide-simeth (MAALOX/MYLANTA) 200-200-20 MG/5ML suspension Take 30 mLs by mouth every 4 (four) hours as needed for indigestion, heartburn or flatulence. 07/21/23   Leeroy Bock, MD  amLODipine (NORVASC) 10 MG tablet Take 1 tablet (10 mg total) by mouth daily. Patient not taking: Reported on 07/07/2023 06/09/23   Pokhrel, Rebekah Chesterfield, MD  fluticasone (FLONASE) 50 MCG/ACT nasal spray Place 2 sprays into both nostrils daily for 14 days. 07/21/23 08/04/23  Leeroy Bock, MD  insulin aspart (NOVOLOG) 100 UNIT/ML injection Inject 0-9 Units into the skin 3 (three) times daily with meals. 07/21/23   Leeroy Bock, MD  insulin aspart (NOVOLOG) 100 UNIT/ML injection Inject 10 Units into the skin 3 (three) times daily after meals. 07/21/23   Leeroy Bock, MD  insulin glargine-yfgn (SEMGLEE) 100 UNIT/ML injection Inject 0.2 mLs (20 Units total) into the skin daily. 07/21/23   Leeroy Bock, MD  LANTUS SOLOSTAR 100 UNIT/ML Solostar Pen Inject into the skin. 08/04/23   [provider]  losartan (COZAAR) 25 MG tablet Take 1 tablet (25 mg total) by mouth daily. 07/21/23   Leeroy Bock, MD  mupirocin ointment (BACTROBAN) 2 %  Apply topically. 07/27/23   [provider]  nitroGLYCERIN (NITROSTAT) 0.4 MG SL tablet Place 1 tablet (0.4 mg total) under the tongue every 5 (five) minutes as needed for chest pain. 06/09/23   Pokhrel, Rebekah Chesterfield, MD  polyethylene glycol (MIRALAX / GLYCOLAX) 17 g packet Take 17 g by mouth daily as needed for mild constipation. 07/21/23   Leeroy Bock, MD  Throat Lozenges (COUGH DROPS MENTHOL) LOZG Use as directed 1 lozenge in the mouth or throat every 2 (two) hours as needed (for a  sore throat).    [provider]  umeclidinium bromide (INCRUSE ELLIPTA) 62.5 MCG/ACT AEPB Inhale 1 puff into the lungs daily. 03/22/23   Burnadette Pop, MD      Allergies    Nickel, Oxycodone-acetaminophen, Metformin, Other, Elemental sulfur, Lamictal [lamotrigine], Latex, Sulfa antibiotics, and Tape    Review of Systems   Review of Systems  Psychiatric/Behavioral:  Positive for depression.     Physical Exam Updated Vital Signs BP (!) 155/71   Pulse 84   Temp 98.1 F (36.7 C) (Oral)   Resp 20   LMP 11/24/2014   SpO2 95%  Physical Exam Vitals and nursing note reviewed.  Constitutional:      General: She is not in acute distress.    Appearance: She is well-developed.  HENT:     Head: Normocephalic and atraumatic.  Eyes:     Conjunctiva/sclera: Conjunctivae normal.     Pupils: Pupils are equal, round, and reactive to light.  Cardiovascular:     Rate and Rhythm: Normal rate and regular rhythm.  Pulmonary:     Effort: Pulmonary effort is normal. No respiratory distress.     Breath sounds: Normal breath sounds.  Abdominal:     Palpations: Abdomen is soft.     Tenderness: There is no abdominal tenderness.  Musculoskeletal:        General: No swelling.     Cervical back: Neck supple.  Skin:    General: Skin is warm and dry.     Capillary Refill: Capillary refill takes less than 2 seconds.     Findings: Lesion present.     Comments: Scabbed wound on posterior right shoulder with no sign of infection  Neurological:     Mental Status: She is alert.  Psychiatric:        Mood and Affect: Mood normal.     ED Results / Procedures / Treatments   Labs (all labs ordered are listed, but only abnormal results are displayed) Labs Reviewed  BETA-HYDROXYBUTYRIC ACID - Abnormal; Notable for the following components:      Result Value   Beta-Hydroxybutyric Acid 1.35 (*)    All other components within normal limits  BASIC METABOLIC PANEL WITH GFR - Abnormal; Notable for  the following components:   Sodium 132 (*)    Chloride 96 (*)    Glucose, Bld 459 (*)    Calcium 8.8 (*)    All other components within normal limits  CBC WITH DIFFERENTIAL/PLATELET - Abnormal; Notable for the following components:   RBC 5.13 (*)    Hemoglobin 16.5 (*)    HCT 49.9 (*)    All other components within normal limits  URINALYSIS, ROUTINE W REFLEX MICROSCOPIC - Abnormal; Notable for the following components:   Color, Urine STRAW (*)    Specific Gravity, Urine 1.031 (*)    Glucose, UA >=500 (*)    Ketones, ur 20 (*)    All other components within normal limits  OSMOLALITY -  Abnormal; Notable for the following components:   Osmolality 307 (*)    All other components within normal limits  CBG MONITORING, ED - Abnormal; Notable for the following components:   Glucose-Capillary 452 (*)    All other components within normal limits  I-STAT VENOUS BLOOD GAS, ED - Abnormal; Notable for the following components:   pO2, Ven 27 (*)    Sodium 132 (*)    Calcium, Ion 1.07 (*)    HCT 51.0 (*)    Hemoglobin 17.3 (*)    All other components within normal limits  I-STAT CHEM 8, ED - Abnormal; Notable for the following components:   Sodium 131 (*)    Chloride 96 (*)    BUN 22 (*)    Glucose, Bld 432 (*)    Calcium, Ion 1.05 (*)    Hemoglobin 17.0 (*)    HCT 50.0 (*)    All other components within normal limits  CBG MONITORING, ED - Abnormal; Notable for the following components:   Glucose-Capillary 273 (*)    All other components within normal limits    EKG None  Radiology No results found.  Procedures Procedures    Medications Ordered in ED Medications  barrier cream (non-specified) 1 Application (has no administration in time range)  albuterol (VENTOLIN HFA) 108 (90 Base) MCG/ACT inhaler 1-2 puff (has no administration in time range)  insulin aspart (novoLOG) injection 10 Units (has no administration in time range)  insulin glargine-yfgn (SEMGLEE) injection 20  Units (has no administration in time range)  lactated ringers bolus 1,000 mL (0 mLs Intravenous Stopped 08/28/23 0231)  insulin aspart (novoLOG) injection 10 Units (10 Units Subcutaneous Given 08/28/23 0231)    ED Course/ Medical Decision Making/ A&P                                 Medical Decision Making Amount and/or Complexity of Data Reviewed Labs: ordered.  Risk Prescription drug management.   This patient presents to the ED for concern of depression, this involves an extensive number of treatment options, and is a complaint that carries with it a high risk of complications and morbidity.  Patient is also hyperglycemic.  The differential diagnosis includes hyperglycemia, DKA, HHS, others   Co morbidities that complicate the patient evaluation  Type II DM, bipolar affective disorder   Additional history obtained:  Additional history obtained from EMS External records from outside source obtained and reviewed including behavioral health notes   Lab Tests:  I Ordered, and personally interpreted labs.  The pertinent results include: Mildly elevated beta hydroxybutyric acid, normal pH, normal CO2, small amount of ketones in urine  Consultations Obtained:  I requested consultation with the psychiatry service  Problem List / ED Course / Critical interventions / Medication management   I ordered medication including lactated Ringer's and insulin for hyperglycemia, barrier cream for wound care Reevaluation of the patient after these medicines showed that the patient improved I have reviewed the patients home medicines and have made adjustments as needed   Social Determinants of Health:  Patient is homeless   Test / Admission - Considered:  Patient is now medically cleared for TTS evaluation.  Patient is here voluntarily.  Home diabetic medications ordered         Final Clinical Impression(s) / ED Diagnoses Final diagnoses:  Suicidal ideation  Hyperglycemia     Rx / DC Orders ED Discharge Orders  None         Pamala Duffel 08/28/23 0458    Nira Conn, MD 08/28/23 [phone]

## 2023-08-28 NOTE — Care Management (Signed)
 Housing resources added to discharge instructions

## 2023-08-28 NOTE — ED Notes (Addendum)
 Psych NP at [facility], recommending inpt

## 2023-08-28 NOTE — Consult Note (Cosign Needed Addendum)
 [facility] Psychiatric Consult Initial  Patient Name: .Courtney Bennett  MRN: [id number]  DOB: 10-31-1965  Consult Order details:  Orders (From admission, onward)     Start     Ordered   08/28/23 0336  CONSULT TO CALL ACT TEAM       Ordering Provider: Darrick Grinder, PA-C  Provider:  (Not yet assigned)  Question:  Reason for Consult?  Answer:  Suicidal ideations/depression   08/28/23 0335             Mode of Visit: In person    Psychiatry Consult Evaluation  Service Date: August 28, 2023 LOS:  LOS: 0 days  Chief Complaint  passive suicidal ideation  Primary Psychiatric Diagnoses   Suicidal Ideation 2.  Depression   Assessment  Courtney Bennett is a 58 y.o. female admitted: Presented to the EDfor 08/28/2023 12:15 AM for depression and suicidal ideation . She carries the psychiatric diagnoses of Bipolar affective disorder, depression and anxiety has a past medical history of bradycardia, hypertension, COPD, acute respiratory failure, diabetes and  tobacco dependency  Her current presentation of worsening depression is most consistent with passive thoughts of death and heart. She meets criteria for facility based crisis based at the [facility] facility.   Current  no outpatient psychotropic medications are documented.Please see plan below for detailed recommendations.   Diagnoses:  Active Hospital problems: Principal Problem:   Suicidal ideation Active Problems:   Homeless    Plan   ## Psychiatric Medication Recommendations:  Overnight observation, consideration for Facility Based Crisis  ## Medical Decision Making Capacity: Not specifically addressed in this encounter  ## Further Work-up:  -- CBGs, glucose level under 250 EKG -- most recent EKG- pending  -- Pertinent labwork reviewed earlier this admission includes: CBGs repeat EKG   ## Disposition:-- We recommend transfer to [facility].  ## Behavioral / Environmental: - No  specific recommendations at this time.     ## Safety and Observation Level:  - Based on my clinical evaluation, I estimate the patient to be at Moderate risk of self harm in the current setting. - At this time, we recommend  routine. This decision is based on my review of the chart including patient's history and current presentation, interview of the patient, mental status examination, and consideration of suicide risk including evaluating suicidal ideation, plan, intent, suicidal or self-harm behaviors, risk factors, and protective factors. This judgment is based on our ability to directly address suicide risk, implement suicide prevention strategies, and develop a safety plan while the patient is in the clinical setting. Please contact our team if there is a concern that risk level has changed.  CSSR Risk Category:C-SSRS RISK CATEGORY: Low Risk  Suicide Risk Assessment: Patient has following modifiable risk factors for suicide: untreated depression, social isolation, and medication noncompliance, which we are addressing by initiating antidepressants. Patient has following non-modifiable or demographic risk factors for suicide: psychiatric hospitalization Patient has the following protective factors against suicide: Cultural, spiritual, or religious beliefs that discourage suicide and Frustration tolerance  Thank you for this consult request. Recommendations have been communicated to the primary team.  We will overnight observation until CBGs improved repeat vitals are increased O2 saturation, consideration for Facility Based Crisis at this time.   Oneta Rack, NP       History of Present Illness  Relevant Aspects of Hospital ED Course:  Admitted on 08/28/2023 for suicidal ideation due to multiple psychosocial stressors.  Patient Report: Courtney Bennett 58 year old female presents to the emergency department with multiple complaints related to dermatological issues.  States she is homeless  and has multiple sores on her body.  States she was recently seen and evaluated at [facility] and had to stop antibiotics because she was unable to get the medications that was prescribed to her.  States she is currently homeless and is unable to take her diabetes medications as indicated.  States she is needing help with housing.  States she was follow-up by the [facility], partner of ending homelessness and [address].  States everything is a long process.  "  I am tired of trying" reports she has spoken to multiple case management and social workers throughout the hospital facilities without resolution.  Reports she never received any resources through [facility] to which she reports is the reason why the [facility] is no longer helping her.  Reports passive suicidal ideations denied plan or intent.  Denied illicit drug use.  Reports limited family support.  Per initial admission assessment note: "Courtney Bennett is a 58 y.o. female.  Patient presents to the emergency room via EMS with a chief complaint of depression.  She states she is homeless and has multiple medical problems and is tired of living.  Patient denies any specific plan to kill herself at this time.  She denies any hallucinations or homicidal ideations.  The patient is an insulin-dependent type 2 diabetic with history of paroxysmal atrial flutter, fibromyalgia, COPD, protein calorie malnutrition.  She states she has an extremely hard time managing her chronic illnesses due to her current living situation.  She is living in a [place] at this time.  She has been homeless for the past 18 months.  She reports having no way to store insulin and frequently goes without.  She also reports having poor access to nutritious foods and frequently eats peanut butter crackers and other foods which are detrimental to her diabetes."   Plan: Overnight observation with consideration for facility based crisis.  Pending glucose under 250 and repeat vital/saturations. -Will  follow-up to initiate antidepressant i.e. Zoloft 25 to 50 mg daily -TOC consult placed to address/support additional housing concerns  Psych ROS:  Depression: Due to multiple psychosocial stressors rating her depression 8 out of 10 with 10 being the worst Anxiety: Reports mild anxiety symptoms Mania (lifetime and current): Denied Psychosis: (lifetime and current): Denied  Collateral information:  N/A  Review of Systems  Psychiatric/Behavioral:  Positive for depression. Suicidal ideas: passive suicidal.The patient is nervous/anxious.      Psychiatric and Social History  Psychiatric History:  Information collected from patient   Prev Dx/Sx: Depression and anxiety suicidal ideations Current Psych Provider: Denied Home Meds (current): Denied Previous Med Trials: Unable to recall the names psychotropic medications Therapy: denied   Prior Psych Hospitalization: Previous history Prior Self Harm: Denied Prior Violence: denied   Family Psych History:  Family Hx suicide:   Social History:  Developmental Hx: n/A Educational Hx: N/A Occupational Hx: Disabled Legal Hx: Denied Living Situation: Homeless Spiritual Hx:  Access to weapons/lethal means: Denied  Substance History   Exam Findings  Physical Exam:  Vital Signs:  Temp:  [98.1 F (36.7 C)] 98.1 F (36.7 C) (03/29 0020) Pulse Rate:  [84] 84 (03/29 0233) Resp:  [20] 20 (03/29 0020) BP: (132-155)/(71-104) 155/71 (03/29 0245) SpO2:  [95 %] 95 % (03/29 0233) Blood pressure (!) 155/71, pulse 84, temperature 98.1 F (36.7 C), temperature source Oral, resp.  rate 20, last menstrual period 11/24/2014, SpO2 95%. There is no height or weight on file to calculate BMI.  Physical Exam  Mental Status Exam: General Appearance:  Disheveled tearful  Orientation:  Full (Time, Place, and Person)  Memory:  Immediate;   Good Recent;   Good  Concentration:  Concentration: Good  Recall:  Good  Attention  Fair  Eye Contact:  Good   Speech:  Clear and Coherent  Language:  Good  Volume:  Normal  Mood: Congruent  Affect:  Congruent  Thought Process:  Coherent  Thought Content:  Logical  Suicidal Thoughts: Passive ideation  Homicidal Thoughts:  No  Judgement:  Fair  Insight:  Fair  Psychomotor Activity:  Normal  Akathisia:  No  Fund of Knowledge:  Poor      Assets:  Communication Skills Desire for Improvement  Cognition:  WNL  ADL's:  Intact  AIMS (if indicated):        Other History   These have been pulled in through the EMR, reviewed, and updated if appropriate.  Family History:  The patient's family history includes Breast cancer in her mother and paternal grandmother; Diabetes in her father.  Medical History: Past Medical History:  Diagnosis Date   Bipolar 1 disorder (HCC)    COPD (chronic obstructive pulmonary disease) (HCC)    Diabetes mellitus    Fibromyalgia    Grade I diastolic dysfunction 06/06/2023   Hypertension     Surgical History: Past Surgical History:  Procedure Laterality Date   ABDOMINAL HYSTERECTOMY  01/2019   BREAST BIOPSY Right    BREAST BIOPSY Right 02/18/2023   [place] RT BREAST BX W LOC DEV 1ST LESION IMG BX SPEC US GUIDE 02/18/2023 [facility]MO[facility]AP[facility]   BRONCHIAL BIOPSY  07/09/2023   Procedure: BRONCHIAL BIOPSIES;  Surgeon: Luciano Cutter, MD;  Location: [facility];  Service: Cardiopulmonary;;   BRONCHIAL BRUSHINGS  07/09/2023   Procedure: BRONCHIAL BRUSHINGS;  Surgeon: Luciano Cutter, MD;  Location: [facility];  Service: Cardiopulmonary;;   BRONCHIAL WASHINGS  07/09/2023   Procedure: BRONCHIAL WASHINGS;  Surgeon: Luciano Cutter, MD;  Location: [facility];  Service: Cardiopulmonary;;   CESAREAN SECTION     HEMOSTASIS CONTROL  07/09/2023   Procedure: HEMOSTASIS CONTROL;  Surgeon: Luciano Cutter, MD;  Location: [facility];  Service: Cardiopulmonary;;   HERNIA REPAIR     VIDEO BRONCHOSCOPY N/A 07/09/2023   Procedure: VIDEO BRONCHOSCOPY WITH FLUORO;  Surgeon:  Luciano Cutter, MD;  Location: [facility];  Service: Cardiopulmonary;  Laterality: N/A;     Medications:   Current Facility-Administered Medications:    albuterol (VENTOLIN HFA) 108 (90 Base) MCG/ACT inhaler 1-2 puff, 1-2 puff, Inhalation, Q6H PRN, Barrie Dunker B, PA-C   barrier cream (non-specified) 1 Application, 1 Application, Topical, BID PRN, Barrie Dunker B, PA-C   insulin aspart (novoLOG) injection 10 Units, 10 Units, Subcutaneous, TID PC, [place], Larry B, PA-C   insulin glargine-yfgn (SEMGLEE) injection 20 Units, 20 Units, Subcutaneous, Daily, Darrick Grinder, PA-C, 20 Units at 08/28/23 1108  Current Outpatient Medications:    albuterol (VENTOLIN HFA) 108 (90 Base) MCG/ACT inhaler, Inhale 1-2 puffs into the lungs every 6 (six) hours as needed. (Patient taking differently: Inhale 1-2 puffs into the lungs every 6 (six) hours as needed for wheezing or shortness of breath.), Disp: 18 g, Rfl: 0   alum & mag hydroxide-simeth (MAALOX/MYLANTA) 200-200-20 MG/5ML suspension, Take 30 mLs by mouth every 4 (four) hours as needed for indigestion, heartburn or flatulence., Disp: ,  Rfl:    amLODipine (NORVASC) 10 MG tablet, Take 1 tablet (10 mg total) by mouth daily. (Patient not taking: Reported on 07/07/2023), Disp: 30 tablet, Rfl: 1   fluticasone (FLONASE) 50 MCG/ACT nasal spray, Place 2 sprays into both nostrils daily for 14 days., Disp: , Rfl:    insulin aspart (NOVOLOG) 100 UNIT/ML injection, Inject 0-9 Units into the skin 3 (three) times daily with meals., Disp: , Rfl:    insulin aspart (NOVOLOG) 100 UNIT/ML injection, Inject 10 Units into the skin 3 (three) times daily after meals., Disp: , Rfl:    insulin glargine-yfgn (SEMGLEE) 100 UNIT/ML injection, Inject 0.2 mLs (20 Units total) into the skin daily., Disp: , Rfl:    LANTUS SOLOSTAR 100 UNIT/ML Solostar Pen, Inject into the skin., Disp: , Rfl:    losartan (COZAAR) 25 MG tablet, Take 1 tablet (25 mg total) by mouth daily., Disp: ,  Rfl:    mupirocin ointment (BACTROBAN) 2 %, Apply topically., Disp: , Rfl:    nitroGLYCERIN (NITROSTAT) 0.4 MG SL tablet, Place 1 tablet (0.4 mg total) under the tongue every 5 (five) minutes as needed for chest pain., Disp: 25 tablet, Rfl: 0   polyethylene glycol (MIRALAX / GLYCOLAX) 17 g packet, Take 17 g by mouth daily as needed for mild constipation., Disp: , Rfl:    Throat Lozenges (COUGH DROPS MENTHOL) LOZG, Use as directed 1 lozenge in the mouth or throat every 2 (two) hours as needed (for a sore throat)., Disp: , Rfl:    umeclidinium bromide (INCRUSE ELLIPTA) 62.5 MCG/ACT AEPB, Inhale 1 puff into the lungs daily., Disp: 30 each, Rfl: 0  Allergies: Allergies  Allergen Reactions   Nickel Hives, Rash and Other (See Comments)    Rash after ear piercing and navel piercings      Oxycodone-Acetaminophen Nausea And Vomiting   Metformin Diarrhea   Other Nausea And Vomiting and Other (See Comments)    Opiates- "They make me very sick."   Elemental Sulfur Hives, Rash and Other (See Comments)    "Burns the skin," also (and, had a "flu-like reaction"   Lamictal [Lamotrigine] Rash   Latex Itching and Rash   Sulfa Antibiotics Rash   Tape Rash and Other (See Comments)    Irritates the skin    Oneta Rack, NP

## 2023-08-28 NOTE — ED Notes (Signed)
Ambulatory to b/r, steady gait 

## 2023-08-28 NOTE — ED Notes (Signed)
 Patient given [place] sandwich. Patient upset about [place] sandwich. Informed patient this is all that is in the ER. Upon return with ginger-ale, patient threw bagged [place] sandwich at the door. Informed patient that is not appropriate behavior in the ER. No evidence of learning, patient continued to request something other than a [place] sandwich. Bed in low locked position. Call bell within reach.

## 2023-08-28 NOTE — ED Notes (Signed)
 VOL at this time per RN

## 2023-08-28 NOTE — ED Notes (Signed)
EDP rounding 

## 2023-08-28 NOTE — ED Notes (Addendum)
No changes, continues to sleep

## 2023-08-28 NOTE — ED Notes (Signed)
 Updated with vague plan, process, timeframe. Verbalizes understanding

## 2023-08-28 NOTE — ED Triage Notes (Signed)
 BIB EMS from home with reports of depression. Reports "I'd be better off dead then living like this". Reports wanting to go to [facility] but unable d/t elevated blood sugar and skin infections. Reports being non-compliant with medications.

## 2023-08-28 NOTE — BH Assessment (Signed)
 Clinician spoke to Courtney Bennett with [facility] to complete pt's TTS assessment. Clinician provided pt's name, MRN, location, age, room number and provider's name Secure message completed.    [facility] coordinator to update secure chat when assessment time and provider are assigned.   Redmond Pulling, MS, [facility], [facility] Triage Specialist [phone]

## 2023-08-28 NOTE — ED Notes (Signed)
 Eating dinner, insulin given

## 2023-08-29 DIAGNOSIS — F32A Depression, unspecified: Secondary | ICD-10-CM | POA: Diagnosis not present

## 2023-08-29 DIAGNOSIS — R45851 Suicidal ideations: Secondary | ICD-10-CM

## 2023-08-29 LAB — CBG MONITORING, ED
Glucose-Capillary: 361 mg/dL — ABNORMAL HIGH (ref 70–99)
Glucose-Capillary: 385 mg/dL — ABNORMAL HIGH (ref 70–99)
Glucose-Capillary: 332 mg/dL — ABNORMAL HIGH (ref 70–99)
Glucose-Capillary: 154 mg/dL — ABNORMAL HIGH (ref 70–99)

## 2023-08-29 MED ORDER — INSULIN ASPART 100 UNIT/ML IJ SOLN: 0.0000 [IU] | Freq: Every day | INTRAMUSCULAR | Status: DC

## 2023-08-29 MED ORDER — BACITRACIN ZINC 500 UNIT/GM EX OINT
TOPICAL_OINTMENT | Freq: Two times a day (BID) | CUTANEOUS | Status: DC
  Administered 2023-08-30: 1 via TOPICAL
  Filled 2023-08-29 (×2): qty 0.9

## 2023-08-29 MED ORDER — INSULIN ASPART 100 UNIT/ML IJ SOLN
13.0000 [IU] | Freq: Three times a day (TID) | INTRAMUSCULAR | Status: DC
  Administered 2023-08-29: 13 [IU] via SUBCUTANEOUS

## 2023-08-29 MED ORDER — INSULIN ASPART 100 UNIT/ML IJ SOLN
6.0000 [IU] | Freq: Three times a day (TID) | INTRAMUSCULAR | Status: DC
  Administered 2023-08-29 – 2023-08-30 (×2): 6 [IU] via SUBCUTANEOUS

## 2023-08-29 MED ORDER — SERTRALINE HCL 25 MG PO TABS
25.0000 mg | ORAL_TABLET | Freq: Every day | ORAL | Status: DC
  Administered 2023-08-29 – 2023-08-30 (×2): 25 mg via ORAL
  Filled 2023-08-29 (×2): qty 1

## 2023-08-29 MED ORDER — INSULIN ASPART 100 UNIT/ML IJ SOLN
0.0000 [IU] | Freq: Three times a day (TID) | INTRAMUSCULAR | Status: DC
  Administered 2023-08-29: 2 [IU] via SUBCUTANEOUS
  Administered 2023-08-30: 7 [IU] via SUBCUTANEOUS

## 2023-08-29 NOTE — Progress Notes (Signed)
 [facility] MD Progress Note  08/29/2023 8:23 AM Courtney Bennett  MRN:  [id number] Subjective:  " I am doing so, so."   Evaluation:  Courtney Bennett 58 year old seen and evaluated due to passive suicidal ideations.  Continues to report her mood was depressed.  Passive thoughts related to self-harm.  Courtney Bennett's main focus is related to housing as she continues to have ongoing conversation related to [facility] in obtaining her identification card.  Reports she has no money for food or shelter and/or to keep her diabetic medications stored.  No concerns related to illicit drug use or substance abuse history.  Orders placed for diabetic consult as RN reports today patient's blood sugars remain over 300.  380 last CBG.  Discussed consideration for facility based crisis however due to medical limitations patient will remain in the emergency department.  Social worker provided homeless shelter resources in chart.  Will request in person face-to-face appointment by social work staff.  Support,  encouragement and reassurance was provided.  Initiated Zoloft 25 mg daily for mood stabilization.  Principal Problem: Suicidal ideation Diagnosis: Principal Problem:   Suicidal ideation Active Problems:   Homeless  Total Time spent with patient: 15 minutes  Past Psychiatric History:   Past Medical History:  Past Medical History:  Diagnosis Date   Bipolar 1 disorder (HCC)    COPD (chronic obstructive pulmonary disease) (HCC)    Diabetes mellitus    Fibromyalgia    Grade I diastolic dysfunction 06/06/2023   Hypertension     Past Surgical History:  Procedure Laterality Date   ABDOMINAL HYSTERECTOMY  01/2019   BREAST BIOPSY Right    BREAST BIOPSY Right 02/18/2023   [place] RT BREAST BX W LOC DEV 1ST LESION IMG BX SPEC US GUIDE 02/18/2023 [facility]AP[facility]   BRONCHIAL BIOPSY  07/09/2023   Procedure: BRONCHIAL BIOPSIES;  Surgeon: Luciano Cutter, MD;  Location: [facility];  Service: Cardiopulmonary;;   BRONCHIAL BRUSHINGS   07/09/2023   Procedure: BRONCHIAL BRUSHINGS;  Surgeon: Luciano Cutter, MD;  Location: [facility];  Service: Cardiopulmonary;;   BRONCHIAL WASHINGS  07/09/2023   Procedure: BRONCHIAL WASHINGS;  Surgeon: Luciano Cutter, MD;  Location: [facility];  Service: Cardiopulmonary;;   CESAREAN SECTION     HEMOSTASIS CONTROL  07/09/2023   Procedure: HEMOSTASIS CONTROL;  Surgeon: Luciano Cutter, MD;  Location: [facility];  Service: Cardiopulmonary;;   HERNIA REPAIR     VIDEO BRONCHOSCOPY N/A 07/09/2023   Procedure: VIDEO BRONCHOSCOPY WITH FLUORO;  Surgeon: Luciano Cutter, MD;  Location: [facility];  Service: Cardiopulmonary;  Laterality: N/A;   Family History:  Family History  Problem Relation Age of Onset   Diabetes Father    Breast cancer Mother    Breast cancer Paternal Grandmother    Family Psychiatric  History:  Social History:  Social History   Substance and Sexual Activity  Alcohol Use Yes   Comment: rare     Social History   Substance and Sexual Activity  Drug Use No    Social History   Socioeconomic History   Marital status: Single    Spouse name: Not on file   Number of children: Not on file   Years of education: Not on file   Highest education level: Not on file  Occupational History   Not on file  Tobacco Use   Smoking status: Former    Current packs/day: 0.00    Types: Cigarettes    Quit date: 11/21/2014    Years since quitting:  8.7   Smokeless tobacco: Never  Substance and Sexual Activity   Alcohol use: Yes    Comment: rare   Drug use: No   Sexual activity: Not on file  Other Topics Concern   Not on file  Social History Narrative   Not on file   Social Drivers of Health   Financial Resource Strain: Not at Risk (05/13/2023)   Received from [employer]    Financial Resource Strain: 1  Food Insecurity: No Food Insecurity (07/06/2023)   Hunger Vital Sign    Worried About Running Out of Food in the Last Year: Never true     Ran Out of Food in the Last Year: Never true  Recent Concern: Food Insecurity - Food Insecurity Present (06/06/2023)   Hunger Vital Sign    Worried About Running Out of Food in the Last Year: Often true    Ran Out of Food in the Last Year: Often true  Transportation Needs: No Transportation Needs (07/07/2023)   PRAPARE - Administrator, Civil Service (Medical): No    Lack of Transportation (Non-Medical): No  Recent Concern: Transportation Needs - Unmet Transportation Needs (06/06/2023)   PRAPARE - Transportation    Lack of Transportation (Medical): Yes    Lack of Transportation (Non-Medical): Yes  Physical Activity: Not on File (04/01/2023)   Received from [facility]   Physical Activity    Physical Activity: 0  Stress: Not on File (04/01/2023)   Received from [facility]   Stress    Stress: 0  Social Connections: Moderately Integrated (06/06/2023)   Social Connection and Isolation Panel [NHANES]    Frequency of Communication with Friends and Family: More than three times a week    Frequency of Social Gatherings with Friends and Family: Never    Attends Religious Services: 1 to 4 times per year    Active Member of [employer] or Organizations: Yes    Attends Banker Meetings: 1 to 4 times per year    Marital Status: Separated   Additional Social History:                         Sleep: Fair  Appetite:  Fair  Current Medications: Current Facility-Administered Medications  Medication Dose Route Frequency Provider Last Rate Last Admin   albuterol (VENTOLIN HFA) 108 (90 Base) MCG/ACT inhaler 1-2 puff  1-2 puff Inhalation Q6H PRN Barrie Dunker B, PA-C       barrier cream (non-specified) 1 Application  1 Application Topical BID PRN Barrie Dunker B, PA-C       insulin aspart (novoLOG) injection 10 Units  10 Units Subcutaneous TID PC Barrie Dunker B, PA-C   10 Units at 08/28/23 1804   insulin glargine-yfgn (SEMGLEE) injection 20 Units  20 Units Subcutaneous Daily  Darrick Grinder, PA-C   20 Units at 08/28/23 1108   Current Outpatient Medications  Medication Sig Dispense Refill   albuterol (VENTOLIN HFA) 108 (90 Base) MCG/ACT inhaler Inhale 1-2 puffs into the lungs every 6 (six) hours as needed. (Patient taking differently: Inhale 1-2 puffs into the lungs every 6 (six) hours as needed for wheezing or shortness of breath.) 18 g 0   alum & mag hydroxide-simeth (MAALOX/MYLANTA) 200-200-20 MG/5ML suspension Take 30 mLs by mouth every 4 (four) hours as needed for indigestion, heartburn or flatulence.     amLODipine (NORVASC) 10 MG tablet Take 1 tablet (10 mg total) by mouth daily. (Patient  not taking: Reported on 07/07/2023) 30 tablet 1   fluticasone (FLONASE) 50 MCG/ACT nasal spray Place 2 sprays into both nostrils daily for 14 days.     insulin aspart (NOVOLOG) 100 UNIT/ML injection Inject 0-9 Units into the skin 3 (three) times daily with meals.     insulin aspart (NOVOLOG) 100 UNIT/ML injection Inject 10 Units into the skin 3 (three) times daily after meals.     insulin glargine-yfgn (SEMGLEE) 100 UNIT/ML injection Inject 0.2 mLs (20 Units total) into the skin daily.     LANTUS SOLOSTAR 100 UNIT/ML Solostar Pen Inject into the skin.     losartan (COZAAR) 25 MG tablet Take 1 tablet (25 mg total) by mouth daily.     mupirocin ointment (BACTROBAN) 2 % Apply topically.     nitroGLYCERIN (NITROSTAT) 0.4 MG SL tablet Place 1 tablet (0.4 mg total) under the tongue every 5 (five) minutes as needed for chest pain. 25 tablet 0   polyethylene glycol (MIRALAX / GLYCOLAX) 17 g packet Take 17 g by mouth daily as needed for mild constipation.     Throat Lozenges (COUGH DROPS MENTHOL) LOZG Use as directed 1 lozenge in the mouth or throat every 2 (two) hours as needed (for a sore throat).     umeclidinium bromide (INCRUSE ELLIPTA) 62.5 MCG/ACT AEPB Inhale 1 puff into the lungs daily. 30 each 0    Lab Results:  Results for orders placed or performed during the hospital  encounter of 08/28/23 (from the past 48 hours)  CBG monitoring, ED     Status: Abnormal   Collection Time: 08/28/23 12:16 AM  Result Value Ref Range   Glucose-Capillary 452 (H) 70 - 99 mg/dL    Comment: Glucose reference range applies only to samples taken after fasting for at least 8 hours.   Comment 1 Notify RN   Urinalysis, Routine w reflex microscopic -Urine, Clean Catch     Status: Abnormal   Collection Time: 08/28/23 12:24 AM  Result Value Ref Range   Color, Urine STRAW (A) YELLOW   APPearance CLEAR CLEAR   Specific Gravity, Urine 1.031 (H) 1.005 - 1.030   pH 5.0 5.0 - 8.0   Glucose, UA >=500 (A) NEGATIVE mg/dL   Hgb urine dipstick NEGATIVE NEGATIVE   Bilirubin Urine NEGATIVE NEGATIVE   Ketones, ur 20 (A) NEGATIVE mg/dL   Protein, ur NEGATIVE NEGATIVE mg/dL   Nitrite NEGATIVE NEGATIVE   Leukocytes,Ua NEGATIVE NEGATIVE   RBC / HPF 0-5 0 - 5 RBC/hpf   WBC, UA 0-5 0 - 5 WBC/hpf   Bacteria, UA NONE SEEN NONE SEEN   Squamous Epithelial / HPF 0-5 0 - 5 /HPF    Comment: Performed at [facility], [address], [place], [place] [address]  Beta-hydroxybutyric acid     Status: Abnormal   Collection Time: 08/28/23 12:36 AM  Result Value Ref Range   Beta-Hydroxybutyric Acid 1.35 (H) 0.05 - 0.27 mmol/L    Comment: Performed at [facility], [address], [place], [place] [address]  Basic metabolic panel     Status: Abnormal   Collection Time: 08/28/23 12:36 AM  Result Value Ref Range   Sodium 132 (L) 135 - 145 mmol/L   Potassium 4.3 3.5 - 5.1 mmol/L   Chloride 96 (L) 98 - 111 mmol/L   CO2 23 22 - 32 mmol/L   Glucose, Bld 459 (H) 70 - 99 mg/dL    Comment: Glucose reference range applies only to samples taken  after fasting for at least 8 hours.   BUN 18 6 - 20 mg/dL   Creatinine, Ser 4.09 0.44 - 1.00 mg/dL   Calcium 8.8 (L) 8.9 - 10.3 mg/dL   GFR, Estimated >81 >19 mL/min    Comment: (NOTE) Calculated using the CKD-EPI Creatinine Equation (2021)    Anion gap 13 5  - 15    Comment: Performed at [facility], [address] [place], [place] [address]  CBC with Differential     Status: Abnormal   Collection Time: 08/28/23 12:36 AM  Result Value Ref Range   WBC 9.3 4.0 - 10.5 K/uL   RBC 5.13 (H) 3.87 - 5.11 MIL/uL   Hemoglobin 16.5 (H) 12.0 - 15.0 g/dL   HCT 95.6 (H) 21.3 - 08.6 %   MCV 97.3 80.0 - 100.0 fL   MCH 32.2 26.0 - 34.0 pg   MCHC 33.1 30.0 - 36.0 g/dL   RDW 57.8 46.9 - 62.9 %   Platelets 243 150 - 400 K/uL   nRBC 0.0 0.0 - 0.2 %   Neutrophils Relative % 68 %   Neutro Abs 6.3 1.7 - 7.7 K/uL   Lymphocytes Relative 20 %   Lymphs Abs 1.9 0.7 - 4.0 K/uL   Monocytes Relative 9 %   Monocytes Absolute 0.8 0.1 - 1.0 K/uL   Eosinophils Relative 2 %   Eosinophils Absolute 0.2 0.0 - 0.5 K/uL   Basophils Relative 1 %   Basophils Absolute 0.1 0.0 - 0.1 K/uL   Immature Granulocytes 0 %   Abs Immature Granulocytes 0.02 0.00 - 0.07 K/uL    Comment: Performed at [facility], [address], [place], [place] [address]  Osmolality     Status: Abnormal   Collection Time: 08/28/23 12:36 AM  Result Value Ref Range   Osmolality 307 (H) 275 - 295 mOsm/kg    Comment: Performed at [facility], [address], [place], [place] [address]  I-Stat venous blood gas, [facility], [facility], [facility])     Status: Abnormal   Collection Time: 08/28/23 12:45 AM  Result Value Ref Range   pH, Ven 7.382 7.25 - 7.43   pCO2, Ven 46.9 44 - 60 mmHg   pO2, Ven 27 (LL) 32 - 45 mmHg   Bicarbonate 27.8 20.0 - 28.0 mmol/L   TCO2 29 22 - 32 mmol/L   O2 Saturation 48 %   Acid-Base Excess 2.0 0.0 - 2.0 mmol/L   Sodium 132 (L) 135 - 145 mmol/L   Potassium 4.3 3.5 - 5.1 mmol/L   Calcium, Ion 1.07 (L) 1.15 - 1.40 mmol/L   HCT 51.0 (H) 36.0 - 46.0 %   Hemoglobin 17.3 (H) 12.0 - 15.0 g/dL   Sample type VENOUS    Comment NOTIFIED PHYSICIAN   I-stat chem 8, ED (not at [facility], [facility] or [facility])     Status: Abnormal   Collection Time: 08/28/23 12:45 AM  Result Value Ref Range    Sodium 131 (L) 135 - 145 mmol/L   Potassium 4.3 3.5 - 5.1 mmol/L   Chloride 96 (L) 98 - 111 mmol/L   BUN 22 (H) 6 - 20 mg/dL   Creatinine, Ser 1.02 0.44 - 1.00 mg/dL   Glucose, Bld 725 (H) 70 - 99 mg/dL    Comment: Glucose reference range applies only to samples taken after fasting for at least 8 hours.   Calcium, Ion 1.05 (L) 1.15 - 1.40 mmol/L   TCO2 25 22 -  32 mmol/L   Hemoglobin 17.0 (H) 12.0 - 15.0 g/dL   HCT 40.9 (H) 81.1 - 91.4 %  POC CBG, ED     Status: Abnormal   Collection Time: 08/28/23  3:16 AM  Result Value Ref Range   Glucose-Capillary 273 (H) 70 - 99 mg/dL    Comment: Glucose reference range applies only to samples taken after fasting for at least 8 hours.  POC CBG, ED     Status: Abnormal   Collection Time: 08/28/23  7:50 AM  Result Value Ref Range   Glucose-Capillary 198 (H) 70 - 99 mg/dL    Comment: Glucose reference range applies only to samples taken after fasting for at least 8 hours.   Comment 1 Notify RN    Comment 2 Document in Chart   CBG monitoring, ED     Status: Abnormal   Collection Time: 08/28/23  1:01 PM  Result Value Ref Range   Glucose-Capillary 572 (HH) 70 - 99 mg/dL    Comment: Glucose reference range applies only to samples taken after fasting for at least 8 hours.  CBG monitoring, ED     Status: Abnormal   Collection Time: 08/28/23  2:21 PM  Result Value Ref Range   Glucose-Capillary 462 (H) 70 - 99 mg/dL    Comment: Glucose reference range applies only to samples taken after fasting for at least 8 hours.  CBG monitoring, ED     Status: Abnormal   Collection Time: 08/28/23 11:58 PM  Result Value Ref Range   Glucose-Capillary 380 (H) 70 - 99 mg/dL    Comment: Glucose reference range applies only to samples taken after fasting for at least 8 hours.   Comment 1 Notify RN    Comment 2 Document in Chart   CBG monitoring, ED     Status: Abnormal   Collection Time: 08/29/23  8:08 AM  Result Value Ref Range   Glucose-Capillary 361 (H) 70 - 99  mg/dL    Comment: Glucose reference range applies only to samples taken after fasting for at least 8 hours.    Blood Alcohol level:  Lab Results  Component Value Date   ETH <10 06/04/2023   ETH <10 03/14/2023    Metabolic Disorder Labs: Lab Results  Component Value Date   HGBA1C 13.1 (H) 06/05/2023   MPG 329 06/05/2023   MPG 298 11/30/2022   No results found for: "PROLACTIN" Lab Results  Component Value Date   CHOL 134 12/28/2022   TRIG 84 12/28/2022   HDL 50 12/28/2022   CHOLHDL 2.7 12/28/2022   VLDL 17 12/28/2022   LDLCALC 67 12/28/2022   LDLCALC 121 (H) 05/02/2019    Physical Findings: AIMS:  , ,  ,  ,    CIWA:    COWS:     Musculoskeletal: Strength & Muscle Tone: within normal limits Gait & Station: normal Patient leans: N/A  Psychiatric Specialty Exam:  Presentation  General Appearance:  Disheveled  Eye Contact: Minimal  Speech: Clear and Coherent  Speech Volume: Normal  Handedness: Right   Mood and Affect  Mood: Depressed  Affect: Congruent   Thought Process  Thought Processes: Coherent  Descriptions of Associations:Intact  Orientation:Full (Time, Place and Person)  Thought Content:Logical  History of Schizophrenia/Schizoaffective disorder:No data recorded Duration of Psychotic Symptoms:No data recorded Hallucinations:No data recorded Ideas of Reference:None  Suicidal Thoughts:No data recorded Homicidal Thoughts:No data recorded  Sensorium  Memory: Immediate Fair; Recent Fair  Judgment: Fair  Insight: Curator  Functions  Concentration: Fair  Attention Span: Good  Recall: Good  Fund of Knowledge: Fair  Language: Fair   Psychomotor Activity  Psychomotor Activity:No data recorded  Assets  Assets: Desire for Improvement; Financial Resources/Insurance; Social Support   Sleep  Sleep:No data recorded   Physical Exam: Physical Exam Vitals reviewed.  Constitutional:      Appearance:  Normal appearance.  Psychiatric:        Mood and Affect: Mood normal.        Behavior: Behavior normal.        Thought Content: Thought content normal.    Review of Systems  Psychiatric/Behavioral:  Positive for depression. Negative for suicidal ideas. The patient is nervous/anxious.   All other systems reviewed and are negative.  Blood pressure (!) 146/63, pulse 84, temperature 98.1 F (36.7 C), temperature source Oral, resp. rate 16, last menstrual period 11/24/2014, SpO2 90%. There is no height or weight on file to calculate BMI.   Treatment Plan Summary: Daily contact with patient to assess and evaluate symptoms and progress in treatment and Medication management -Awaiting diabetes care coordinator for management of blood glucose levels -Initiated Zoloft 25 mg daily -Consideration for facility based crisis/case management  Oneta Rack, NP 08/29/2023, 8:23 AM

## 2023-08-29 NOTE — ED Notes (Signed)
 Pt getting agitated and wants to leave. I gave pt her belongings back and told her she could leave AMA. Pt now refusing to leave. I took her belongings back and placed them back in a locker. Pt sitting in her room cussing at me. Pt is not leaving at this time now.

## 2023-08-29 NOTE — ED Notes (Signed)
 Pt refused to allow me to check her CBG tonight

## 2023-08-29 NOTE — ED Provider Notes (Signed)
 Emergency Medicine Observation Re-evaluation Note  Courtney Bennett is a 58 y.o. female, seen on rounds today.  Pt initially presented to the ED for complaints of Depression Currently, the patient is resting.  Physical Exam  BP (!) 153/89 (BP Location: Left Arm)   Pulse 94   Temp 98.1 F (36.7 C) (Oral)   Resp 16   LMP 11/24/2014   SpO2 96%  Physical Exam General: NAD Cardiac: regular rate Lungs: equal chest rise Psych: calm  ED Course / MDM  EKG:EKG Interpretation Date/Time:  Saturday August 28 2023 13:22:14 EDT Ventricular Rate:  97 PR Interval:  178 QRS Duration:  84 QT Interval:  362 QTC Calculation: 459 R Axis:   76  Text Interpretation: Normal sinus rhythm Right atrial enlargement ST & T wave abnormality, consider inferolateral ischemia Abnormal ECG Compared to previous tracing nonspecific st changes are not new Confirmed by Alvino Blood ([id number]) on 08/28/2023 1:28:53 PM  I have reviewed the labs performed to date as well as medications administered while in observation.  Recent changes in the last 24 hours include presenting in ER for suicidal ideation. Has had elevated bg .  Plan  Current plan is for psychiatric observation. Have increased mealtime insulin dosing. Lonell Grandchild, MD 08/29/23 [phone]

## 2023-08-29 NOTE — ED Notes (Signed)
 PT crying and upset over elevated blood sugar, repeatedly states "I can't do this anymore" and "Nobody cares"

## 2023-08-29 NOTE — Inpatient Diabetes Management (Signed)
 Inpatient Diabetes Program Recommendations  AACE/ADA: New Consensus Statement on Inpatient Glycemic Control (2015)  Target Ranges:  Prepandial:   less than 140 mg/dL      Peak postprandial:   less than 180 mg/dL (1-2 hours)      Critically ill patients:  140 - 180 mg/dL   Lab Results  Component Value Date   GLUCAP 361 (H) 08/29/2023   HGBA1C 13.1 (H) 06/05/2023    Review of Glycemic Control  Latest Reference Range & Units 08/28/23 00:16 08/28/23 03:16 08/28/23 07:50 08/28/23 13:01 08/28/23 14:21 08/28/23 23:58 08/29/23 08:08  Glucose-Capillary 70 - 99 mg/dL 161 (H) 096 (H) 045 (H) 572 (HH) 462 (H) 380 (H) 361 (H)  (HH): Data is critically high (H): Data is abnormally high  Diabetes history: DM1(does not make insulin.  Needs correction, basal and meal coverage)  Outpatient Diabetes medications: Tresiba 10 units every day, Humalog 8 units TID  Current orders for Inpatient glycemic control: Semglee 20 units every day, Novolog 13 unit TID with meals  Inpatient Diabetes Program Recommendations:    Please consider:  Novolog 0-9 units TID and 0-5 units at bedtime Novolog 6 units TID with meals if she consumes at least 50%  Inpatient DM team is familiar with this patient.  Spoke with her on 07/19/23 regarding DM management.    Will continue to follow while inpatient.  Thank you, Dulce Sellar, MSN, CDCES Diabetes Coordinator Inpatient Diabetes Program [phone] (team pager from 8a-5p)

## 2023-08-30 DIAGNOSIS — Z5900 Homelessness unspecified: Secondary | ICD-10-CM

## 2023-08-30 DIAGNOSIS — F32A Depression, unspecified: Secondary | ICD-10-CM | POA: Diagnosis not present

## 2023-08-30 LAB — CBG MONITORING, ED
Glucose-Capillary: 226 mg/dL — ABNORMAL HIGH (ref 70–99)
Glucose-Capillary: 336 mg/dL — ABNORMAL HIGH (ref 70–99)

## 2023-08-30 MED ORDER — INSULIN ASPART 100 UNIT/ML IJ SOLN
0.0000 [IU] | Freq: Three times a day (TID) | INTRAMUSCULAR | Status: DC
Start: 2023-08-30 — End: 2023-09-13

## 2023-08-30 MED ORDER — INSULIN GLARGINE-YFGN 100 UNIT/ML ~~LOC~~ SOLN: 15.0000 [IU] | Freq: Every day | SUBCUTANEOUS | Status: DC

## 2023-08-30 MED ORDER — INSULIN GLARGINE-YFGN 100 UNIT/ML ~~LOC~~ SOLN
15.0000 [IU] | Freq: Every day | SUBCUTANEOUS | Status: DC
  Administered 2023-08-30: 15 [IU] via SUBCUTANEOUS
  Filled 2023-08-30: qty 0.15

## 2023-08-30 NOTE — ED Provider Notes (Signed)
 The patient has been cleared by psychiatry.  She is requesting discharge.  She does need a refill on her insulin which I have provided.  She is discharged with return precautions.   Durwin Glaze, MD 08/30/23 1726

## 2023-08-30 NOTE — ED Provider Notes (Signed)
 Emergency Medicine Observation Re-evaluation Note  Courtney Bennett is a 58 y.o. female, seen on rounds today.  Pt initially presented to the ED for complaints of Depression Currently, the patient is sitting in side of her bed drinking her morning coffee without acute agitation or distress.  Physical Exam  BP (!) 153/89 (BP Location: Left Arm)   Pulse 94   Temp 98.1 F (36.7 C) (Oral)   Resp 16   LMP 11/24/2014   SpO2 96%  Physical Exam General: Sitting without acute agitation or distress, just had breakfast Cardiac: No murmur on my initial auscultation Lungs: Clear bilaterally Psych: No agitation at this time  ED Course / MDM  EKG:EKG Interpretation Date/Time:  Saturday August 28 2023 13:22:14 EDT Ventricular Rate:  97 PR Interval:  178 QRS Duration:  84 QT Interval:  362 QTC Calculation: 459 R Axis:   76  Text Interpretation: Normal sinus rhythm Right atrial enlargement ST & T wave abnormality, consider inferolateral ischemia Abnormal ECG Compared to previous tracing nonspecific st changes are not new Confirmed by Alvino Blood ([id number]) on 08/28/2023 1:28:53 PM  I have reviewed the labs performed to date as well as medications administered while in observation.  Recent changes in the last 24 hours include none reported by overnight nursing.  Plan  Current plan is for awaiting psychiatric for further recommendations.   Fritzi Scripter, Canary Brim, MD 08/30/23 [phone]

## 2023-08-30 NOTE — Consult Note (Signed)
 [facility] Psychiatric Consult follow up  Patient Name: .Courtney Bennett  MRN: [id number]  DOB: 03-20-1966  Consult Order details:  Orders (From admission, onward)     Start     Ordered   08/28/23 0336  CONSULT TO CALL ACT TEAM       Ordering Provider: Darrick Grinder, PA-C  Provider:  (Not yet assigned)  Question:  Reason for Consult?  Answer:  Suicidal ideations/depression   08/28/23 0335             Mode of Visit: In person    Psychiatry Consult Evaluation  Service Date: August 30, 2023 LOS: 3 days Chief Complaint  food and housing insecurities  Primary Psychiatric Diagnoses  Homelessness Depression SI   Assessment  Courtney Bennett is a 58 y.o. female admitted: Presented to the ED for 08/28/2023 12:15 AM for depression and suicidal ideation . She carries the psychiatric diagnoses of Bipolar affective disorder, depression and anxiety has a past medical history of bradycardia, hypertension, COPD, acute respiratory failure, diabetes and  tobacco dependency  Her current presentation of worsening depression is most consistent with her current situation of financial stress and housing instability. She does not meets criteria for inpatient psychiatric tx.  Current  no outpatient psychotropic medications are documented. Please see plan below for detailed recommendations.   Diagnoses:  Active Hospital problems: Principal Problem:   Homeless Active Problems:   Suicidal ideation    Plan   ## Psychiatric Medication Recommendations:  - Continue Zoloft 25 mg daily  ## Medical Decision Making Capacity: Not specifically addressed in this encounter  ## Further Work-up:  -- CBGs, glucose level under 250 EKG -- most recent EKG- pending  -- Pertinent labwork reviewed earlier this admission includes: CBGs repeat EKG   ## Disposition:-- no psychiatric barriers to discharge  ## Behavioral / Environmental: - No specific recommendations at this time.     ## Safety and  Observation Level:  - Based on my clinical evaluation, I estimate the patient to be at low risk of self harm in the current setting. - At this time, we recommend  routine. This decision is based on my review of the chart including patient's history and current presentation, interview of the patient, mental status examination, and consideration of suicide risk including evaluating suicidal ideation, plan, intent, suicidal or self-harm behaviors, risk factors, and protective factors. This judgment is based on our ability to directly address suicide risk, implement suicide prevention strategies, and develop a safety plan while the patient is in the clinical setting. Please contact our team if there is a concern that risk level has changed.  CSSR Risk Category:C-SSRS RISK CATEGORY: Low Risk  Suicide Risk Assessment: Patient has following modifiable risk factors for suicide: untreated depression, social isolation, and medication noncompliance, which we are addressing by initiating antidepressants. Patient has following non-modifiable or demographic risk factors for suicide: psychiatric hospitalization Patient has the following protective factors against suicide: Cultural, spiritual, or religious beliefs that discourage suicide and Frustration tolerance  Thank you for this consult request. Recommendations have been communicated to the primary team.  We will psychiatrically clear the patient at this time.   Eligha Bridegroom, NP       History of Present Illness  Relevant Aspects of Hospital ED Course:  Admitted on 08/28/2023 for suicidal ideation due to multiple psychosocial stressors.    Courtney Bennett 58 year old female presents to the emergency department with multiple complaints related to dermatological issues.  States she is  homeless and has multiple sores on her body.  States she was recently seen and evaluated at [facility] and had to stop antibiotics because she was unable to get the medications  that was prescribed to her.  States she is currently homeless and is unable to take her diabetes medications as indicated.  States she is needing help with housing.  States she was follow-up by the [facility], partner of ending homelessness and [address].  States everything is a long process.  "  I am tired of trying" reports she has spoken to multiple case management and social workers throughout the hospital facilities without resolution.  Reports she never received any resources through [facility] to which she reports is the reason why the [facility] is no longer helping her.  Reports passive suicidal ideations denied plan or intent.  Denied illicit drug use.  Reports limited family support.  Pt seen at [facility] for face to face psychiatric reevaluation. Today, patient continues to have minimal psychiatric complaints, and most of her concerns involve around not having insulin, food insecurities, housing instability, and not having enough disability money. Pt states she gets around 1200 a month in disability. She states "I mean getting a coffee at [employer] is around 7 bucks a day, and even just a meal is around 20 bucks. Times that by 30 days in the month and there goes my whole check." Pt was asked if she is buying expensive food/coffee and if she could budget better, this irritated the patient who stated "well I don't have a fridge or a stove I can't cook in my van." Pt stated she does not like to stay in "dirty places" which Is why she is limited on what shelters she stays at. She did report staying at [facility] for around 2 months, but again reported it was dirty.   Pt denies SI. States "I don't want to kill myself. But I will die soon if I keep not getting my insulin or keep having food insecurities, it will kill me eventually." Pt again confirmed she does not want to die, but is scared her diabetes will kill her eventually. However, while in the ED, patient has refused her insulin even when sliding scale  requires. Pt has no explanation when asked why she is refusing. She denies any hx of suicide attempts. Denies access to weapons or firearms. She again reports she does not want to kill herself. She denies HI. Denies AVH. Ultimately, pt states she just wants somewhere to stay, and wants her disability money increased. Spoke with patient in great lengths that her two requests are not going to be addressed by hospital admission. Explained inpatient could provide therapy to help build coping skills and resilience to stress, however the hospital can not help with providing housing, or increasing disability pay. Pt then stated "well there is no point in me really being here then." I did explain to patient I would call around to local shelters to try and find bed availability for her, in which she appreciated that. Pt stated "if the shelter isn't clean I am not staying there." Explained she will be provided with numerous resources to help assist with housing and food insecurities.     Patient has been observed overnight in the ED  and there has been no unsafe behavior observed.  While on unit she demonstrates no unsafe behavior and there is no psychiatric condition which is amendable to inpatient psychiatric hospitalization.  Patients' suicidal ideation is conditional  and used as a means of manipulation and/or attention seeking without true intention to harm herself.  Her behaviors are more consistent with someone who is acting in self-preservation, rather that someone who is acutely suicidal.  Given all that is stated above including the fact that the patient shows no signs of mania or psychosis, has a liner, coherent thought process throughout assessment the patient does not meet criteria for inpatient psychiatric hospitalization or further psychiatric evaluation at this time.  Patient would benefit from outpatient psychiatric services which resources will be provided along with community resources prior to discharge.     Patient ongoing endorsement of suicidal ideation shows clear evidence of secondary gain of unmet needs that is representative of limited and often- maladaptive coping skills and rather than an indicator of imminent risk of death.  Evidence indicates that subsequent suicide attempts by patients who made contingent suicide threats (defined as threatened suicide or exaggerated suicidality) are uncommon in both groups.  Hospitalization should not be used as a substitute for social services, substance abuse treatment, and legal assistance for patients who make contingent suicide threats (Characteristics and six-month outcome of patients who use suicide threats to seek hospital admission.  (1966). Psychiatric Services, 47(8), [phone]. (DOI: 10.1176/ps.47.8.871).     Psychiatric hospitalization is not indicated, and the patient's refusal of interventions that have been offered by clinical care teams during prior stays in the ED, UC and during psychiatric hospitalization to mitigate risk of self-harm, other than allowing care team to observe to sobriety or behavior.  This provider and Dr. Woodroe Mode have discussed assessment and treatment recommendations with patient.  Further we see no evidence of severe psychosis, cognitive impairment, intoxication, or other condition that prevents the patient from acting under their own choice and volition.       -TOC consult placed to address/support additional housing concerns  - I reached out to numerous shelters to help assist patient with finding bed availability. [facility] no current availability, and gave me the number of a partner they work with, Wallis Bamberg, at [phone] who offers shared housing. Inifnity stated the patient sounds like a good match for their possible housing, however she will be calling back around 1500 to discuss details. Will provide her number to the patient to continue contact.   - TOC also was contacted by [facility] who states they  also have an available bed for the patient starting today. ED will provide cab ride to preferred destination of patient.   Psych ROS:  Depression: Due to multiple psychosocial stressors rating her depression 8 out of 10 with 10 being the worst Anxiety: Reports mild anxiety symptoms Mania (lifetime and current): Denied Psychosis: (lifetime and current): Denied  Collateral information:  N/A  Review of Systems  Psychiatric/Behavioral:  Positive for depression. Negative for suicidal ideas. The patient is nervous/anxious.      Psychiatric and Social History  Psychiatric History:  Information collected from patient   Prev Dx/Sx: Depression and anxiety suicidal ideations Current Psych Provider: Denied Home Meds (current): Denied Previous Med Trials: Unable to recall the names psychotropic medications Therapy: denied   Prior Psych Hospitalization: Previous history Prior Self Harm: Denied Prior Violence: denied   Family Psych History:  Family Hx suicide:   Social History:  Developmental Hx: n/A Educational Hx: N/A Occupational Hx: Disabled Legal Hx: Denied Living Situation: Homeless Spiritual Hx:  Access to weapons/lethal means: Denied  Substance History   Exam Findings  Physical Exam:  Vital Signs:  Blood pressure (!) 153/89, pulse 94, temperature 98.1 F (36.7 C), temperature source Oral, resp. rate 16, last menstrual period 11/24/2014, SpO2 96%. There is no height or weight on file to calculate BMI.  Physical Exam Vitals and nursing note reviewed.  Neurological:     Mental Status: She is alert and oriented to person, place, and time.     Mental Status Exam: General Appearance: fairly groomed  Orientation:  Full (Time, Place, and Person)  Memory:  Immediate;   Good Recent;   Good  Concentration:  Concentration: Good  Recall:  Good  Attention  Fair  Eye Contact:  Good  Speech:  Clear and Coherent  Language:  Good  Volume:  Normal  Mood: Congruent   Affect:  Congruent  Thought Process:  Coherent  Thought Content:  Logical  Suicidal Thoughts: none  Homicidal Thoughts:  No  Judgement:  Fair  Insight:  Fair  Psychomotor Activity:  Normal  Akathisia:  No  Fund of Knowledge:  fair      Assets:  Communication Skills Desire for Improvement  Cognition:  WNL  ADL's:  Intact  AIMS (if indicated):        Other History   These have been pulled in through the EMR, reviewed, and updated if appropriate.  Family History:  The patient's family history includes Breast cancer in her mother and paternal grandmother; Diabetes in her father.  Medical History: Past Medical History:  Diagnosis Date   Bipolar 1 disorder (HCC)    COPD (chronic obstructive pulmonary disease) (HCC)    Diabetes mellitus    Fibromyalgia    Grade I diastolic dysfunction 06/06/2023   Hypertension     Surgical History: Past Surgical History:  Procedure Laterality Date   ABDOMINAL HYSTERECTOMY  01/2019   BREAST BIOPSY Right    BREAST BIOPSY Right 02/18/2023   [place] RT BREAST BX W LOC DEV 1ST LESION IMG BX SPEC US GUIDE 02/18/2023 [facility] MAMMOGRAPHY   BRONCHIAL BIOPSY  07/09/2023   Procedure: BRONCHIAL BIOPSIES;  Surgeon: Luciano Cutter, MD;  [facility];  Service: Cardiopulmonary;;   BRONCHIAL BRUSHINGS  07/09/2023   Procedure: BRONCHIAL BRUSHINGS;  Surgeon: Luciano Cutter, MD;  Location: [facility];  Service: Cardiopulmonary;;   BRONCHIAL WASHINGS  07/09/2023   Procedure: BRONCHIAL WASHINGS;  Surgeon: Luciano Cutter, MD;  Location: [facility];  Service: Cardiopulmonary;;   CESAREAN SECTION     HEMOSTASIS CONTROL  07/09/2023   Procedure: HEMOSTASIS CONTROL;  Surgeon: Luciano Cutter, MD;  Location: [facility];  Service: Cardiopulmonary;;   HERNIA REPAIR     VIDEO BRONCHOSCOPY N/A 07/09/2023   Procedure: VIDEO BRONCHOSCOPY WITH FLUORO;  Surgeon: Luciano Cutter, MD;  Location: [facility];  Service: Cardiopulmonary;  Laterality: N/A;      Medications:   Current Facility-Administered Medications:    albuterol (VENTOLIN HFA) 108 (90 Base) MCG/ACT inhaler 1-2 puff, 1-2 puff, Inhalation, Q6H PRN, Marcelline Deist, Larry B, PA-C   bacitracin ointment, , Topical, BID, Lonell Grandchild, MD, 1 Application at 08/30/23 1051   barrier cream (non-specified) 1 Application, 1 Application, Topical, BID PRN, Barrie Dunker B, PA-C   insulin aspart (novoLOG) injection 0-5 Units, 0-5 Units, Subcutaneous, QHS, Scheving, William L, MD   insulin aspart (novoLOG) injection 0-9 Units, 0-9 Units, Subcutaneous, TID WC, Lonell Grandchild, MD, 7 Units at 08/30/23 1237   insulin aspart (novoLOG) injection 6 Units, 6 Units, Subcutaneous, TID WC, Lonell Grandchild, MD, 6 Units at 08/30/23 1236   insulin  glargine-yfgn (SEMGLEE) injection 20 Units, 20 Units, Subcutaneous, Daily, Darrick Grinder, PA-C, 20 Units at 08/29/23 1213   sertraline (ZOLOFT) tablet 25 mg, 25 mg, Oral, Daily, Oneta Rack, NP, 25 mg at 08/30/23 1051  Current Outpatient Medications:    albuterol (VENTOLIN HFA) 108 (90 Base) MCG/ACT inhaler, Inhale 1-2 puffs into the lungs every 6 (six) hours as needed. (Patient taking differently: Inhale 1-2 puffs into the lungs every 6 (six) hours as needed for wheezing or shortness of breath.), Disp: 18 g, Rfl: 0   alum & mag hydroxide-simeth (MAALOX/MYLANTA) 200-200-20 MG/5ML suspension, Take 30 mLs by mouth every 4 (four) hours as needed for indigestion, heartburn or flatulence., Disp: , Rfl:    amLODipine (NORVASC) 10 MG tablet, Take 1 tablet (10 mg total) by mouth daily. (Patient not taking: Reported on 07/07/2023), Disp: 30 tablet, Rfl: 1   fluticasone (FLONASE) 50 MCG/ACT nasal spray, Place 2 sprays into both nostrils daily for 14 days., Disp: , Rfl:    insulin aspart (NOVOLOG) 100 UNIT/ML injection, Inject 0-9 Units into the skin 3 (three) times daily with meals., Disp: , Rfl:    insulin aspart (NOVOLOG) 100 UNIT/ML injection, Inject 10  Units into the skin 3 (three) times daily after meals., Disp: , Rfl:    insulin glargine-yfgn (SEMGLEE) 100 UNIT/ML injection, Inject 0.2 mLs (20 Units total) into the skin daily., Disp: , Rfl:    LANTUS SOLOSTAR 100 UNIT/ML Solostar Pen, Inject into the skin., Disp: , Rfl:    losartan (COZAAR) 25 MG tablet, Take 1 tablet (25 mg total) by mouth daily., Disp: , Rfl:    mupirocin ointment (BACTROBAN) 2 %, Apply topically., Disp: , Rfl:    nitroGLYCERIN (NITROSTAT) 0.4 MG SL tablet, Place 1 tablet (0.4 mg total) under the tongue every 5 (five) minutes as needed for chest pain., Disp: 25 tablet, Rfl: 0   polyethylene glycol (MIRALAX / GLYCOLAX) 17 g packet, Take 17 g by mouth daily as needed for mild constipation., Disp: , Rfl:    Throat Lozenges (COUGH DROPS MENTHOL) LOZG, Use as directed 1 lozenge in the mouth or throat every 2 (two) hours as needed (for a sore throat)., Disp: , Rfl:    umeclidinium bromide (INCRUSE ELLIPTA) 62.5 MCG/ACT AEPB, Inhale 1 puff into the lungs daily., Disp: 30 each, Rfl: 0  Allergies: Allergies  Allergen Reactions   Nickel Hives, Rash and Other (See Comments)    Rash after ear piercing and navel piercings      Oxycodone-Acetaminophen Nausea And Vomiting   Metformin Diarrhea   Other Nausea And Vomiting and Other (See Comments)    Opiates- "They make me very sick."   Elemental Sulfur Hives, Rash and Other (See Comments)    "Burns the skin," also (and, had a "flu-like reaction"   Lamictal [Lamotrigine] Rash   Latex Itching and Rash   Sulfa Antibiotics Rash   Tape Rash and Other (See Comments)    Irritates the skin    Eligha Bridegroom, NP

## 2023-08-30 NOTE — ED Notes (Signed)
 Pt requested to speak with diabetes coordinator. This RN requested ED secretary to page inpatient coordinator.

## 2023-08-30 NOTE — ED Notes (Signed)
 Pt refusing 0800 insulin. Pt states she does not want breakfast because "it's the same thing I eat every day, I'm sick of it." Pt is tearful. This RN encouraged pt to eat breakfast and take her insulin with no success.

## 2023-08-30 NOTE — Discharge Instructions (Addendum)
 PLEASE CALL Courtney Bennett [phone] - SHE IS ABLE TO HELP COORDINATE HOUSING SOLUTIONS FOR YOU.     Homeless Shelter List:  Health and safety inspector [facility] [place]) [address] [place], [place]  Phone: [phone]  [facility] [address] [place], [place] [address] Phone: [phone]  [facility] (Women only) [address]Cyril Loosen [place], [place] [address] Phone: [phone]  [facility] [address], [place] [address] Phone: [phone]  [facility]: [phone]. [address] [place], [place] [address] Phone: [phone]  [facility]  [address], [place], [place] [address] (Check in at 6:00PM for placement at a local shelter) Phone: [phone]  [facility]) Monday - Friday 8am - 3pm          Sat & Sun 8am - 2pm [address] [place], [place] [address]   [phone]     www.[facility].org [facility] offers among other critical resources: showers, laundry, barbershop, phone bank, mailroom, computer lab, medical clinic, gardens and a bike maintenance area.   AREA SHELTERS  [facility]  (Men & women) [address] [place] [phone]  [facility] [place] (Men/women/families) [address] [place] [phone]   [facility] (Families with children) [phone] [address]  [place] [phone]   [facility] (Domestic Violence Shelter) [address]  [place] [phone]   [facility] (Children ages 39-17) [address]  [place] [phone]   [facility]   (Women & children) [address] [place] [phone]   [facility] (Women/substance abuse) [address]  [place] [phone]   [facility] (Men) [address]  [place] [phone]   [facility]  (Men) [address]  [place] [phone]  [employer] (Women) [phone] [address]  [place] [phone]   [facility] (Single women & women with children) [address]  [place]ra [phone]  [facility] (Men/women/families) [address] [phone]    [facility]   (Domestic Violence shelter) [address]  [place] [phone]   [facility] (Men & women) [address] [place]  [place] [phone]  [facility] (Men) [address] [place]  [employer] [phone]   [facility] (Men) [address]  [place] [phone]   [facility] (Single women & families) [address]  [place] [phone]  [facility] (Men/women & families) [address]  [place] [phone]    If you are at risk of losing your housing (throughout [facility]) call the Housing Hotline at (450)552-3543. You may also contact 2-1-1, a FREE service of the [address] that provides information about many resources including housing. Dial 211, or visit online at PooledIncome.pl. [facility]:  House of [place], contact Janelle Floor [phone] (men only)                          [facility] in [place]:  90 day homeless program for women and men;                             contact Rev. Chambers [phone]  [facility]:  [facility]:  men/women/children [phone]  [facility]:  Shelter in [place], Renato G[place] [phone]  [employer] in [place], [place] [phone]   [facility]:  [facility]used Women, [phone]; (women and children)  [facility]:  [employer], men/women/children; [phone]                           [facility] in [place], [phone]; substance abuse halfway house for men              [facility]; 4 bedroom house in [facility] for homeless women,  contact Jenne Campus [phone]               [facility] in [place], [phone] (women and children)               [facility], for abused women and children, 24 hour crisis line, [phone], Jamison Oka  [facility], halfway house for women, [place], [phone]  [facility]:  [facility], [phone]; open Mon-Thurs from TKZ60 - March 15 when temp is below 32 degrees                              [facility]; [place], [phone]; cell [phone]; open 24/7  [facility]  Outreach for [place] - Rev Ladona Ridgel - [phone]  [place]:  transitional housing for women and children; [place] [phone]

## 2023-08-30 NOTE — Inpatient Diabetes Management (Addendum)
 Inpatient Diabetes Program Recommendations  AACE/ADA: New Consensus Statement on Inpatient Glycemic Control (2015)  Target Ranges:  Prepandial:   less than 140 mg/dL      Peak postprandial:   less than 180 mg/dL (1-2 hours)      Critically ill patients:  140 - 180 mg/dL   Lab Results  Component Value Date   GLUCAP 336 (H) 08/30/2023   HGBA1C 13.1 (H) 06/05/2023    Review of Glycemic Control  Latest Reference Range & Units 08/29/23 14:13 08/29/23 17:32 08/30/23 07:46 08/30/23 12:32  Glucose-Capillary 70 - 99 mg/dL 696 (H) 295 (H) 284 (H) 336 (H)  (H): Data is abnormally high Diabetes history: DM1(does not make insulin.  Needs correction, basal and meal coverage)   Outpatient Diabetes medications: Tresiba 10 units every day, Humalog 8 units TID   Current orders for Inpatient glycemic control: Semglee 20 units every day, Novolog 6 unit TID with meals, Novolog 0-9 units TID & HS   Inpatient Diabetes Program Recommendations:    Spoke with patient regarding request. Per RN, patient refusing insulin.  During discussion, patient feels that she has too much basal ordered compared to home doses. Requesting that insulin dosing be changed.  Reviewed patient's current A1c of 13.1%. Explained what a A1c is and what it measures. Also reviewed goal A1c with patient, importance of good glucose control @ home, and blood sugar goals. Additionally, discussed current glucose trends, previous hospitalizations and trends within that time compared to insulin doses. Encouraged consistent doses as able. Reviewed the importance of trying to incorporating protein as she can into diet. Patient asking very specific questions regarding total carbohydrates and fiber. Refocused patient on the importance of ensuring insulin administration while outpatient and eating are priorities.  Per patient request, patient is willing to take Semglee 15 units every day. Secure chat sent to MD.   Of note, at discharge, would order  ONE pen at a time to help reduce likelihood of expiration.   Thanks, Lujean Rave, MSN, RNC-OB Diabetes Coordinator [phone] (8a-5p)

## 2023-09-09 ENCOUNTER — Ambulatory Visit: Admitting: Adult Health

## 2023-09-10 ENCOUNTER — Encounter: Payer: Self-pay | Admitting: Adult Health

## 2023-09-13 ENCOUNTER — Emergency Department (HOSPITAL_COMMUNITY)
Admission: EM | Admit: 2023-09-13 | Discharge: 2023-09-13 | Disposition: A | Discharge status: Home / Self Care | Attending: Emergency Medicine | Admitting: Emergency Medicine

## 2023-09-13 ENCOUNTER — Other Ambulatory Visit (HOSPITAL_COMMUNITY): Payer: Self-pay

## 2023-09-13 ENCOUNTER — Emergency Department (HOSPITAL_COMMUNITY)

## 2023-09-13 ENCOUNTER — Other Ambulatory Visit: Payer: Self-pay

## 2023-09-13 ENCOUNTER — Encounter (HOSPITAL_COMMUNITY): Payer: Self-pay

## 2023-09-13 DIAGNOSIS — Z5900 Homelessness unspecified: Secondary | ICD-10-CM | POA: Insufficient documentation

## 2023-09-13 DIAGNOSIS — Z87891 Personal history of nicotine dependence: Secondary | ICD-10-CM | POA: Diagnosis not present

## 2023-09-13 DIAGNOSIS — E1165 Type 2 diabetes mellitus with hyperglycemia: Secondary | ICD-10-CM

## 2023-09-13 DIAGNOSIS — Z91040 Latex allergy status: Secondary | ICD-10-CM | POA: Diagnosis not present

## 2023-09-13 DIAGNOSIS — I10 Essential (primary) hypertension: Secondary | ICD-10-CM | POA: Diagnosis not present

## 2023-09-13 DIAGNOSIS — Z91148 Patient's other noncompliance with medication regimen for other reason: Secondary | ICD-10-CM | POA: Insufficient documentation

## 2023-09-13 DIAGNOSIS — J449 Chronic obstructive pulmonary disease, unspecified: Secondary | ICD-10-CM | POA: Insufficient documentation

## 2023-09-13 DIAGNOSIS — E1065 Type 1 diabetes mellitus with hyperglycemia: Secondary | ICD-10-CM | POA: Insufficient documentation

## 2023-09-13 DIAGNOSIS — Z79899 Other long term (current) drug therapy: Secondary | ICD-10-CM | POA: Diagnosis not present

## 2023-09-13 DIAGNOSIS — R739 Hyperglycemia, unspecified: Secondary | ICD-10-CM | POA: Diagnosis present

## 2023-09-13 LAB — CBC WITH DIFFERENTIAL/PLATELET
Abs Immature Granulocytes: 0.04 10*3/uL (ref 0.00–0.07)
Basophils Absolute: 0.1 10*3/uL (ref 0.0–0.1)
Basophils Relative: 0 %
Eosinophils Absolute: 0.2 10*3/uL (ref 0.0–0.5)
Eosinophils Relative: 2 %
HCT: 51.5 % — ABNORMAL HIGH (ref 36.0–46.0)
Hemoglobin: 17.1 g/dL — ABNORMAL HIGH (ref 12.0–15.0)
Immature Granulocytes: 0 %
Lymphocytes Relative: 13 %
Lymphs Abs: 1.5 10*3/uL (ref 0.7–4.0)
MCH: 32.1 pg (ref 26.0–34.0)
MCHC: 33.2 g/dL (ref 30.0–36.0)
MCV: 96.8 fL (ref 80.0–100.0)
Monocytes Absolute: 0.9 10*3/uL (ref 0.1–1.0)
Monocytes Relative: 8 %
Neutro Abs: 8.9 10*3/uL — ABNORMAL HIGH (ref 1.7–7.7)
Neutrophils Relative %: 77 %
Platelets: 253 10*3/uL (ref 150–400)
RBC: 5.32 MIL/uL — ABNORMAL HIGH (ref 3.87–5.11)
RDW: 12.8 % (ref 11.5–15.5)
WBC: 11.5 10*3/uL — ABNORMAL HIGH (ref 4.0–10.5)
nRBC: 0 % (ref 0.0–0.2)

## 2023-09-13 LAB — I-STAT VENOUS BLOOD GAS, ED
Acid-Base Excess: 4 mmol/L — ABNORMAL HIGH (ref 0.0–2.0)
Bicarbonate: 29.7 mmol/L — ABNORMAL HIGH (ref 20.0–28.0)
Calcium, Ion: 1.08 mmol/L — ABNORMAL LOW (ref 1.15–1.40)
HCT: 51 % — ABNORMAL HIGH (ref 36.0–46.0)
Hemoglobin: 17.3 g/dL — ABNORMAL HIGH (ref 12.0–15.0)
O2 Saturation: 42 %
Potassium: 4.3 mmol/L (ref 3.5–5.1)
Sodium: 135 mmol/L (ref 135–145)
TCO2: 31 mmol/L (ref 22–32)
pCO2, Ven: 48.6 mmHg (ref 44–60)
pH, Ven: 7.395 (ref 7.25–7.43)
pO2, Ven: 24 mmHg — CL (ref 32–45)

## 2023-09-13 LAB — COMPREHENSIVE METABOLIC PANEL WITH GFR
ALT: 11 U/L (ref 0–44)
AST: 17 U/L (ref 15–41)
Albumin: 3.5 g/dL (ref 3.5–5.0)
Alkaline Phosphatase: 66 U/L (ref 38–126)
Anion gap: 10 (ref 5–15)
BUN: 11 mg/dL (ref 6–20)
CO2: 25 mmol/L (ref 22–32)
Calcium: 8.6 mg/dL — ABNORMAL LOW (ref 8.9–10.3)
Chloride: 100 mmol/L (ref 98–111)
Creatinine, Ser: 0.5 mg/dL (ref 0.44–1.00)
GFR, Estimated: >60 mL/min (ref >60)
Glucose, Bld: 440 mg/dL — ABNORMAL HIGH (ref 70–99)
Potassium: 4.6 mmol/L (ref 3.5–5.1)
Sodium: 135 mmol/L (ref 135–145)
Total Bilirubin: 1.1 mg/dL (ref 0.0–1.2)
Total Protein: 5.9 g/dL — ABNORMAL LOW (ref 6.5–8.1)

## 2023-09-13 LAB — CBG MONITORING, ED
Glucose-Capillary: 477 mg/dL — ABNORMAL HIGH (ref 70–99)
Glucose-Capillary: 386 mg/dL — ABNORMAL HIGH (ref 70–99)
Glucose-Capillary: 263 mg/dL — ABNORMAL HIGH (ref 70–99)
Glucose-Capillary: 287 mg/dL — ABNORMAL HIGH (ref 70–99)

## 2023-09-13 LAB — BASIC METABOLIC PANEL WITH GFR
Anion gap: 10 (ref 5–15)
BUN: 11 mg/dL (ref 6–20)
CO2: 26 mmol/L (ref 22–32)
Calcium: 8.6 mg/dL — ABNORMAL LOW (ref 8.9–10.3)
Chloride: 97 mmol/L — ABNORMAL LOW (ref 98–111)
Creatinine, Ser: 0.5 mg/dL (ref 0.44–1.00)
GFR, Estimated: >60 mL/min (ref >60)
Glucose, Bld: 450 mg/dL — ABNORMAL HIGH (ref 70–99)
Potassium: 4.5 mmol/L (ref 3.5–5.1)
Sodium: 133 mmol/L — ABNORMAL LOW (ref 135–145)

## 2023-09-13 LAB — URINALYSIS, ROUTINE W REFLEX MICROSCOPIC
Bacteria, UA: NONE SEEN
Bilirubin Urine: NEGATIVE
Glucose, UA: >=500 mg/dL — AB
Hgb urine dipstick: NEGATIVE
Ketones, ur: 20 mg/dL — AB
Leukocytes,Ua: NEGATIVE
Nitrite: NEGATIVE
Protein, ur: NEGATIVE mg/dL
Specific Gravity, Urine: 1.03 (ref 1.005–1.030)
pH: 6 (ref 5.0–8.0)

## 2023-09-13 LAB — BETA-HYDROXYBUTYRIC ACID: Beta-Hydroxybutyric Acid: 1.52 mmol/L — ABNORMAL HIGH (ref 0.05–0.27)

## 2023-09-13 MED ORDER — AMLODIPINE BESYLATE 5 MG PO TABS
5.0000 mg | ORAL_TABLET | Freq: Every day | ORAL | 0 refills | Status: DC
  Filled 2023-09-13: qty 30, 30d supply, fill #0

## 2023-09-13 MED ORDER — INSULIN ASPART 100 UNIT/ML IJ SOLN
10.0000 [IU] | Freq: Once | INTRAMUSCULAR | Status: AC
  Administered 2023-09-13: 10 [IU] via INTRAVENOUS

## 2023-09-13 MED ORDER — ALBUTEROL SULFATE HFA 108 (90 BASE) MCG/ACT IN AERS
1.0000 | INHALATION_SPRAY | Freq: Four times a day (QID) | RESPIRATORY_TRACT | 0 refills | Status: AC | PRN
  Filled 2023-09-13 (×2): qty 18, 25d supply, fill #0

## 2023-09-13 MED ORDER — ALBUTEROL SULFATE HFA 108 (90 BASE) MCG/ACT IN AERS
1.0000 | INHALATION_SPRAY | Freq: Four times a day (QID) | RESPIRATORY_TRACT | 0 refills | Status: DC | PRN
  Filled 2023-09-13: qty 1, fill #0

## 2023-09-13 MED ORDER — AMLODIPINE BESYLATE 10 MG PO TABS
10.0000 mg | ORAL_TABLET | Freq: Every day | ORAL | 0 refills | Status: AC
  Filled 2023-09-13: qty 30, 30d supply, fill #0

## 2023-09-13 MED ORDER — SODIUM CHLORIDE 0.9 % IV BOLUS
1000.0000 mL | Freq: Once | INTRAVENOUS | Status: AC
  Administered 2023-09-13: 1000 mL via INTRAVENOUS

## 2023-09-13 MED ORDER — IBUPROFEN 600 MG PO TABS
600.0000 mg | ORAL_TABLET | Freq: Four times a day (QID) | ORAL | 0 refills | Status: AC | PRN
  Filled 2023-09-13: qty 30, 8d supply, fill #0

## 2023-09-13 MED ORDER — ACETAMINOPHEN 325 MG PO TABS
650.0000 mg | ORAL_TABLET | Freq: Four times a day (QID) | ORAL | 0 refills | Status: AC | PRN
  Filled 2023-09-13: qty 36, 5d supply, fill #0

## 2023-09-13 NOTE — ED Provider Notes (Signed)
  EMERGENCY DEPARTMENT AT [facility] Provider Note  CSN: [id number] Arrival date & time: 09/13/23 0347  Chief Complaint(s) Hyperglycemia  HPI Courtney Bennett is a 58 y.o. female with past medical history as below, significant for COPD, DM, htn, BP1D who presents to the ED with complaint of fall, +glucose.   Pt reports she tripped over object on the ground, stumbled to the ground. No head injury or LOC. She was ambulatory after the fall. No abd pain/headache/ no loc or vomiting.   She has some pain to her left-sided chest wall after falling, no difficulty breathing  Patient also reports she has not taken her insulin for some time, she reports she is scheduled follow-up with her PCP soon  Past Medical History Past Medical History:  Diagnosis Date   Bipolar 1 disorder (HCC)    COPD (chronic obstructive pulmonary disease) (HCC)    Diabetes mellitus    Fibromyalgia    Grade I diastolic dysfunction 06/06/2023   Hypertension    Patient Active Problem List   Diagnosis Date Noted   Pneumothorax 07/14/2023   Community acquired pneumonia of left upper lobe of lung 07/06/2023   Suicidal ideation 06/09/2023   Uncontrolled type 2 diabetes mellitus with hyperglycemia (HCC) 06/06/2023   Insect bites 06/06/2023   Grade I diastolic dysfunction 06/06/2023   Acute respiratory failure with hypoxemia (HCC) 03/15/2023   COPD with acute exacerbation (HCC) 03/14/2023   Acute respiratory failure with hypoxia (HCC) 03/14/2023   Protein-calorie malnutrition, severe 12/31/2022   Atypical chest pain 12/29/2022   Essential hypertension 12/29/2022   DM (diabetes mellitus) (HCC) 12/01/2022   Gastric distention 11/30/2022   Hav (hallux abducto valgus), unspecified laterality 11/04/2022   COPD (chronic obstructive pulmonary disease) (HCC) 01/22/2022   Homeless 01/22/2022   Ulcer of right foot (HCC) 01/22/2022   Tobacco dependence 12/27/2019   Aspiration pneumonia (HCC) 12/10/2019    Paroxysmal atrial flutter (HCC) 12/10/2019   Aspiration into airway 12/10/2019   Fibromyalgia 02/27/2019   Hx of unilateral oophorectomy 01/31/2019   Bipolar affective disorder, current episode hypomanic (HCC) 08/10/2014   Paranoid (HCC) 08/10/2014   Home Medication(s) Prior to Admission medications   Medication Sig Start Date End Date Taking? Authorizing Provider  acetaminophen (TYLENOL) 325 MG tablet Take 2 tablets (650 mg total) by mouth every 6 (six) hours as needed. 09/13/23  Yes Russella Courts A, DO  ibuprofen (ADVIL) 600 MG tablet Take 1 tablet (600 mg total) by mouth every 6 (six) hours as needed. 09/13/23  Yes Russella Courts A, DO  albuterol (VENTOLIN HFA) 108 (90 Base) MCG/ACT inhaler Inhale 1-2 puffs into the lungs every 6 (six) hours as needed. Patient not taking: Reported on 09/13/2023 06/09/23   Pokhrel, Laxman, MD  albuterol (VENTOLIN HFA) 108 (90 Base) MCG/ACT inhaler Inhale 1-2 puffs into the lungs every 6 (six) hours as needed for wheezing or shortness of breath. 09/13/23   Teddi Favors, DO  alum & mag hydroxide-simeth (MAALOX/MYLANTA) 200-200-20 MG/5ML suspension Take 30 mLs by mouth every 4 (four) hours as needed for indigestion, heartburn or flatulence. Patient not taking: Reported on 09/13/2023 07/21/23   Ree Candy, MD  amLODipine (NORVASC) 10 MG tablet Take 1 tablet (10 mg total) by mouth daily. 09/13/23   Teddi Favors, DO  HUMALOG KWIKPEN 200 UNIT/ML KwikPen Inject 8 Units into the skin in the morning, at noon, and at bedtime. Patient not taking: Reported on 09/13/2023 09/11/23   [provider]  LANTUS SOLOSTAR 100  UNIT/ML Solostar Pen Inject into the skin. Patient not taking: Reported on 09/13/2023 08/04/23   [provider]  losartan (COZAAR) 100 MG tablet Take 100 mg by mouth daily. Patient not taking: Reported on 09/13/2023    [provider]  mupirocin ointment (BACTROBAN) 2 % Apply topically. 07/27/23   [provider]   nitroGLYCERIN (NITROSTAT) 0.4 MG SL tablet Place 1 tablet (0.4 mg total) under the tongue every 5 (five) minutes as needed for chest pain. 06/09/23   Pokhrel, Rebekah [place], MD  polyethylene glycol (MIRALAX / GLYCOLAX) 17 g packet Take 17 g by mouth daily as needed for mild constipation. Patient not taking: Reported on 09/13/2023 07/21/23   Leeroy Bock, MD  umeclidinium bromide (INCRUSE ELLIPTA) 62.5 MCG/ACT AEPB Inhale 1 puff into the lungs daily. Patient not taking: Reported on 09/13/2023 03/22/23   Burnadette Pop, MD                                                                                                                                    Past Surgical History Past Surgical History:  Procedure Laterality Date   ABDOMINAL HYSTERECTOMY  01/2019   BREAST BIOPSY Right    BREAST BIOPSY Right 02/18/2023   [place] RT BREAST BX W LOC DEV 1ST LESION IMG BX SPEC US GUIDE 02/18/2023 [facility]   BRONCHIAL BIOPSY  07/09/2023   Procedure: BRONCHIAL BIOPSIES;  Surgeon: Luciano Cutter, MD;  Location: [facility];  Service: Cardiopulmonary;;   BRONCHIAL BRUSHINGS  07/09/2023   Procedure: BRONCHIAL BRUSHINGS;  Surgeon: Luciano Cutter, MD;  Location: [facility];  Service: Cardiopulmonary;;   BRONCHIAL WASHINGS  07/09/2023   Procedure: BRONCHIAL WASHINGS;  Surgeon: Luciano Cutter, MD;  Location: [facility];  Service: Cardiopulmonary;;   CESAREAN SECTION     HEMOSTASIS CONTROL  07/09/2023   Procedure: HEMOSTASIS CONTROL;  Surgeon: Luciano Cutter, MD;  Location: [facility];  Service: Cardiopulmonary;;   HERNIA REPAIR     VIDEO BRONCHOSCOPY N/A 07/09/2023   Procedure: VIDEO BRONCHOSCOPY WITH FLUORO;  Surgeon: Luciano Cutter, MD;  Location: [facility];  Service: Cardiopulmonary;  Laterality: N/A;   Family History Family History  Problem Relation Age of Onset   Diabetes Father    Breast cancer Mother    Breast cancer Paternal Grandmother     Social History Social History    Tobacco Use   Smoking status: Former    Current packs/day: 0.00    Types: Cigarettes    Quit date: 11/21/2014    Years since quitting: 8.8   Smokeless tobacco: Never  Substance Use Topics   Alcohol use: Yes    Comment: rare   Drug use: No   Allergies Nickel, Oxycodone-acetaminophen, Metformin, Other, Elemental sulfur, Lamictal [lamotrigine], Latex, Sulfa antibiotics, and Tape  Review of Systems A thorough review of systems was obtained and all systems are negative except as noted in the HPI and PMH.  Physical Exam Vital Signs  I have reviewed the triage vital signs BP (!) 180/80 (BP Location: Left Arm)   Pulse 97   Temp 97.8 F (36.6 C) (Oral)   Resp 17   LMP 11/24/2014   SpO2 94%  Physical Exam Vitals and nursing note reviewed.  Constitutional:      General: She is not in acute distress.    Appearance: Normal appearance.     Comments: Disheveled   HENT:     Head: Normocephalic and atraumatic.     Right Ear: External ear normal.     Left Ear: External ear normal.     Nose: Nose normal.     Mouth/Throat:     Mouth: Mucous membranes are moist.  Eyes:     General: No scleral icterus.       Right eye: No discharge.        Left eye: No discharge.  Cardiovascular:     Rate and Rhythm: Normal rate and regular rhythm.     Pulses: Normal pulses.     Heart sounds: Normal heart sounds.  Pulmonary:     Effort: Pulmonary effort is normal. No respiratory distress.     Breath sounds: Normal breath sounds. No stridor.  Abdominal:     General: Abdomen is flat. There is no distension.     Palpations: Abdomen is soft.     Tenderness: There is no abdominal tenderness.  Musculoskeletal:     Cervical back: No rigidity.     Right lower leg: No edema.     Left lower leg: No edema.     Comments: Pelvis stable AP pressure  No midline spinous process tenderness to palpation or percussion, no crepitus or step-off.    Moving extremities freely  LE NVI   Skin:    General:  Skin is warm and dry.     Capillary Refill: Capillary refill takes less than 2 seconds.  Neurological:     Mental Status: She is alert.  Psychiatric:        Mood and Affect: Mood normal.        Behavior: Behavior normal. Behavior is cooperative.     ED Results and Treatments Labs (all labs ordered are listed, but only abnormal results are displayed) Labs Reviewed  CBC WITH DIFFERENTIAL/PLATELET - Abnormal; Notable for the following components:      Result Value   WBC 11.5 (*)    RBC 5.32 (*)    Hemoglobin 17.1 (*)    HCT 51.5 (*)    Neutro Abs 8.9 (*)    All other components within normal limits  BASIC METABOLIC PANEL WITH GFR - Abnormal; Notable for the following components:   Sodium 133 (*)    Chloride 97 (*)    Glucose, Bld 450 (*)    Calcium 8.6 (*)    All other components within normal limits  URINALYSIS, ROUTINE W REFLEX MICROSCOPIC - Abnormal; Notable for the following components:   Color, Urine STRAW (*)    Glucose, UA >=500 (*)    Ketones, ur 20 (*)    All other components within normal limits  BETA-HYDROXYBUTYRIC ACID - Abnormal; Notable for the following components:   Beta-Hydroxybutyric Acid 1.52 (*)    All other components within normal limits  COMPREHENSIVE METABOLIC PANEL WITH GFR - Abnormal; Notable for the following components:   Glucose, Bld 440 (*)    Calcium 8.6 (*)    Total Protein 5.9 (*)    All other components within  normal limits  CBG MONITORING, ED - Abnormal; Notable for the following components:   Glucose-Capillary 477 (*)    All other components within normal limits  I-STAT VENOUS BLOOD GAS, ED - Abnormal; Notable for the following components:   pO2, Ven 24 (*)    Bicarbonate 29.7 (*)    Acid-Base Excess 4.0 (*)    Calcium, Ion 1.08 (*)    HCT 51.0 (*)    Hemoglobin 17.3 (*)    All other components within normal limits  CBG MONITORING, ED - Abnormal; Notable for the following components:   Glucose-Capillary 386 (*)    All other  components within normal limits  CBG MONITORING, ED - Abnormal; Notable for the following components:   Glucose-Capillary 263 (*)    All other components within normal limits  CBG MONITORING, ED - Abnormal; Notable for the following components:   Glucose-Capillary 287 (*)    All other components within normal limits  CBG MONITORING, ED                                                                                                                          Radiology DG Chest Portable 1 View Result Date: 09/13/2023 CLINICAL DATA:  Hyperglycemia EXAM: PORTABLE CHEST 1 VIEW COMPARISON:  07/14/2023 FINDINGS: Cardiac shadow is within normal limits. Aortic calcifications are noted. The lungs are well aerated bilaterally. No focal infiltrate or effusion is seen. No bony abnormality is noted. IMPRESSION: No active disease. Electronically Signed   By: Violeta Grey M.D.   On: 09/13/2023 03:18    Pertinent labs & imaging results that were available during my care of the patient were reviewed by me and considered in my medical decision making (see MDM for details).  Medications Ordered in ED Medications  sodium chloride 0.9 % bolus 1,000 mL (0 mLs Intravenous Stopped 09/13/23 0359)  insulin aspart (novoLOG) injection 10 Units (10 Units Intravenous Given 09/13/23 0402)                                                                                                                                     Procedures Procedures  (including critical care time)  Medical Decision Making / ED Course    Medical Decision Making:    BRIGGETT TUCCILLO is a 58 y.o. female with past medical history as below, significant for COPD, DM, htn, BP1D who presents to the  ED with complaint of fall, +glucose. . The complaint involves an extensive differential diagnosis and also carries with it a high risk of complications and morbidity.  Serious etiology was considered. Ddx includes but is not limited to: Hyperglycemia, DKA,  HHS, traumatic injury to the torso, contusion, pneumothorax, dehydration, etc.  Complete initial physical exam performed, notably the patient was in no distress, resting on stretcher.    Reviewed and confirmed nursing documentation for past medical history, family history, social history.  Vital signs reviewed.   Clinical Course as of 09/13/23 0723  Mon Sep 13, 2023  0233 pH, Ven: 7.395 DKA unlikely w/ normal pH [SG]  0255 Hemoglobin(!): 17.1 Chronic hemoconcentration  [SG]  0422 She has hyperglycemia, does not have DKA or HHS, give novolog/fluids and recheck.  [SG]  [id number] Feeling better, glucose has improved [SG]    Clinical Course User Index [SG] Sloan Leiter, DO    Brief summary: 58 year old female history as above here with trip and fall, hyperglycemia. Apparent mechanical fall prior to arrival, no external evidence of trauma.  She is ambulatory after the fall.  No evidence of head injury.  No thinners. Glucose elevated, check screening labs, give fluids.  Labs reviewed, potassium is normal, no evidence of DKA.  Will give NovoLog and recheck  Labs stable, imaging stable, she is feeling better, ambulatory without assistance.  Advised patient to pick up her prescriptions at the pharmacy, follow with her PCP.  Patient in no distress and overall condition improved here in the ED. Detailed discussions were had with the patient/guardian regarding current findings, and need for close f/u with PCP or on call doctor. The patient/guardian has been instructed to return immediately if the symptoms worsen in any way for re-evaluation. Patient/guardian verbalized understanding and is in agreement with current care plan. All questions answered prior to discharge.                Additional history obtained: -Additional history obtained from ems -External records from outside source obtained and reviewed including: Chart review including previous notes, labs, imaging, consultation  notes including  Prior ED visits, home medications   Lab Tests: -I ordered, reviewed, and interpreted labs.   The pertinent results include:   Labs Reviewed  CBC WITH DIFFERENTIAL/PLATELET - Abnormal; Notable for the following components:      Result Value   WBC 11.5 (*)    RBC 5.32 (*)    Hemoglobin 17.1 (*)    HCT 51.5 (*)    Neutro Abs 8.9 (*)    All other components within normal limits  BASIC METABOLIC PANEL WITH GFR - Abnormal; Notable for the following components:   Sodium 133 (*)    Chloride 97 (*)    Glucose, Bld 450 (*)    Calcium 8.6 (*)    All other components within normal limits  URINALYSIS, ROUTINE W REFLEX MICROSCOPIC - Abnormal; Notable for the following components:   Color, Urine STRAW (*)    Glucose, UA >=500 (*)    Ketones, ur 20 (*)    All other components within normal limits  BETA-HYDROXYBUTYRIC ACID - Abnormal; Notable for the following components:   Beta-Hydroxybutyric Acid 1.52 (*)    All other components within normal limits  COMPREHENSIVE METABOLIC PANEL WITH GFR - Abnormal; Notable for the following components:   Glucose, Bld 440 (*)    Calcium 8.6 (*)    Total Protein 5.9 (*)    All other components within normal limits  CBG MONITORING,  ED - Abnormal; Notable for the following components:   Glucose-Capillary 477 (*)    All other components within normal limits  I-STAT VENOUS BLOOD GAS, ED - Abnormal; Notable for the following components:   pO2, Ven 24 (*)    Bicarbonate 29.7 (*)    Acid-Base Excess 4.0 (*)    Calcium, Ion 1.08 (*)    HCT 51.0 (*)    Hemoglobin 17.3 (*)    All other components within normal limits  CBG MONITORING, ED - Abnormal; Notable for the following components:   Glucose-Capillary 386 (*)    All other components within normal limits  CBG MONITORING, ED - Abnormal; Notable for the following components:   Glucose-Capillary 263 (*)    All other components within normal limits  CBG MONITORING, ED - Abnormal; Notable  for the following components:   Glucose-Capillary 287 (*)    All other components within normal limits  CBG MONITORING, ED    Notable for hyperglycemia, hemoconcentration,  EKG   EKG Interpretation Date/Time:  Monday September 13 2023 02:19:16 EDT Ventricular Rate:  101 PR Interval:  192 QRS Duration:  96 QT Interval:  339 QTC Calculation: 440 R Axis:   75  Text Interpretation: Sinus tachycardia RAE, consider biatrial enlargement Probable left ventricular hypertrophy Anterior Q waves, possibly due to LVH Nonspecific T abnormalities, lateral leads similar to prior Confirmed by Tanda Rockers ([id number]) on 09/13/2023 2:42:50 AM         Imaging Studies ordered: I ordered imaging studies including chest x-ray I independently visualized the following imaging with scope of interpretation limited to determining acute life threatening conditions related to emergency care; findings noted above I independently visualized and interpreted imaging. I agree with the radiologist interpretation   Medicines ordered and prescription drug management: Meds ordered this encounter  Medications   sodium chloride 0.9 % bolus 1,000 mL   insulin aspart (novoLOG) injection 10 Units   DISCONTD: amLODipine (NORVASC) 5 MG tablet    Sig: Take 1 tablet (5 mg total) by mouth daily.    Dispense:  30 tablet    Refill:  0   DISCONTD: albuterol (VENTOLIN HFA) 108 (90 Base) MCG/ACT inhaler    Sig: Inhale 1-2 puffs into the lungs every 6 (six) hours as needed for wheezing or shortness of breath.    Dispense:  1 each    Refill:  0   amLODipine (NORVASC) 10 MG tablet    Sig: Take 1 tablet (10 mg total) by mouth daily.    Dispense:  30 tablet    Refill:  0   albuterol (VENTOLIN HFA) 108 (90 Base) MCG/ACT inhaler    Sig: Inhale 1-2 puffs into the lungs every 6 (six) hours as needed for wheezing or shortness of breath.    Dispense:  1 each    Refill:  0   acetaminophen (TYLENOL) 325 MG tablet    Sig: Take 2 tablets  (650 mg total) by mouth every 6 (six) hours as needed.    Dispense:  36 tablet    Refill:  0   ibuprofen (ADVIL) 600 MG tablet    Sig: Take 1 tablet (600 mg total) by mouth every 6 (six) hours as needed.    Dispense:  30 tablet    Refill:  0    -I have reviewed the patients home medicines and have made adjustments as needed   Consultations Obtained: Not applicable  Cardiac Monitoring: Continuous pulse oximetry interpreted by myself, 95% on RA.  Social Determinants of Health:  Diagnosis or treatment significantly limited by social determinants of health: former smoker and homelessness   Reevaluation: After the interventions noted above, I reevaluated the patient and found that they have improved  Co morbidities that complicate the patient evaluation  Past Medical History:  Diagnosis Date   Bipolar 1 disorder (HCC)    COPD (chronic obstructive pulmonary disease) (HCC)    Diabetes mellitus    Fibromyalgia    Grade I diastolic dysfunction 06/06/2023   Hypertension       Dispostion: Disposition decision including need for hospitalization was considered, and patient discharged from emergency department.    Final Clinical Impression(s) / ED Diagnoses Final diagnoses:  Homeless  Non compliance w medication regimen  Poorly controlled diabetes mellitus (HCC)        Teddi Favors, DO 09/13/23 [phone]

## 2023-09-13 NOTE — ED Notes (Signed)
 Patient was given a cup of water and diet Ginger ale.

## 2023-09-13 NOTE — Discharge Instructions (Addendum)
 It was a pleasure caring for you today in the emergency department.  Please take your medications, it looks like they were recently sent to the [facility], you can pick them up there  Please return to the emergency department for any worsening or worrisome symptoms.

## 2023-09-13 NOTE — ED Notes (Signed)
 Attempted to ambulate patient. Patient was only able to walk 5 feet before becoming increasingly distraught and teary eyed. Complaining of back pain.

## 2023-09-13 NOTE — ED Triage Notes (Signed)
 No insulin Type one diabetic Trying to get pcp Fell Hit left side 8/10 hurts on inspiration Aox4 No loc 174/92 102 24rr [id number] [id number] Patient is a a type one diabetic and has not been able to get any of her insulin. Is currently trying to get a PCP. Patient also reports to have fallen tonight landing on her left side. Stated that she felt dizzy before falling. No LOC. Aox4 8 out of 10 pain of left side with inspiration. EMS VS 174/92 BP 102 HR 24 RR 94% RA 358 CBG

## 2023-09-23 ENCOUNTER — Other Ambulatory Visit (HOSPITAL_COMMUNITY): Payer: Self-pay

## 2023-09-28 ENCOUNTER — Ambulatory Visit: Admitting: Nurse Practitioner

## 2023-11-04 ENCOUNTER — Ambulatory Visit: Payer: 59 | Admitting: Podiatry

## 2023-11-16 ENCOUNTER — Ambulatory Visit (HOSPITAL_COMMUNITY)
Admission: EM | Admit: 2023-11-16 | Discharge: 2023-11-16 | Discharge status: Against Medical Advice | Attending: Nurse Practitioner | Admitting: Nurse Practitioner

## 2023-11-16 DIAGNOSIS — E1039 Type 1 diabetes mellitus with other diabetic ophthalmic complication: Secondary | ICD-10-CM | POA: Insufficient documentation

## 2023-11-16 DIAGNOSIS — Z59812 Housing instability, housed, homelessness in past 12 months: Secondary | ICD-10-CM

## 2023-11-16 DIAGNOSIS — F4323 Adjustment disorder with mixed anxiety and depressed mood: Secondary | ICD-10-CM | POA: Diagnosis present

## 2023-11-16 DIAGNOSIS — R0902 Hypoxemia: Secondary | ICD-10-CM

## 2023-11-16 DIAGNOSIS — E1065 Type 1 diabetes mellitus with hyperglycemia: Secondary | ICD-10-CM

## 2023-11-16 DIAGNOSIS — R4589 Other symptoms and signs involving emotional state: Secondary | ICD-10-CM

## 2023-11-16 DIAGNOSIS — R54 Age-related physical debility: Secondary | ICD-10-CM | POA: Insufficient documentation

## 2023-11-16 DIAGNOSIS — I10 Essential (primary) hypertension: Secondary | ICD-10-CM | POA: Insufficient documentation

## 2023-11-16 LAB — GLUCOSE, CAPILLARY: Glucose-Capillary: 470 mg/dL — ABNORMAL HIGH (ref 70–99)

## 2023-11-16 NOTE — Progress Notes (Signed)
   11/16/23 1757  [facility] Triage Screening (Walk-ins at [facility] only)  How Did You Hear About Us ? Legal System  What Is the Reason for Your Visit/Call Today? Courtney Bennett presents to [facility] voluntarily via [employer]. Pt states that got put out of the home of her son's father. Pt contacted [employer] and asked them to bring here. Pt currently denies SI, HI, AVH and alcohol/drug use.  How Long Has This Been Causing You Problems? <Week  Have You Recently Had Any Thoughts About Hurting Yourself? No  Are You Planning to Commit Suicide/Harm Yourself At This time? No  Have you Recently Had Thoughts About Hurting Someone Courtney Bennett? No  Are You Planning To Harm Someone At This Time? No  Physical Abuse Denies  Verbal Abuse Denies  Sexual Abuse Denies  Exploitation of patient/patient's resources Denies  Self-Neglect Denies  Are you currently experiencing any auditory, visual or other hallucinations? No  Have You Used Any Alcohol or Drugs in the Past 24 Hours? No  Do you have any current medical co-morbidities that require immediate attention? No  What Do You Feel Would Help You the Most Today? Medication(s);Social Support;Housing Assistance  If access to [facility] was not available, would you have sought care in the Emergency Department? No  Determination of Need Routine (7 days)  Options For Referral Medication Management;Outpatient Therapy

## 2023-11-16 NOTE — Discharge Instructions (Addendum)
 The patient and/or responsible adult understands that the facility has offered any or all of the following: o   To examine the patient to determine whether or not an emergency medical condition is present. o   To provide stabilizing medical treatment for an emergency condition. o   To provide a medically appropriate transfer to another healthcare facility. o   To administer ongoing medical care.   The hospital personnel and/or physician have informed the patient of the benefits that might reasonably be expected from the offered services, the risks of refusing the offered services, and the risks of the offered services.    The patient and/or responsible adult appears to have the capacity to understand the risks and benefits as stated.  Alternative treatment options have also been discussed with the patient.   The patient and/or responsible adult refuses the offered services against medical advice and the refusal may result in a worsening condition including chronic disability or death.   The patient is welcome to return at any time and chooses to refuse the offered services.

## 2023-11-16 NOTE — ED Provider Notes (Signed)
 [facility] Medical Screening Exam  Patient Name: Courtney Bennett MRN: [id number] Date of Evaluation: 11/16/23 Chief Complaint: Medical problems. 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 female who presented to this [facility].  As per triage: Brendolyn Callas presents to [facility] voluntarily via [employer]. Pt states that got put out of the home of her son's father. Pt contacted [employer] and asked them to bring here. Pt currently denies SI, HI, AVH and alcohol/drug use.  Assessment: During encounter with patient, she appears cachectic, emaciated, appears to have scabs to bilateral arms, face appears reddened and flushed, patient hyperventilating, vital signs taken: Initial blood pressure with an adult size cough is in the 190s over low 100s, with O2 saturation in the 80s.  The child size curve utilized due to patient's arms being very emaciated, and difficult.  Blood pressure is 188/102, heart rate 111, oxygen applied via nasal cannula at 4 L and saturation is between 93 and 94% on room air.  Patient reports a history of high blood pressure, states that she has not taken her medications for over a month.  She is unable to recall what medications she currently takes.  Patient reports being a diabetic as well, blood glucose checked, and is 470.  Patient persistently asking for a disorder drink, but provided with water.  Patient educated that she will be transferred via EMS to the [facility], ER for further medical workup, treatment and stabilization, and states that she does not want to go to the [facility] ER, or to the [facility] ED. patient educated that it is imperative that she goes, and also educated on  the fact that EMS are required to take her to the closest ER, but persistently denies.  EMS is called, patient works outside by the EMS truck, picked out a cigarette from her back and proceeds to light it, prior to being ushered away from the EMS truck. She persists to carry her bags and walk away even though she has been educated on the dangers of not getting medical care, including death, but proceeds to walk away and refuses to sign AMA paper work. She walks away without getting on the EMS truck.  Prior to walking way, had denied SI/HI/AVH, denied paranoia, denied delusional thinking, stated that she did not have any mental health problems, and had been at her ex-husband's house trying to get him to take care of himself because he is a  Paranoid schizophrenia,when the police came and apprehended her and brought her here.   The patient understands that the facility has offered all of the following: o   To examine the patient to determine whether or not an emergency medical condition is present. o   To provide stabilizing medical treatment for an emergency condition. o   To provide a medically appropriate transfer to another healthcare facility. o   To administer ongoing medical care.   The hospital personnel and/or physician have informed the patient of the benefits that might reasonably be expected from the offered services, the risks of refusing the offered services, and the risks of the offered services.    The patient and/or responsible adult appears to have the capacity to understand the risks and benefits as  stated.  Alternative treatment options have also been discussed with the patient.   The patient and/or responsible adult refuses the offered services against medical advice and the refusal may result in a worsening condition including chronic disability or death.   The patient is welcome to return at any time and chooses to refuse the offered services. Patient educated on the above and  also on the fact that failure to get medical services could lead to further complications including death and has verbalized understanding, and releases the hospital and all employees of any liabilities should she have any medical complications.  Flowsheet Row ED from 11/16/2023 in [facility] ED from 09/13/2023 in [facility] Emergency Department at [facility] ED from 08/28/2023 in [facility] Emergency Department at [facility]  C-SSRS RISK CATEGORY No Risk Low Risk Low Risk    Psychiatric Specialty Exam  Presentation  General Appearance:Disheveled  Eye Contact:Minimal  Speech:Slow  Speech Volume:Decreased  Handedness:Right   Mood and Affect  Mood: Anxious; Depressed  Affect: Congruent   Thought Process  Thought Processes: Linear  Descriptions of Associations:Intact  Orientation:Full (Time, Place and Person)  Thought Content:Logical    Hallucinations:None  Ideas of Reference:None  Suicidal Thoughts:No  Homicidal Thoughts:No   Sensorium  Memory: Immediate Fair  Judgment: Fair  Insight: Fair   Art therapist  Concentration: Fair  Attention Span: Fair  Recall: [employer] of Knowledge: Fair  Language: Fair   Psychomotor Activity  Psychomotor Activity: Decreased   Assets  Assets: Resilience   Sleep  Sleep: Poor  Number of hours: No data recorded  Physical Exam: Physical Exam Constitutional:      Appearance: She is ill-appearing.   Cardiovascular:     Rate and Rhythm: Tachycardia present.  Pulmonary:     Effort: Respiratory distress present.   Musculoskeletal:     Cervical back: Normal range of motion.   Neurological:     Mental Status: She is alert.    Review of Systems  Psychiatric/Behavioral:  Positive for depression and substance abuse. Negative for hallucinations, memory loss and suicidal ideas. The patient is nervous/anxious. The patient does not have insomnia.    All other systems reviewed and are negative.  Blood pressure (!) 188/102, pulse (!) 111, temperature 98.3 F (36.8 C), resp. rate 20, last menstrual period 11/24/2014, SpO2 95%. There is no height or weight on file to calculate BMI.  Musculoskeletal: Strength & Muscle Tone: decreased Gait & Station: unsteady Patient leans: N/A   [facility] MSE Discharge Disposition for Follow up and Recommendations: Based on my evaluation the patient appears to have an emergency medical condition for which I recommend the patient be transferred to the emergency department for further evaluation.   Refused to comply with above recommendations and left AMA. Robet Chiquito, NP 11/16/2023, 6:50 PM

## 2023-11-16 NOTE — ED Notes (Signed)
 Patient left AMA after EMS was called to unit for transport for medical clearance. Patient A&O x4, ambulatory with a steady gait.

## 2023-12-28 NOTE — ED Notes (Signed)
 Note entered at 1247 - Pt discharged and asked to uses phone - sat at nurses station on the phone for 10-15 mins - reminded she was discharged and needed to go to the lobby to use that phone - discharge has been gone over with pt by another nurse - back to room and kept messing with her belongings - informed we needed to get that room clean and that she had been discharged and needs to leave - security called and to room - pt still stalling - finally pt was escorted to lobby with security - she was ambulatory to lobby without difficulty - bottle of calamine lotion found in trash in case she comes back about it -

## 2023-12-28 NOTE — Progress Notes (Signed)
 [facility] Case Management     Patient:   Courtney Bennett MR Number:  [id number] Patient Date of Birth: 10-Sep-1965 Age/Sex:  58 y.o./female     SW received consult from Psych regarding homelessness/housing concerns and med assistance.  Pt has Medicare and Medicaid that will cover cost of meds.  SW unable to help with voucher as pt has coverage.  SW provided pt with housing resources that will be attached to AVS.  Pt has reported homelessness since at least 2023.  No additional needs noted at this time.    Electronically signed: Powell LITTIE Fujita, MSW 12/28/2023 / 10:41 AM

## 2023-12-28 NOTE — ED Provider Notes (Signed)
 [facility]  ED EXTENDED STAY PROGRESS NOTE   Date & Time of assessment: 12/28/2023  6:11 AM  Current Vital Signs: Vitals:   12/28/23 0206  BP: (!) 159/70  Pulse: 67  Resp: 16  Temp: 97.8 F (36.6 C)  SpO2: 92%    ED Medications: Medications  insulin  regular (HUMULIN R ,NOVOLIN R) 100 units/mL injection 5 Units (5 Units Subcutaneous Refused 12/27/23 1625)  amLODIPine  besylate (NORVASC ) tablet 10 mg (10 mg Oral Refused 12/27/23 1626)  aspirin  (ECOTRIN LOW DOSE) EC tablet 81 mg (81 mg Oral Refused 12/27/23 1626)  atorvastatin  calcium  (LIPITOR) tablet 10 mg (10 mg Oral Refused 12/27/23 2102)  empagliflozin (JARDIANCE) tablet 25 mg (25 mg Oral Refused 12/27/23 1626)  umeclidinium bromide  (INCRUSE ELLIPTA ) 62.5 mcg/inh inhalation powder 1 puff (has no administration in time range)  insulin  glargine (LANTUS ) injection 8 Units (8 Units Subcutaneous Given 12/27/23 2052)  losartan  potassium (COZAAR ) tablet 100 mg (100 mg Oral Refused 12/27/23 1626)  pantoprazole  sodium (PROTONIX ) EC tablet 40 mg (40 mg Oral Refused 12/27/23 1627)  albuterol  sulfate HFA 90 mcg/puff inhaler (has no administration in time range)  LORazepam  (ATIVAN ) tablet 1 mg (has no administration in time range)  escitalopram oxalate (LEXAPRO) tablet 10 mg (10 mg Oral Refused 12/27/23 2102)  insulin  lispro (HUMALOG ,ADMELOG ) 100 Units/mL injection 3 Units (3 Units Subcutaneous Given 12/27/23 1845)  dextrose  (GLUTOSE/SWEET CHEEKS) 40% oral gel 15-30 g (has no administration in time range)    Or  dextrose  injection 12.5 g (has no administration in time range)    Or  dextrose  injection 25 g (has no administration in time range)    Or  glucagon  injection 1 mg (has no administration in time range)  losartan  potassium (COZAAR ) tablet 100 mg (100 mg Oral Refused 12/27/23 2102)    Interval Assessment: Remains medically stable, without complaint  Plan: Voluntary psychiatric admission, awaiting bed  placement  Final diagnoses:  Severe episode of recurrent major depressive disorder, without psychotic features (*)  Type 2 diabetes mellitus with hyperglycemia, with long-term current use of insulin  (*)  Elevated blood pressure reading with diagnosis of hypertension     Electronically signed by:   Ardean JINNY Molt, MD 12/28/23 [phone]

## 2023-12-28 NOTE — ED Provider Notes (Signed)
 [facility]  ED EXTENDED STAY DISCHARGE NOTE  Disposition Date & Time: 12/28/2023    11:08 AM  Current Vital Signs: Vitals:   12/28/23 1008  BP: 144/76  Pulse: 71  Resp: 17  Temp: 98.3 F (36.8 C)  SpO2: 94%    Summary of Care: Patient presented with hyperglycemia and suicidal ideation.  She is medically cleared evaluated by behavioral and psychiatry recommended discharge.  She did speak with social work prior to being discharged.  Discharge Plan: Discharge Observation discharge day management today: > 30 minutes   Diagnosis: Final diagnoses:  Severe episode of recurrent major depressive disorder, without psychotic features (*)  Type 2 diabetes mellitus with hyperglycemia, with long-term current use of insulin  (*)  Elevated blood pressure reading with diagnosis of hypertension    Disposition: ED Disposition     ED Disposition  Discharge   Condition  Stable   Comment  --         Electronically signed by:   Ardean JINNY Molt, MD 12/28/23 1109

## 2023-12-29 NOTE — Progress Notes (Signed)
 12/29/23 HN received notification that patient was discharged from [facility] on 12/28/23 for major depressive disorder, recurrent severe without psychotic features.   Per chart review patient is no longer following [facility] providers at this time. The last time spoke with patient she said that she is no longer following with our provider and requested to no longer call her on 03/31/23. No further HN services warranted at this time per [facility] protocol    Jon Sharps, RN [employer] [phone]

## 2024-03-03 ENCOUNTER — Telehealth (HOSPITAL_COMMUNITY): Payer: Self-pay

## 2024-03-03 NOTE — Telephone Encounter (Signed)
 Pt's chart was accessed due to pt being on a backlogged list of patients needing assistance with PCP placement.  Per chart review, pt has since been a NO-show for a set up appt with [facility] PCP. It appears she may be followed by [facility]. No further action taken at this time.

## 2024-03-22 ENCOUNTER — Other Ambulatory Visit: Payer: Self-pay

## 2024-03-22 DIAGNOSIS — Z1231 Encounter for screening mammogram for malignant neoplasm of breast: Secondary | ICD-10-CM

## 2024-04-11 ENCOUNTER — Ambulatory Visit

## 2024-05-01 ENCOUNTER — Ambulatory Visit

## 2024-05-04 ENCOUNTER — Ambulatory Visit
